# Patient Record
Sex: Male | Born: 1935 | Race: White | Hispanic: No | Smoking: Former smoker
Health system: Southern US, Community
[De-identification: ages and names within clinical notes are randomized; demographics above are authoritative.]

## PROBLEM LIST (undated history)

## (undated) DIAGNOSIS — N3941 Urge incontinence: Secondary | ICD-10-CM

## (undated) DIAGNOSIS — F1011 Alcohol abuse, in remission: Secondary | ICD-10-CM

## (undated) DIAGNOSIS — M109 Gout, unspecified: Secondary | ICD-10-CM

## (undated) DIAGNOSIS — N400 Enlarged prostate without lower urinary tract symptoms: Secondary | ICD-10-CM

## (undated) DIAGNOSIS — M5431 Sciatica, right side: Secondary | ICD-10-CM

## (undated) DIAGNOSIS — H9190 Unspecified hearing loss, unspecified ear: Secondary | ICD-10-CM

## (undated) DIAGNOSIS — K5730 Diverticulosis of large intestine without perforation or abscess without bleeding: Secondary | ICD-10-CM

## (undated) DIAGNOSIS — H3530 Unspecified macular degeneration: Secondary | ICD-10-CM

## (undated) DIAGNOSIS — I10 Essential (primary) hypertension: Secondary | ICD-10-CM

## (undated) DIAGNOSIS — Z9889 Other specified postprocedural states: Secondary | ICD-10-CM

## (undated) DIAGNOSIS — M1990 Unspecified osteoarthritis, unspecified site: Secondary | ICD-10-CM

## (undated) DIAGNOSIS — R195 Other fecal abnormalities: Secondary | ICD-10-CM

## (undated) DIAGNOSIS — Z972 Presence of dental prosthetic device (complete) (partial): Secondary | ICD-10-CM

## (undated) DIAGNOSIS — Z8739 Personal history of other diseases of the musculoskeletal system and connective tissue: Secondary | ICD-10-CM

## (undated) DIAGNOSIS — N289 Disorder of kidney and ureter, unspecified: Secondary | ICD-10-CM

## (undated) DIAGNOSIS — N503 Cyst of epididymis: Secondary | ICD-10-CM

## (undated) DIAGNOSIS — Z85048 Personal history of other malignant neoplasm of rectum, rectosigmoid junction, and anus: Secondary | ICD-10-CM

## (undated) DIAGNOSIS — E785 Hyperlipidemia, unspecified: Secondary | ICD-10-CM

## (undated) DIAGNOSIS — Z8719 Personal history of other diseases of the digestive system: Secondary | ICD-10-CM

## (undated) DIAGNOSIS — I482 Chronic atrial fibrillation, unspecified: Secondary | ICD-10-CM

## (undated) DIAGNOSIS — Z859 Personal history of malignant neoplasm, unspecified: Secondary | ICD-10-CM

## (undated) DIAGNOSIS — K08109 Complete loss of teeth, unspecified cause, unspecified class: Secondary | ICD-10-CM

## (undated) DIAGNOSIS — I4510 Unspecified right bundle-branch block: Secondary | ICD-10-CM

## (undated) DIAGNOSIS — Z85820 Personal history of malignant melanoma of skin: Secondary | ICD-10-CM

## (undated) DIAGNOSIS — L271 Localized skin eruption due to drugs and medicaments taken internally: Secondary | ICD-10-CM

## (undated) DIAGNOSIS — J449 Chronic obstructive pulmonary disease, unspecified: Secondary | ICD-10-CM

## (undated) DIAGNOSIS — R21 Rash and other nonspecific skin eruption: Secondary | ICD-10-CM

## (undated) HISTORY — PX: COLONOSCOPY: SHX174

## (undated) HISTORY — PX: CATARACT EXTRACTION W/ INTRAOCULAR LENS  IMPLANT, BILATERAL: SHX1307

## (undated) HISTORY — DX: Alcohol abuse, in remission: F10.11

## (undated) HISTORY — DX: Gout, unspecified: M10.9

## (undated) HISTORY — PX: OTHER SURGICAL HISTORY: SHX169

## (undated) HISTORY — PX: CARDIAC CATHETERIZATION: SHX172

## (undated) HISTORY — DX: Benign prostatic hyperplasia without lower urinary tract symptoms: N40.0

## (undated) HISTORY — DX: Personal history of other diseases of the digestive system: Z87.19

## (undated) HISTORY — PX: TONSILLECTOMY: SUR1361

## (undated) HISTORY — DX: Rash and other nonspecific skin eruption: R21

---

## 1988-12-19 HISTORY — PX: LAPAROSCOPIC CHOLECYSTECTOMY: SUR755

## 2002-02-06 ENCOUNTER — Encounter: Payer: Self-pay | Admitting: Internal Medicine

## 2004-12-09 ENCOUNTER — Ambulatory Visit: Payer: Self-pay | Admitting: Internal Medicine

## 2005-01-13 ENCOUNTER — Ambulatory Visit: Payer: Self-pay | Admitting: Internal Medicine

## 2005-01-20 ENCOUNTER — Encounter: Admission: RE | Admit: 2005-01-20 | Discharge: 2005-01-20 | Payer: Self-pay | Admitting: Internal Medicine

## 2005-02-26 ENCOUNTER — Ambulatory Visit: Payer: Self-pay | Admitting: Internal Medicine

## 2005-04-17 ENCOUNTER — Ambulatory Visit: Payer: Self-pay | Admitting: Family Medicine

## 2005-05-05 ENCOUNTER — Ambulatory Visit: Payer: Self-pay | Admitting: Internal Medicine

## 2006-01-21 ENCOUNTER — Ambulatory Visit: Payer: Self-pay | Admitting: Internal Medicine

## 2006-05-27 ENCOUNTER — Ambulatory Visit: Payer: Self-pay | Admitting: Internal Medicine

## 2006-11-09 ENCOUNTER — Telehealth: Payer: Self-pay | Admitting: *Deleted

## 2006-12-07 DIAGNOSIS — R32 Unspecified urinary incontinence: Secondary | ICD-10-CM | POA: Insufficient documentation

## 2006-12-07 DIAGNOSIS — Z8719 Personal history of other diseases of the digestive system: Secondary | ICD-10-CM | POA: Insufficient documentation

## 2006-12-07 DIAGNOSIS — I10 Essential (primary) hypertension: Secondary | ICD-10-CM

## 2006-12-07 DIAGNOSIS — N400 Enlarged prostate without lower urinary tract symptoms: Secondary | ICD-10-CM

## 2006-12-07 DIAGNOSIS — J449 Chronic obstructive pulmonary disease, unspecified: Secondary | ICD-10-CM

## 2006-12-07 DIAGNOSIS — K279 Peptic ulcer, site unspecified, unspecified as acute or chronic, without hemorrhage or perforation: Secondary | ICD-10-CM | POA: Insufficient documentation

## 2006-12-08 ENCOUNTER — Telehealth: Payer: Self-pay | Admitting: *Deleted

## 2007-02-09 ENCOUNTER — Ambulatory Visit: Payer: Self-pay | Admitting: Internal Medicine

## 2007-02-09 DIAGNOSIS — M1990 Unspecified osteoarthritis, unspecified site: Secondary | ICD-10-CM

## 2007-02-09 DIAGNOSIS — E785 Hyperlipidemia, unspecified: Secondary | ICD-10-CM | POA: Insufficient documentation

## 2007-06-23 ENCOUNTER — Ambulatory Visit: Payer: Self-pay | Admitting: Internal Medicine

## 2007-06-23 DIAGNOSIS — Z87891 Personal history of nicotine dependence: Secondary | ICD-10-CM | POA: Insufficient documentation

## 2007-09-13 ENCOUNTER — Emergency Department (HOSPITAL_COMMUNITY): Admission: EM | Admit: 2007-09-13 | Discharge: 2007-09-13 | Payer: Self-pay | Admitting: Emergency Medicine

## 2007-09-28 ENCOUNTER — Ambulatory Visit: Payer: Self-pay | Admitting: Internal Medicine

## 2007-09-28 LAB — CONVERTED CEMR LAB
Cholesterol: 195 mg/dL (ref 0–200)
Direct LDL: 147.3 mg/dL
HDL: 27.4 mg/dL — ABNORMAL LOW (ref >39.0)

## 2007-10-04 ENCOUNTER — Ambulatory Visit: Payer: Self-pay | Admitting: Internal Medicine

## 2007-11-16 ENCOUNTER — Ambulatory Visit: Payer: Self-pay | Admitting: Internal Medicine

## 2007-11-16 DIAGNOSIS — K921 Melena: Secondary | ICD-10-CM | POA: Insufficient documentation

## 2007-11-16 LAB — CONVERTED CEMR LAB
BUN: 13 mg/dL (ref 6–23)
Basophils Absolute: 0 10*3/uL (ref 0.0–0.1)
Basophils Relative: 0.1 % (ref 0.0–3.0)
CEA: 5 ng/mL (ref 0.0–5.0)
CO2: 33 meq/L — ABNORMAL HIGH (ref 19–32)
Calcium: 9.9 mg/dL (ref 8.4–10.5)
Chloride: 97 meq/L (ref 96–112)
Creatinine, Ser: 1.3 mg/dL (ref 0.4–1.5)
Eosinophils Absolute: 0.2 10*3/uL (ref 0.0–0.7)
Eosinophils Relative: 3.1 % (ref 0.0–5.0)
GFR calc Af Amer: 68 mL/min
GFR calc non Af Amer: 56 mL/min
Glucose, Bld: 149 mg/dL — ABNORMAL HIGH (ref 70–99)
HCT: 46.7 % (ref 39.0–52.0)
Hemoglobin: 16 g/dL (ref 13.0–17.0)
Lymphocytes Relative: 24.1 % (ref 12.0–46.0)
MCHC: 34.3 g/dL (ref 30.0–36.0)
MCV: 89 fL (ref 78.0–100.0)
Monocytes Absolute: 0.8 10*3/uL (ref 0.1–1.0)
Monocytes Relative: 13.2 % — ABNORMAL HIGH (ref 3.0–12.0)
Neutro Abs: 3.3 10*3/uL (ref 1.4–7.7)
Neutrophils Relative %: 59.5 % (ref 43.0–77.0)
Platelets: 160 10*3/uL (ref 150–400)
Potassium: 4.6 meq/L (ref 3.5–5.1)
RBC: 5.25 M/uL (ref 4.22–5.81)
RDW: 13.2 % (ref 11.5–14.6)
Sodium: 139 meq/L (ref 135–145)
WBC: 5.7 10*3/uL (ref 4.5–10.5)

## 2007-11-30 ENCOUNTER — Ambulatory Visit: Payer: Self-pay | Admitting: Internal Medicine

## 2007-11-30 LAB — CONVERTED CEMR LAB
Basophils Absolute: 0 10*3/uL (ref 0.0–0.1)
Basophils Relative: 0.2 % (ref 0.0–3.0)
Eosinophils Absolute: 0.2 10*3/uL (ref 0.0–0.7)
Eosinophils Relative: 1.8 % (ref 0.0–5.0)
HCT: 47.1 % (ref 39.0–52.0)
Hemoglobin: 16 g/dL (ref 13.0–17.0)
Lymphocytes Relative: 15.9 % (ref 12.0–46.0)
MCHC: 34.1 g/dL (ref 30.0–36.0)
MCV: 90.2 fL (ref 78.0–100.0)
Monocytes Absolute: 1.1 10*3/uL — ABNORMAL HIGH (ref 0.1–1.0)
Monocytes Relative: 11.9 % (ref 3.0–12.0)
Neutro Abs: 6.4 10*3/uL (ref 1.4–7.7)
Neutrophils Relative %: 70.2 % (ref 43.0–77.0)
Platelets: 158 10*3/uL (ref 150–400)
RBC: 5.22 M/uL (ref 4.22–5.81)
RDW: 13.2 % (ref 11.5–14.6)
WBC: 9.1 10*3/uL (ref 4.5–10.5)

## 2007-12-19 ENCOUNTER — Ambulatory Visit: Payer: Self-pay | Admitting: Gastroenterology

## 2007-12-19 DIAGNOSIS — R197 Diarrhea, unspecified: Secondary | ICD-10-CM | POA: Insufficient documentation

## 2007-12-19 DIAGNOSIS — K625 Hemorrhage of anus and rectum: Secondary | ICD-10-CM | POA: Insufficient documentation

## 2008-01-02 ENCOUNTER — Ambulatory Visit: Payer: Self-pay | Admitting: Gastroenterology

## 2008-01-02 ENCOUNTER — Encounter: Payer: Self-pay | Admitting: Gastroenterology

## 2008-01-03 ENCOUNTER — Ambulatory Visit: Payer: Self-pay | Admitting: Cardiology

## 2008-01-03 DIAGNOSIS — N289 Disorder of kidney and ureter, unspecified: Secondary | ICD-10-CM | POA: Insufficient documentation

## 2008-01-04 ENCOUNTER — Ambulatory Visit: Payer: Self-pay | Admitting: Oncology

## 2008-01-04 ENCOUNTER — Ambulatory Visit: Payer: Self-pay | Admitting: Internal Medicine

## 2008-01-04 ENCOUNTER — Encounter: Payer: Self-pay | Admitting: Gastroenterology

## 2008-01-04 DIAGNOSIS — C19 Malignant neoplasm of rectosigmoid junction: Secondary | ICD-10-CM

## 2008-01-05 ENCOUNTER — Encounter: Payer: Self-pay | Admitting: Gastroenterology

## 2008-01-05 ENCOUNTER — Encounter: Payer: Self-pay | Admitting: Internal Medicine

## 2008-01-06 ENCOUNTER — Encounter: Payer: Self-pay | Admitting: Internal Medicine

## 2008-01-06 ENCOUNTER — Ambulatory Visit: Payer: Self-pay | Admitting: Gastroenterology

## 2008-01-12 ENCOUNTER — Ambulatory Visit: Payer: Self-pay | Admitting: Gastroenterology

## 2008-01-12 ENCOUNTER — Ambulatory Visit (HOSPITAL_COMMUNITY): Admission: RE | Admit: 2008-01-12 | Discharge: 2008-01-12 | Payer: Self-pay | Admitting: Gastroenterology

## 2008-01-16 ENCOUNTER — Encounter: Payer: Self-pay | Admitting: Internal Medicine

## 2008-01-18 ENCOUNTER — Encounter: Payer: Self-pay | Admitting: Internal Medicine

## 2008-01-18 ENCOUNTER — Encounter: Payer: Self-pay | Admitting: Gastroenterology

## 2008-01-19 ENCOUNTER — Encounter: Payer: Self-pay | Admitting: Internal Medicine

## 2008-01-20 ENCOUNTER — Ambulatory Visit: Admission: RE | Admit: 2008-01-20 | Discharge: 2008-03-14 | Payer: Self-pay | Admitting: Radiation Oncology

## 2008-01-24 LAB — COMPREHENSIVE METABOLIC PANEL
ALT: 12 U/L (ref 0–53)
Albumin: 4.5 g/dL (ref 3.5–5.2)
Alkaline Phosphatase: 69 U/L (ref 39–117)
Glucose, Bld: 87 mg/dL (ref 70–99)
Potassium: 4.1 mEq/L (ref 3.5–5.3)
Sodium: 136 mEq/L (ref 135–145)
Total Protein: 7.1 g/dL (ref 6.0–8.3)

## 2008-01-24 LAB — CBC WITH DIFFERENTIAL/PLATELET
BASO%: 0.3 % (ref 0.0–2.0)
Eosinophils Absolute: 0.1 10*3/uL (ref 0.0–0.5)
MCHC: 34.3 g/dL (ref 32.0–35.9)
MCV: 88 fL (ref 81.6–98.0)
MONO#: 0.8 10*3/uL (ref 0.1–0.9)
MONO%: 11.2 % (ref 0.0–13.0)
NEUT#: 5 10*3/uL (ref 1.5–6.5)
RBC: 5.2 10*6/uL (ref 4.20–5.71)
RDW: 14.4 % (ref 11.2–14.6)
WBC: 7.1 10*3/uL (ref 4.0–10.0)

## 2008-01-31 LAB — CBC WITH DIFFERENTIAL/PLATELET
BASO%: 0.5 % (ref 0.0–2.0)
Basophils Absolute: 0 10e3/uL (ref 0.0–0.1)
EOS%: 1.4 % (ref 0.0–7.0)
Eosinophils Absolute: 0.1 10e3/uL (ref 0.0–0.5)
HCT: 43.7 % (ref 38.7–49.9)
HGB: 15 g/dL (ref 13.0–17.1)
LYMPH%: 14.9 % (ref 14.0–48.0)
MCH: 30.3 pg (ref 28.0–33.4)
MCHC: 34.3 g/dL (ref 32.0–35.9)
MCV: 88.5 fL (ref 81.6–98.0)
MONO#: 0.7 10e3/uL (ref 0.1–0.9)
MONO%: 11 % (ref 0.0–13.0)
NEUT#: 4.3 10e3/uL (ref 1.5–6.5)
NEUT%: 72.2 % (ref 40.0–75.0)
Platelets: 141 10e3/uL — ABNORMAL LOW (ref 145–400)
RBC: 4.93 10e6/uL (ref 4.20–5.71)
RDW: 14.5 % (ref 11.2–14.6)
WBC: 6 10e3/uL (ref 4.0–10.0)
lymph#: 0.9 10e3/uL (ref 0.9–3.3)

## 2008-02-07 LAB — CBC WITH DIFFERENTIAL/PLATELET
BASO%: 0.5 % (ref 0.0–2.0)
Eosinophils Absolute: 0.1 10*3/uL (ref 0.0–0.5)
LYMPH%: 13 % — ABNORMAL LOW (ref 14.0–48.0)
MCHC: 34.6 g/dL (ref 32.0–35.9)
MONO#: 0.5 10*3/uL (ref 0.1–0.9)
NEUT#: 3.9 10*3/uL (ref 1.5–6.5)
RBC: 4.91 10*6/uL (ref 4.20–5.71)
RDW: 14 % (ref 11.2–14.6)
WBC: 5.2 10*3/uL (ref 4.0–10.0)
lymph#: 0.7 10*3/uL — ABNORMAL LOW (ref 0.9–3.3)

## 2008-02-08 ENCOUNTER — Encounter: Payer: Self-pay | Admitting: Internal Medicine

## 2008-02-15 LAB — CBC WITH DIFFERENTIAL/PLATELET
BASO%: 0.2 % (ref 0.0–2.0)
Eosinophils Absolute: 0.2 10*3/uL (ref 0.0–0.5)
HCT: 42.1 % (ref 38.7–49.9)
LYMPH%: 8.4 % — ABNORMAL LOW (ref 14.0–48.0)
MCHC: 34.3 g/dL (ref 32.0–35.9)
MONO#: 0.6 10*3/uL (ref 0.1–0.9)
NEUT#: 4.2 10*3/uL (ref 1.5–6.5)
NEUT%: 75.9 % — ABNORMAL HIGH (ref 40.0–75.0)
Platelets: 118 10*3/uL — ABNORMAL LOW (ref 145–400)
WBC: 5.6 10*3/uL (ref 4.0–10.0)
lymph#: 0.5 10*3/uL — ABNORMAL LOW (ref 0.9–3.3)

## 2008-02-21 ENCOUNTER — Ambulatory Visit: Payer: Self-pay | Admitting: Oncology

## 2008-02-21 LAB — CBC WITH DIFFERENTIAL/PLATELET
Basophils Absolute: 0 10*3/uL (ref 0.0–0.1)
EOS%: 7.9 % — ABNORMAL HIGH (ref 0.0–7.0)
HCT: 41.2 % (ref 38.7–49.9)
HGB: 14.1 g/dL (ref 13.0–17.1)
MCH: 30.9 pg (ref 28.0–33.4)
NEUT%: 74 % (ref 40.0–75.0)
lymph#: 0.4 10*3/uL — ABNORMAL LOW (ref 0.9–3.3)

## 2008-02-23 ENCOUNTER — Encounter: Payer: Self-pay | Admitting: Internal Medicine

## 2008-02-28 LAB — CBC WITH DIFFERENTIAL/PLATELET
Basophils Absolute: 0 10*3/uL (ref 0.0–0.1)
EOS%: 5.4 % (ref 0.0–7.0)
HCT: 38.6 % — ABNORMAL LOW (ref 38.7–49.9)
HGB: 13.2 g/dL (ref 13.0–17.1)
LYMPH%: 5.2 % — ABNORMAL LOW (ref 14.0–48.0)
MCH: 31.5 pg (ref 28.0–33.4)
MCV: 92.2 fL (ref 81.6–98.0)
MONO%: 13.4 % — ABNORMAL HIGH (ref 0.0–13.0)
NEUT%: 76 % — ABNORMAL HIGH (ref 40.0–75.0)
Platelets: 141 10*3/uL — ABNORMAL LOW (ref 145–400)
RDW: 15.4 % — ABNORMAL HIGH (ref 11.2–14.6)

## 2008-03-01 ENCOUNTER — Encounter: Payer: Self-pay | Admitting: Internal Medicine

## 2008-03-08 ENCOUNTER — Encounter: Payer: Self-pay | Admitting: Internal Medicine

## 2008-03-08 LAB — CBC WITH DIFFERENTIAL/PLATELET
EOS%: 3.6 % (ref 0.0–7.0)
MCH: 31.1 pg (ref 28.0–33.4)
MCHC: 33.8 g/dL (ref 32.0–35.9)
MCV: 92 fL (ref 81.6–98.0)
MONO%: 12.6 % (ref 0.0–13.0)
RBC: 4.22 10*6/uL (ref 4.20–5.71)
RDW: 17.5 % — ABNORMAL HIGH (ref 11.2–14.6)

## 2008-04-18 ENCOUNTER — Inpatient Hospital Stay (HOSPITAL_COMMUNITY): Admission: RE | Admit: 2008-04-18 | Discharge: 2008-04-24 | Payer: Self-pay | Admitting: General Surgery

## 2008-04-18 ENCOUNTER — Encounter (INDEPENDENT_AMBULATORY_CARE_PROVIDER_SITE_OTHER): Payer: Self-pay | Admitting: General Surgery

## 2008-05-03 ENCOUNTER — Encounter: Payer: Self-pay | Admitting: Gastroenterology

## 2008-05-04 ENCOUNTER — Ambulatory Visit: Payer: Self-pay | Admitting: Oncology

## 2008-05-08 ENCOUNTER — Ambulatory Visit: Payer: Self-pay | Admitting: Internal Medicine

## 2008-05-11 ENCOUNTER — Encounter: Payer: Self-pay | Admitting: Internal Medicine

## 2008-05-11 ENCOUNTER — Ambulatory Visit: Payer: Self-pay

## 2008-05-11 HISTORY — PX: TRANSTHORACIC ECHOCARDIOGRAM: SHX275

## 2008-05-15 ENCOUNTER — Telehealth: Payer: Self-pay | Admitting: Internal Medicine

## 2008-06-04 ENCOUNTER — Encounter: Payer: Self-pay | Admitting: Internal Medicine

## 2008-06-04 LAB — CBC WITH DIFFERENTIAL/PLATELET
Basophils Absolute: 0 10*3/uL (ref 0.0–0.1)
EOS%: 1.8 % (ref 0.0–7.0)
HCT: 37.4 % — ABNORMAL LOW (ref 38.7–49.9)
HGB: 12.6 g/dL — ABNORMAL LOW (ref 13.0–17.1)
MCH: 30.1 pg (ref 28.0–33.4)
MCHC: 33.7 g/dL (ref 32.0–35.9)
MCV: 89.4 fL (ref 81.6–98.0)
MONO%: 12.1 % (ref 0.0–13.0)
NEUT%: 63.1 % (ref 40.0–75.0)
RDW: 15.4 % — ABNORMAL HIGH (ref 11.2–14.6)

## 2008-06-04 LAB — COMPREHENSIVE METABOLIC PANEL
AST: 12 U/L (ref 0–37)
Alkaline Phosphatase: 60 U/L (ref 39–117)
BUN: 16 mg/dL (ref 6–23)
Creatinine, Ser: 1.06 mg/dL (ref 0.40–1.50)

## 2008-06-07 ENCOUNTER — Encounter: Payer: Self-pay | Admitting: Internal Medicine

## 2008-07-04 ENCOUNTER — Ambulatory Visit: Payer: Self-pay | Admitting: Oncology

## 2008-07-06 ENCOUNTER — Encounter: Payer: Self-pay | Admitting: Internal Medicine

## 2008-07-06 LAB — CBC WITH DIFFERENTIAL/PLATELET
Basophils Absolute: 0 10*3/uL (ref 0.0–0.1)
EOS%: 2.6 % (ref 0.0–7.0)
HCT: 39.1 % (ref 38.4–49.9)
HGB: 13.3 g/dL (ref 13.0–17.1)
LYMPH%: 22.7 % (ref 14.0–49.0)
MCH: 30 pg (ref 27.2–33.4)
NEUT%: 64.6 % (ref 39.0–75.0)
Platelets: 135 10*3/uL — ABNORMAL LOW (ref 140–400)
lymph#: 1.1 10*3/uL (ref 0.9–3.3)

## 2008-07-19 ENCOUNTER — Encounter: Payer: Self-pay | Admitting: Internal Medicine

## 2008-07-19 LAB — CBC WITH DIFFERENTIAL/PLATELET
BASO%: 0.7 % (ref 0.0–2.0)
Eosinophils Absolute: 0.1 10*3/uL (ref 0.0–0.5)
MCHC: 34.2 g/dL (ref 32.0–36.0)
MONO#: 0.6 10*3/uL (ref 0.1–0.9)
NEUT#: 2.9 10*3/uL (ref 1.5–6.5)
Platelets: 132 10*3/uL — ABNORMAL LOW (ref 140–400)
RBC: 4.33 10*6/uL (ref 4.20–5.82)
WBC: 4.6 10*3/uL (ref 4.0–10.3)
lymph#: 1 10*3/uL (ref 0.9–3.3)

## 2008-08-09 LAB — CBC WITH DIFFERENTIAL/PLATELET
BASO%: 0 % (ref 0.0–2.0)
Basophils Absolute: 0 10*3/uL (ref 0.0–0.1)
Eosinophils Absolute: 0.1 10*3/uL (ref 0.0–0.5)
HCT: 38.7 % (ref 38.4–49.9)
HGB: 13.3 g/dL (ref 13.0–17.1)
LYMPH%: 19.2 % (ref 14.0–49.0)
MCHC: 34.4 g/dL (ref 32.0–36.0)
MONO#: 0.5 10*3/uL (ref 0.1–0.9)
NEUT#: 3.5 10*3/uL (ref 1.5–6.5)
NEUT%: 68 % (ref 39.0–75.0)
Platelets: 128 10*3/uL — ABNORMAL LOW (ref 140–400)
WBC: 5.2 10*3/uL (ref 4.0–10.3)
lymph#: 1 10*3/uL (ref 0.9–3.3)

## 2008-08-31 ENCOUNTER — Ambulatory Visit: Payer: Self-pay | Admitting: Oncology

## 2008-09-04 ENCOUNTER — Encounter: Payer: Self-pay | Admitting: Internal Medicine

## 2008-09-04 LAB — CBC WITH DIFFERENTIAL/PLATELET
Basophils Absolute: 0 10*3/uL (ref 0.0–0.1)
Eosinophils Absolute: 0.1 10*3/uL (ref 0.0–0.5)
HCT: 41.6 % (ref 38.4–49.9)
LYMPH%: 22 % (ref 14.0–49.0)
MONO#: 0.5 10*3/uL (ref 0.1–0.9)
NEUT#: 3.4 10*3/uL (ref 1.5–6.5)
NEUT%: 65.4 % (ref 39.0–75.0)
Platelets: 111 10*3/uL — ABNORMAL LOW (ref 140–400)
WBC: 5.1 10*3/uL (ref 4.0–10.3)
nRBC: 0 % (ref 0–0)

## 2008-11-27 ENCOUNTER — Encounter (INDEPENDENT_AMBULATORY_CARE_PROVIDER_SITE_OTHER): Payer: Self-pay | Admitting: *Deleted

## 2008-12-07 ENCOUNTER — Ambulatory Visit: Payer: Self-pay | Admitting: Internal Medicine

## 2008-12-07 DIAGNOSIS — L570 Actinic keratosis: Secondary | ICD-10-CM | POA: Insufficient documentation

## 2008-12-07 DIAGNOSIS — I4891 Unspecified atrial fibrillation: Secondary | ICD-10-CM | POA: Insufficient documentation

## 2008-12-07 LAB — CONVERTED CEMR LAB
BUN: 24 mg/dL — ABNORMAL HIGH (ref 6–23)
Basophils Absolute: 0 10*3/uL (ref 0.0–0.1)
Basophils Relative: 0 % (ref 0.0–3.0)
CO2: 33 meq/L — ABNORMAL HIGH (ref 19–32)
Calcium: 9.3 mg/dL (ref 8.4–10.5)
Chloride: 107 meq/L (ref 96–112)
Cholesterol, target level: 200 mg/dL
Cholesterol: 200 mg/dL (ref 0–200)
Creatinine, Ser: 1.3 mg/dL (ref 0.4–1.5)
Direct LDL: 138.8 mg/dL
Eosinophils Absolute: 0.1 10*3/uL (ref 0.0–0.7)
Eosinophils Relative: 2.5 % (ref 0.0–5.0)
GFR calc non Af Amer: 56.29 mL/min (ref >60)
Glucose, Bld: 106 mg/dL — ABNORMAL HIGH (ref 70–99)
HCT: 43.9 % (ref 39.0–52.0)
HDL goal, serum: 40 mg/dL
HDL: 32 mg/dL — ABNORMAL LOW (ref >39.00)
Hemoglobin: 14.9 g/dL (ref 13.0–17.0)
LDL Goal: 130 mg/dL
Lymphocytes Relative: 18.7 % (ref 12.0–46.0)
Lymphs Abs: 1 10*3/uL (ref 0.7–4.0)
MCHC: 33.9 g/dL (ref 30.0–36.0)
MCV: 92.1 fL (ref 78.0–100.0)
Monocytes Absolute: 0.6 10*3/uL (ref 0.1–1.0)
Monocytes Relative: 11.2 % (ref 3.0–12.0)
Neutro Abs: 3.7 10*3/uL (ref 1.4–7.7)
Neutrophils Relative %: 67.6 % (ref 43.0–77.0)
Platelets: 119 10*3/uL — ABNORMAL LOW (ref 150.0–400.0)
Potassium: 3.6 meq/L (ref 3.5–5.1)
RBC: 4.77 M/uL (ref 4.22–5.81)
RDW: 12.4 % (ref 11.5–14.6)
Sodium: 143 meq/L (ref 135–145)
WBC: 5.4 10*3/uL (ref 4.5–10.5)

## 2009-01-11 ENCOUNTER — Ambulatory Visit: Payer: Self-pay | Admitting: Gastroenterology

## 2009-01-11 DIAGNOSIS — Z85038 Personal history of other malignant neoplasm of large intestine: Secondary | ICD-10-CM | POA: Insufficient documentation

## 2009-01-23 ENCOUNTER — Encounter: Payer: Self-pay | Admitting: Gastroenterology

## 2009-01-23 ENCOUNTER — Ambulatory Visit: Payer: Self-pay | Admitting: Gastroenterology

## 2009-01-23 LAB — HM COLONOSCOPY

## 2009-01-25 ENCOUNTER — Encounter: Payer: Self-pay | Admitting: Gastroenterology

## 2009-02-15 ENCOUNTER — Ambulatory Visit: Payer: Self-pay | Admitting: Internal Medicine

## 2009-02-20 LAB — CONVERTED CEMR LAB
BUN: 20 mg/dL (ref 6–23)
Basophils Absolute: 0 10*3/uL (ref 0.0–0.1)
Basophils Relative: 0.3 % (ref 0.0–3.0)
CO2: 32 meq/L (ref 19–32)
Calcium: 9.3 mg/dL (ref 8.4–10.5)
Chloride: 100 meq/L (ref 96–112)
Creatinine, Ser: 1.2 mg/dL (ref 0.4–1.5)
Eosinophils Absolute: 0.2 10*3/uL (ref 0.0–0.7)
Eosinophils Relative: 2.6 % (ref 0.0–5.0)
GFR calc non Af Amer: 61.71 mL/min (ref >60)
Glucose, Bld: 95 mg/dL (ref 70–99)
HCT: 45.4 % (ref 39.0–52.0)
Hemoglobin: 15.1 g/dL (ref 13.0–17.0)
Lymphocytes Relative: 22.2 % (ref 12.0–46.0)
Lymphs Abs: 1.4 10*3/uL (ref 0.7–4.0)
MCHC: 33.1 g/dL (ref 30.0–36.0)
MCV: 92.3 fL (ref 78.0–100.0)
Monocytes Absolute: 0.9 10*3/uL (ref 0.1–1.0)
Monocytes Relative: 13.5 % — ABNORMAL HIGH (ref 3.0–12.0)
Neutro Abs: 3.8 10*3/uL (ref 1.4–7.7)
Neutrophils Relative %: 61.4 % (ref 43.0–77.0)
Platelets: 144 10*3/uL — ABNORMAL LOW (ref 150.0–400.0)
Potassium: 4.1 meq/L (ref 3.5–5.1)
RBC: 4.92 M/uL (ref 4.22–5.81)
RDW: 12.9 % (ref 11.5–14.6)
Sodium: 143 meq/L (ref 135–145)
Uric Acid, Serum: 10.1 mg/dL — ABNORMAL HIGH (ref 4.0–7.8)
Vit D, 25-Hydroxy: 13 ng/mL — ABNORMAL LOW (ref 30–89)
WBC: 6.3 10*3/uL (ref 4.5–10.5)

## 2009-03-01 ENCOUNTER — Ambulatory Visit: Payer: Self-pay | Admitting: Oncology

## 2009-03-05 ENCOUNTER — Encounter (INDEPENDENT_AMBULATORY_CARE_PROVIDER_SITE_OTHER): Payer: Self-pay | Admitting: *Deleted

## 2009-03-05 LAB — CBC WITH DIFFERENTIAL/PLATELET
Basophils Absolute: 0 10*3/uL (ref 0.0–0.1)
Eosinophils Absolute: 0.2 10*3/uL (ref 0.0–0.5)
HCT: 43.3 % (ref 38.4–49.9)
HGB: 14.6 g/dL (ref 13.0–17.1)
MCV: 90.7 fL (ref 79.3–98.0)
NEUT#: 3.9 10*3/uL (ref 1.5–6.5)
NEUT%: 62.2 % (ref 39.0–75.0)
RDW: 13.7 % (ref 11.0–14.6)
lymph#: 1.5 10*3/uL (ref 0.9–3.3)

## 2009-08-30 ENCOUNTER — Ambulatory Visit: Payer: Self-pay | Admitting: Oncology

## 2009-09-02 ENCOUNTER — Encounter: Payer: Self-pay | Admitting: Internal Medicine

## 2009-09-02 LAB — CEA: CEA: 1.3 ng/mL (ref 0.0–5.0)

## 2009-11-13 ENCOUNTER — Ambulatory Visit: Payer: Self-pay | Admitting: Family Medicine

## 2009-11-13 DIAGNOSIS — H60339 Swimmer's ear, unspecified ear: Secondary | ICD-10-CM | POA: Insufficient documentation

## 2009-11-13 DIAGNOSIS — H6120 Impacted cerumen, unspecified ear: Secondary | ICD-10-CM | POA: Insufficient documentation

## 2010-02-20 ENCOUNTER — Ambulatory Visit: Payer: Self-pay | Admitting: Oncology

## 2010-03-21 ENCOUNTER — Ambulatory Visit: Payer: Self-pay | Admitting: Internal Medicine

## 2010-03-21 DIAGNOSIS — K4090 Unilateral inguinal hernia, without obstruction or gangrene, not specified as recurrent: Secondary | ICD-10-CM

## 2010-03-21 DIAGNOSIS — C44711 Basal cell carcinoma of skin of unspecified lower limb, including hip: Secondary | ICD-10-CM

## 2010-03-28 ENCOUNTER — Ambulatory Visit: Payer: Self-pay | Admitting: Oncology

## 2010-04-01 ENCOUNTER — Encounter: Payer: Self-pay | Admitting: Gastroenterology

## 2010-04-02 LAB — CEA: CEA: 0.8 ng/mL (ref 0.0–5.0)

## 2010-04-07 ENCOUNTER — Encounter: Payer: Self-pay | Admitting: Internal Medicine

## 2010-04-11 ENCOUNTER — Encounter: Payer: Self-pay | Admitting: Internal Medicine

## 2010-04-22 ENCOUNTER — Encounter: Payer: Self-pay | Admitting: Internal Medicine

## 2010-05-18 LAB — CONVERTED CEMR LAB
BUN: 18 mg/dL (ref 6–23)
Basophils Absolute: 0 10*3/uL (ref 0.0–0.1)
Basophils Relative: 0.1 % (ref 0.0–1.0)
CO2: 31 meq/L (ref 19–32)
Calcium: 9.8 mg/dL (ref 8.4–10.5)
Chloride: 101 meq/L (ref 96–112)
Cholesterol: 212 mg/dL (ref 0–200)
Creatinine, Ser: 1.5 mg/dL (ref 0.4–1.5)
Direct LDL: 160.5 mg/dL
Eosinophils Absolute: 0.1 10*3/uL (ref 0.0–0.6)
Eosinophils Relative: 1.6 % (ref 0.0–5.0)
GFR calc Af Amer: 58 mL/min
GFR calc non Af Amer: 48 mL/min
Glucose, Bld: 112 mg/dL — ABNORMAL HIGH (ref 70–99)
HCT: 49 % (ref 39.0–52.0)
HDL: 26.8 mg/dL — ABNORMAL LOW (ref >39.0)
Hemoglobin: 16.7 g/dL (ref 13.0–17.0)
Lymphocytes Relative: 20.1 % (ref 12.0–46.0)
MCHC: 34.1 g/dL (ref 30.0–36.0)
MCV: 89.9 fL (ref 78.0–100.0)
Monocytes Absolute: 1 10*3/uL — ABNORMAL HIGH (ref 0.2–0.7)
Monocytes Relative: 12.8 % — ABNORMAL HIGH (ref 3.0–11.0)
Neutro Abs: 5 10*3/uL (ref 1.4–7.7)
Neutrophils Relative %: 65.4 % (ref 43.0–77.0)
PSA: 1.52 ng/mL (ref 0.10–4.00)
Platelets: 159 10*3/uL (ref 150–400)
Potassium: 3.8 meq/L (ref 3.5–5.1)
RBC: 5.45 M/uL (ref 4.22–5.81)
RDW: 13 % (ref 11.5–14.6)
Sodium: 140 meq/L (ref 135–145)
Total CHOL/HDL Ratio: 7.9
Triglycerides: 126 mg/dL (ref 0–149)
VLDL: 25 mg/dL (ref 0–40)
WBC: 7.6 10*3/uL (ref 4.5–10.5)

## 2010-05-20 NOTE — Letter (Signed)
[facility]   Imported By: Maryln Gottron 11/04/2009 11:20:55  _____________________________________________________________________  External Attachment:    Type:   Image     Comment:   External Document

## 2010-05-20 NOTE — Assessment & Plan Note (Signed)
Summary: ears clogged/njr   Vital Signs:  Patient profile:   77 year old male Height:      71 inches (180.34 cm) Weight:      167 pounds (75.91 kg) BMI:     23.38 O2 Sat:      96 % on Room air Temp:     97.9 degrees F (36.61 degrees C) oral Pulse rate:   70 / minute BP sitting:   154 / 90  (left arm)  Vitals Entered By: Josph Macho RMA (November 13, 2009 11:42 AM)  O2 Flow:  Room air  Serial Vital Signs/Assessments:  Time      Position  BP       Pulse  Resp  Temp     By                     124/84                         Danise Edge MD  CC: Right Ear clogged and a little sensitive/ CF Is Patient Diabetic? No   History of Present Illness: Patient in today for cerumen. He was in to see the audiologist yesterday and had hearing loss confirmed in both ears. He is awaiting his hearing aides. At that visit they noted some excessive cerumen and he was advised to go home and try and flush his ears with hydrogen peroxide. He flushed but nothing came out and now his ear feels completely clogged on the right and his hearing is even worse. He denies any pain/pruritus/HA/congestion/discharge from ear. No recent illness/fatigue/CP/palp/SOB/GI concerns.  Current Medications (verified): 1)  Metoprolol Tartrate 50 Mg Tabs (Metoprolol Tartrate) .... Once Daily 2)  Triamterene-Hctz 37.5-25 Mg Tabs (Triamterene-Hctz) .... Once Daily 3)  Eql Sleep Aid 25 Mg Tabs (Doxylamine Succinate (Sleep)) .[name].. 1 By Mouth At Bedtime For Sllep 4)  Colchicine 0.6 Mg Tabs (Colchicine) .... One By Mouth Two Times A Day Prn 5)  Allopurinol 100 Mg Tabs (Allopurinol) .[name].. 1 At Bedtime 6)  Vitamin D (Ergocalciferol) 50000 Unit Caps (Ergocalciferol) .[name].. 1 Every Week  Allergies (verified): No Known Drug Allergies  Past History:  Past medical history reviewed for relevance to current acute and chronic problems. Social history (including risk factors) reviewed for relevance to current acute and chronic  problems.  Past Medical History: Reviewed history from 12/19/2007 and no changes required. ETOH Abuse quit drinking in mid 1980s COPD Hypertension Benign prostatic hypertrophy Pancreatitis, hx of Peptic ulcer disease Cholecystitis Urinary incontinence Hyperlipidemia Osteoarthritis hemmorhiods  Social History: Reviewed history from 12/19/2007 and no changes required. Married Current Smoker Alcohol use-no Drug use-no Regular exercise-no has 6 children, 14 grandchildren  Review of Systems      See HPI  Physical Exam  General:  Well-developed,well-nourished,in no acute distress; alert,appropriate and cooperative throughout examination Head:  Normocephalic and atraumatic without obvious abnormalities. Ears:  L ear normal and R cerumen impaction.  wax was flushed out of right ear and skin in canal was noted to be mildly swollen and erythematous Mouth:  Oral mucosa and oropharynx without lesions or exudates. Neck:  No deformities, masses, or tenderness noted. Lungs:  Normal respiratory effort, chest expands symmetrically. Lungs are clear to auscultation, no crackles or wheezes. Heart:  Normal rate and regular rhythm. S1 and S2 normal without gallop, murmur, click, rub or other extra sounds. Abdomen:  Bowel sounds positive,abdomen soft and non-tender without masses, organomegaly or hernias noted. Psych:  Cognition and judgment appear intact. Alert and cooperative with normal attention span and concentration. No apparent delusions, illusions, hallucinations   Impression & Recommendations:  Problem # 1:  CERUMEN IMPACTION, RIGHT (ICD-380.4) R ear irrigated, patient tolerated procedure well. Will proceed with auliology eval and hearing aides. a large amount of wax was expelled.  Problem # 2:  HYPERTENSION (ICD-401.9)  His updated medication list for this problem includes:    Metoprolol Tartrate 50 Mg Tabs (Metoprolol tartrate) ..... Once daily    Triamterene-hctz 37.5-25 Mg  Tabs (Triamterene-hctz) ..... Once daily Repeat BP WNL, no change in meds today  Problem # 3:  OTITIS EXTERNA, ACUTE (ICD-380.12)  His updated medication list for this problem includes:    Cortisporin 3.5-10000-1 Soln (Neomycin-polymyxin-hc) .Marland KitchenMarland KitchenMarland KitchenMarland Kitchen 4 drops right ear three times a day x 7days May start using hearing aide tomorrow  Complete Medication List: 1)  Metoprolol Tartrate 50 Mg Tabs (Metoprolol tartrate) .... Once daily 2)  Triamterene-hctz 37.5-25 Mg Tabs (Triamterene-hctz) .... Once daily 3)  Eql Sleep Aid 25 Mg Tabs (Doxylamine succinate (sleep)) .[name].. 1 by mouth at bedtime for sllep 4)  Colchicine 0.6 Mg Tabs (Colchicine) .... One by mouth two times a day prn 5)  Allopurinol 100 Mg Tabs (Allopurinol) .[name].. 1 at bedtime 6)  Vitamin D (ergocalciferol) 50000 Unit Caps (Ergocalciferol) .[name].. 1 every week 7)  Cortisporin 3.5-10000-1 Soln (Neomycin-polymyxin-hc) .... 4 drops right ear three times a day x 7days  Patient Instructions: 1)  Please schedule a follow-up appointment as needed if symptoms worsen or any concerns. 2)  Apply ear drops three times a day as directed and can start using hearing aide tomorrow in right ear Prescriptions: CORTISPORIN 3.5-10000-1 SOLN (NEOMYCIN-POLYMYXIN-HC) 4 drops right ear three times a day x 7days  #1 bottle x 0   Entered and Authorized by:   Danise Edge MD   Signed by:   Danise Edge MD on 11/13/2009   Method used:   Electronically to        [facility]  [phone]* (retail)       [address]       [place], [place]  [address]       Ph: [phone] or [phone]       Fax: [phone]   RxID:   [id number]

## 2010-05-20 NOTE — Assessment & Plan Note (Signed)
Summary: roa   Vital Signs:  Patient profile:   77 year old male Height:      71 inches Weight:      166 pounds BMI:     23.24 Temp:     98.4 degrees F oral Pulse rate:   72 / minute Resp:     14 per minute BP sitting:   140 / 80  (left arm)  Vitals Entered By: Willy Eddy, LPN (March 21, 2010 11:46 AM) CC: c/o 1-cold 2-groin pain 3-abscess on rt lower leg Is Patient Diabetic? No   Primary Care Ameah Chanda:  [facility]  CC:  c/o 1-cold 2-groin pain 3-abscess on rt lower leg.  History of Present Illness: Has been noting a pain in the right groin that has worsened with a cough that has made his chest sore. Has scalp tenderness and hx of AK has a non healing ulcer on leg that is a skin cancer ( Bcca VS scca) Has a hx of AK. Worsening cough and conjeston form travel... 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) 9)  Diarrhea  (ICD-787.91) 10)  Rectal Bleeding  (ICD-569.3) 11)  Blood in Stool  (ICD-578.1) 12)  Tobacco Use  (ICD-305.1) 13)  Preventive Health Care  (ICD-V70.0) 14)  Osteoarthritis  (ICD-715.90) 15)  Hyperlipidemia  (ICD-272.4) 16)  Urinary Incontinence  (ICD-788.30) 17)  Peptic Ulcer Disease  (ICD-533.90) 18)  Pancreatitis, Hx of  (ICD-V12.70) 19)  Benign Prostatic Hypertrophy  (ICD-600.00) 20)  Hypertension  (ICD-401.9) 21)  COPD  (ICD-496)  Current Problems  (verified): 1)  Otitis Externa, Acute  (ICD-380.12) 2)  Cerumen Impaction, Right  (ICD-380.4) 3)  Adenocarcinoma, Colon, Hx of  (ICD-V10.05) 4)  Actinic Keratosis  (ICD-702.0) 5)  Hx of Atrial Fibrillation  (ICD-427.31) 6)  Unspecified Disorder of Kidney and Ureter  (ICD-593.9) 7)  Unspecified Disorder of Kidney and Ureter  (ICD-593.9) 8)  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 Once Daily 2)  Triamterene-Hctz 37.5-25 Mg Tabs (Triamterene-Hctz) .... Once Daily 3)  Eql Sleep Aid 25 Mg Tabs (Doxylamine Succinate (Sleep)) .[name].. 1 By Mouth At Bedtime For Sllep 4)  Colchicine 0.6 Mg Tabs (Colchicine) .... One By Mouth Two Times A Day Prn 5)  Allopurinol 100 Mg Tabs (Allopurinol) .[name].. 1 At Bedtime 6)  Vitamin D (Ergocalciferol) 50000 Unit Caps (Ergocalciferol) .Marland KitchenMarland KitchenMarland Kitchen  1 Every Week 7)  Cortisporin 3.5-10000-1 Soln (Neomycin-Polymyxin-Hc) .... 4 Drops Right Ear Three Times A Day X 7days  Current Medications (verified): 1)  Metoprolol Tartrate 50 Mg Tabs (Metoprolol Tartrate) .... Once Daily 2)  Triamterene-Hctz 37.5-25 Mg Tabs (Triamterene-Hctz) .... Once Daily 3)  Eql Sleep Aid 25 Mg Tabs (Doxylamine Succinate (Sleep)) .[name].. 1 By Mouth At Bedtime For Sllep 4)  Colchicine 0.6 Mg Tabs (Colchicine) .... One By Mouth Two Times A Day Prn 5)  Allopurinol 100 Mg Tabs (Allopurinol) .[name].. 1 At Bedtime 6)  Vitamin D (Ergocalciferol) 50000 Unit Caps  (Ergocalciferol) .[name].. 1 Every Week 7)  Cortisporin 3.5-10000-1 Soln (Neomycin-Polymyxin-Hc) .... 4 Drops Right Ear Three Times A Day X 7days 8)  Azithromycin 250 Mg Tabs (Azithromycin) .... Two By Mouth Now Then One By Mouth Daily For 4 Aditional Days 9)  Bromatan Plus 45-3.5-30 Mg/66ml Susp (Pse Tan-Dexchlor Tan-Dm Tan) .... Two Tsp By Mouth Two Times A Day For Cough  Allergies (verified): No Known Drug Allergies  Past History:  Family History: Last updated: 11/16/2007 Family History of Lymphoma Family History of Stroke M 1st degree relative <50 no family hx of colon cancer  Social History: Last updated: 12/19/2007 Married Current Smoker Alcohol use-no Drug use-no Regular exercise-no has 6 children, 14 grandchildren  Risk Factors: Exercise: no (12/07/2006)  Risk Factors: Smoking Status: quit (03/21/2010) Packs/Day: 1.5 (03/21/2010)  Past medical, surgical, family and social histories (including risk factors) reviewed, and no changes noted (except as noted below).  Past Medical History: Reviewed history from 12/19/2007 and no changes required. ETOH Abuse quit drinking in mid 1980s COPD Hypertension Benign prostatic hypertrophy Pancreatitis, hx of Peptic ulcer disease Cholecystitis Urinary incontinence Hyperlipidemia Osteoarthritis hemmorhiods  Past Surgical History: Reviewed history from 12/07/2006 and no changes required. Cholecystectomy Tonsillectomy  Family History: Reviewed history from 11/16/2007 and no changes required. Family History of Lymphoma Family History of Stroke M 1st degree relative <50 no family hx of colon cancer  Social History: Reviewed history from 12/19/2007 and no changes required. Married Current Smoker Alcohol use-no Drug use-no Regular exercise-no has 6 children, 14 grandchildren  Review of Systems  The patient denies anorexia, fever, weight loss, weight gain, vision loss, decreased hearing, hoarseness, chest pain,  syncope, dyspnea on exertion, peripheral edema, prolonged cough, headaches, hemoptysis, abdominal pain, melena, hematochezia, severe indigestion/heartburn, hematuria, incontinence, genital sores, muscle weakness, suspicious skin lesions, transient blindness, difficulty walking, depression, unusual weight change, abnormal bleeding, enlarged lymph nodes, angioedema, and breast masses.    Physical Exam  General:  Well-developed,well-nourished,in no acute distress; alert,appropriate and cooperative throughout examination Head:  Normocephalic and atraumatic without obvious abnormalities. Eyes:  pupils equal and pupils round.   Ears:  R ear normal and L ear normal.   Nose:  no external erythema and no nasal discharge.   Mouth:  Oral mucosa and oropharynx without lesions or exudates. Neck:  No deformities, masses, or tenderness noted. Lungs:  normal respiratory effort and R wheezes.   Heart:  normal rate and regular rhythm.   Abdomen:  soft and non-tender.   Genitalia:  left inguinal hernia Msk:  normal ROM and no joint tenderness.   Extremities:  trace left pedal edema and trace right pedal edema.   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 He will be referred to the skin cancer surgery  Orders: Surgical Referral (Surgery)  Problem # 2:  HYPERTENSION (ICD-401.9)  His updated medication list for this problem includes:    Metoprolol Tartrate 50 Mg Tabs (Metoprolol tartrate) ..... Once daily    Triamterene-hctz 37.5-25 Mg Tabs (Triamterene-hctz) ..... Once daily  BP today: 140/80 Prior BP: 154/90 (11/13/2009)  Prior 10 Yr Risk Heart Disease: Not enough information (06/23/2007)  Labs Reviewed: K+: 4.1 (02/15/2009) Creat: : 1.2 (02/15/2009)   Chol: 200 (12/07/2008)   HDL: 32.00  (12/07/2008)   LDL: DEL (02/09/2007)   TG: 126 (02/09/2007)  Problem # 3:  ACUTE BRONCHITIS (ICD-466.0)  post travel and exposure to family  Take antibiotics and other medications as directed. Encouraged to push clear liquids, get enough rest, and take acetaminophen as needed. To be seen in 5-7 days if no improvement, sooner if worse.  His updated medication list for this problem includes:    Azithromycin 250 Mg Tabs (Azithromycin) .[name]..[name] Two by mouth now then one by mouth daily for 4 aditional days    Bromatan Plus 45-3.5-30 Mg/55ml Susp (Pse tan-dexchlor tan-dm tan) .[name]..[name] Two tsp by mouth two times a day for cough  Problem # 4:  INGUINAL HERNIA, RIGHT, SMALL (ICD-550.90) small right indirect hernia that has been worsened bu the cough  controll cough observe  Complete Medication List: 1)  Metoprolol Tartrate 50 Mg Tabs (Metoprolol tartrate) .... Once daily 2)  Triamterene-hctz 37.5-25 Mg Tabs (Triamterene-hctz) .... Once daily 3)  Eql Sleep Aid 25 Mg Tabs (Doxylamine succinate (sleep)) .[name].. 1 by mouth at bedtime for sllep 4)  Colchicine 0.6 Mg Tabs (Colchicine) .... One by mouth two times a day prn 5)  Allopurinol 100 Mg Tabs (Allopurinol) .[name].. 1 at bedtime 6)  Vitamin D (ergocalciferol) 50000 Unit Caps (Ergocalciferol) .[name].. 1 every week 7)  Cortisporin 3.5-10000-1 Soln (Neomycin-polymyxin-hc) .... 4 drops right ear three times a day x 7days 8)  Azithromycin 250 Mg Tabs (Azithromycin) .... Two by mouth now then one by mouth daily for 4 aditional days 9)  Bromatan Plus 45-3.5-30 Mg/28ml Susp (Pse tan-dexchlor tan-dm tan) .... Two tsp by mouth two times a day for cough  Patient Instructions: 1)  Take your antibiotic as prescribed until ALL of it is gone, but stop if you develop a rash or swelling and contact our office as soon as possible. Prescriptions: BROMATAN PLUS 45-3.5-30 MG/5ML SUSP (PSE TAN-DEXCHLOR TAN-DM TAN) two tsp by mouth two times a day for cough  #6 oz x 0   Entered and  Authorized by:   Stacie Glaze MD   Signed by:   Stacie Glaze MD on 03/21/2010   Method used:   Electronically to        [facility]  [phone]* (retail)       [address]       [place], [place]  [address]       Ph: [phone] or [phone]       Fax: [phone]   RxID:   [id number] AZITHROMYCIN 250 MG TABS (AZITHROMYCIN) two by mouth now then one by mouth daily for 4 aditional days  #6 x 0   Entered and Authorized by:   Stacie Glaze MD   Signed by:   Stacie Glaze MD on 03/21/2010   Method used:   Electronically to        [facility]  #  [address]* (retail)       [address]       [place], [place]  [address]       Ph: [phone] or [phone]       Fax: [phone]   RxID:   [id number]    Orders Added: 1)  Est. Patient Level IV [27[address]] 2)  Surgical Referral [Surgery]

## 2010-05-22 NOTE — Letter (Signed)
Summary: Request for Surgical Clearance/[facility]  Request for Surgical Clearance/[facility]   Imported By: Maryln Gottron 04/25/2010 14:31:40  _____________________________________________________________________  External Attachment:    Type:   Image     Comment:   External Document

## 2010-05-22 NOTE — Letter (Signed)
Summary: [facility]  [facility]   Imported By: Lester Arbovale 04/09/2010 12:19:01  _____________________________________________________________________  External Attachment:    Type:   Image     Comment:   External Document

## 2010-05-22 NOTE — Letter (Signed)
Summary: [facility]  [facility]   Imported By: Maryln Gottron 05/09/2010 10:10:43  _____________________________________________________________________  External Attachment:    Type:   Image     Comment:   External Document

## 2010-05-22 NOTE — Letter (Signed)
Summary: [facility]  [facility]   Imported By: Maryln Gottron 05/09/2010 10:14:56  _____________________________________________________________________  External Attachment:    Type:   Image     Comment:   External Document

## 2010-06-02 ENCOUNTER — Other Ambulatory Visit: Payer: Self-pay | Admitting: Internal Medicine

## 2010-06-11 ENCOUNTER — Encounter: Payer: Self-pay | Admitting: Internal Medicine

## 2010-06-12 ENCOUNTER — Encounter: Payer: Self-pay | Admitting: Internal Medicine

## 2010-07-07 ENCOUNTER — Telehealth: Payer: Self-pay | Admitting: Internal Medicine

## 2010-07-07 MED ORDER — TRIAMTERENE-HCTZ 37.5-25 MG PO TABS: 1.0000 | ORAL_TABLET | Freq: Every day | ORAL | Status: DC

## 2010-07-07 NOTE — Telephone Encounter (Signed)
DONE

## 2010-07-07 NOTE — Telephone Encounter (Signed)
Refill Triamterine hcl to [facility].

## 2010-07-14 ENCOUNTER — Telehealth: Payer: Self-pay | Admitting: Internal Medicine

## 2010-07-14 ENCOUNTER — Encounter: Payer: Self-pay | Admitting: Internal Medicine

## 2010-07-14 ENCOUNTER — Ambulatory Visit (INDEPENDENT_AMBULATORY_CARE_PROVIDER_SITE_OTHER): Payer: 59 | Admitting: Internal Medicine

## 2010-07-14 VITALS — BP 134/90 | HR 58 | Temp 98.0°F | Wt 163.0 lb

## 2010-07-14 DIAGNOSIS — R21 Rash and other nonspecific skin eruption: Secondary | ICD-10-CM

## 2010-07-14 MED ORDER — METHYLPREDNISOLONE (PAK) 4 MG PO TABS: 4.0000 mg | ORAL_TABLET | Freq: Every day | ORAL | Status: AC

## 2010-07-14 MED ORDER — VALACYCLOVIR HCL 1 G PO TABS: 1000.0000 mg | ORAL_TABLET | Freq: Three times a day (TID) | ORAL | Status: AC

## 2010-07-14 NOTE — Telephone Encounter (Signed)
done

## 2010-07-14 NOTE — Telephone Encounter (Signed)
Pt called and said that [facility] on [address] did not rcv the script for valACYclovir (VALTREX) 1000 MG tablet. Pls call in asap today.

## 2010-07-21 ENCOUNTER — Other Ambulatory Visit: Payer: Self-pay | Admitting: Internal Medicine

## 2010-07-27 DIAGNOSIS — R21 Rash and other nonspecific skin eruption: Secondary | ICD-10-CM

## 2010-07-27 DIAGNOSIS — Z8619 Personal history of other infectious and parasitic diseases: Secondary | ICD-10-CM | POA: Insufficient documentation

## 2010-07-27 HISTORY — DX: Rash and other nonspecific skin eruption: R21

## 2010-07-27 NOTE — Assessment & Plan Note (Signed)
Appears c/w shingles. Begin medrol dosepak and valtrex. F/u if no improvement or worsening.

## 2010-07-27 NOTE — Progress Notes (Signed)
  Subjective:    Patient ID: William Mahoney, male    DOB: Jul 26, 1925, 77 y.o.   MRN: [id number]  HPI Pt presents to clinic for evaluation of rash. Notes 4d h/o left arm rash. Appears in dermatomal distribution.  Denies associated pain. No exacerbating or alleviating factors. Taking no medication for this problem. Rash is not spreading.  Reviewed pmh, medication and allergies.    Review of Systems  Musculoskeletal: Negative for back pain and arthralgias.  Skin: Positive for color change and rash.  Neurological: Negative for tremors and numbness.       Objective:   Physical Exam  [nursing notereviewed. Constitutional: He appears well-developed and well-nourished. No distress.  HENT:  Head: Normocephalic and atraumatic.  Right Ear: External ear normal.  Left Ear: External ear normal.  Eyes: Conjunctivae are normal.  Neurological: He is alert.  Skin: Skin is warm and dry. He is not diaphoretic.       Erythematous vesicular rash left arm to back. Does not cross midline. Appears in dermatomal distribution.          Assessment & Plan:

## 2010-08-04 LAB — CBC
HCT: 36.3 % — ABNORMAL LOW (ref 39.0–52.0)
Hemoglobin: 12.2 g/dL — ABNORMAL LOW (ref 13.0–17.0)
MCHC: 33.6 g/dL (ref 30.0–36.0)
MCV: 93.7 fL (ref 78.0–100.0)
Platelets: 120 10*3/uL — ABNORMAL LOW (ref 150–400)
RBC: 3.87 MIL/uL — ABNORMAL LOW (ref 4.22–5.81)
RDW: 14.5 % (ref 11.5–15.5)
WBC: 5 10*3/uL (ref 4.0–10.5)

## 2010-08-04 LAB — EYE CULTURE
Culture: NO GROWTH
Culture: NO GROWTH

## 2010-08-04 LAB — BASIC METABOLIC PANEL
BUN: 5 mg/dL — ABNORMAL LOW (ref 6–23)
CO2: 25 mEq/L (ref 19–32)
Calcium: 8 mg/dL — ABNORMAL LOW (ref 8.4–10.5)
Chloride: 103 mEq/L (ref 96–112)
Creatinine, Ser: 0.89 mg/dL (ref 0.4–1.5)
GFR calc Af Amer: >60 mL/min (ref >60)
GFR calc non Af Amer: >60 mL/min (ref >60)
Glucose, Bld: 148 mg/dL — ABNORMAL HIGH (ref 70–99)
Potassium: 3.9 mEq/L (ref 3.5–5.1)
Sodium: 133 mEq/L — ABNORMAL LOW (ref 135–145)

## 2010-08-27 ENCOUNTER — Other Ambulatory Visit: Payer: Self-pay | Admitting: *Deleted

## 2010-08-27 MED ORDER — ALLOPURINOL 100 MG PO TABS: 100.0000 mg | ORAL_TABLET | Freq: Every day | ORAL | Status: DC

## 2010-09-02 NOTE — Discharge Summary (Signed)
NAME:  William Mahoney, William Mahoney              ACCOUNT NO.:  [id number]   MEDICAL RECORD NO.:  [id number]          PATIENT TYPE:  INP   LOCATION:  1526                         FACILITY:  [facility]   PHYSICIAN:  Angelia Mould. Derrell Lolling, M.D.DATE OF BIRTH:  July 15, 1925   DATE OF ADMISSION:  04/18/2008  DATE OF DISCHARGE:  04/24/2008                               DISCHARGE SUMMARY   FINAL DIAGNOSES:  1. 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  2. Tobacco abuse.  3. Questionable history of liver disease.  4. History of peptic ulcer disease.  5. Hypertension.  6. Hyperlipidemia.  7. History of laparoscopic cholecystectomy.  8. History of pancreatitis.  9. Complex lesion left kidney, thought to be chronic and stable      according to urologic evaluation.   OPERATIONS PERFORMED:  1. Exploratory laparotomy.  2. Low anterior resection.  3. Proctoscopy date April 18, 2008.   HISTORY:  This is an 77 year old white man who noticed painless  hematochezia and lost about 15 pounds.  Colonoscopy showed a malignant  mass measuring about 45 cm in diameter, noncircumferential with its  distal edge 12 cm from the anal verge.  The biopsy showed  adenocarcinoma.  I saw the patient in the office and performed a rigid  proctoscopy and found the tumor on the left lateral rectal wall  occupying about 25% of the circumference of the colon with its distal  edge at about 12 to 13 cm.  The patient saw Dr. Larey Dresser because  of the CT scan finding of complex left kidney mass, but Dr. Vonita Moss  found old films and an ultrasound which showed this to be stable and he  felt that this was a low probability for cancer and could be observed.  The patient saw Dr. Lucile Shutters and Dr. Margaretmary Dys and  they elected to proceed with neoadjuvant chemotherapy and neoadjuvant  radiation therapy.  In December 2009 I performed proctoscopy and found  that the tumor had responded nicely  and was 1 cm or less in size noted  in the exact same location.  The patient was counseled and advised to  have surgical intervention which he desired.  The patient underwent a 2-  day bowel prep at home prior to surgery.   HOSPITAL COURSE:  On the day of admission, the patient was taken to the  operating room and underwent abdominal exploration which revealed no  evidence of metastatic disease.  He underwent a low anterior resection  with an EEA stapled anastomosis.  The final pathology report showed  invasive adenocarcinoma being 0.7 cm in maximal dimension.  Pathologic  stage T3 N0.  Margins were negative with the distal margin being 2.5 cm.   Postoperatively the patient did well.  We were concerned about his  tobacco abuse but he never had any pulmonary problems.  He is maintained  on prophylactic beta blockers for cardiac prophylaxis.  His Foley  catheter was removed on January 2.  He voided fairly well.  Bladder scan  showed no retention.  We slowly  advanced his diet and activities and he  was ready for discharge on April 24, 2008.  He had Jackson-Pratt drain  which was removed on January 5.  He was tolerating diet, had several  loose stools.  His abdomen was soft and flat with benign-looking  incision.  I  discussed his pathology report with him.  He was asked to return to see  me in 6-8 days for staple removal.  He is also told to follow up with  Dr. Truett Perna to decide regarding the adjuvant chemotherapy.  Diet and  activities were discussed.  He was given a prescription for analgesics.      Angelia Mould. Derrell Lolling, M.D.  Electronically Signed     HMI/MEDQ  D:  05/18/2008  T:  05/18/2008  Job:  [id number]   cc:   Rachael Fee, MD  [address]  [place], [place] [address]   Leighton Roach Truett Perna, M.D.  Fax: [phone]   Stacie Glaze, MD  [address] [place]  [place] [address]   Maretta Bees. Vonita Moss, M.D.  Fax: [phone]

## 2010-09-02 NOTE — Op Note (Signed)
NAME:  William Mahoney, William Mahoney              ACCOUNT NO.:  [id number]   MEDICAL RECORD NO.:  [id number]          PATIENT TYPE:  INP   LOCATION:  1228                         FACILITY:  [facility]   PHYSICIAN:  Angelia Mould. Derrell Lolling, M.D.DATE OF BIRTH:  1925/05/26   DATE OF PROCEDURE:  04/18/2008  DATE OF DISCHARGE:                               OPERATIVE REPORT   PREOPERATIVE DIAGNOSIS:  Adenocarcinoma of the rectum.   POSTOPERATIVE DIAGNOSIS:  Adenocarcinoma of the rectum and chronic  diverticulitis of the sigmoid colon.   PROCEDURE PERFORMED:  1. Rigid proctoscopy with [place] ink tattooing of rectal cancer.  2. Exploratory laparotomy and low anterior resection with 25 mm EEA      staple anastomosis.  3. Sigmoid colectomy.   SURGEON:  Angelia Mould. Derrell Lolling, M.D.   FIRST ASSISTANT:  Leonie Man, M.D.   OPERATIVE INDICATIONS:  This is an 77 year old white man who has tobacco  abuse, undocumented history of liver disease, history of peptic ulcer  disease.  He presented back in September with a 2 month history of low  volume painless hematochezia and a 15 pound weight loss which he says  was dental problems in which he cannot eat.  He had no pain.  He has had  colonoscopy, CT scan and indirect ultrasound.  He was found to have a 4-  5 cm noncircumferential fungating mass in the mid rectum with the distal  edge 12 cm from the anal verge.  Biopsy showed adenocarcinoma.  Endoscopic ultrasound revealed that the cancer was 7 mm thick and  extended into the muscularis propria layer but no adenopathy was seen.  His ultrasonographic stage was T3 N0.  There was no sign of metastatic  disease on CT scan.  He saw Dr. Truett Perna who felt that he would benefit  from neoadjuvant Xeloda and neoadjuvant radiation therapy.  He has  received all of that and on followup proctoscopy the lesion is again at  12 cm but much smaller and mostly ulcer about 1.5 cm in diameter.  He  has undergone a bowel prep at home.  He is  brought to the operating room  electively.   OPERATIVE FINDINGS:  The patient had a palpable mass about 1.5 cm in  diameter right at or just below the peritoneal reflection.  On  proctoscopy I found the ulcerated mass at 12 cm and injected [place] ink  there.  His sigmoid colon was markedly thickened, presumably from a  chronic diverticulitis.  His liver felt slightly bumpy but was not  grossly abnormal.  I did not see any ascites.  I did not palpate any  adenopathy.  The rest of the colon and small bowel felt normal.  After  we removed the rectal specimen Dr. Jimmy Picket in the lab examined it  and said that he saw the tumor ulcer and that we had a 2.5 cm distal  margin.   OPERATIVE TECHNIQUE:  Following the induction of general endotracheal  anesthesia the patient was placed in a modified dorsal lithotomy  position.  A Foley catheter was inserted.  The patient was identified as  the  correct patient and correct procedure.  Intravenous antibiotics were  given.   Rigid proctoscopy was carried out.  At about 12 cm at most I found the  distal edge of a small ulcer on the left lateral wall of the rectum and  injected [place] ink around this area.  There was no bleeding.   We then prepped and draped the abdomen and perineum in the usual sterile  fashion.  Another time-out was held identifying the correct patient,  correct procedure and surgical checklist.  A lower midline laparotomy  incision was made.  The abdomen was entered through the midline fascia  and explored with findings as described above.  Self-retaining  retractors were placed.  We mobilized the distal descending colon, the  sigmoid colon and the proximal rectum by dividing its lateral peritoneal  attachments.  I noticed that the sigmoid colon was chronically thickened  and had numerous diverticula present.  We divided the peritoneum both on  the right and on the left of the proximal rectum going down across the  sacral  promontory.  We mobilized the colon away from the  retroperitoneum.  We identified both the left ureter and the right  ureter and they were easy to preserve.  We continued the peritoneal  incision all the way down and anterior and around the rectum at the  peritoneal reflection.  We transected the proximal rectum with a GIA  stapling device.  We did a total mesorectal excision taking the  mesorectum all the way down to the presacral fascia and mobilizing it  well down below the peritoneal reflection.  We used blunt dissection  using a sponge stick to mobilize all the mesorectum from the right and  the left pelvic sidewalls.  Some larger vessels were divided with the  LigaSure.  The superior hemorrhoidal vessels were clamped, divided and  doubly ligated with 2-0 silk ties.  As we continued the dissection down  into the pelvis and below the peritoneal reflection we got below the  [place] ink and we could then easily palpate the tumor.  We cleaned the  rectal wall circumferentially of the mesorectum about 3 cm below the  tumor deep in the pelvis.  We placed a Contour stapler about 3 cm distal  to the palpable tumor closed it and fired it and removed it.  We marked  the distal margin with a silk suture.  We sent it to the lab.  Dr. Jimmy Picket opened the specimen and examined it and found the tumor ulcer  and said that we had a 2.5 cm distal margin and several inches of  proximal margin.  We packed the pelvis.  We gave intravenous indigo  carmine which showed up in the urine but there was no leak from either  ureter.   Dr. Lurene Shadow and I examined the sigmoid colon and felt that it was  chronically inflamed and found that we had enough length to resect the  inflamed area and so we divided the sigmoid colon mesentery and resected  back to the proximal sigmoid dividing the proximal sigmoid between [facility]  clamps.  The specimen was sent for routine histology.   We found that we could easily take  the proximal colon down into the  pelvis without any tension whatsoever.  It was pink and healthy and bled  easily.  We brought the sizers to the field and found that the largest  sizer that we could place in the proximal segment was a  25 mm EEA and so  we could not go any larger.  We placed a pursestring suture of 2-0  Prolene in the proximal colon segment.  We inserted the anvil of a 25 mm  EEA stapler and tied the pursestring suture down.  Dr. Lurene Shadow went down  and dilated the rectum and then inserted the 25 mm stapler.  Under  direct vision we were able to position the stapler so that it came  directly through the center of the rectal stump.  Once the spike was  completely deployed I inserted the anvil onto the spike and being  careful not to twist the colon we closed the stapler and held it in  place for about 30 seconds, then fired it, opened it and removed it.  Dr. Lurene Shadow examined and we had two complete doughnut rings.  Dr. Lurene Shadow  then performed a rigid proctoscopy.  He found that the anastomosis was  at 8 cm.  There was no bleeding.  He inflated the colon under tension  and there were no air bubbles.  We felt that this was satisfactory.   We then changed our instruments and gloves and suction devices.  We  irrigated out the lower abdomen and pelvis.  We placed a 17 [place]  drain in the pelvis and brought it out through a left lower quadrant  stab wound and sutured it to the skin with a nylon suture and connected  it to suction bulb.   We discussed whether or not to do a temporary diverting loop ileostomy.  We felt that since the tissues were healthy and that the anastomosis was  above 8 cm and that we had tested the anastomosis and it was airtight  that this was not mandatory so we elected not to do the diverting  ileostomy.   We returned the small bowel and omentum to their anatomic positions.  Hemostasis appeared good.  The midline fascia was closed with running   suture of #1 double-stranded PDS.  The skin was closed with skin  staples.  Clean bandages were placed and the patient taken to the  recovery room in stable condition.  Estimated blood loss was about 300  mL.  Complications none.  Sponge, needle and instrument counts were  correct.      Angelia Mould. Derrell Lolling, M.D.  Electronically Signed     HMI/MEDQ  D:  04/18/2008  T:  04/18/2008  Job:  [id number]   cc:   Leighton Roach. Truett Perna, M.D.  Fax: [phone]   Stacie Glaze, MD  [address] [place]  [place] [address]   Rachael Fee, MD  [address]  [place], [place] [address]   Maretta Bees. Vonita Moss, M.D.  Fax: [phone]

## 2011-01-01 ENCOUNTER — Encounter (HOSPITAL_BASED_OUTPATIENT_CLINIC_OR_DEPARTMENT_OTHER): Payer: 59 | Admitting: Oncology

## 2011-01-01 ENCOUNTER — Other Ambulatory Visit: Payer: Self-pay | Admitting: Oncology

## 2011-01-01 DIAGNOSIS — C20 Malignant neoplasm of rectum: Secondary | ICD-10-CM

## 2011-01-01 DIAGNOSIS — C44721 Squamous cell carcinoma of skin of unspecified lower limb, including hip: Secondary | ICD-10-CM

## 2011-01-01 LAB — CEA: CEA: 0.6 ng/mL (ref 0.0–5.0)

## 2011-01-02 ENCOUNTER — Encounter: Payer: 59 | Admitting: Internal Medicine

## 2011-01-05 ENCOUNTER — Other Ambulatory Visit: Payer: Self-pay | Admitting: Internal Medicine

## 2011-01-06 ENCOUNTER — Ambulatory Visit (INDEPENDENT_AMBULATORY_CARE_PROVIDER_SITE_OTHER): Payer: 59 | Admitting: Internal Medicine

## 2011-01-06 VITALS — BP 136/80 | HR 76 | Temp 98.2°F | Resp 16 | Ht 70.0 in | Wt 163.0 lb

## 2011-01-06 DIAGNOSIS — N411 Chronic prostatitis: Secondary | ICD-10-CM

## 2011-01-06 DIAGNOSIS — I10 Essential (primary) hypertension: Secondary | ICD-10-CM

## 2011-01-06 DIAGNOSIS — M109 Gout, unspecified: Secondary | ICD-10-CM

## 2011-01-06 DIAGNOSIS — Z23 Encounter for immunization: Secondary | ICD-10-CM

## 2011-01-06 DIAGNOSIS — E785 Hyperlipidemia, unspecified: Secondary | ICD-10-CM

## 2011-01-06 DIAGNOSIS — Z0000 Encounter for general adult medical examination without abnormal findings: Secondary | ICD-10-CM

## 2011-01-06 DIAGNOSIS — T887XXA Unspecified adverse effect of drug or medicament, initial encounter: Secondary | ICD-10-CM

## 2011-01-06 LAB — CBC WITH DIFFERENTIAL/PLATELET
Basophils Absolute: 0 10*3/uL (ref 0.0–0.1)
Eosinophils Relative: 3.9 % (ref 0.0–5.0)
Lymphocytes Relative: 18.8 % (ref 12.0–46.0)
Monocytes Relative: 13 % — ABNORMAL HIGH (ref 3.0–12.0)
Neutrophils Relative %: 63.8 % (ref 43.0–77.0)
Platelets: 148 10*3/uL — ABNORMAL LOW (ref 150.0–400.0)
RDW: 15 % — ABNORMAL HIGH (ref 11.5–14.6)
WBC: 5.9 10*3/uL (ref 4.5–10.5)

## 2011-01-06 LAB — TSH: TSH: 3.01 u[IU]/mL (ref 0.35–5.50)

## 2011-01-06 LAB — LIPID PANEL
Cholesterol: 207 mg/dL — ABNORMAL HIGH (ref 0–200)
Total CHOL/HDL Ratio: 6
Triglycerides: 110 mg/dL (ref 0.0–149.0)
VLDL: 22 mg/dL (ref 0.0–40.0)

## 2011-01-06 LAB — BASIC METABOLIC PANEL
CO2: 32 mEq/L (ref 19–32)
Chloride: 102 mEq/L (ref 96–112)
Potassium: 3.8 mEq/L (ref 3.5–5.1)

## 2011-01-06 LAB — PSA: PSA: 1.04 ng/mL (ref 0.10–4.00)

## 2011-01-06 LAB — LDL CHOLESTEROL, DIRECT: Direct LDL: 149.3 mg/dL

## 2011-01-07 ENCOUNTER — Ambulatory Visit (INDEPENDENT_AMBULATORY_CARE_PROVIDER_SITE_OTHER)
Admission: RE | Admit: 2011-01-07 | Discharge: 2011-01-07 | Disposition: A | Discharge status: Home / Self Care | Payer: 59 | Source: Ambulatory Visit | Attending: Internal Medicine | Admitting: Internal Medicine

## 2011-01-07 ENCOUNTER — Telehealth: Payer: Self-pay | Admitting: *Deleted

## 2011-01-07 DIAGNOSIS — M542 Cervicalgia: Secondary | ICD-10-CM

## 2011-01-07 NOTE — Telephone Encounter (Signed)
Notified pt's wife and he will go for xrays.

## 2011-01-07 NOTE — Telephone Encounter (Signed)
Wife is calling stating that pt is having pain at the base of his skull and into his neck when he turns his head.  No known injury, no fever, or any other symptoms.  Asking for advice from Dr. Lovell Sheehan.

## 2011-01-07 NOTE — Telephone Encounter (Signed)
Send him for c spine today Use moist heat to site

## 2011-01-08 ENCOUNTER — Telehealth: Payer: Self-pay | Admitting: *Deleted

## 2011-01-08 DIAGNOSIS — IMO0002 Reserved for concepts with insufficient information to code with codable children: Secondary | ICD-10-CM

## 2011-01-08 MED ORDER — CYCLOBENZAPRINE HCL 10 MG PO TABS: 10.0000 mg | ORAL_TABLET | Freq: Three times a day (TID) | ORAL | Status: DC | PRN

## 2011-01-08 NOTE — Telephone Encounter (Signed)
Pt would like results of C-Spine, and to let Dr. Lovell Sheehan know the neck pain is worse and still cannot turn head.  Asking for advice.

## 2011-01-08 NOTE — Telephone Encounter (Signed)
Per dr Lovell Sheehan- needs mri of c spine and call in flexeril 10 tid prn-pt informed

## 2011-01-10 ENCOUNTER — Emergency Department (HOSPITAL_COMMUNITY): Payer: 59

## 2011-01-10 ENCOUNTER — Inpatient Hospital Stay (HOSPITAL_COMMUNITY)
Admission: EM | Admit: 2011-01-10 | Discharge: 2011-01-12 | DRG: 194 | Disposition: A | Discharge status: Home / Self Care | Payer: 59 | Attending: Internal Medicine | Admitting: Internal Medicine

## 2011-01-10 DIAGNOSIS — E876 Hypokalemia: Secondary | ICD-10-CM | POA: Diagnosis not present

## 2011-01-10 DIAGNOSIS — I10 Essential (primary) hypertension: Secondary | ICD-10-CM | POA: Diagnosis present

## 2011-01-10 DIAGNOSIS — Z8711 Personal history of peptic ulcer disease: Secondary | ICD-10-CM

## 2011-01-10 DIAGNOSIS — E871 Hypo-osmolality and hyponatremia: Secondary | ICD-10-CM | POA: Diagnosis not present

## 2011-01-10 DIAGNOSIS — Z85528 Personal history of other malignant neoplasm of kidney: Secondary | ICD-10-CM

## 2011-01-10 DIAGNOSIS — E785 Hyperlipidemia, unspecified: Secondary | ICD-10-CM | POA: Diagnosis present

## 2011-01-10 DIAGNOSIS — R4182 Altered mental status, unspecified: Secondary | ICD-10-CM | POA: Diagnosis present

## 2011-01-10 DIAGNOSIS — J189 Pneumonia, unspecified organism: Principal | ICD-10-CM | POA: Diagnosis present

## 2011-01-10 DIAGNOSIS — M542 Cervicalgia: Secondary | ICD-10-CM | POA: Diagnosis present

## 2011-01-10 DIAGNOSIS — R509 Fever, unspecified: Secondary | ICD-10-CM | POA: Diagnosis present

## 2011-01-10 LAB — DIFFERENTIAL
Basophils Absolute: 0 10*3/uL (ref 0.0–0.1)
Basophils Relative: 0 % (ref 0–1)
Eosinophils Absolute: 0.1 10*3/uL (ref 0.0–0.7)
Eosinophils Relative: 1 % (ref 0–5)
Lymphocytes Relative: 13 % (ref 12–46)
Monocytes Absolute: 1.4 10*3/uL — ABNORMAL HIGH (ref 0.1–1.0)

## 2011-01-10 LAB — CBC
HCT: 41.5 % (ref 39.0–52.0)
MCHC: 34.9 g/dL (ref 30.0–36.0)
Platelets: 166 10*3/uL (ref 150–400)
RDW: 14.2 % (ref 11.5–15.5)

## 2011-01-10 LAB — POCT I-STAT TROPONIN I: Troponin i, poc: 0 ng/mL (ref 0.00–0.08)

## 2011-01-11 ENCOUNTER — Emergency Department (HOSPITAL_COMMUNITY): Payer: 59

## 2011-01-11 LAB — CSF CELL COUNT WITH DIFFERENTIAL
RBC Count, CSF: 0 /mm3
WBC, CSF: 1 /mm3 (ref 0–5)

## 2011-01-11 LAB — TSH: TSH: 2.779 u[IU]/mL (ref 0.350–4.500)

## 2011-01-11 LAB — COMPREHENSIVE METABOLIC PANEL
Albumin: 3.4 g/dL — ABNORMAL LOW (ref 3.5–5.2)
Alkaline Phosphatase: 68 U/L (ref 39–117)
BUN: 16 mg/dL (ref 6–23)
Potassium: 3.1 mEq/L — ABNORMAL LOW (ref 3.5–5.1)
Total Protein: 7.4 g/dL (ref 6.0–8.3)

## 2011-01-11 LAB — URINALYSIS, ROUTINE W REFLEX MICROSCOPIC
Leukocytes, UA: NEGATIVE
Nitrite: NEGATIVE
Specific Gravity, Urine: 1.017 (ref 1.005–1.030)
pH: 7 (ref 5.0–8.0)

## 2011-01-11 LAB — GLUCOSE, CAPILLARY

## 2011-01-11 LAB — AMMONIA: Ammonia: 12 umol/L (ref 11–60)

## 2011-01-11 LAB — PROTEIN AND GLUCOSE, CSF: Glucose, CSF: 70 mg/dL (ref 43–76)

## 2011-01-11 LAB — GRAM STAIN

## 2011-01-12 ENCOUNTER — Other Ambulatory Visit: Payer: Self-pay | Admitting: *Deleted

## 2011-01-12 ENCOUNTER — Telehealth: Payer: Self-pay | Admitting: *Deleted

## 2011-01-12 LAB — BASIC METABOLIC PANEL
BUN: 14 mg/dL (ref 6–23)
CO2: 31 mEq/L (ref 19–32)
Calcium: 9 mg/dL (ref 8.4–10.5)
Chloride: 101 mEq/L (ref 96–112)
Creatinine, Ser: 0.98 mg/dL (ref 0.50–1.35)
GFR calc Af Amer: >60 mL/min (ref >60)
GFR calc non Af Amer: >60 mL/min (ref >60)
Glucose, Bld: 105 mg/dL — ABNORMAL HIGH (ref 70–99)
Potassium: 4.1 mEq/L (ref 3.5–5.1)
Sodium: 139 mEq/L (ref 135–145)

## 2011-01-12 LAB — URINE CULTURE
Colony Count: 15000
Culture  Setup Time: 201209231126

## 2011-01-12 MED ORDER — HYDROCODONE-ACETAMINOPHEN 5-500 MG PO TABS: 1.0000 | ORAL_TABLET | ORAL | Status: AC | PRN

## 2011-01-12 NOTE — Telephone Encounter (Signed)
Per dr Lovell Sheehan- have  The family call the administrator on call and discuss

## 2011-01-12 NOTE — Telephone Encounter (Addendum)
Asking to speak to [facility].  Pt was admitted with an adverse reaction to Flexeril this weekend, and the nurses tell her they do not know who her MD is?????  Needs to discuss this.  Daughter Koren Shiver?  Per Dr. Lovell Sheehan, have family call the Administrator on call for the day at the hospital.  Rushie Goltz talked to daughter.

## 2011-01-14 ENCOUNTER — Encounter: Payer: Self-pay | Admitting: Internal Medicine

## 2011-01-14 ENCOUNTER — Ambulatory Visit (INDEPENDENT_AMBULATORY_CARE_PROVIDER_SITE_OTHER): Payer: 59 | Admitting: Internal Medicine

## 2011-01-14 DIAGNOSIS — Z23 Encounter for immunization: Secondary | ICD-10-CM

## 2011-01-14 DIAGNOSIS — I10 Essential (primary) hypertension: Secondary | ICD-10-CM

## 2011-01-14 DIAGNOSIS — E785 Hyperlipidemia, unspecified: Secondary | ICD-10-CM

## 2011-01-14 DIAGNOSIS — J449 Chronic obstructive pulmonary disease, unspecified: Secondary | ICD-10-CM

## 2011-01-14 DIAGNOSIS — M542 Cervicalgia: Secondary | ICD-10-CM

## 2011-01-14 LAB — CSF CULTURE W GRAM STAIN: Culture: NO GROWTH

## 2011-01-14 NOTE — Patient Instructions (Signed)
Use ice for 15 minutes to the neck followed by heat at least twice daily

## 2011-01-14 NOTE — Progress Notes (Signed)
  Subjective:    Patient ID: William Mahoney, male    DOB: 1925-11-21, 77 y.o.   MRN: [id number]  HPI The patient was admitted to the hospital after severe neck pain resulted in an admission for evaluation of spinal meningitis.  Subsequent CT scan showed C4-5 anterolisthesis and arthritis. Spinal tap was negative. Chest x-ray showed some scarring at the right base it could be early pneumonia and due to the slightly elevated white count treatment with pneumonia was pursued.  Neck pain has improved quite as stiff as it was prior to his admission but he does have persistent neck discomfort    Review of Systems  Constitutional: Negative for fever and fatigue.  HENT: Negative for hearing loss, congestion, neck pain and postnasal drip.   Eyes: Negative for discharge, redness and visual disturbance.  Respiratory: Negative for cough, shortness of breath and wheezing.   Cardiovascular: Negative for leg swelling.  Gastrointestinal: Negative for abdominal pain, constipation and abdominal distention.  Genitourinary: Negative for urgency and frequency.  Musculoskeletal: Negative for joint swelling and arthralgias.  Skin: Negative for color change and rash.  Neurological: Negative for weakness and light-headedness.  Hematological: Negative for adenopathy.  Psychiatric/Behavioral: Negative for behavioral problems.   Past Medical History  Diagnosis Date  . History of alcohol abuse     quit drinking in the mid 80's  . BPH (benign prostatic hypertrophy)   . History of pancreatitis   . Peptic ulcer disease   . Cholecystitis   . Urine incontinence   . Hyperlipemia   . Osteoarthritis (arthritis due to wear and tear of joints)   . History of hemorrhoids    Past Surgical History  Procedure Date  . Cholecystectomy   . Tonsillectomy     reports that he quit smoking about 2 years ago. He does not have any smokeless tobacco history on file. He reports that he does not drink alcohol or use illicit  drugs. family history includes Lymphoma in an unspecified family member and Stroke in an unspecified family member. No Known Allergies     Objective:   Physical Exam  Nursing note and vitals reviewed. Constitutional: He appears well-developed and well-nourished.  HENT:  Head: Normocephalic and atraumatic.  Eyes: Conjunctivae are normal. Pupils are equal, round, and reactive to light.  Neck: Normal range of motion. Neck supple.  Cardiovascular: Normal rate and regular rhythm.   Pulmonary/Chest: Effort normal and breath sounds normal.  Abdominal: Soft. Bowel sounds are normal.          Assessment & Plan:   cervical strain is the primary etiology is no evidence for a lytic lesion or metastatic disease to the spine I do believe that the work on the floor putting in flooring probably precipitated a pinched nerve in the neck which created much of this pain   I think would be appropriate for safety sake to continue the Avelox to completion for the possible pneumonia since his white count was elevated. he did have a low potassium which we need to follow up with blood work to day

## 2011-01-17 ENCOUNTER — Other Ambulatory Visit: Payer: 59

## 2011-01-17 LAB — CULTURE, BLOOD (ROUTINE X 2)
Culture  Setup Time: 201209231127
Culture: NO GROWTH

## 2011-01-17 NOTE — H&P (Signed)
NAME:  William, Mahoney NO.:  [id number]  MEDICAL RECORD NO.:  [id number]  LOCATION:  WLED                         FACILITY:  [facility]  PHYSICIAN:  Celso Amy, MD   DATE OF BIRTH:  1925-11-30  DATE OF ADMISSION:  01/10/2011 DATE OF DISCHARGE:                             HISTORY & PHYSICAL   PRIMARY CARE PHYSICIAN:  Stacie Glaze, MD  CHIEF COMPLAINT:  Neck pain and shoulder pain.  HISTORY OF PRESENT ILLNESS:  The patient is an 77 year old white male with a past medical history of rectal carcinoma, who presented to ER with a chief complaint of neck and shoulder pain.  History of present illness dates back to 5 days ago when the patient started having neck and shoulder pain.  The patient states that the pain starts from the base of the head and radiates to the shoulder.  The patient went to the PCP and was started on Flexeril.  The patient's family later noticed that the patient was behaving differently, he was incoherent and was having gait abnormalities and not responding appropriately.  So, the patient was brought to the ER.  In the ER, the patient was noted to have a fever of 100.9.  Lumbar puncture was done.  At the time of H and P, the patient's mental status is better than before.  The patient himself does not complain of any fever or chills at home.  No complaint of cough.  No complaint of leg weakness, speech difficulty or vision changes.  No complaint of incontinence of bowel or bladder.  No complaint of recent travel.  No complains of recent head trauma.  No complaint of recent surgery.  REVIEW OF SYSTEMS:  Negative besides the HPI.  ALLERGIES:  The patient has no known drug allergies.  SOCIAL HISTORY:  The patient lives with wife, nonsmoker, nondrinker.  FAMILY HISTORY:  Father died from lymphoma at the age of 60.  Mother died at the age of 63 from stroke.  PAST MEDICAL HISTORY: 1. Positive for history of invasive renal carcinoma for  which the     patient is status post low anterior resection. 2. Status post exploratory laparotomy. 3. History of peptic ulcer disease. 4. Hypertension. 5. Hyperlipidemia. 6. History of pancreatitis. 7. The patient is status post chemotherapy and radiotherapy upon his     cancer.  MEDICATION:  At this time, the patient is on: 1. Allopurinol 100 mg p.o. daily. 2. Triamterene/hydrochlorothiazide 37.5/25 mg p.o. daily. 3. Flexeril 10 mg p.o. q.8 hours. 4. Metoprolol 50 mg p.o. daily.  PHYSICAL EXAMINATION:  VITAL SIGNS:  Blood pressure 129/69, pulse 72, respiratory rate 18, temperature 100.9. GENERAL:  The patient is awake, alert, oriented to time, place and person.  Well-built, well-nourished, resting comfortably on the bed. HEENT:  Pupils equally reactive to light and accommodation.  Extraocular movements are intact.  Head is atraumatic. NECK:  Tender on palpation.  The patient has difficulty moving his neck. RESPIRATORY:  No acute respiratory distress. CHEST:  Clear to auscultation bilaterally. CARDIOVASCULAR:  S1, S2, regular rate and rhythm. GI:  Bowel sounds present.  Abdomen soft, nontender, nondistended. EXTREMITIES:  No lower extremity edema was seen.  No  cyanosis was seen. CNS:  Cranial nerves II through XII are grossly intact.  The patient is moving all 4 extremities.  Strength is normal and 5/5 both upper and lower extremities. PSYCH:  The patient has normal mood and affect.  LABORATORY DATA:  Sodium 130, potassium 3.1, serum chloride 90, bicarb 29, BUN 16, serum creatinine 1, glucose 112.  The patient's WBC 9.3, hemoglobin 14.5, platelets 166.  AST 21, ALT 15, albumin 3.4, calcium 9.5.  Ammonia is 12.  UA was negative.  CSF showed protein of 48, glucose of 7, wbc 1, no rbc's.  Gram stain has only showed rare wbc's. CT of head was done which showed no acute intracranial pathology.  It showed multiple chronic lacunar infarcts in basal ganglia bilaterally. Chest x-ray  was done which showed mild left basilar opacity which reflects atelectasis or scarring.  CT spine was done which showed no evidence of acute fracture or subluxation, only showed mild degenerative changes.  IMPRESSION: 1. Central nervous system:  The patient is being admitted with altered     mental status, fever and neck pain.  This raises the possibility of     meningitis which could be bacterial or viral.  CSF did not show     typical features of bacterial meningitis.  The patient's protein     was minimally high.  This could also be drug induced delirium as     the patient was taking Flexeril 10 mg which is higher than what he     should be per his age. The patient's clinical symptoms have changed.  The patient's mental     status is better than before, so this also could be drug-induced     delirium. 2. Fluid, electrolyte, and nutrition.  The patient was hyponatremic,     this is most likely because of hydrochlorothiazide.  The patient is     also hypokalemic which can also be from hydrochlorothiazide. 3. Hypertension.  The patient's blood pressure is at goal. 4. Deep venous thrombosis.  The patient will need deep venous     thrombosis prophylaxis.  PLAN: 1. We will admit the patient to regular floor. 2. We will continue the patient on IV antibiotics with immunocompromised     status because of  his history, his age of more than 40 and history     of having received  chemotherapy and radiotherapy in the past. 4. We will observe closely. 5. We will ask for Neurology consult in the a.m. if the patient's     condition warrants. 6. We will keep the patient on DVT prophylaxis. 7. We will replete the patient's potassium. 8. We will give the patient IV fluids and follow BMET for sodium     trend. 9. The patient's further recommendation depends on how he does with     this plan.     Celso Amy, MD     MB/MEDQ  D:  01/11/2011  T:  01/11/2011  Job:   [id number]  Electronically Signed by Celso Amy M.D. on 01/17/2011 09:02:43 PM

## 2011-01-21 NOTE — Discharge Summary (Signed)
NAMEMarland Kitchen  William Mahoney, William Mahoney NO.:  [id number]  MEDICAL RECORD NO.:  [id number]  LOCATION:  1513                         FACILITY:  [facility]  PHYSICIAN:  William Mody, MD       DATE OF BIRTH:  [birth year]  DATE OF ADMISSION:  01/10/2011 DATE OF DISCHARGE:  01/12/2011                              DISCHARGE SUMMARY   PRIMARY CARE PHYSICIAN:  William Glaze, MD  DISCHARGE DIAGNOSES: 1. Early pneumonia. 2. Neck pain. 3. History of invasive renal cell carcinoma, status post low anterior     resection, and status post laparotomy. 4. History of peptic ulcer disease. 5. Hypertension. 6. Hyperlipidemia.  DISCHARGE MEDICATIONS: 1. Allopurinol 100 mg p.o. daily. 2. Triamterene/hydrochlorothiazide 37.5/25 mg p.o. daily. 3. Metoprolol 50 mg daily. 4. Avelox 400 mg 1 tablet daily for 5 more days to complete the course     of antibiotic. 5. Tylenol 650 mg p.o. q.4 h p.r.n. 6. Fish oil 1200 mg 1 capsule daily. 7. Vitamin B 1 tablet daily. 8. Multivitamin 1 tablet daily.  PERTINENT LABS:  CBC within normal limits.  Comprehensive metabolic panel when he came in:  Sodium of 130, potassium of 3.1, glucose of 112. Ammonia was 12.  Urinalysis negative for nitrites and leukocytes.  Urine cultures negative.  The LP was done.  LP protein shows a value of 48, glucose within normal limits.  CSF:  WBC count of 1, segmented neutrophils too few to count, RBCs 0.  TSH within normal limits.  Gram- stain:  No organisms seen.  CSF culture:  Negative so far.  Repeat basic metabolic panel on the day of discharge showed a normal sodium, normal potassium, glucose of 105.  Blood culture has been negative so far.  RADIOLOGY: 1. Two-view chest x-ray on January 10, 2011, which showed a mild     left basilar opacity, likely reflects atelectasis versus scarring. 2. CT head without contrast shows no acute intracranial pathology,     multiple chronic lacunar infarcts in the basal ganglia  bilaterally. 3. CT spine without contrast shows (1) no evidence of acute fracture     or subluxation along the cervical spine; (2) mild degenerative     change at the lower cervical spine with minimal grade 1     anterolisthesis of C4 and C5 reflecting facet disease.  BRIEF HOSPITAL COURSE:  77 year old gentleman admitted for altered mental status who has been having neck pain for over 4 weeks came into the PCP's office,  got Flexeril and a flu shot, later was found to be in altered mental status.  He was brought to the ER, found to have a fever of 100.6, underwent an LP to rule out meningitis.  The CSF workup has been negative so far with all the cultures being negative but the chest x-ray showed mild left basilar opacity, hence he is being discharged on p.o. Avelox for 5 more days to complete a course of 7-day antibiotics for possible pneumonia.  Hypertension was controlled well during hospitalization and was continued on his home medications.  Hyponatremia, resolved.  Hypokalemia, repleted.  On the day of discharge, the patient's vital signs showed a temperature of 97.3, pulse of  58, blood pressure 123/57, saturating 98% on room air. General:  He is alert, afebrile, oriented x3, comfortable in no acute distress.  Cardiovascular:  S1, S2 heard.  Respiratory: Chest clear to auscultation bilaterally.  Abdomen:  Soft, nontender, nondistended. Bowel sounds are heard.  Extremities:  No pedal edema.  Neurologic:  No focal deficits.  Infectious Disease doctor, Dr. Lucianne Mahoney, was consulted over the phone regarding stopping the antibiotics which were started for possible meningitis.  Dr. Lucianne Mahoney did agree that his CSF picture and his radiology do not show any sign of meningitis, and the patient also clinically does not appear to have meningitis.  Dr. Darryll Mahoney, the patient's PCP, has been called and updated about the patient's condition and plan and he is in agreement.  The patient at this  time is hemodynamically stable for discharge.  He will be discharged on p.o. Avelox for 5 more days to complete the course of antibiotics.  FOLLOWUP:  He is recommended to follow up with his PCP in about 1 to 2 weeks as needed.          ______________________________ William Mody, MD     VA/MEDQ  D:  01/12/2011  T:  01/12/2011  Job:  [id number]  Electronically Signed by William Mody MD on 01/21/2011 10:27:57 PM

## 2011-01-23 LAB — PROTIME-INR: Prothrombin Time: 14.6 seconds (ref 11.6–15.2)

## 2011-01-23 LAB — COMPREHENSIVE METABOLIC PANEL
AST: 21 U/L (ref 0–37)
Albumin: 3.5 g/dL (ref 3.5–5.2)
BUN: 16 mg/dL (ref 6–23)
Calcium: 9.4 mg/dL (ref 8.4–10.5)
Chloride: 101 mEq/L (ref 96–112)
Creatinine, Ser: 1.18 mg/dL (ref 0.4–1.5)
GFR calc Af Amer: >60 mL/min (ref >60)
Total Bilirubin: 0.9 mg/dL (ref 0.3–1.2)

## 2011-01-23 LAB — CBC
MCHC: 32.9 g/dL (ref 30.0–36.0)
MCHC: 33.8 g/dL (ref 30.0–36.0)
MCV: 93.5 fL (ref 78.0–100.0)
MCV: 94 fL (ref 78.0–100.0)
Platelets: 103 10*3/uL — ABNORMAL LOW (ref 150–400)
Platelets: 151 10*3/uL (ref 150–400)
RDW: 16.2 % — ABNORMAL HIGH (ref 11.5–15.5)

## 2011-01-23 LAB — TYPE AND SCREEN: ABO/RH(D): O POS

## 2011-01-23 LAB — DIFFERENTIAL
Basophils Absolute: 0 10*3/uL (ref 0.0–0.1)
Eosinophils Relative: 1 % (ref 0–5)
Lymphocytes Relative: 14 % (ref 12–46)
Lymphs Abs: 0.9 10*3/uL (ref 0.7–4.0)
Monocytes Absolute: 0.7 10*3/uL (ref 0.1–1.0)
Neutro Abs: 4.3 10*3/uL (ref 1.7–7.7)

## 2011-01-23 LAB — CEA: CEA: 1.9 ng/mL (ref 0.0–5.0)

## 2011-01-23 LAB — BASIC METABOLIC PANEL
BUN: 7 mg/dL (ref 6–23)
CO2: 27 mEq/L (ref 19–32)
Calcium: 7.9 mg/dL — ABNORMAL LOW (ref 8.4–10.5)
Chloride: 103 mEq/L (ref 96–112)
Creatinine, Ser: 0.97 mg/dL (ref 0.4–1.5)
GFR calc Af Amer: >60 mL/min (ref >60)

## 2011-01-23 LAB — APTT: aPTT: 30 seconds (ref 24–37)

## 2011-01-23 LAB — ABO/RH: ABO/RH(D): O POS

## 2011-02-20 ENCOUNTER — Ambulatory Visit: Payer: 59 | Admitting: Internal Medicine

## 2011-02-21 NOTE — Progress Notes (Signed)
System Downtime Recovery The EMR experienced a system downtime.  This downtime occurred on 01-06-2011. During this downtime paper charting was completed by the provider.  The visit was documented on paper during the downtime and will be scanned into [facility], billing was completed by the [facility] Billing Department .  The visit is being closed on behalf of the provider. 

## 2011-04-07 ENCOUNTER — Ambulatory Visit (INDEPENDENT_AMBULATORY_CARE_PROVIDER_SITE_OTHER): Payer: 59 | Admitting: Family Medicine

## 2011-04-07 ENCOUNTER — Encounter: Payer: Self-pay | Admitting: Family Medicine

## 2011-04-07 VITALS — BP 132/88 | Temp 98.4°F | Wt 163.0 lb

## 2011-04-07 DIAGNOSIS — B349 Viral infection, unspecified: Secondary | ICD-10-CM

## 2011-04-07 DIAGNOSIS — B9789 Other viral agents as the cause of diseases classified elsewhere: Secondary | ICD-10-CM

## 2011-04-07 MED ORDER — HYDROCODONE-HOMATROPINE 5-1.5 MG/5ML PO SYRP: 5.0000 mL | ORAL_SOLUTION | Freq: Four times a day (QID) | ORAL | Status: AC | PRN

## 2011-04-07 NOTE — Patient Instructions (Signed)
Follow up promptly for any fever or worsening symptoms 

## 2011-04-07 NOTE — Progress Notes (Signed)
  Subjective:    Patient ID: William Mahoney, male    DOB: Mar 18, 1926, 77 y.o.   MRN: [id number]  HPI  Acute visit. Patient presents with 2 day history of sore throat, watery eyes, nasal congestion, fatigue and dry cough. No body aches. No fever or chills. Patient denies any nausea or vomiting. Mild intermittent headache. No sick exposures. Had flu vaccine earlier this year. Ex-smoker. No dyspnea  Review of Systems As per history of present illness    Objective:   Physical Exam  Constitutional: He appears well-developed and well-nourished.  HENT:  Right Ear: External ear normal.  Left Ear: External ear normal.  Mouth/Throat: Oropharynx is clear and moist.  Neck: Neck supple.  Cardiovascular: Normal rate and regular rhythm.   Pulmonary/Chest: Effort normal and breath sounds normal. No respiratory distress. He has no wheezes. He has no rales.  Lymphadenopathy:    He has no cervical adenopathy.          Assessment & Plan:  Viral syndrome. Hycodan cough syrup 1 teaspoon every 6 hours when necessary for cough. Tylenol for symptom relief. Plenty of fluids. Followup as needed especially if he develops any fever or worsening respiratory symptoms

## 2011-05-08 ENCOUNTER — Ambulatory Visit: Payer: 59 | Admitting: Internal Medicine

## 2011-05-23 ENCOUNTER — Telehealth: Payer: Self-pay | Admitting: Oncology

## 2011-05-23 NOTE — Telephone Encounter (Signed)
lmonvm adviisng the pt of his June 2013 appts °

## 2011-07-10 ENCOUNTER — Ambulatory Visit: Payer: 59 | Admitting: Internal Medicine

## 2011-07-13 ENCOUNTER — Other Ambulatory Visit: Payer: Self-pay | Admitting: Internal Medicine

## 2011-07-24 ENCOUNTER — Ambulatory Visit (INDEPENDENT_AMBULATORY_CARE_PROVIDER_SITE_OTHER): Payer: 59 | Admitting: Family

## 2011-07-24 ENCOUNTER — Encounter: Payer: Self-pay | Admitting: Family

## 2011-07-24 DIAGNOSIS — M79609 Pain in unspecified limb: Secondary | ICD-10-CM

## 2011-07-24 DIAGNOSIS — M79672 Pain in left foot: Secondary | ICD-10-CM

## 2011-07-24 DIAGNOSIS — M109 Gout, unspecified: Secondary | ICD-10-CM

## 2011-07-24 MED ORDER — COLCHICINE 0.6 MG PO TABS: 0.6000 mg | ORAL_TABLET | Freq: Two times a day (BID) | ORAL | Status: DC | PRN

## 2011-07-24 MED ORDER — ALLOPURINOL 100 MG PO TABS: 100.0000 mg | ORAL_TABLET | Freq: Every day | ORAL | Status: DC

## 2011-07-24 MED ORDER — METHYLPREDNISOLONE ACETATE 40 MG/ML INJ SUSP (RADIOLOG
120.0000 mg | Freq: Once | INTRAMUSCULAR | Status: AC
  Administered 2011-07-24: 120 mg via INTRAMUSCULAR

## 2011-07-24 MED ORDER — PREDNISONE 20 MG PO TABS: 40.0000 mg | ORAL_TABLET | Freq: Every day | ORAL | Status: AC

## 2011-07-24 NOTE — Patient Instructions (Signed)
Gout Gout is an inflammatory condition (arthritis) caused by a buildup of uric acid crystals in the joints. Uric acid is a chemical that is normally present in the blood. Under some circumstances, uric acid can form into crystals in your joints. This causes joint redness, soreness, and swelling (inflammation). Repeat attacks are common. Over time, uric acid crystals can form into masses (tophi) near a joint, causing disfigurement. Gout is treatable and often preventable. CAUSES  The disease begins with elevated levels of uric acid in the blood. Uric acid is produced by your body when it breaks down a naturally found substance called purines. This also happens when you eat certain foods such as meats and fish. Causes of an elevated uric acid level include:  Being passed down from parent to child (heredity).   Diseases that cause increased uric acid production (obesity, psoriasis, some cancers).   Excessive alcohol use.   Diet, especially diets rich in meat and seafood.   Medicines, including certain cancer-fighting drugs (chemotherapy), diuretics, and aspirin.   Chronic kidney disease. The kidneys are no longer able to remove uric acid well.   Problems with metabolism.  Conditions strongly associated with gout include:  Obesity.   High blood pressure.   High cholesterol.   Diabetes.  Not everyone with elevated uric acid levels gets gout. It is not understood why some people get gout and others do not. Surgery, joint injury, and eating too much of certain foods are some of the factors that can lead to gout. SYMPTOMS   An attack of gout comes on quickly. It causes intense pain with redness, swelling, and warmth in a joint.   Fever can occur.   Often, only one joint is involved. Certain joints are more commonly involved:   Base of the big toe.   Knee.   Ankle.   Wrist.   Finger.  Without treatment, an attack usually goes away in a few days to weeks. Between attacks, you  usually will not have symptoms, which is different from many other forms of arthritis. DIAGNOSIS  Your caregiver will suspect gout based on your symptoms and exam. Removal of fluid from the joint (arthrocentesis) is done to check for uric acid crystals. Your caregiver will give you a medicine that numbs the area (local anesthetic) and use a needle to remove joint fluid for exam. Gout is confirmed when uric acid crystals are seen in joint fluid, using a special microscope. Sometimes, blood, urine, and X-ray tests are also used. TREATMENT  There are 2 phases to gout treatment: treating the sudden onset (acute) attack and preventing attacks (prophylaxis). Treatment of an Acute Attack  Medicines are used. These include anti-inflammatory medicines or steroid medicines.   An injection of steroid medicine into the affected joint is sometimes necessary.   The painful joint is rested. Movement can worsen the arthritis.   You may use warm or cold treatments on painful joints, depending which works best for you.   Discuss the use of coffee, vitamin C, or cherries with your caregiver. These may be helpful treatment options.  Treatment to Prevent Attacks After the acute attack subsides, your caregiver may advise prophylactic medicine. These medicines either help your kidneys eliminate uric acid from your body or decrease your uric acid production. You may need to stay on these medicines for a very long time. The early phase of treatment with prophylactic medicine can be associated with an increase in acute gout attacks. For this reason, during the first few months   of treatment, your caregiver may also advise you to take medicines usually used for acute gout treatment. Be sure you understand your caregiver's directions. You should also discuss dietary treatment with your caregiver. Certain foods such as meats and fish can increase uric acid levels. Other foods such as dairy can decrease levels. Your caregiver  can give you a list of foods to avoid. HOME CARE INSTRUCTIONS   Do not take aspirin to relieve pain. This raises uric acid levels.   Only take over-the-counter or prescription medicines for pain, discomfort, or fever as directed by your caregiver.   Rest the joint as much as possible. When in bed, keep sheets and blankets off painful areas.   Keep the affected joint raised (elevated).   Use crutches if the painful joint is in your leg.   Drink enough water and fluids to keep your urine clear or pale yellow. This helps your body get rid of uric acid. Do not drink alcoholic beverages. They slow the passage of uric acid.   Follow your caregiver's dietary instructions. Pay careful attention to the amount of protein you eat. Your daily diet should emphasize fruits, vegetables, whole grains, and fat-free or low-fat milk products.   Maintain a healthy body weight.  SEEK MEDICAL CARE IF:   You have an oral temperature above 102 F (38.9 C).   You develop diarrhea, vomiting, or any side effects from medicines.   You do not feel better in 24 hours, or you are getting worse.  SEEK IMMEDIATE MEDICAL CARE IF:   Your joint becomes suddenly more tender and you have:   Chills.   An oral temperature above 102 F (38.9 C), not controlled by medicine.  MAKE SURE YOU:   Understand these instructions.   Will watch your condition.   Will get help right away if you are not doing well or get worse.  Document Released: 04/03/2000 Document Revised: 03/26/2011 Document Reviewed: 07/15/2009 [employer] Patient Information 2012 [employer]. 

## 2011-07-24 NOTE — Progress Notes (Signed)
Subjective:    Patient ID: William Mahoney, male    DOB: 05/13/1925, 78 y.o.   MRN: [id number]  HPI Comments: C/o lt foot gout flare up x three days. Pain described as "rock pressing against lt foot throbbing." Pain gets worse with ambulation 10/10 and is currently 7/10 unrelieved with OTC tylenol. Takes allopurinol and has not taken colchicine in over one year.      Review of Systems  Respiratory: Negative.   Cardiovascular: Negative.   Musculoskeletal: Positive for arthralgias and gait problem. Negative for myalgias and joint swelling.       Increased pain with ambulation   Past Medical History  Diagnosis Date  . History of alcohol abuse     quit drinking in the mid 80's  . BPH (benign prostatic hypertrophy)   . History of pancreatitis   . Peptic ulcer disease   . Cholecystitis   . Urine incontinence   . Hyperlipemia   . Osteoarthritis (arthritis due to wear and tear of joints)   . History of hemorrhoids     History   Social History  . Marital Status: Married    Spouse Name: N/A    Number of Children: N/A  . Years of Education: N/A   Occupational History  . Not on file.   Social History Main Topics  . Smoking status: Former Smoker    Quit date: 03/20/2008  . Smokeless tobacco: Not on file  . Alcohol Use: No  . Drug Use: No  . Sexually Active: Yes   Other Topics Concern  . Not on file   Social History Narrative  . No narrative on file    Past Surgical History  Procedure Date  . Cholecystectomy   . Tonsillectomy     Family History  Problem Relation Age of Onset  . Lymphoma    . Stroke      1st degree relative    Allergies  Allergen Reactions  . Flexeril (Cyclobenzaprine Hcl)     The 10 mg dose   caused altered mental status    Current Outpatient Prescriptions on File Prior to Visit  Medication Sig Dispense Refill  . Doxylamine Succinate, Sleep, (SLEEP AID) 25 MG tablet Take 25 mg by mouth at bedtime as needed.        [name]  HYDROcodone-acetaminophen (VICODIN) 5-500 MG per tablet Take 1 tablet by mouth every 4 (four) hours as needed for pain.  20 tablet  0  . metoprolol (LOPRESSOR) 50 MG tablet TAKE ONE TABLET BY MOUTH EVERY DAY  30 tablet  11  . triamterene-hydrochlorothiazide (MAXZIDE-25) 37.5-25 MG per tablet TAKE ONE TABLET BY MOUTH EVERY DAY  30 tablet  0  . Vitamin D, Ergocalciferol, (DRISDOL) 50000 UNITS CAPS TAKE ONE CAPSULE BY MOUTH ONCE A WEEK  30 capsule  3  . DISCONTD: allopurinol (ZYLOPRIM) 100 MG tablet Take 1 tablet (100 mg total) by mouth daily.  30 tablet  6  . DISCONTD: colchicine 0.6 MG tablet Take 0.6 mg by mouth 2 (two) times daily as needed.          BP 126/80  Temp(Src) 98.6 F (37 C) (Oral)  Wt 166 lb (75.297 kg)chart     Objective:   Physical Exam  Constitutional: He is oriented to person, place, and time. He appears well-developed and well-nourished. No distress.  Cardiovascular: Normal rate, regular rhythm, normal heart sounds and intact distal pulses.  Exam reveals no gallop and no friction rub.   No murmur heard. Pulmonary/Chest: Effort  normal and breath sounds normal. No respiratory distress. He has no wheezes. He has no rales. He exhibits no tenderness.  Neurological: He is alert and oriented to person, place, and time.  Skin: Skin is warm and dry. No rash noted. He is not diaphoretic. There is erythema.             Assessment & Plan:  Assessment: Lt foot gout, pain Plan: Depo-medrol, teaching handouts provided on diagnosis and treatments. Encouraged to RTC if s/s get worse

## 2011-08-15 ENCOUNTER — Other Ambulatory Visit: Payer: Self-pay | Admitting: Internal Medicine

## 2011-08-27 ENCOUNTER — Ambulatory Visit: Payer: 59 | Admitting: Internal Medicine

## 2011-08-28 ENCOUNTER — Encounter: Payer: Self-pay | Admitting: Internal Medicine

## 2011-08-28 ENCOUNTER — Ambulatory Visit (INDEPENDENT_AMBULATORY_CARE_PROVIDER_SITE_OTHER): Payer: 59 | Admitting: Internal Medicine

## 2011-08-28 VITALS — BP 110/76 | HR 72 | Temp 98.3°F | Resp 16 | Ht 71.0 in | Wt 164.0 lb

## 2011-08-28 DIAGNOSIS — M1A00X0 Idiopathic chronic gout, unspecified site, without tophus (tophi): Secondary | ICD-10-CM

## 2011-08-28 DIAGNOSIS — I10 Essential (primary) hypertension: Secondary | ICD-10-CM

## 2011-08-28 LAB — URIC ACID: Uric Acid, Serum: 7.7 mg/dL (ref 4.0–7.8)

## 2011-08-28 MED ORDER — METHYLPREDNISOLONE (PAK) 4 MG PO TABS: ORAL_TABLET | ORAL | Status: AC

## 2011-08-28 MED ORDER — TRIAMTERENE-HCTZ 37.5-25 MG PO TABS: 0.5000 | ORAL_TABLET | Freq: Every day | ORAL | Status: DC

## 2011-08-28 NOTE — Patient Instructions (Signed)
Cut the Maxzide in half

## 2011-08-28 NOTE — Progress Notes (Signed)
  Subjective:    Patient ID: William Mahoney, male    DOB: June 07, 1925, 78 y.o.   MRN: [id number]  HPI  Flair of gout Primarily in his right foot. He has used colchicine in the past for treatment of his gouty attacks he is not on nonsteroidals due to history of GI bleed.  He is on allopurenoll low dose 100 mg by mouth daily  He has history of hypertension  Review of Systems  Constitutional: Negative for fever and fatigue.  HENT: Negative for hearing loss, congestion, neck pain and postnasal drip.   Eyes: Negative for discharge, redness and visual disturbance.  Respiratory: Negative for cough, shortness of breath and wheezing.   Cardiovascular: Negative for leg swelling.  Gastrointestinal: Negative for abdominal pain, constipation and abdominal distention.  Genitourinary: Negative for urgency and frequency.  Musculoskeletal: Negative for joint swelling and arthralgias.  Skin: Negative for color change and rash.  Neurological: Negative for weakness and light-headedness.  Hematological: Negative for adenopathy.  Psychiatric/Behavioral: Negative for behavioral problems.       Objective:   Physical Exam  Constitutional: He appears well-developed and well-nourished.  HENT:  Head: Normocephalic and atraumatic.  Eyes: Conjunctivae are normal. Pupils are equal, round, and reactive to light.  Neck: Normal range of motion. Neck supple.  Cardiovascular: Normal rate and regular rhythm.   Pulmonary/Chest: Effort normal and breath sounds normal.  Abdominal: Soft. Bowel sounds are normal.          Assessment & Plan:  Acute gouty attack recommend colchicine 1 by mouth twice a day for at least 2 weeks then daily.  We will consider increasing his allopurinol  From 100-300 mg by mouth each bedtime

## 2011-09-11 ENCOUNTER — Telehealth: Payer: Self-pay | Admitting: Oncology

## 2011-09-11 NOTE — Telephone Encounter (Signed)
called pt and r/s appt on 06/14 to 06/17

## 2011-10-02 ENCOUNTER — Ambulatory Visit: Payer: 59 | Admitting: Oncology

## 2011-10-02 ENCOUNTER — Other Ambulatory Visit: Payer: 59 | Admitting: Lab

## 2011-10-05 ENCOUNTER — Telehealth: Payer: Self-pay | Admitting: Oncology

## 2011-10-05 ENCOUNTER — Ambulatory Visit (HOSPITAL_BASED_OUTPATIENT_CLINIC_OR_DEPARTMENT_OTHER): Payer: 59 | Admitting: Oncology

## 2011-10-05 ENCOUNTER — Other Ambulatory Visit (HOSPITAL_BASED_OUTPATIENT_CLINIC_OR_DEPARTMENT_OTHER): Payer: 59 | Admitting: Lab

## 2011-10-05 VITALS — BP 115/74 | HR 63 | Temp 97.3°F | Ht 71.0 in | Wt 165.1 lb

## 2011-10-05 DIAGNOSIS — C44721 Squamous cell carcinoma of skin of unspecified lower limb, including hip: Secondary | ICD-10-CM

## 2011-10-05 DIAGNOSIS — C19 Malignant neoplasm of rectosigmoid junction: Secondary | ICD-10-CM

## 2011-10-05 DIAGNOSIS — C20 Malignant neoplasm of rectum: Secondary | ICD-10-CM

## 2011-10-05 NOTE — Progress Notes (Signed)
   [facility]    OFFICE PROGRESS NOTE   INTERVAL HISTORY:   He returns as scheduled. He feels well. He reports a recent flare of gout in the feet.  Sandler has a urinary urgency. No other complaint.  Objective:  Vital signs in last 24 hours:  Blood pressure 115/74, pulse 63, temperature 97.3 F (36.3 C), temperature source Oral, height 5\' 11"  (1.803 m), weight 165 lb 1.6 oz (74.889 kg).    HEENT: Neck without mass Lymphatics: No cervical, supraclavicular, axillary, or inguinal nodes Resp: Lungs clear bilaterally Cardio: Bradycardia, regular rate and rhythm with an occasional pulse GI: No hepatosplenomegaly, no mass Vascular: No leg edema   Lab Results: CEA pending   Medications: I have reviewed the patient's current medications.  Assessment/Plan: 1. Clinical stage II (T3 N0) rectal cancer diagnosed in September 2009, status post neoadjuvant radiation and Xeloda chemotherapy.  Xeloda was discontinued 1 week prematurely due to cutaneous toxicity. 2. Status post low anterior resection April 18, 2008 confirming a pathologic T3 N0 tumor with negative surgical margins.  He completed 5 of 6 planned cycles of "adjuvant" Xeloda chemotherapy and then declined further chemotherapy. 3. History of hand-foot syndrome secondary to Xeloda. 4. History of a skin rash secondary to Xeloda. 5. History of increased tearing and rhinorrhea secondary to Xeloda. 6. History of thrombocytopenia secondary to chemotherapy and radiation. 7. Indeterminate left renal mass, status post urology evaluation by Dr. Vonita Moss. 8. Chronic obstructive pulmonary disease. 9. Alcoholic cirrhosis.  10. History of hypertension 11. History of a cholecystectomy. 12. History of osteoarthritis. 13. Benign prostatic hypertrophy.  14. History of atrial fibrillation. 15. 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 16. Status post colonoscopy 01/23/2009.  Rectal anastomosis at 10 cm from  the anus, edematous mucosa without any suggestion of recurrent tumor; moderate diverticulosis, predominantly in the left colon; diminutive polyp in the proximal transverse colon which was removed; otherwise normal examination.  Repeat colonoscopy recommended in 3 years.    Disposition:  He remains in clinical remission from rectal cancer. He is due for a colonoscopy in October of 2013. Mr. Haisley will return for an office visit and CEA in 9 months. We will followup on the CEA from today.   Thornton Papas, MD  10/05/2011  11:44 AM

## 2011-10-05 NOTE — Telephone Encounter (Signed)
appts made and printed for pt aom °

## 2011-10-09 ENCOUNTER — Telehealth: Payer: Self-pay

## 2011-10-09 ENCOUNTER — Telehealth: Payer: Self-pay | Admitting: *Deleted

## 2011-10-09 NOTE — Telephone Encounter (Signed)
This was the message given to pt. by Terri RN 

## 2011-10-09 NOTE — Telephone Encounter (Signed)
Message copied by Sabino Snipes on Fri Oct 09, 2011  4:45 PM ------      Message from: Thornton Papas B      Created: Wed Oct 07, 2011  9:06 PM       Please call patient, cea is normal f/u as scheduled

## 2011-10-09 NOTE — Telephone Encounter (Signed)
Spoke with patient - gave results and instructions to follow up as scheduled.

## 2011-11-30 ENCOUNTER — Encounter: Payer: Self-pay | Admitting: Internal Medicine

## 2011-11-30 ENCOUNTER — Ambulatory Visit (INDEPENDENT_AMBULATORY_CARE_PROVIDER_SITE_OTHER): Payer: 59 | Admitting: Internal Medicine

## 2011-11-30 VITALS — BP 140/74 | HR 72 | Temp 98.3°F | Resp 16 | Ht 70.0 in | Wt 164.0 lb

## 2011-11-30 DIAGNOSIS — I10 Essential (primary) hypertension: Secondary | ICD-10-CM

## 2011-11-30 DIAGNOSIS — I4891 Unspecified atrial fibrillation: Secondary | ICD-10-CM

## 2011-11-30 DIAGNOSIS — D649 Anemia, unspecified: Secondary | ICD-10-CM

## 2011-11-30 DIAGNOSIS — N400 Enlarged prostate without lower urinary tract symptoms: Secondary | ICD-10-CM

## 2011-11-30 DIAGNOSIS — C189 Malignant neoplasm of colon, unspecified: Secondary | ICD-10-CM

## 2011-11-30 DIAGNOSIS — C44711 Basal cell carcinoma of skin of unspecified lower limb, including hip: Secondary | ICD-10-CM

## 2011-11-30 DIAGNOSIS — M109 Gout, unspecified: Secondary | ICD-10-CM

## 2011-11-30 LAB — BASIC METABOLIC PANEL
Calcium: 9.5 mg/dL (ref 8.4–10.5)
Chloride: 101 mEq/L (ref 96–112)
Creatinine, Ser: 1.3 mg/dL (ref 0.4–1.5)
Sodium: 138 mEq/L (ref 135–145)

## 2011-11-30 LAB — CBC WITH DIFFERENTIAL/PLATELET
Basophils Relative: 0.5 % (ref 0.0–3.0)
Eosinophils Relative: 2.2 % (ref 0.0–5.0)
Lymphocytes Relative: 15.3 % (ref 12.0–46.0)
Monocytes Relative: 11.9 % (ref 3.0–12.0)
Neutrophils Relative %: 70.1 % (ref 43.0–77.0)
RBC: 4.69 Mil/uL (ref 4.22–5.81)
WBC: 5.2 10*3/uL (ref 4.5–10.5)

## 2011-11-30 LAB — URIC ACID: Uric Acid, Serum: 7.6 mg/dL (ref 4.0–7.8)

## 2011-11-30 NOTE — Progress Notes (Signed)
Subjective:    Patient ID: William Mahoney, male    DOB: [birth year], 78 y.o.   MRN: [id number]  HPI Blood pressure stable  Colon stable Hernia repair pain Working on flooring Heart rate... Stable mild chest "ache" but no pressure and not related to exertion.    Review of Systems  Constitutional: Negative for fever and fatigue.  HENT: Negative for hearing loss, congestion, neck pain and postnasal drip.   Eyes: Negative for discharge, redness and visual disturbance.  Respiratory: Negative for cough, shortness of breath and wheezing.   Cardiovascular: Negative for leg swelling.  Gastrointestinal: Negative for abdominal pain, constipation and abdominal distention.  Genitourinary: Negative for urgency and frequency.  Musculoskeletal: Negative for joint swelling and arthralgias.  Skin: Negative for color change and rash.  Neurological: Negative for weakness and light-headedness.  Hematological: Negative for adenopathy.  Psychiatric/Behavioral: Negative for behavioral problems.   Past Medical History  Diagnosis Date  . History of alcohol abuse     quit drinking in the mid 80's  . BPH (benign prostatic hypertrophy)   . History of pancreatitis   . Peptic ulcer disease   . Cholecystitis   . Urine incontinence   . Hyperlipemia   . Osteoarthritis (arthritis due to wear and tear of joints)   . History of hemorrhoids     History   Social History  . Marital Status: Married    Spouse Name: N/A    Number of Children: N/A  . Years of Education: N/A   Occupational History  . Not on file.   Social History Main Topics  . Smoking status: Former Smoker    Quit date: 03/20/2008  . Smokeless tobacco: Not on file  . Alcohol Use: No  . Drug Use: No  . Sexually Active: Yes   Other Topics Concern  . Not on file   Social History Narrative  . No narrative on file    Past Surgical History  Procedure Date  . Cholecystectomy   . Tonsillectomy     Family History  Problem  Relation Age of Onset  . Lymphoma    . Stroke      1st degree relative    Allergies  Allergen Reactions  . Flexeril (Cyclobenzaprine Hcl)     The 10 mg dose   caused altered mental status    Current Outpatient Prescriptions on File Prior to Visit  Medication Sig Dispense Refill  . allopurinol (ZYLOPRIM) 100 MG tablet Take 1 tablet (100 mg total) by mouth daily.  30 tablet  6  . colchicine 0.6 MG tablet Take 1 tablet (0.6 mg total) by mouth 2 (two) times daily as needed.  60 tablet  1  . fish oil-omega-3 fatty acids 1000 MG capsule Take 1 g by mouth daily.      [name] HYDROcodone-acetaminophen (VICODIN) 5-500 MG per tablet Take 1 tablet by mouth every 4 (four) hours as needed for pain.  20 tablet  0  . metoprolol (LOPRESSOR) 50 MG tablet TAKE ONE TABLET BY MOUTH EVERY DAY  30 tablet  11  . Multiple Vitamin (MULTIVITAMIN) tablet Take 1 tablet by mouth daily.      [name] triamterene-hydrochlorothiazide (MAXZIDE-25) 37.5-25 MG per tablet Take 0.5 each (0.5 tablets total) by mouth daily.  30 tablet  11  . Vitamin D, Ergocalciferol, (DRISDOL) 50000 UNITS CAPS TAKE ONE CAPSULE BY MOUTH ONCE A WEEK  30 capsule  3    BP 140/74  Pulse 72  Temp 98.3 F (36.8 C)  Resp  16  Ht 5\' 10"  (1.778 m)  Wt 164 lb (74.39 kg)  BMI 23.53 kg/m2        Objective:   Physical Exam  Nursing note and vitals reviewed. Constitutional: He appears well-developed and well-nourished.  HENT:  Head: Normocephalic and atraumatic.  Eyes: Conjunctivae are normal. Pupils are equal, round, and reactive to light.  Neck: Normal range of motion. Neck supple.  Cardiovascular: Normal rate and regular rhythm.   Pulmonary/Chest: Effort normal and breath sounds normal.  Abdominal: Soft. Bowel sounds are normal.          Assessment & Plan:  Has a history of atrial fibrillation and some intermittent chest discomfort it is not exertional in etiology and today's in sinus rhythm.  A history of COPD but lung fields are clear we  will measure a CBC with differential today as well as a basic metabolic to check his potassium on antihypertensive medications.  He has nocturia one episode at night with a history of BPH has a history of adenocarcinoma of the colon therefore we will monitor a CEA

## 2011-11-30 NOTE — Patient Instructions (Signed)
The patient is instructed to continue all medications as prescribed. Schedule followup with check out clerk upon leaving the clinic  

## 2011-12-01 LAB — CEA: CEA: 1.2 ng/mL (ref 0.0–5.0)

## 2012-01-06 ENCOUNTER — Encounter: Payer: Self-pay | Admitting: Gastroenterology

## 2012-01-27 ENCOUNTER — Other Ambulatory Visit: Payer: Self-pay | Admitting: Internal Medicine

## 2012-02-04 ENCOUNTER — Other Ambulatory Visit: Payer: Self-pay | Admitting: Internal Medicine

## 2012-05-16 ENCOUNTER — Encounter: Payer: Self-pay | Admitting: Gastroenterology

## 2012-05-22 ENCOUNTER — Other Ambulatory Visit: Payer: Self-pay

## 2012-05-22 ENCOUNTER — Encounter (HOSPITAL_COMMUNITY): Payer: Self-pay

## 2012-05-22 ENCOUNTER — Emergency Department (HOSPITAL_COMMUNITY)
Admission: EM | Admit: 2012-05-22 | Discharge: 2012-05-22 | Disposition: A | Discharge status: Home / Self Care | Payer: Medicare Other | Attending: Emergency Medicine | Admitting: Emergency Medicine

## 2012-05-22 DIAGNOSIS — Z8679 Personal history of other diseases of the circulatory system: Secondary | ICD-10-CM | POA: Insufficient documentation

## 2012-05-22 DIAGNOSIS — J3489 Other specified disorders of nose and nasal sinuses: Secondary | ICD-10-CM | POA: Insufficient documentation

## 2012-05-22 DIAGNOSIS — R5381 Other malaise: Secondary | ICD-10-CM | POA: Insufficient documentation

## 2012-05-22 DIAGNOSIS — R5383 Other fatigue: Secondary | ICD-10-CM

## 2012-05-22 DIAGNOSIS — R059 Cough, unspecified: Secondary | ICD-10-CM | POA: Insufficient documentation

## 2012-05-22 DIAGNOSIS — Z87448 Personal history of other diseases of urinary system: Secondary | ICD-10-CM | POA: Insufficient documentation

## 2012-05-22 DIAGNOSIS — H9319 Tinnitus, unspecified ear: Secondary | ICD-10-CM | POA: Insufficient documentation

## 2012-05-22 DIAGNOSIS — H9209 Otalgia, unspecified ear: Secondary | ICD-10-CM

## 2012-05-22 DIAGNOSIS — Z79899 Other long term (current) drug therapy: Secondary | ICD-10-CM | POA: Insufficient documentation

## 2012-05-22 DIAGNOSIS — Z8711 Personal history of peptic ulcer disease: Secondary | ICD-10-CM | POA: Insufficient documentation

## 2012-05-22 DIAGNOSIS — H9190 Unspecified hearing loss, unspecified ear: Secondary | ICD-10-CM | POA: Insufficient documentation

## 2012-05-22 DIAGNOSIS — Z87891 Personal history of nicotine dependence: Secondary | ICD-10-CM | POA: Insufficient documentation

## 2012-05-22 DIAGNOSIS — Z8719 Personal history of other diseases of the digestive system: Secondary | ICD-10-CM | POA: Insufficient documentation

## 2012-05-22 DIAGNOSIS — R05 Cough: Secondary | ICD-10-CM | POA: Insufficient documentation

## 2012-05-22 DIAGNOSIS — E785 Hyperlipidemia, unspecified: Secondary | ICD-10-CM | POA: Insufficient documentation

## 2012-05-22 DIAGNOSIS — Z8739 Personal history of other diseases of the musculoskeletal system and connective tissue: Secondary | ICD-10-CM | POA: Insufficient documentation

## 2012-05-22 LAB — POCT I-STAT, CHEM 8
Calcium, Ion: 1.18 mmol/L (ref 1.13–1.30)
Chloride: 103 mEq/L (ref 96–112)
Glucose, Bld: 105 mg/dL — ABNORMAL HIGH (ref 70–99)
HCT: 43 % (ref 39.0–52.0)
Hemoglobin: 14.6 g/dL (ref 13.0–17.0)
TCO2: 31 mmol/L (ref 0–100)

## 2012-05-22 NOTE — ED Notes (Signed)
Per ems- pt c/o dizziness x3-5 min. Pt was sitting at time, c/o ringing in ears, dizziness, and "not feeling right." Pt family said pt was less responsive and pale during episode. Pt remembers entire episode. NAD noted. Afib on monitor, hx of same. HR-86 BP-126/80 R-20 O2-99% 18g IV lac

## 2012-05-22 NOTE — ED Notes (Signed)
Pt states he has had cold lately, states tonight he felt strange for 5 min or so, ringing in ears. Denies dizziness. Pt wife states pt was pale. NAD noted. Neuro intact. Pt states he feels fine presently. Denies any cp, sob, dizziness.

## 2012-05-22 NOTE — ED Provider Notes (Signed)
History     CSN: [id number]  Arrival date & time 05/22/12  1908   First MD Initiated Contact with Patient 05/22/12 1912      Chief Complaint  Patient presents with  . Near Syncope    (Consider location/radiation/quality/duration/timing/severity/associated sxs/prior treatment) HPI Comments: 79 y/o male p/w decreased hearing and mild tinnitus for 2-3 minutes. Patient has not experienced this previously. Was sitting down when symptoms came on while watching television. No headache, change in vision, SOB, fevers, vertigo, dizziness, light headed, or sensation of about to pass out (no near syncope). Congestion and rhinorrhea for past few months. Thought likely due to allergies. Minimal dry cough x2 days.  Patient is a 79 y.o. male presenting with general illness. The history is provided by the patient.  Illness  The current episode started today. The onset was sudden. Episode frequency: once. The problem has been resolved. The problem is moderate. Nothing relieves the symptoms. Nothing aggravates the symptoms. Associated symptoms include congestion, hearing loss, rhinorrhea and cough. Pertinent negatives include no orthopnea, no fever, no decreased vision, no double vision, no photophobia, no abdominal pain, no diarrhea, no nausea, no vomiting, no headaches, no sore throat, no stridor, no neck pain, no rash and no eye pain.    Past Medical History  Diagnosis Date  . History of alcohol abuse     quit drinking in the mid 80's  . BPH (benign prostatic hypertrophy)   . History of pancreatitis   . Peptic ulcer disease   . Cholecystitis   . Urine incontinence   . Hyperlipemia   . Osteoarthritis (arthritis due to wear and tear of joints)   . History of hemorrhoids     Past Surgical History  Procedure Date  . Cholecystectomy   . Tonsillectomy     Family History  Problem Relation Age of Onset  . Lymphoma    . Stroke      1st degree relative    History  Substance Use Topics  .  Smoking status: Former Smoker    Quit date: 03/20/2008  . Smokeless tobacco: Not on file  . Alcohol Use: No      Review of Systems  Constitutional: Negative for fever and chills.  HENT: Positive for hearing loss, congestion and rhinorrhea. Negative for sore throat and neck pain.   Eyes: Negative for double vision, photophobia, pain and visual disturbance.  Respiratory: Positive for cough. Negative for shortness of breath and stridor.   Cardiovascular: Negative for chest pain, orthopnea and leg swelling.  Gastrointestinal: Negative for nausea, vomiting, abdominal pain and diarrhea.  Genitourinary: Negative for flank pain and difficulty urinating.  Musculoskeletal: Negative for back pain.  Skin: Negative for color change and rash.  Neurological: Negative for dizziness, speech difficulty, weakness, light-headedness, numbness and headaches.  All other systems reviewed and are negative.    Allergies  Flexeril  Home Medications   Current Outpatient Rx  Name  Route  Sig  Dispense  Refill  . METOPROLOL TARTRATE 50 MG PO TABS   Oral   Take 50 mg by mouth daily.         [name] VITAMIN D (ERGOCALCIFEROL) 50000 UNITS PO CAPS   Oral   Take 50,000 Units by mouth every 7 (seven) days.         . ALLOPURINOL 100 MG PO TABS   Oral   Take 1 tablet (100 mg total) by mouth daily.   30 tablet   6   . COLCHICINE 0.6 MG PO  TABS   Oral   Take 1 tablet (0.6 mg total) by mouth 2 (two) times daily as needed.   60 tablet   1   . OMEGA-3 FATTY ACIDS 1000 MG PO CAPS   Oral   Take 1 g by mouth daily.         [name] ONE-DAILY MULTI VITAMINS PO TABS   Oral   Take 1 tablet by mouth daily.         . TRIAMTERENE-HCTZ 37.5-25 MG PO TABS   Oral   Take 0.5 each (0.5 tablets total) by mouth daily.   30 tablet   11     BP 145/91  Pulse 60  Resp 19  SpO2 98%  Physical Exam  Nursing note and vitals reviewed. Constitutional: He is oriented to person, place, and time. He appears  well-developed and well-nourished. No distress.  HENT:  Head: Normocephalic and atraumatic.  Right Ear: Tympanic membrane and external ear normal.  Left Ear: Tympanic membrane and external ear normal.  Mouth/Throat: Oropharynx is clear and moist.  Eyes: Conjunctivae normal are normal. Right eye exhibits no discharge. Left eye exhibits no discharge.  Neck: No tracheal deviation present.  Cardiovascular: Normal heart sounds and intact distal pulses.   Pulmonary/Chest: Effort normal and breath sounds normal. No stridor. No respiratory distress. He has no wheezes. He has no rales.  Abdominal: Soft. He exhibits no distension. There is no tenderness. There is no guarding.  Musculoskeletal: He exhibits no edema and no tenderness.  Neurological: He is alert and oriented to person, place, and time. He has normal strength. No cranial nerve deficit or sensory deficit. He displays a negative Romberg sign. Coordination and gait normal. GCS eye subscore is 4. GCS verbal subscore is 5. GCS motor subscore is 6.  Skin: Skin is warm and dry.  Psychiatric: He has a normal mood and affect. His behavior is normal.    ED Course  Procedures (including critical care time)  Labs Reviewed  POCT I-STAT, CHEM 8 - Abnormal; Notable for the following:    Creatinine, Ser 1.50 (*)     Glucose, Bld 105 (*)     All other components within normal limits   No results found.   1. Ear discomfort   2. Fatigue      Date: 05/22/2012  Rate: 77  Rhythm: atrial fibrillation  QRS Axis: indeterminate  Intervals: normal  ST/T Wave abnormalities: nonspecific ST/T changes  Conduction Disutrbances:right bundle branch block  Narrative Interpretation:   Old EKG Reviewed: patient with atrial fibrillation new from prior EKG.      MDM   79 y/o male p/w transient visual changes/loss with tinnitus. Resolved <3 minutes. Otherwise feeling well except for congestion/rhinorrhea past few months. Mild dry cough x2 days. Denies  any dizziness or "not feeling right" sensation. No mental status changes, no aphasia.  EKG with atrial fibrillation. Patient reports this as known to him. Not new. Not on coumadin. To f/u pcp.  Clinically orthostatic negative. Ambulatory without assistance.  F/u pcp for recheck BMP w/w elevated Cr. Discussed with patient.  Patient without s/s concerning for stroke.  Labs as above. TM wnl b/l Patient discharged home. Return precautions given. To follow up with pcp. patient in agreement with plan.  Labs and imaging reviewed by myself and considered in medical decision making if ordered. Imaging interpreted by radiology.   Discussed case with Dr. Fonnie Jarvis who is in agreement with assessment and plan.  Stevie Kern, MD 05/23/12 [phone]

## 2012-05-22 NOTE — ED Provider Notes (Signed)
I saw and evaluated the patient, reviewed the resident's note including ECG interpretation and I agree with the findings and plan.  Pt and family deny presyncope, headache, or focal neuro Sxs, Pt has total recall and family verifies the same.  Hurman Horn, MD 05/25/12 [phone]

## 2012-05-24 ENCOUNTER — Ambulatory Visit (INDEPENDENT_AMBULATORY_CARE_PROVIDER_SITE_OTHER): Payer: 59 | Admitting: Family

## 2012-05-24 ENCOUNTER — Encounter: Payer: Self-pay | Admitting: Family

## 2012-05-24 VITALS — BP 142/68 | HR 77 | Temp 97.4°F | Ht 70.0 in | Wt 160.0 lb

## 2012-05-24 DIAGNOSIS — Z85038 Personal history of other malignant neoplasm of large intestine: Secondary | ICD-10-CM

## 2012-05-24 DIAGNOSIS — I10 Essential (primary) hypertension: Secondary | ICD-10-CM

## 2012-05-24 DIAGNOSIS — I4891 Unspecified atrial fibrillation: Secondary | ICD-10-CM

## 2012-05-24 NOTE — Patient Instructions (Signed)
Atrial Fibrillation Your caregiver has diagnosed you with atrial fibrillation (AFib). The heart normally beats very regularly; AFib is a type of irregular heartbeat. The heart rate may be faster or slower than normal. This can prevent your heart from pumping as well as it should. AFib can be constant (chronic) or intermittent (paroxysmal). CAUSES  Atrial fibrillation may be caused by:  Heart disease, including heart attack, coronary artery disease, heart failure, diseases of the heart valves, and others.  Blood clot in the lungs (pulmonary embolism).  Pneumonia or other infections.  Chronic lung disease.  Thyroid disease.  Toxins. These include alcohol, some medications (such as decongestant medications or diet pills), and caffeine. In some people, no cause for AFib can be found. This is referred to as Lone Atrial Fibrillation. SYMPTOMS   Palpitations or a fluttering in your chest.  A vague sense of chest discomfort.  Shortness of breath.  Sudden onset of lightheadedness or weakness. Sometimes, the first sign of AFib can be a complication of the condition. This could be a stroke or heart failure. DIAGNOSIS  Your description of your condition may make your caregiver suspicious of atrial fibrillation. Your caregiver will examine your pulse to determine if fibrillation is present. An EKG (electrocardiogram) will confirm the diagnosis. Further testing may help determine what caused you to have atrial fibrillation. This may include chest x-ray, echocardiogram, blood tests, or CT scans. PREVENTION  If you have previously had atrial fibrillation, your caregiver may advise you to avoid substances known to cause the condition (such as stimulant medications, and possibly caffeine or alcohol). You may be advised to use medications to prevent recurrence. Proper treatment of any underlying condition is important to help prevent recurrence. PROGNOSIS  Atrial fibrillation does tend to become a  chronic condition over time. It can cause significant complications (see below). Atrial fibrillation is not usually immediately life-threatening, but it can shorten your life expectancy. This seems to be worse in women. If you have lone atrial fibrillation and are under 60 years old, the risk of complications is very low, and life expectancy is not shortened. RISKS AND COMPLICATIONS  Complications of atrial fibrillation can include stroke, chest pain, and heart failure. Your caregiver will recommend treatments for the atrial fibrillation, as well as for any underlying conditions, to help minimize risk of complications. TREATMENT  Treatment for AFib is divided into several categories:  Treatment of any underlying condition.  Converting you out of AFib into a regular (sinus) rhythm.  Controlling rapid heart rate.  Prevention of blood clots and stroke. Medications and procedures are available to convert your atrial fibrillation to sinus rhythm. However, recent studies have shown that this may not offer you any advantage, and cardiac experts are continuing research and debate on this topic. More important is controlling your rapid heartbeat. The rapid heartbeat causes more symptoms, and places strain on your heart. Your caregiver will advise you on the use of medications that can control your heart rate. Atrial fibrillation is a strong stroke risk. You can lessen this risk by taking blood thinning medications such as Coumadin (warfarin), or sometimes aspirin. These medications need close monitoring by your caregiver. Over-medication can cause bleeding. Too little medication may not protect against stroke. HOME CARE INSTRUCTIONS   If your caregiver prescribed medicine to make your heartbeat more normally, take as directed.  If blood thinners were prescribed by your caregiver, take EXACTLY as directed.  Perform blood tests EXACTLY as directed.  Quit smoking. Smoking increases your cardiac and   lung  (pulmonary) risks.  DO NOT drink alcohol.  DO NOT drink caffeinated drinks (e.g. coffee, soda, chocolate, and leaf teas). You may drink decaffeinated coffee, soda or tea.  If you are overweight, you should choose a reduced calorie diet to lose weight. Please see a registered dietitian if you need more information about healthy weight loss. DO NOT USE DIET PILLS as they may aggravate heart problems.  If you have other heart problems that are causing AFib, you may need to eat a low salt, fat, and cholesterol diet. Your caregiver will tell you if this is necessary.  Exercise every day to improve your physical fitness. Stay active unless advised otherwise.  If your caregiver has given you a follow-up appointment, it is very important to keep that appointment. Not keeping the appointment could result in heart failure or stroke. If there is any problem keeping the appointment, you must call back to this facility for assistance. SEEK MEDICAL CARE IF:  You notice a change in the rate, rhythm or strength of your heartbeat.  You develop an infection or any other change in your overall health status. SEEK IMMEDIATE MEDICAL CARE IF:   You develop chest pain, abdominal pain, sweating, weakness or feel sick to your stomach (nausea).  You develop shortness of breath.  You develop swollen feet and ankles.  You develop dizziness, numbness, or weakness of your face or limbs, or any change in vision or speech. MAKE SURE YOU:   Understand these instructions.  Will watch your condition.  Will get help right away if you are not doing well or get worse. Document Released: 04/06/2005 Document Revised: 06/29/2011 Document Reviewed: 11/09/2007 [facility] Patient Information 2013 [place], [place].   Warfarin Coagulopathy Warfarin (Coumadin) coagulopathy refers to bleeding that may occur as a complication of the medicine warfarin. Warfarin is an oral blood thinner (anticoagulant). Warfarin is used for  medical conditions where thinning of the blood is needed to prevent blood clots.  CAUSES Bleeding is the most common and most serious complication of warfarin. The amount of bleeding is related to the warfarin dose and length of treatment. In addition, bleeding complications can also occur due to:  Intentional or accidental warfarin overdose.  Underlying medical conditions.  Dietary changes.  Medicine, herbal, supplement, or alcohol interactions. SYMPTOMS Severe bleeding while on warfarin may occur from any tissue or organ. Symptoms of the blood being too thin may include:  Bleeding from the nose or gums.  Blood in bowel movements which may appear as bright red, dark, or black tarry stools.  Blood in the urine which may appear as pink, red, or brown urine.  Unusual bruising or bruising easily.  A cut that does not stop bleeding within 10 minutes.  Vomiting blood or continuous nausea for more than 1 day.  Coughing up blood.  Broken blood vessels in your eye (subconjunctival hemorrhage).  Abdominal or back pain with or without flank bruising.  Sudden, severe headache.  Sudden weakness or numbness of the face, arm, or leg, especially on one side of the body.  Sudden confusion.  Trouble speaking (aphasia) or understanding.  Sudden trouble seeing in one or both eyes.  Sudden trouble walking.  Dizziness.  Loss of balance or coordination.  Vaginal bleeding.  Swelling or pain at an injection site.  Superficial fat tissue death (necrosis) which may cause skin scarring. This is more common in women and may first present as pain in the waist, thighs, and buttocks.  Fever. HOME CARE INSTRUCTIONS  Always contact your caregiver of any concerns or signs of possible warfarin coagulopathy as soon as possible.  Take warfarin exactly as directed by your caregiver. It is recommended that you take your warfarin dose at the same time of the day. It is preferred that you take  warfarin in the late afternoon. If you have been told to stop taking warfarin, do not resume taking warfarin until directed to do so by your caregiver. Follow your caregiver's instructions if you accidentally take an extra dose or miss a dose of warfarin. It is very important to take warfarin as directed since bleeding or blood clots could result in chronic or permanent injury, pain, or disability.  Keep all follow-up appointments with your caregiver as directed. It is very important to keep your appointments. Not keeping appointments could result in a chronic or permanent injury, pain, or disability because warfarin is a medicine that requires close monitoring.  While taking warfarin, you will need to have regular blood tests to measure your blood clotting time. These blood tests usually include both the prothrombin time (PT) and International Normalized Ratio (INR) tests. The PT and INR results allow your caregiver to adjust your dose of warfarin. The dose can change for many reasons. It is critically important that you have your PT and INR levels drawn exactly as directed. PT and INR lab draws are usually done in the morning. Your warfarin dose may stay the same or change depending on what the PT and INR results are. Be sure to follow up with your caregiver regarding your PT and INR test results and what your warfarin dosage should be.  Many medicines can interfere with warfarin and affect the PT and INR results. You must tell your caregiver about any and all medicines you take, this includes all vitamins and supplements. Ask your caregiver before taking these. Prescription and over-the-counter medicine consistency is critical to warfarin management. It is important that potential interactions are checked before you start a new medicine. Be especially cautious with aspirin and anti-inflammatory medicines. Ask your caregiver before taking these. Medicines such as antibiotics and acid-reducing medicine can  interact with warfarin and can cause an increased warfarin effect. Warfarin can also interfere with the effectiveness of medicines you are taking. Do not take or discontinue any prescribed or over-the-counter medicine except on the advice of your caregiver or pharmacist.  Some vitamins, supplements, and herbal products interfere with the effectiveness of warfarin. Vitamin E may increase the anticoagulant effects of warfarin. Vitamin K may can cause warfarin to be less effective. Do not take or discontinue any vitamin, supplement, or herbal product except on the advice of your caregiver or pharmacist.  Some foods, especially foods high in vitamin K can interfere with the effectiveness of warfarin and affect the PT and INR results. A diet too high in vitamin K can cause warfarin to be less effective. A diet too low in foods containing vitamin K may lead to an excessive warfarin effect. Foods high in vitamin K include spinach, kale, broccoli, cabbage, collard and turnip greens, brussels sprouts, peas, cauliflower, seaweed, and parsley as well as beef and pork liver, green tea, and soybean oil. Eat what you normally eat and keep the vitamin K content of your diet consistent. Avoid major changes in your diet, or notify your caregiver before changing your diet. Arrange a visit with a dietitian to answer your questions.  If you have a loss of appetite or get the stomach flu (viral gastroenteritis), talk to  your caregiver as soon as possible. A decrease in your normal vitamin K intake can make you more sensitive to your usual dose of warfarin.  Some medical conditions may increase your risk for bleeding while you are taking warfarin. A fever, diarrhea lasting more than a day, worsening heart failure, or worsening liver function are some medical conditions that could affect warfarin. Contact your caregiver if you have any of these medical conditions.  Be careful not to cut yourself when using sharp objects or while  shaving.  Alcohol can change the body's ability to handle warfarin. It is best to avoid alcoholic drinks or consume only very small amounts while taking warfarin. Notify your caregiver if you change your alcohol intake. A sudden increase in alcohol use can increase your risk of bleeding. Chronic alcohol use can cause warfarin to be less effective.  Limit physical activities or sports that could result in a fall or cause injury.  Do not use warfarin if you are pregnant.  Inform all your caregivers and your dentist that you take warfarin.  Inform all caregivers if you are taking warfarin and aspirin or platelet inhibitor medicines such as clopidogrel, ticagrelor, or prasugrel. Use of these medicines in conjunction with warfarin can increase your risk of bleeding or death. Taking these medicines together should only be done under the direct care of your caregiver. SEEK IMMEDIATE MEDICAL CARE IF:  You cough up blood.  You have dark or black stools or there is bright red blood coming from your rectum.  You vomit blood or have nausea for more than 1 day.  You have blood in the urine or pink colored urine.  You have unusual bruising or have increased bruising.  You have bleeding from the nose or gums that does not stop quickly.  You have a cut that does not stop bleeding within a 2 3 minutes.  You have sudden weakness or numbness of the face, arm, or leg, especially on one side of the body.  You have sudden confusion.  You have trouble speaking (aphasia) or understanding.  You have sudden trouble seeing in one or both eyes.  You have sudden trouble walking.  You have dizziness.  You have a loss of balance or coordination.  You have a sudden, severe headache.  You have a serious fall or head injury, even if you are not bleeding.  You have swelling or pain at an injection site.  You have unexplained tenderness or pain in the abdomen, back, waist, thighs or buttocks.  You have  a fever. Any of these symptoms may represent a serious problem that is an emergency. Do not wait to see if the symptoms will go away. Get medical help right away. Call your local emergency services (911 in [place] Do not drive yourself to the hospital. Document Released: 03/15/2006 Document Revised: 10/06/2011 Document Reviewed: 09/15/2011 [facility] Patient Information 2013 [place], [place].

## 2012-05-24 NOTE — Progress Notes (Signed)
Subjective:    Patient ID: William Mahoney, male    DOB: 1925-09-14, 79 y.o.   MRN: [id number]  HPI 79 year old white male, nonsmoker, patient of Dr. Lovell Sheehan is in today as an emergency department followup. He was seen in the emergency department 2 days ago after a sudden feeling of his ears being clogged lasting 2-3 minutes until it went away. The ear congestion was worse when he was up walking around. Denies any upper respiratory symptoms like sneezing, coughing, nasal congestion. He had a full workup at the emergency department that included a relation of atrial fibrillation and now appears to be more chronic than intermittent. Is not currently on any therapy for management of atrial fibrillation. Therefore, the emergency department advised him to follow up with his PCP. No chest pain or palpitations.   As of note, patient has a history of colon cancer.   Review of Systems  Constitutional: Negative.   HENT: Negative.   Respiratory: Negative.   Cardiovascular: Negative.   Gastrointestinal: Negative.   Musculoskeletal: Negative.   Skin: Negative.   Neurological: Negative.   Hematological: Negative.   Psychiatric/Behavioral: Negative.    Past Medical History  Diagnosis Date  . History of alcohol abuse     quit drinking in the mid 80's  . BPH (benign prostatic hypertrophy)   . History of pancreatitis   . Peptic ulcer disease   . Cholecystitis   . Urine incontinence   . Hyperlipemia   . Osteoarthritis (arthritis due to wear and tear of joints)   . History of hemorrhoids     History   Social History  . Marital Status: Married    Spouse Name: N/A    Number of Children: N/A  . Years of Education: N/A   Occupational History  . Not on file.   Social History Main Topics  . Smoking status: Former Smoker    Quit date: 03/20/2008  . Smokeless tobacco: Not on file  . Alcohol Use: No  . Drug Use: No  . Sexually Active: Yes   Other Topics Concern  . Not on file   Social  History Narrative  . No narrative on file    Past Surgical History  Procedure Date  . Cholecystectomy   . Tonsillectomy     Family History  Problem Relation Age of Onset  . Lymphoma    . Stroke      1st degree relative    Allergies  Allergen Reactions  . Flexeril (Cyclobenzaprine Hcl)     The 10 mg dose   caused altered mental status    Current Outpatient Prescriptions on File Prior to Visit  Medication Sig Dispense Refill  . allopurinol (ZYLOPRIM) 100 MG tablet Take 1 tablet (100 mg total) by mouth daily.  30 tablet  6  . fish oil-omega-3 fatty acids 1000 MG capsule Take 1 g by mouth daily.      . metoprolol (LOPRESSOR) 50 MG tablet Take 50 mg by mouth daily.      . Multiple Vitamin (MULTIVITAMIN) tablet Take 1 tablet by mouth daily.      [name] triamterene-hydrochlorothiazide (MAXZIDE-25) 37.5-25 MG per tablet Take 0.5 each (0.5 tablets total) by mouth daily.  30 tablet  11  . Vitamin D, Ergocalciferol, (DRISDOL) 50000 UNITS CAPS Take 50,000 Units by mouth every 7 (seven) days.      . colchicine 0.6 MG tablet Take 1 tablet (0.6 mg total) by mouth 2 (two) times daily as needed.  60 tablet  1  BP 142/68  Pulse 77  Temp 97.4 F (36.3 C) (Oral)  Ht 5\' 10"  (1.778 m)  Wt 160 lb (72.576 kg)  BMI 22.96 kg/m2  SpO2 98%chart    Objective:   Physical Exam  Constitutional: He is oriented to person, place, and time. He appears well-developed and well-nourished.  HENT:  Right Ear: External ear normal.  Left Ear: External ear normal.  Nose: Nose normal.  Mouth/Throat: Oropharynx is clear and moist.  Neck: Normal range of motion. Neck supple.  Cardiovascular: Normal rate, regular rhythm and normal heart sounds.   Pulmonary/Chest: Effort normal and breath sounds normal.  Abdominal: Soft. Bowel sounds are normal.  Musculoskeletal: Normal range of motion.  Neurological: He is alert and oriented to person, place, and time.  Skin: Skin is warm and dry.  Psychiatric: He has a  normal mood and affect.          Assessment & Plan:  Assessment:  1. Atrial fibrillation-chronic 2. Hypertension 3. History of colon cancer  Plan: I spoke with patient at length about benefits versus risks of anticoagulation therapy with regards to atrial fibrillation. It also discussed the pathophysiology of atrial fibrillation the patient seems to understand. I have discussed Coumadin therapy versus newer agents (Xarelto or Eliquis). Patient has an appointment with Dr. Lovell Sheehan next week and would like to speak with him regarding a final decision. I have given him information on Coumadin Therapy and Xarelto. Since patient does have a history of colon cancer in the past, will have been effective than into the decision-making whether or not anticoagulation therapy is suitable. However, patient appears to be open to decreasing his risk of strokes, heart attacks, and DVTs.

## 2012-05-30 ENCOUNTER — Telehealth: Payer: Self-pay | Admitting: *Deleted

## 2012-05-30 NOTE — Telephone Encounter (Signed)
Patient called to ask if he should cancel his morning appointment with Dr. Truett Perna since he does not see Dr. Christella Hartigan till 3 pm that day? Made patient aware that his appointment with Lisa/Dr. Truett Perna is for 3/14 and not 2/14, so this will work out fine.

## 2012-06-02 ENCOUNTER — Encounter: Payer: Self-pay | Admitting: Gastroenterology

## 2012-06-03 ENCOUNTER — Ambulatory Visit: Payer: 59 | Admitting: Gastroenterology

## 2012-06-04 ENCOUNTER — Other Ambulatory Visit: Payer: Self-pay

## 2012-06-21 ENCOUNTER — Ambulatory Visit (INDEPENDENT_AMBULATORY_CARE_PROVIDER_SITE_OTHER): Payer: Medicare Other | Admitting: Gastroenterology

## 2012-06-21 ENCOUNTER — Encounter: Payer: Self-pay | Admitting: Gastroenterology

## 2012-06-21 VITALS — BP 124/70 | HR 80 | Ht 70.0 in | Wt 162.2 lb

## 2012-06-21 DIAGNOSIS — Z85038 Personal history of other malignant neoplasm of large intestine: Secondary | ICD-10-CM

## 2012-06-21 MED ORDER — PEG-KCL-NACL-NASULF-NA ASC-C 100 G PO SOLR: 1.0000 | Freq: Once | ORAL | Status: DC

## 2012-06-21 NOTE — Patient Instructions (Addendum)
You will be set up for a colonoscopy (LEC, moderate sedation) for colon cancer screening, surveillance.                                               We are excited to introduce MyChart, a new best-in-class service that provides you online access to important information in your electronic medical record. We want to make it easier for you to view your health information - all in one secure location - when and where you need it. We expect MyChart will enhance the quality of care and service we provide.  When you register for MyChart, you can:    View your test results.    Request appointments and receive appointment reminders via email.    Request medication renewals.    View your medical history, allergies, medications and immunizations.    Communicate with your physician's office through a password-protected site.    Conveniently print information such as your medication lists.  To find out if MyChart is right for you, please talk to a member of our clinical staff today. We will gladly answer your questions about this free health and wellness tool.  If you are age 77 or older and want a member of your family to have access to your record, you must provide written consent by completing a proxy form available at our office. Please speak to our clinical staff about guidelines regarding accounts for patients younger than age 64.  As you activate your MyChart account and need any technical assistance, please call the MyChart technical support line at [phone]) or email your question to [email or web address]. If you email your question(s), please include your name, a return phone number and the best time to reach you.  If you have non-urgent health-related questions, you can send a message to our office through MyChart at [email or web address]. If you have a medical emergency, call 911.  Thank you for using MyChart as your new health and wellness  resource!   MyChart licensed from [employer],  [id number]. Patents Pending.

## 2012-06-21 NOTE — Progress Notes (Signed)
Review of pertinent gastrointestinal problems: 1. Clinical stage II (T3 N0) rectal cancer diagnosed in September 2009 (jacobs colonoscopy), status post neoadjuvant radiation and Xeloda chemotherapy. Xeloda was discontinued 1 week prematurely due to cutaneous toxicity. Status post low anterior resection April 18, 2008 confirming a pathologic T3 N0 tumor with negative surgical margins. He completed 5 of 6 planned cycles of "adjuvant" Xeloda chemotherapy and then declined further chemotherapy.  Repeat colonoscopy 2010 with removal of single diminutive adenoma (jacobs)   HPI: This is a  very pleasant 79 year old man   I last saw him over 3 years ago at the time of a repeat colonoscopy in 2010.    Follows with Dr. Mancel Bale.   Has had some constipation but nothing worrisome to him. He sees no blood in his stool.  No serious heart or lung health concerns.   Review of systems: Pertinent positive and negative review of systems were noted in the above HPI section. Complete review of systems was performed and was otherwise normal.    Past Medical History  Diagnosis Date  . History of alcohol abuse     quit drinking in the mid 80's  . BPH (benign prostatic hypertrophy)   . History of pancreatitis   . Peptic ulcer disease   . Cholecystitis   . Urine incontinence   . Hyperlipemia   . Osteoarthritis (arthritis due to wear and tear of joints)   . History of hemorrhoids     Past Surgical History  Procedure Laterality Date  . Cholecystectomy    . Tonsillectomy      Current Outpatient Prescriptions  Medication Sig Dispense Refill  . allopurinol (ZYLOPRIM) 100 MG tablet Take 1 tablet (100 mg total) by mouth daily.  30 tablet  6  . colchicine 0.6 MG tablet Take 1 tablet (0.6 mg total) by mouth 2 (two) times daily as needed.  60 tablet  1  . fish oil-omega-3 fatty acids 1000 MG capsule Take 1 g by mouth daily.      . metoprolol (LOPRESSOR) 50 MG tablet Take 50 mg by mouth daily.       . Multiple Vitamin (MULTIVITAMIN) tablet Take 1 tablet by mouth daily.      [name] triamterene-hydrochlorothiazide (MAXZIDE-25) 37.5-25 MG per tablet Take 0.5 each (0.5 tablets total) by mouth daily.  30 tablet  11  . Vitamin D, Ergocalciferol, (DRISDOL) 50000 UNITS CAPS Take 50,000 Units by mouth every 7 (seven) days.       No current facility-administered medications for this visit.    Allergies as of 06/21/2012 - Review Complete 06/21/2012  Allergen Reaction Noted  . Flexeril (cyclobenzaprine hcl)  01/14/2011    Family History  Problem Relation Age of Onset  . Lymphoma    . Stroke      1st degree relative    History   Social History  . Marital Status: Married    Spouse Name: N/A    Number of Children: 6  . Years of Education: N/A   Occupational History  . Retired    Social History Main Topics  . Smoking status: Former Smoker    Quit date: 03/20/2008  . Smokeless tobacco: Never Used  . Alcohol Use: No  . Drug Use: No  . Sexually Active: Yes   Other Topics Concern  . Not on file   Social History Narrative  . No narrative on file       Physical Exam: BP 124/70  Pulse 80  Ht 5\' 10"  (1.778 m)  Wt 162 lb 3.2 oz (73.573 kg)  BMI 23.27 kg/m2 Constitutional: generally well-appearing Psychiatric: alert and oriented x3 Eyes: extraocular movements intact Mouth: oral pharynx moist, no lesions Neck: supple no lymphadenopathy Cardiovascular: heart regular rate and rhythm Lungs: clear to auscultation bilaterally Abdomen: soft, nontender, nondistended, no obvious ascites, no peritoneal signs, normal bowel sounds Extremities: no lower extremity edema bilaterally Skin: no lesions on visible extremities    Assessment and plan: 79 y.o. male with  personal history of colon cancer  He is due for colonoscopy around now, Mrs. 3 years since his last one which is routine followup recommendations following colon cancer diagnosis, treatment. He is 79 years old but is in very  good health. He could certainly tolerate colonoscopy examination. He will be following up with his oncologist the middle of this month and we will therefore get his repeat colonoscopy scheduled to be done before then.

## 2012-06-27 ENCOUNTER — Ambulatory Visit (AMBULATORY_SURGERY_CENTER): Payer: Medicare Other | Admitting: Gastroenterology

## 2012-06-27 ENCOUNTER — Encounter: Payer: Self-pay | Admitting: Gastroenterology

## 2012-06-27 VITALS — BP 103/67 | HR 56 | Temp 96.7°F | Resp 16 | Ht 70.0 in | Wt 162.0 lb

## 2012-06-27 DIAGNOSIS — K5730 Diverticulosis of large intestine without perforation or abscess without bleeding: Secondary | ICD-10-CM

## 2012-06-27 DIAGNOSIS — Z85038 Personal history of other malignant neoplasm of large intestine: Secondary | ICD-10-CM

## 2012-06-27 MED ORDER — SODIUM CHLORIDE 0.9 % IV SOLN: 500.0000 mL | INTRAVENOUS | Status: DC

## 2012-06-27 NOTE — Patient Instructions (Addendum)
Patient did not have preoperative order for IV antibiotic SSI prophylaxis. (G8918)YOU HAD AN ENDOSCOPIC PROCEDURE TODAY AT THE [facility]: Refer to the procedure report that was given to you for any specific questions about what was found during the examination.  If the procedure report does not answer your questions, please call your gastroenterologist to clarify.  If you requested that your care partner not be given the details of your procedure findings, then the procedure report has been included in a sealed envelope for you to review at your convenience later.  YOU SHOULD EXPECT: Some feelings of bloating in the abdomen. Passage of more gas than usual.  Walking can help get rid of the air that was put into your GI tract during the procedure and reduce the bloating. If you had a lower endoscopy (such as a colonoscopy or flexible sigmoidoscopy) you may notice spotting of blood in your stool or on the toilet paper. If you underwent a bowel prep for your procedure, then you may not have a normal bowel movement for a few days.  DIET: Your first meal following the procedure should be a light meal and then it is ok to progress to your normal diet.  A half-sandwich or bowl of soup is an example of a good first meal.  Heavy or fried foods are harder to digest and may make you feel nauseous or bloated.  Likewise meals heavy in dairy and vegetables can cause extra gas to form and this can also increase the bloating.  Drink plenty of fluids but you should avoid alcoholic beverages for 24 hours.  ACTIVITY: Your care partner should take you home directly after the procedure.  You should plan to take it easy, moving slowly for the rest of the day.  You can resume normal activity the day after the procedure however you should NOT DRIVE or use heavy machinery for 24 hours (because of the sedation medicines used during the test).    SYMPTOMS TO REPORT IMMEDIATELY: A gastroenterologist can be reached at any  hour.  During normal business hours, 8:30 AM to 5:00 PM Monday through Friday, call [phone].  After hours and on weekends, please call the GI answering service at [phone] who will take a message and have the physician on call contact you.   Following lower endoscopy (colonoscopy or flexible sigmoidoscopy):  Excessive amounts of blood in the stool  Significant tenderness or worsening of abdominal pains  Swelling of the abdomen that is new, acute  Fever of 100F or higher FOLLOW UP: If any biopsies were taken you will be contacted by phone or by letter within the next 1-3 weeks.  Call your gastroenterologist if you have not heard about the biopsies in 3 weeks.  Our staff will call the home number listed on your records the next business day following your procedure to check on you and address any questions or concerns that you may have at that time regarding the information given to you following your procedure. This is a courtesy call and so if there is no answer at the home number and we have not heard from you through the emergency physician on call, we will assume that you have returned to your regular daily activities without incident.  SIGNATURES/CONFIDENTIALITY: You and/or your care partner have signed paperwork which will be entered into your electronic medical record.  These signatures attest to the fact that that the information above on your After Visit Summary has been reviewed and  is understood.  Full responsibility of the confidentiality of this discharge information lies with you and/or your care-partner.

## 2012-06-27 NOTE — Op Note (Signed)
[facility] [address] [place], [address]   COLONOSCOPY PROCEDURE REPORT  PATIENT: William Mahoney, William Mahoney  MR#: [id number] BIRTHDATE: Jan 28, 1926 , 86  yrs. old GENDER: Male ENDOSCOPIST: Rachael Fee, MD PROCEDURE DATE:  06/27/2012 PROCEDURE:   Colonoscopy, surveillance ASA CLASS:   Class III INDICATIONS:Clinical stage II (T3 N0) rectal cancer diagnosed in September 2009 (jacobs colonoscopy), status post neoadjuvant radiation and Xeloda chemotherapy.  Xeloda was discontinued 1 week prematurely due to cutaneous toxicity.  Status post low anterior resection April 18, 2008 confirming a pathologic T3 N0 tumor with negative surgical margins.  He completed 5 of 6 planned cycles of "adjuvant" Xeloda chemotherapy and then declined further chemotherapy.  Repeat colonoscopy 2010 with removal of single diminutive adenoma (jacobs). MEDICATIONS: Fentanyl 25 mcg IV, Versed 4 mg IV, and These medications were titrated to patient response per physician's verbal order  DESCRIPTION OF PROCEDURE:   After the risks benefits and alternatives of the procedure were thoroughly explained, informed consent was obtained.  A digital rectal exam revealed no abnormalities of the rectum.   The LB [id number]  endoscope was introduced through the anus and advanced to the cecum, which was identified by both the appendix and ileocecal valve. No adverse events experienced.   The quality of the prep was good, using MoviPrep  The instrument was then slowly withdrawn as the colon was fully examined.  COLON FINDINGS: Anastomosis (rectosigmoid) was normal appearing.  No recurrent neoplastic lesions.  There were multiple diverticulum in left colon.  The examination was otherwise normal.  Retroflexed views revealed no abnormalities. The time to cecum=1 minutes 44 seconds.  Withdrawal time=6 minutes 39 seconds.  The scope was withdrawn and the procedure completed. COMPLICATIONS: There were no  complications.  ENDOSCOPIC IMPRESSION: Anastomosis (rectosigmoid) was normal appearing. No recurrent neoplastic lesions. There were multiple diverticulum in left colon. The examination was otherwise normal.  RECOMMENDATIONS: Given your age, you will not need another colonoscopy for colon cancer screening or polyp surveillance.  eSigned:  Rachael Fee, MD 06/27/2012 2:01 PM   cc: Darryll Capers, MD

## 2012-06-27 NOTE — Progress Notes (Addendum)
Patient did not have preoperative order for IV antibiotic SSI prophylaxis. (G8918)  Patient did not experience any of the following events: a burn prior to discharge; a fall within the facility; wrong site/side/patient/procedure/implant event; or a hospital transfer or hospital admission upon discharge from the facility. (G8907)  

## 2012-06-28 ENCOUNTER — Telehealth: Payer: Self-pay

## 2012-06-28 NOTE — Telephone Encounter (Signed)
  Follow up Call-  Call back number 06/27/2012  Post procedure Call Back phone  # [phone]  Permission to leave phone message Yes     Patient questions:  Do you have a fever, pain , or abdominal swelling? no Pain Score  0 *  Have you tolerated food without any problems? yes  Have you been able to return to your normal activities? yes  Do you have any questions about your discharge instructions: Diet   no Medications  no Follow up visit  no  Do you have questions or concerns about your Care? no  Actions: * If pain score is 4 or above: No action needed, pain <4.

## 2012-07-01 ENCOUNTER — Other Ambulatory Visit: Payer: Medicare Other | Admitting: Lab

## 2012-07-01 ENCOUNTER — Ambulatory Visit (HOSPITAL_BASED_OUTPATIENT_CLINIC_OR_DEPARTMENT_OTHER): Payer: Medicare Other | Admitting: Nurse Practitioner

## 2012-07-01 VITALS — BP 142/87 | HR 63 | Temp 96.9°F | Resp 20 | Ht 70.0 in | Wt 160.8 lb

## 2012-07-01 DIAGNOSIS — C19 Malignant neoplasm of rectosigmoid junction: Secondary | ICD-10-CM

## 2012-07-01 DIAGNOSIS — C20 Malignant neoplasm of rectum: Secondary | ICD-10-CM

## 2012-07-01 LAB — CEA: CEA: 1.1 ng/mL (ref 0.0–5.0)

## 2012-07-01 NOTE — Progress Notes (Signed)
OFFICE PROGRESS NOTE  Interval history:  William Mahoney returns as scheduled. He had a colonoscopy on 06/27/2012. The anastomosis was normal appearing. No recurrent neoplastic lesions. There were multiple diverticula in the left colon. The examination was otherwise normal. Given his age he will not need another colonoscopy for colon cancer screening or polyp surveillance.  He overall feels well. No change in bowel habits. No bloody or black stools. No nausea or vomiting. He denies abdominal pain. He has a good appetite. Weight is stable.   Objective: Blood pressure 142/87, pulse 63, temperature 96.9 F (36.1 C), temperature source Oral, resp. rate 20, height 5\' 10"  (1.778 m), weight 160 lb 12.8 oz (72.938 kg).  Oropharynx is without thrush or ulceration. No palpable cervical, supraclavicular, axillary or inguinal lymph nodes. Lungs are clear. Regular cardiac rhythm. Abdomen soft and nontender. No organomegaly. Extremities are without edema.  Lab Results: Lab Results  Component Value Date   WBC 5.2 11/30/2011   HGB 14.6 05/22/2012   HCT 43.0 05/22/2012   MCV 92.1 11/30/2011   PLT 134.0* 11/30/2011    Chemistry:    Chemistry      Component Value Date/Time   NA 141 05/22/2012 2049   K 4.2 05/22/2012 2049   CL 103 05/22/2012 2049   CO2 28 11/30/2011 0929   BUN 22 05/22/2012 2049   CREATININE 1.50* 05/22/2012 2049      Component Value Date/Time   CALCIUM 9.5 11/30/2011 0929   ALKPHOS 68 01/10/2011 2244   AST 21 01/10/2011 2244   ALT 15 01/10/2011 2244   BILITOT 1.1 01/10/2011 2244     CEA pending.  Studies/Results: No results found.  Medications: I have reviewed the patient's current medications.  Assessment/Plan:  1. Clinical stage II (T3 N0) rectal cancer diagnosed in September 2009, status post neoadjuvant radiation and Xeloda chemotherapy. Xeloda was discontinued 1 week prematurely due to cutaneous toxicity. 2. Status post low anterior resection April 18, 2008 confirming a pathologic T3  N0 tumor with negative surgical margins. He completed 5 of 6 planned cycles of "adjuvant" Xeloda chemotherapy and then declined further chemotherapy. 3. History of hand-foot syndrome secondary to Xeloda. 4. History of a skin rash secondary to Xeloda. 5. History of increased tearing and rhinorrhea secondary to Xeloda. 6. History of thrombocytopenia secondary to chemotherapy and radiation. 7. Indeterminate left renal mass, status post urology evaluation by Dr. Vonita Moss. 8. Chronic obstructive pulmonary disease. 9. Alcoholic cirrhosis.  10. History of hypertension 11. History of a cholecystectomy. 12. History of osteoarthritis. 13. Benign prostatic hypertrophy.  14. History of atrial fibrillation. 15. 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 16. Status post colonoscopy 01/23/2009. Rectal anastomosis at 10 cm from the anus, edematous mucosa without any suggestion of recurrent tumor; moderate diverticulosis, predominantly in the left colon; diminutive polyp in the proximal transverse colon which was removed; otherwise normal examination. Repeat colonoscopy recommended in 3 years.  17. Status post colonoscopy 06/27/2012. Rectosigmoid anastomosis was normal appearing. No recurrent neoplastic lesions. Multiple diverticula in the left colon. Examination otherwise normal. Given his age he was instructed that he will not need another colonoscopy for colon cancer screening or polyp surveillance.  Disposition-William Mahoney appears stable. He remains in clinical remission from rectal cancer. We will followup on the CEA from today. He will return for a followup visit and CEA in one year.  Plan reviewed with Dr. Truett Perna.  Lonna Cobb ANP/GNP-BC

## 2012-07-04 ENCOUNTER — Encounter: Payer: Self-pay | Admitting: Internal Medicine

## 2012-07-04 ENCOUNTER — Ambulatory Visit (INDEPENDENT_AMBULATORY_CARE_PROVIDER_SITE_OTHER): Payer: Medicare Other | Admitting: Internal Medicine

## 2012-07-04 ENCOUNTER — Telehealth: Payer: Self-pay | Admitting: *Deleted

## 2012-07-04 VITALS — BP 110/68 | HR 64 | Temp 97.5°F | Wt 162.0 lb

## 2012-07-04 DIAGNOSIS — G459 Transient cerebral ischemic attack, unspecified: Secondary | ICD-10-CM

## 2012-07-04 DIAGNOSIS — I4891 Unspecified atrial fibrillation: Secondary | ICD-10-CM

## 2012-07-04 LAB — CBC WITH DIFFERENTIAL/PLATELET
Basophils Absolute: 0 10*3/uL (ref 0.0–0.1)
Eosinophils Absolute: 0.1 10*3/uL (ref 0.0–0.7)
Hemoglobin: 14.6 g/dL (ref 13.0–17.0)
Lymphocytes Relative: 13.1 % (ref 12.0–46.0)
MCHC: 33.4 g/dL (ref 30.0–36.0)
Monocytes Relative: 10.7 % (ref 3.0–12.0)
Neutro Abs: 3.6 10*3/uL (ref 1.4–7.7)
Platelets: 130 10*3/uL — ABNORMAL LOW (ref 150.0–400.0)
RDW: 13.6 % (ref 11.5–14.6)

## 2012-07-04 LAB — PROTIME-INR: Prothrombin Time: 12.2 s (ref 10.2–12.4)

## 2012-07-04 LAB — APTT: aPTT: 29 s — ABNORMAL HIGH (ref 21.7–28.8)

## 2012-07-04 NOTE — Addendum Note (Signed)
Addended by: Bonnye Fava on: 07/04/2012 10:51 AM   Modules accepted: Orders

## 2012-07-04 NOTE — Telephone Encounter (Signed)
Called pt with CEA results. Normal, per Dr. Sherrill. He voiced understanding. 

## 2012-07-04 NOTE — Patient Instructions (Signed)
Read about the blood thinner

## 2012-07-04 NOTE — Progress Notes (Signed)
  Subjective:    Patient ID: William Mahoney, male    DOB: 1925-08-30, 79 y.o.   MRN: [id number]  HPI  A. Emergency room evaluation for possible TIA.  He has a history of atrial fibrillation and is not on anticoagulation because of a history of rectal carcinoma.  He had a colonoscopy which he was cleared in early March. He'll followup with oncology and oncology has released him for yearly followup at this point.  He is not on anticoagulation and has a controlled rate AF He is hesitant to try anticoagulation due to already noted "easy briusing"  BPH with leaking   Review of Systems  Constitutional: Negative for fever and fatigue.  HENT: Negative for hearing loss, congestion, neck pain and postnasal drip.   Eyes: Negative for discharge, redness and visual disturbance.  Respiratory: Negative for cough, shortness of breath and wheezing.   Cardiovascular: Negative for leg swelling.  Gastrointestinal: Negative for abdominal pain, constipation and abdominal distention.  Genitourinary: Negative for urgency and frequency.  Musculoskeletal: Negative for joint swelling and arthralgias.  Skin: Negative for color change and rash.  Neurological: Negative for weakness and light-headedness.  Hematological: Negative for adenopathy.  Psychiatric/Behavioral: Negative for behavioral problems.       Objective:   Physical Exam  Constitutional: He appears well-developed and well-nourished.  HENT:  Head: Normocephalic and atraumatic.  Eyes: Conjunctivae are normal. Pupils are equal, round, and reactive to light.  Neck: Normal range of motion. Neck supple.  Cardiovascular: Normal rate and regular rhythm.   Pulmonary/Chest: Effort normal and breath sounds normal.  Abdominal: Soft. Bowel sounds are normal.          Assessment & Plan:  CbC and PT/PTT to set base line Information about xaralto for stroke prevention  symptomatic enuresis/ leakage   Trial of  mybrig for overavtive bladders  with leakage

## 2012-07-04 NOTE — Telephone Encounter (Signed)
Message copied by Caleb Popp on Mon Jul 04, 2012  4:40 PM ------      Message from: Ladene Artist      Created: Sat Jul 02, 2012  1:59 PM       Please call patient, cea is normal ------

## 2012-07-05 ENCOUNTER — Telehealth: Payer: Self-pay | Admitting: Oncology

## 2012-08-03 ENCOUNTER — Other Ambulatory Visit: Payer: Self-pay | Admitting: Family

## 2012-09-05 ENCOUNTER — Other Ambulatory Visit: Payer: Self-pay | Admitting: Internal Medicine

## 2012-10-07 ENCOUNTER — Other Ambulatory Visit: Payer: Self-pay | Admitting: *Deleted

## 2012-10-07 MED ORDER — ALLOPURINOL 100 MG PO TABS: ORAL_TABLET | ORAL | Status: DC

## 2012-10-18 ENCOUNTER — Other Ambulatory Visit: Payer: Self-pay | Admitting: Internal Medicine

## 2012-11-09 ENCOUNTER — Ambulatory Visit: Payer: Medicare Other | Admitting: Internal Medicine

## 2012-11-10 ENCOUNTER — Encounter: Payer: Self-pay | Admitting: Internal Medicine

## 2012-11-10 ENCOUNTER — Ambulatory Visit (INDEPENDENT_AMBULATORY_CARE_PROVIDER_SITE_OTHER): Payer: Medicare Other | Admitting: Internal Medicine

## 2012-11-10 VITALS — BP 140/84 | HR 72 | Temp 98.2°F | Resp 16 | Ht 70.0 in | Wt 162.0 lb

## 2012-11-10 DIAGNOSIS — Z23 Encounter for immunization: Secondary | ICD-10-CM

## 2012-11-10 DIAGNOSIS — M1A00X1 Idiopathic chronic gout, unspecified site, with tophus (tophi): Secondary | ICD-10-CM

## 2012-11-10 DIAGNOSIS — J449 Chronic obstructive pulmonary disease, unspecified: Secondary | ICD-10-CM

## 2012-11-10 DIAGNOSIS — I10 Essential (primary) hypertension: Secondary | ICD-10-CM

## 2012-11-10 DIAGNOSIS — E785 Hyperlipidemia, unspecified: Secondary | ICD-10-CM

## 2012-11-10 DIAGNOSIS — L719 Rosacea, unspecified: Secondary | ICD-10-CM

## 2012-11-10 DIAGNOSIS — J4489 Other specified chronic obstructive pulmonary disease: Secondary | ICD-10-CM

## 2012-11-10 MED ORDER — DOXYCYCLINE HYCLATE 100 MG PO TABS: 100.0000 mg | ORAL_TABLET | Freq: Two times a day (BID) | ORAL | Status: DC

## 2012-11-10 MED ORDER — ALLOPURINOL 300 MG PO TABS: ORAL_TABLET | ORAL | Status: DC

## 2012-11-10 NOTE — Addendum Note (Signed)
Addended by: Alfred Levins D on: 11/10/2012 10:20 AM   Modules accepted: Orders

## 2012-11-10 NOTE — Patient Instructions (Signed)
Needs TD today

## 2012-11-10 NOTE — Progress Notes (Signed)
Subjective:    Patient ID: William Mahoney, male    DOB: 11/13/1925, 79 y.o.   MRN: [id number]  HPI COPD mild SOB No swelling in feet Has peripheral neuropathy that has improved with the B-12     Review of Systems  Constitutional: Negative for fever and fatigue.  HENT: Negative for hearing loss, congestion, neck pain and postnasal drip.   Eyes: Negative for discharge, redness and visual disturbance.  Respiratory: Negative for cough, shortness of breath and wheezing.   Cardiovascular: Negative for leg swelling.  Gastrointestinal: Negative for abdominal pain, constipation and abdominal distention.  Genitourinary: Negative for urgency and frequency.  Musculoskeletal: Negative for joint swelling and arthralgias.  Skin: Negative for color change and rash.  Neurological: Positive for numbness. Negative for weakness and light-headedness.       Numbness and neuropathy  Hematological: Negative for adenopathy.  Psychiatric/Behavioral: Negative for behavioral problems.   Past Medical History  Diagnosis Date  . History of alcohol abuse     quit drinking in the mid 80's  . BPH (benign prostatic hypertrophy)   . History of pancreatitis   . Peptic ulcer disease   . Cholecystitis   . Urine incontinence   . Hyperlipemia   . Osteoarthritis (arthritis due to wear and tear of joints)   . History of hemorrhoids     History   Social History  . Marital Status: Married    Spouse Name: N/A    Number of Children: 6  . Years of Education: N/A   Occupational History  . Retired    Social History Main Topics  . Smoking status: Former Smoker    Quit date: 03/20/2008  . Smokeless tobacco: Never Used  . Alcohol Use: No  . Drug Use: No  . Sexually Active: Yes   Other Topics Concern  . Not on file   Social History Narrative  . No narrative on file    Past Surgical History  Procedure Laterality Date  . Cholecystectomy    . Tonsillectomy      Family History  Problem Relation Age  of Onset  . Lymphoma    . Stroke      1st degree relative  . Colon cancer Neg Hx   . Esophageal cancer Neg Hx   . Rectal cancer Neg Hx   . Stomach cancer Neg Hx     Allergies  Allergen Reactions  . Flexeril (Cyclobenzaprine Hcl)     The 10 mg dose   caused altered mental status    Current Outpatient Prescriptions on File Prior to Visit  Medication Sig Dispense Refill  . allopurinol (ZYLOPRIM) 100 MG tablet TAKE ONE TABLET BY MOUTH ONCE DAILY  30 tablet  3  . COLCRYS 0.6 MG tablet TAKE ONE TABLET BY MOUTH TWICE DAILY AS NEEDED  60 tablet  0  . fish oil-omega-3 fatty acids 1000 MG capsule Take 1 g by mouth daily.      . metoprolol (LOPRESSOR) 50 MG tablet Take 50 mg by mouth daily.      . Multiple Vitamin (MULTIVITAMIN) tablet Take 1 tablet by mouth daily.      [name] triamterene-hydrochlorothiazide (MAXZIDE-25) 37.5-25 MG per tablet TAKE ONE-HALF TABLET BY MOUTH DAILY.  30 tablet  0  . Vitamin D, Ergocalciferol, (DRISDOL) 50000 UNITS CAPS Take 50,000 Units by mouth every 7 (seven) days.       No current facility-administered medications on file prior to visit.    BP 140/84  Pulse 72  Temp(Src) 98.2  F (36.8 C)  Resp 16  Ht 5\' 10"  (1.778 m)  Wt 162 lb (73.483 kg)  BMI 23.24 kg/m2       Objective:   Physical Exam  Nursing note and vitals reviewed. Constitutional: He appears well-developed and well-nourished.  HENT:  Head: Normocephalic and atraumatic.  Eyes: Conjunctivae are normal. Pupils are equal, round, and reactive to light.  Pulmonary/Chest: Effort normal and breath sounds normal.  Musculoskeletal: He exhibits edema and tenderness.  Neurological:  numbness  Skin: Rash noted. There is erythema.  rosasea          Assessment & Plan:  Significant rosacea. It is not respond to topicals Add doxycycline 100 daily Pressure stable Frequent exacerbations of gouty inflammation in the great toe bilaterally Increase the allopurinol

## 2012-11-25 ENCOUNTER — Encounter: Payer: Self-pay | Admitting: Internal Medicine

## 2012-12-08 ENCOUNTER — Other Ambulatory Visit: Payer: Self-pay | Admitting: *Deleted

## 2012-12-08 MED ORDER — TRIAMTERENE-HCTZ 37.5-25 MG PO TABS: ORAL_TABLET | ORAL | Status: DC

## 2013-01-05 ENCOUNTER — Other Ambulatory Visit: Payer: Self-pay | Admitting: Internal Medicine

## 2013-01-16 ENCOUNTER — Ambulatory Visit: Payer: Medicare Other | Admitting: Internal Medicine

## 2013-01-20 ENCOUNTER — Ambulatory Visit (INDEPENDENT_AMBULATORY_CARE_PROVIDER_SITE_OTHER): Payer: Medicare Other | Admitting: Internal Medicine

## 2013-01-20 ENCOUNTER — Encounter: Payer: Self-pay | Admitting: Internal Medicine

## 2013-01-20 VITALS — BP 110/70 | HR 72 | Temp 98.2°F | Resp 16 | Ht 70.0 in | Wt 162.0 lb

## 2013-01-20 DIAGNOSIS — M47817 Spondylosis without myelopathy or radiculopathy, lumbosacral region: Secondary | ICD-10-CM

## 2013-01-20 DIAGNOSIS — M47816 Spondylosis without myelopathy or radiculopathy, lumbar region: Secondary | ICD-10-CM | POA: Insufficient documentation

## 2013-01-20 DIAGNOSIS — I10 Essential (primary) hypertension: Secondary | ICD-10-CM

## 2013-01-20 DIAGNOSIS — Z23 Encounter for immunization: Secondary | ICD-10-CM

## 2013-01-20 MED ORDER — MELOXICAM 15 MG PO TABS: 15.0000 mg | ORAL_TABLET | Freq: Every day | ORAL | Status: DC

## 2013-01-20 NOTE — Progress Notes (Signed)
Subjective:    Patient ID: William Mahoney, male    DOB: 23-Jan-1926, 79 y.o.   MRN: [id number]  HPI The patient is followed for hypertension atrial fibrillation who presents today with a chief complaint of low back and neck pain sometimes it lateralizes and goes down one side.  He has a history of a sebaceous cyst on his chest and drained appears to have reoccured  His blood pressure stable his current medication He is on omega-3 for hyperlipidemia he takes colchicine for gout      Review of Systems  Constitutional: Negative for fever and fatigue.  HENT: Positive for neck stiffness. Negative for hearing loss, congestion, neck pain and postnasal drip.   Eyes: Negative for discharge, redness and visual disturbance.  Respiratory: Negative for cough, shortness of breath and wheezing.   Cardiovascular: Negative for leg swelling.  Gastrointestinal: Negative for abdominal pain, constipation and abdominal distention.  Genitourinary: Positive for frequency and enuresis. Negative for urgency.       Nocturia  Musculoskeletal: Positive for back pain, arthralgias and gait problem. Negative for joint swelling.  Skin: Negative for color change and rash.  Neurological: Negative for weakness and light-headedness.  Hematological: Negative for adenopathy.  Psychiatric/Behavioral: Negative for behavioral problems.   Past Medical History  Diagnosis Date  . History of alcohol abuse     quit drinking in the mid 80's  . BPH (benign prostatic hypertrophy)   . History of pancreatitis   . Peptic ulcer disease   . Cholecystitis   . Urine incontinence   . Hyperlipemia   . Osteoarthritis (arthritis due to wear and tear of joints)   . History of hemorrhoids     History   Social History  . Marital Status: Married    Spouse Name: N/A    Number of Children: 6  . Years of Education: N/A   Occupational History  . Retired    Social History Main Topics  . Smoking status: Former Smoker    Quit  date: 03/20/2008  . Smokeless tobacco: Never Used  . Alcohol Use: No  . Drug Use: No  . Sexual Activity: Yes   Other Topics Concern  . Not on file   Social History Narrative  . No narrative on file    Past Surgical History  Procedure Laterality Date  . Cholecystectomy    . Tonsillectomy      Family History  Problem Relation Age of Onset  . Lymphoma    . Stroke      1st degree relative  . Colon cancer Neg Hx   . Esophageal cancer Neg Hx   . Rectal cancer Neg Hx   . Stomach cancer Neg Hx     Allergies  Allergen Reactions  . Flexeril [Cyclobenzaprine Hcl]     The 10 mg dose   caused altered mental status    Current Outpatient Prescriptions on File Prior to Visit  Medication Sig Dispense Refill  . allopurinol (ZYLOPRIM) 300 MG tablet TAKE ONE TABLET BY MOUTH ONCE DAILY  30 tablet  11  . COLCRYS 0.6 MG tablet TAKE ONE TABLET BY MOUTH TWICE DAILY AS NEEDED  60 tablet  0  . fish oil-omega-3 fatty acids 1000 MG capsule Take 1 g by mouth daily.      . metoprolol (LOPRESSOR) 50 MG tablet Take 50 mg by mouth daily.      . Multiple Vitamin (MULTIVITAMIN) tablet Take 1 tablet by mouth daily.      [name] triamterene-hydrochlorothiazide (MAXZIDE-25)  37.5-25 MG per tablet TAKE ONE-HALF TABLET BY MOUTH DAILY.  30 tablet  3  . Vitamin D, Ergocalciferol, (DRISDOL) 50000 UNITS CAPS Take 50,000 Units by mouth every 7 (seven) days.       No current facility-administered medications on file prior to visit.    BP 110/70  Pulse 72  Temp(Src) 98.2 F (36.8 C)  Resp 16  Ht 5\' 10"  (1.778 m)  Wt 162 lb (73.483 kg)  BMI 23.24 kg/m2        Objective:   Physical Exam  Constitutional: He appears well-developed and well-nourished.  HENT:  Head: Normocephalic and atraumatic.  Eyes: Conjunctivae are normal. Pupils are equal, round, and reactive to light.  Neck: Normal range of motion. Neck supple.  Cardiovascular: Normal rate and regular rhythm.   Pulmonary/Chest: Effort normal and  breath sounds normal.  Abdominal: Soft. Bowel sounds are normal.          Assessment & Plan:  Low back pain will give him a low-dose nonsteroidal prostatitis of the back I would recommend Mobic 15 mg 1 by mouth daily  Drain by local incision cyst on chest

## 2013-01-20 NOTE — Patient Instructions (Signed)
The patient is instructed to continue all medications as prescribed. Schedule followup with check out clerk upon leaving the clinic  

## 2013-02-14 ENCOUNTER — Other Ambulatory Visit: Payer: Self-pay | Admitting: Internal Medicine

## 2013-06-19 ENCOUNTER — Telehealth: Payer: Self-pay | Admitting: Oncology

## 2013-06-19 NOTE — Telephone Encounter (Signed)
Talked to pt gave him a new appt for April , r/s from March due to MD's PAL

## 2013-06-30 ENCOUNTER — Ambulatory Visit: Payer: Medicare Other | Admitting: Oncology

## 2013-06-30 ENCOUNTER — Other Ambulatory Visit: Payer: Medicare Other

## 2013-08-09 ENCOUNTER — Other Ambulatory Visit: Payer: Self-pay | Admitting: *Deleted

## 2013-08-09 DIAGNOSIS — C19 Malignant neoplasm of rectosigmoid junction: Secondary | ICD-10-CM

## 2013-08-09 DIAGNOSIS — C44711 Basal cell carcinoma of skin of unspecified lower limb, including hip: Secondary | ICD-10-CM

## 2013-08-10 ENCOUNTER — Other Ambulatory Visit (HOSPITAL_BASED_OUTPATIENT_CLINIC_OR_DEPARTMENT_OTHER): Payer: Medicare Other

## 2013-08-10 ENCOUNTER — Ambulatory Visit (HOSPITAL_BASED_OUTPATIENT_CLINIC_OR_DEPARTMENT_OTHER): Payer: Medicare Other | Admitting: Oncology

## 2013-08-10 ENCOUNTER — Telehealth: Payer: Self-pay | Admitting: Oncology

## 2013-08-10 ENCOUNTER — Ambulatory Visit (HOSPITAL_BASED_OUTPATIENT_CLINIC_OR_DEPARTMENT_OTHER): Payer: Medicare Other

## 2013-08-10 VITALS — BP 155/97 | HR 53 | Temp 97.3°F | Resp 18 | Ht 70.0 in | Wt 159.9 lb

## 2013-08-10 DIAGNOSIS — C19 Malignant neoplasm of rectosigmoid junction: Secondary | ICD-10-CM

## 2013-08-10 DIAGNOSIS — Z85048 Personal history of other malignant neoplasm of rectum, rectosigmoid junction, and anus: Secondary | ICD-10-CM

## 2013-08-10 DIAGNOSIS — C44711 Basal cell carcinoma of skin of unspecified lower limb, including hip: Secondary | ICD-10-CM

## 2013-08-10 LAB — COMPREHENSIVE METABOLIC PANEL (CC13)
ALT: 17 U/L (ref 0–55)
AST: 32 U/L (ref 5–34)
Albumin: 4 g/dL (ref 3.5–5.0)
Alkaline Phosphatase: 73 U/L (ref 40–150)
Anion Gap: 10 mEq/L (ref 3–11)
BILIRUBIN TOTAL: 0.71 mg/dL (ref 0.20–1.20)
BUN: 16.6 mg/dL (ref 7.0–26.0)
CO2: 30 mEq/L — ABNORMAL HIGH (ref 22–29)
Calcium: 10.1 mg/dL (ref 8.4–10.4)
Chloride: 104 mEq/L (ref 98–109)
Creatinine: 1.2 mg/dL (ref 0.7–1.3)
Glucose: 82 mg/dl (ref 70–140)
Potassium: 4.1 mEq/L (ref 3.5–5.1)
Sodium: 143 mEq/L (ref 136–145)
Total Protein: 7.2 g/dL (ref 6.4–8.3)

## 2013-08-10 LAB — CBC WITH DIFFERENTIAL/PLATELET
BASO%: 1 % (ref 0.0–2.0)
Basophils Absolute: 0.1 10*3/uL (ref 0.0–0.1)
EOS%: 2.8 % (ref 0.0–7.0)
Eosinophils Absolute: 0.2 10*3/uL (ref 0.0–0.5)
HEMATOCRIT: 45.6 % (ref 38.4–49.9)
HGB: 14.9 g/dL (ref 13.0–17.1)
LYMPH%: 20.6 % (ref 14.0–49.0)
MCH: 30.3 pg (ref 27.2–33.4)
MCHC: 32.7 g/dL (ref 32.0–36.0)
MCV: 92.9 fL (ref 79.3–98.0)
MONO#: 0.8 10*3/uL (ref 0.1–0.9)
MONO%: 15.2 % — AB (ref 0.0–14.0)
NEUT#: 3.4 10*3/uL (ref 1.5–6.5)
NEUT%: 60.4 % (ref 39.0–75.0)
PLATELETS: 133 10*3/uL — AB (ref 140–400)
RBC: 4.9 10*6/uL (ref 4.20–5.82)
RDW: 14.6 % (ref 11.0–14.6)
WBC: 5.6 10*3/uL (ref 4.0–10.3)
lymph#: 1.1 10*3/uL (ref 0.9–3.3)

## 2013-08-10 NOTE — Telephone Encounter (Signed)
sent pt to labs today

## 2013-08-10 NOTE — Progress Notes (Signed)
  William Mahoney OFFICE PROGRESS NOTE   Diagnosis: Rectal cancer  INTERVAL HISTORY:   William Mahoney returns as scheduled. He feels well. Good appetite. He has intermittent urinary incontinence.  Objective:  Vital signs in last 24 hours:  Blood pressure 155/97, pulse 53, temperature 97.3 F (36.3 C), temperature source Oral, resp. rate 18, height 5\' 10"  (1.778 m), weight 159 lb 14.4 oz (72.53 kg), SpO2 90.00%.    HEENT: Neck without mass Lymphatics: No cervical, supraclavicular, axillary, or inguinal nodes Resp: Lungs clear bilaterally Cardio: Regular rate and rhythm GI: No hepatosplenomegaly Vascular: No leg edema   Lab Results:  Lab Results  Component Value Date   WBC 5.6 08/10/2013   HGB 14.9 08/10/2013   HCT 45.6 08/10/2013   MCV 92.9 08/10/2013   PLT 133* 08/10/2013   NEUTROABS 3.4 08/10/2013    Lab Results  Component Value Date   CEA 1.1 07/01/2012    Imaging:  No results found.  Medications: I have reviewed the patient's current medications.  Assessment/Plan: 1. Clinical stage II (T3 N0) rectal cancer diagnosed in September 2009, status post neoadjuvant radiation and Xeloda chemotherapy. Xeloda was discontinued 1 week prematurely due to cutaneous toxicity. 2. Status post low anterior resection April 18, 2008 confirming a pathologic T3 N0 tumor with negative surgical margins. He completed 5 of 6 planned cycles of "adjuvant" Xeloda chemotherapy and then declined further chemotherapy. 3. History of hand-foot syndrome secondary to Xeloda. 4. History of a skin rash secondary to Xeloda. 5. History of increased tearing and rhinorrhea secondary to Xeloda. 6. History of thrombocytopenia secondary to chemotherapy and radiation, mild persistent thrombocytopenia 7. Indeterminate left renal mass, status post urology evaluation by Dr. Terance Hart. 8. Chronic obstructive pulmonary disease. 9. Alcoholic cirrhosis.  10. History of hypertension 11. History of a  cholecystectomy. 12. History of osteoarthritis. 13. Benign prostatic hypertrophy.  14. History of atrial fibrillation. 15. 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 16. Status post colonoscopy 01/23/2009. Rectal anastomosis at 10 cm from the anus, edematous mucosa without any suggestion of recurrent tumor; moderate diverticulosis, predominantly in the left colon; diminutive polyp in the proximal transverse colon which was removed; otherwise normal examination. Repeat colonoscopy recommended in 3 years.  17. Status post colonoscopy 06/27/2012. Rectosigmoid anastomosis was normal appearing. No recurrent neoplastic lesions. Multiple diverticula in the left colon. Examination otherwise normal. Given his age he was instructed that he will not need another colonoscopy for colon cancer screening or polyp surveillance.  Disposition:  William Mahoney remains in clinical remission from rectal cancer. We will followup on the CEA from today. He was discharged from the oncology clinic today. We will see him in the future as needed.  Ladell Pier, MD  08/10/2013  3:10 PM

## 2013-08-11 ENCOUNTER — Telehealth: Payer: Self-pay | Admitting: *Deleted

## 2013-08-11 LAB — CEA: CEA: 1.5 ng/mL (ref 0.0–5.0)

## 2013-08-11 NOTE — Telephone Encounter (Signed)
Message copied by Brien Few on Fri Aug 11, 2013 11:08 AM ------      Message from: William Mahoney      Created: Fri Aug 11, 2013  7:19 AM       Please call patient, cea is normal ------

## 2013-08-11 NOTE — Telephone Encounter (Signed)
Called pt with lab results. CEA normal, per Dr. Benay Spice. Pt voiced understanding.

## 2013-08-15 ENCOUNTER — Telehealth: Payer: Self-pay | Admitting: Internal Medicine

## 2013-08-15 ENCOUNTER — Encounter: Payer: Self-pay | Admitting: Internal Medicine

## 2013-08-15 ENCOUNTER — Ambulatory Visit (INDEPENDENT_AMBULATORY_CARE_PROVIDER_SITE_OTHER): Payer: Medicare Other | Admitting: Internal Medicine

## 2013-08-15 VITALS — BP 170/100 | HR 61 | Temp 97.6°F | Wt 159.0 lb

## 2013-08-15 DIAGNOSIS — R6889 Other general symptoms and signs: Secondary | ICD-10-CM | POA: Insufficient documentation

## 2013-08-15 DIAGNOSIS — I10 Essential (primary) hypertension: Secondary | ICD-10-CM

## 2013-08-15 DIAGNOSIS — J309 Allergic rhinitis, unspecified: Secondary | ICD-10-CM | POA: Insufficient documentation

## 2013-08-15 DIAGNOSIS — R69 Illness, unspecified: Secondary | ICD-10-CM

## 2013-08-15 MED ORDER — TRIAMTERENE-HCTZ 37.5-25 MG PO TABS: ORAL_TABLET | ORAL | Status: DC

## 2013-08-15 NOTE — Progress Notes (Signed)
Pre visit review using our clinic review tool, if applicable. No additional management support is needed unless otherwise documented below in the visit note.   Chief Complaint  Patient presents with  . Hypertension    Started at noon today.  . Dizziness    HPI: Patient comes in today for SDA for  new problem evaluation. Here with wife and daughter  pcp na ;  About 11 am to go out shopping and didn't feel that well and sat and took it easy and then took BP readings and was noted to be high 170/110 for him . Uncomfortable feeling and bp was still up and not coming down . Is on dyazide and lopressor.  Once a day some times ireg at times.  Took at 10 am today may have missed a few doses last week Allergy today   eyes watery rhinitis  Allegra  180 For a week or so  b6  Tylenol. Pm but no new meds  recently saw dr Benay Spice fu colon cancer  Fu is on a prn basis  ROS: See pertinent positives and negatives per HPI.no cp sob syncope numbness weakness vision change  Appetite change rash falling ha  No lightheadedness or vertigo   Past Medical History  Diagnosis Date  . History of alcohol abuse     quit drinking in the mid 80's  . BPH (benign prostatic hypertrophy)   . History of pancreatitis   . Peptic ulcer disease   . Cholecystitis   . Urine incontinence   . Hyperlipemia   . Osteoarthritis (arthritis due to wear and tear of joints)   . History of hemorrhoids     Family History  Problem Relation Age of Onset  . Lymphoma    . Stroke      1st degree relative  . Colon cancer Neg Hx   . Esophageal cancer Neg Hx   . Rectal cancer Neg Hx   . Stomach cancer Neg Hx     History   Social History  . Marital Status: Married    Spouse Name: N/A    Number of Children: 6  . Years of Education: N/A   Occupational History  . Retired    Social History Main Topics  . Smoking status: Former Smoker    Quit date: 03/20/2008  . Smokeless tobacco: Never Used  . Alcohol Use: No  . Drug  Use: No  . Sexual Activity: Yes   Other Topics Concern  . Not on file   Social History Narrative  . No narrative on file    Outpatient Encounter Prescriptions as of 08/15/2013  Medication Sig  . allopurinol (ZYLOPRIM) 300 MG tablet Take 150 mg by mouth daily.  . fish oil-omega-3 fatty acids 1000 MG capsule Take 1 g by mouth daily.  . metoprolol (LOPRESSOR) 50 MG tablet Take 0.5 tablets (25 mg total) by mouth 2 (two) times daily.  . Multiple Vitamin (MULTIVITAMIN) tablet Take 1 tablet by mouth daily.  [name] triamterene-hydrochlorothiazide (MAXZIDE-25) 37.5-25 MG per tablet TAKE ONE-HALF TABLET BY MOUTH DAILY.can increase to 1 per day  . [DISCONTINUED] metoprolol (LOPRESSOR) 50 MG tablet Take 50 mg by mouth daily.  . [DISCONTINUED] triamterene-hydrochlorothiazide (MAXZIDE-25) 37.5-25 MG per tablet TAKE ONE-HALF TABLET BY MOUTH DAILY.  [name] COLCRYS 0.6 MG tablet TAKE ONE TABLET BY MOUTH TWICE DAILY AS NEEDED  . [DISCONTINUED] meloxicam (MOBIC) 15 MG tablet Take 1 tablet (15 mg total) by mouth daily.    EXAM:  BP 170/100  Pulse  61  Temp(Src) 97.6 F (36.4 C) (Oral)  Wt 159 lb (72.122 kg)  SpO2 97% BP Readings from Last 3 Encounters:  08/15/13 170/100  08/10/13 155/97  01/20/13 110/70    Body mass index is 22.81 kg/(m^2). 170/84  right  162/82  Pulse  64 reg apical  Some pulsus paraadozus no jvd or rep distress  GENERAL: vitals reviewed and listed above, alert, oriented, appears well hydrated and in no acute distress HEENT: atraumatic, conjunctiva  clear, no obvious abnormalities on inspection of external nose and ears  Some waxx in eac tm grey OP : no lesion edema or exudate tongue midline  NECK: no obvious masses on inspection palpation  LUNGS: clear to auscultation bilaterally, no wheezes, rales or rhonchi,  Abdomen:  Sof,t normal bowel sounds without hepatosplenomegaly, no guarding rebound or masses  CV: HRRR, no clubbing cyanosis or  peripheral edema nl cap refill  MS: moves all  extremities without noticeable focal  abnormality PSYCH: pleasant and cooperative,  Neuro grossly non focal  Nl attention cognition and speech. Gait balance nl.  Labs done last week stable cmpand cbcdiff  ASSESSMENT AND PLAN:  Discussed the following assessment and plan:  Unspecified essential hypertension - up today ? cause ? missed dose yesterday ? other  non focal exam close observation split lopr to bid vs ER offered if not controlled inc diur to [facility]  Allergic rhinitis - taking allegra   Feeling poorly - vague sx  non focal exam wife thinks stress he says not that . no focal exam   disc dosing of metoprolol adherence then  Inc diuretic to one a day consider adding med or inc dose of metoprolol if pulse allows vs adding new medicatoin -Patient advised to return or notify health care team  if symptoms worsen ,persist or new concerns arise.  Patient Instructions  Take lopressor 25 mg twice a day .  And if not coming down then increase the diuretic to whole pill once a day . Monitor bp  Readings    return office visit in 3 weeks   Contact us if rapid heart rate and  Or low  Rate 40 or lower .   Standley Brooking. Panosh M.D.   Patient comes in today for SDA for  new problem evaluation. Total visit 58mins > 50% spent counseling and coordinating care

## 2013-08-15 NOTE — Patient Instructions (Signed)
Take lopressor 25 mg twice a day .  And if not coming down then increase the diuretic to whole pill once a day . Monitor bp  Readings    return office visit in 3 weeks   Contact us if rapid heart rate and  Or low  Rate 40 or lower .

## 2013-08-15 NOTE — Telephone Encounter (Signed)
Patient Information:  Caller Name: Joycelyn Schmid  Phone: [phone]  Patient: William Mahoney, William Mahoney  Gender: Male  DOB: 08/15/1925  Age: 80 Years  PCP: Benay Pillow (Adults only)  Office Follow Up:  Does the office need to follow up with this patient?: No  Instructions For The Office: N/A  RN Note:  Wife calling regarding spouse/Bakari c/o weakness, dizziness and elevated in blood pressure.  Admits to decrease fluid intake.  Adult daughter is with parents now and will drive to office.  Symptoms  Reason For Call & Symptoms: c/o elvated b/p 171/105, dizziness  Reviewed Health History In EMR: Yes  Reviewed Medications In EMR: Yes  Reviewed Allergies In EMR: Yes  Reviewed Surgeries / Procedures: Yes  Date of Onset of Symptoms: 09/10/2013  Guideline(s) Used:  Dizziness  Disposition Per Guideline:   See Today in Office  Reason For Disposition Reached:   Patient wants to be seen  Advice Given:  N/A  Patient Will Follow Care Advice:  YES  Appointment Scheduled:  08/15/2013 15:45:33 Appointment Scheduled Provider:  Shanon Ace (Family Practice)

## 2013-08-15 NOTE — Telephone Encounter (Signed)
Noted  

## 2013-08-15 NOTE — Telephone Encounter (Signed)
Pt needs a ROV in 2-3 weeks with PCP.  PT would like for Dr. Arnoldo Morale to recommend a new PCP for him.  Please advise.

## 2013-08-16 NOTE — Telephone Encounter (Signed)
Set him up with mat for 2-3 weeks follow up and then we can set him up with Dr hunter

## 2013-08-17 NOTE — Telephone Encounter (Signed)
LMOM for pt regarding the below reply from Dr. Arnoldo Morale.

## 2013-09-04 ENCOUNTER — Other Ambulatory Visit: Payer: Self-pay | Admitting: Internal Medicine

## 2013-10-30 ENCOUNTER — Other Ambulatory Visit: Payer: Self-pay | Admitting: Internal Medicine

## 2013-11-13 ENCOUNTER — Other Ambulatory Visit: Payer: Self-pay | Admitting: Internal Medicine

## 2013-11-28 ENCOUNTER — Encounter: Payer: Self-pay | Admitting: Gastroenterology

## 2014-01-03 ENCOUNTER — Telehealth: Payer: Self-pay | Admitting: Internal Medicine

## 2014-01-03 MED ORDER — TRIAMTERENE-HCTZ 37.5-25 MG PO TABS: ORAL_TABLET | ORAL | Status: DC

## 2014-01-03 NOTE — Telephone Encounter (Signed)
30 day supply sent in to [facility], but pt will need to establish with a new physician

## 2014-01-03 NOTE — Telephone Encounter (Signed)
[facility] [place], [place] - [address] Is requesting re-fill on triamterene-hydrochlorothiazide (MAXZIDE-25) 37.5-25 MG per tablet

## 2014-01-03 NOTE — Telephone Encounter (Signed)
Pt is scheduled with Dr. Yong Channel

## 2014-01-09 ENCOUNTER — Ambulatory Visit (INDEPENDENT_AMBULATORY_CARE_PROVIDER_SITE_OTHER): Payer: Medicare Other | Admitting: Family Medicine

## 2014-01-09 ENCOUNTER — Encounter: Payer: Self-pay | Admitting: Family Medicine

## 2014-01-09 VITALS — BP 140/78 | HR 60 | Temp 97.3°F | Wt 160.0 lb

## 2014-01-09 DIAGNOSIS — I10 Essential (primary) hypertension: Secondary | ICD-10-CM

## 2014-01-09 DIAGNOSIS — L570 Actinic keratosis: Secondary | ICD-10-CM

## 2014-01-09 DIAGNOSIS — Z23 Encounter for immunization: Secondary | ICD-10-CM

## 2014-01-09 DIAGNOSIS — M109 Gout, unspecified: Secondary | ICD-10-CM

## 2014-01-09 DIAGNOSIS — I482 Chronic atrial fibrillation, unspecified: Secondary | ICD-10-CM

## 2014-01-09 DIAGNOSIS — I4891 Unspecified atrial fibrillation: Secondary | ICD-10-CM

## 2014-01-09 MED ORDER — TRIAMTERENE-HCTZ 37.5-25 MG PO TABS: ORAL_TABLET | ORAL | Status: DC

## 2014-01-09 MED ORDER — METOPROLOL TARTRATE 50 MG PO TABS: 25.0000 mg | ORAL_TABLET | Freq: Two times a day (BID) | ORAL | Status: DC

## 2014-01-09 NOTE — Assessment & Plan Note (Signed)
Goal SBP <150/90. Continue triamterine-hctz and metoprolol.

## 2014-01-09 NOTE — Assessment & Plan Note (Signed)
In a fib today. Chads2 score of 2-start ASA to reduce stroke risk. Discussed coumadin/xarelto but patient opts not for these agents. If he were to have an event incl. TIA or CVA. he agrees to start one of these.

## 2014-01-09 NOTE — Patient Instructions (Addendum)
Start Aspirin 81mg  for stroke prevention give your atrial fibrillation. We discussed using warfarin but decided that benefits/risks you would prefer just aspirin for now.   Refilled blood pressure medicine. Blood pressure looked fine today.   We froze off an actinic keratosis on your right cheek. Areas may blister up, ok to use vaseline and a bandaid or antibiotic cream.

## 2014-01-09 NOTE — Progress Notes (Signed)
William Reddish, MD Phone: [phone]  Subjective:  Patient presents today to establish care with me as their new primary care provider. Patient was formerly a patient of Dr. Arnoldo Morale. Chief complaint-noted.   Atrial Fibrillation-stable Rate controlled on metoprolol. No anticoagulation. Possibly because history thrombocytopenia at baseline. ROS- no palpitations, chest pain, or shortness of breath.   Hypertension-stable  BP Readings from Last 3 Encounters:  01/09/14 140/78  08/15/13 170/100  08/10/13 155/97  Home BP monitoring-no Compliant with medications-yes without side effects ROS-Denies any CP, HA, SOB, blurry vision, LE edema  Gout-stable Doing well on allopurinol. Sparing use of colcrys ROS-no hot swollen joints   The following were reviewed and entered/updated in epic: Past Medical History  Diagnosis Date  . History of alcohol abuse     quit drinking in the mid 80's  . BPH (benign prostatic hypertrophy)   . History of pancreatitis   . Peptic ulcer disease   . Cholecystitis   . Urine incontinence   . Hyperlipemia   . Osteoarthritis (arthritis due to wear and tear of joints)   . History of hemorrhoids   . PANCREATITIS, HX OF 12/07/2006    Qualifier: Diagnosis of  By: Jimmye Norman, LPN, [place]   . History of shingles 07/27/2010   Patient Active Problem List   Diagnosis Date Noted  . Atrial fibrillation 12/07/2008    Priority: High  . Gout 01/09/2014    Priority: Medium  . 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 Former smoker 06/23/2007    Priority: Medium  . HYPERLIPIDEMIA 02/09/2007    Priority: Medium  . HYPERTENSION 12/07/2006    Priority: Medium  . COPD 12/07/2006    Priority: Medium  . BENIGN PROSTATIC HYPERTROPHY 12/07/2006    Priority: Medium  . Allergic rhinitis 08/15/2013    Priority: Low  . Feeling poorly 08/15/2013    Priority: Low  . 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 INGUINAL  HERNIA, RIGHT, SMALL 03/21/2010    Priority: Low  . BASAL CELL CARCINOMA SKIN LOWER LIMB INCL HIP 03/21/2010    Priority: Low  . Actinic keratosis 12/07/2008    Priority: Low  . OSTEOARTHRITIS 02/09/2007    Priority: Low  . PEPTIC ULCER DISEASE 12/07/2006    Priority: Low  . URINARY INCONTINENCE 12/07/2006    Priority: Low  . PANCREATITIS, HX OF 12/07/2006    Priority: Low   Past Surgical History  Procedure Laterality Date  . Cholecystectomy    . Tonsillectomy      Family History  Problem Relation Age of Onset  . Lymphoma    . Stroke      1st degree relative  . Colon cancer Neg Hx   . Esophageal cancer Neg Hx   . Rectal cancer Neg Hx   . Stomach cancer Neg Hx     Medications- reviewed and updated Current Outpatient Prescriptions  Medication Sig Dispense Refill  . allopurinol (ZYLOPRIM) 300 MG tablet TAKE ONE TABLET BY MOUTH ONCE DAILY  30 tablet  5  . COLCRYS 0.6 MG tablet TAKE ONE TABLET BY MOUTH TWICE DAILY AS NEEDED  60 tablet  0  . fish oil-omega-3 fatty acids 1000 MG capsule Take 1 g by mouth daily.      . metoprolol (LOPRESSOR) 50 MG tablet Take 0.5 tablets (25 mg total) by mouth 2 (two) times daily.  30 tablet  11  . Multiple Vitamin (MULTIVITAMIN) tablet Take 1 tablet by  mouth daily.      [name] triamterene-hydrochlorothiazide (MAXZIDE-25) 37.5-25 MG per tablet TAKE ONE-HALF TABLET BY MOUTH ONCE DAILY  30 tablet  5   No current facility-administered medications for this visit.    Allergies-reviewed and updated Allergies  Allergen Reactions  . Flexeril [Cyclobenzaprine Hcl]     The 10 mg dose   caused altered mental status    History   Social History  . Marital Status: Married    Spouse Name: N/A    Number of Children: 6  . Years of Education: N/A   Occupational History  . Retired    Social History Main Topics  . Smoking status: Former Smoker    Quit date: 03/20/2008  . Smokeless tobacco: Never Used  . Alcohol Use: No  . Drug Use: No  . Sexual  Activity: Yes   Other Topics Concern  . Not on file   Social History Narrative   Married. 6 kids. Wife Joycelyn Schmid also a patient of Dr. Ronney Lion.       Retired.     ROS--See HPI   Objective: BP 140/78  Pulse 60  Temp(Src) 97.3 F (36.3 C)  Wt 160 lb (72.576 kg) Gen: NAD, resting comfortably HEENT: Mucous membranes are moist. Oropharynx normal Neck: no thyromegaly CV: RRR no murmurs rubs or gallops Lungs: CTAB no crackles, wheeze, rhonchi Abdomen: soft/nontender/nondistended/normal bowel sounds. No rebound or guarding.  Ext: no edema Skin: warm, dry Neuro: grossly normal, moves all extremities, PERRLA  Assessment/Plan:  Atrial fibrillation In a fib today. Chads2 score of 2-start ASA to reduce stroke risk. Discussed coumadin/xarelto but patient opts not for these agents. If he were to have an event incl. TIA or CVA. he agrees to start one of these.   HYPERTENSION Goal SBP <150/90. Continue triamterine-hctz and metoprolol.   Gout Controlled. Continue allopurinol. Uric acid in 6 months with next set of labs.   Actinic keratosis Noted on right cheek. Cryotherapy applied today. Aftercare instructions discussed.     No orders of the defined types were placed in this encounter.    Meds ordered this encounter  Medications  . metoprolol (LOPRESSOR) 50 MG tablet    Sig: Take 0.5 tablets (25 mg total) by mouth 2 (two) times daily.    Dispense:  30 tablet    Refill:  11  . triamterene-hydrochlorothiazide (MAXZIDE-25) 37.5-25 MG per tablet    Sig: TAKE ONE-HALF TABLET BY MOUTH ONCE DAILY    Dispense:  30 tablet    Refill:  5

## 2014-01-09 NOTE — Assessment & Plan Note (Signed)
Controlled. Continue allopurinol. Uric acid in 6 months with next set of labs.

## 2014-01-09 NOTE — Assessment & Plan Note (Signed)
Noted on right cheek. Cryotherapy applied today. Aftercare instructions discussed.

## 2014-02-14 ENCOUNTER — Encounter: Payer: Self-pay | Admitting: Family Medicine

## 2014-02-14 ENCOUNTER — Ambulatory Visit (INDEPENDENT_AMBULATORY_CARE_PROVIDER_SITE_OTHER): Payer: Medicare Other | Admitting: Family Medicine

## 2014-02-14 VITALS — BP 126/86 | Temp 97.5°F | Wt 158.0 lb

## 2014-02-14 DIAGNOSIS — K4091 Unilateral inguinal hernia, without obstruction or gangrene, recurrent: Secondary | ICD-10-CM

## 2014-02-14 NOTE — Patient Instructions (Signed)
Refer to surgery for second opinion on if you should have this repaired. I would lean toward not having it repaired given other medical problems and size (with hope that the size actually makes strangulation less likely).   If we opt for surgery, we will need to send you to cardiology for clearance

## 2014-02-14 NOTE — Progress Notes (Signed)
William Reddish, MD Phone: [phone]  Subjective:   William Mahoney is a 80 y.o. year old very pleasant male patient who presents with the following:  Right groin Pain with history of inguinal hernia -more active over last few months as moving homes (downsizing) so doing a lot of heavy lifting. Knows he has a hernia since 2011 at least. Has been having more discomfort as being more active and lifting. Feels a bulge in the right side and pops it back in as needed. Pain 5/10 or less when it pops out. Pain resolves when able to get it back in. Pain is slightly improved as he has been less active in recent days but worsens anytime with increased activity.  Saw Dr. Fanny Skates for colorectal cancer and prefers to see him again for potential repair.   ROS-normal bowel movements. No nausea or vomiting  Past Medical History- Patient Active Problem List   Diagnosis Date Noted  . Atrial fibrillation 12/07/2008    Priority: High  . Gout 01/09/2014    Priority: Medium  . 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 Former smoker 06/23/2007    Priority: Medium  . HYPERLIPIDEMIA 02/09/2007    Priority: Medium  . HYPERTENSION 12/07/2006    Priority: Medium  . COPD 12/07/2006    Priority: Medium  . BENIGN PROSTATIC HYPERTROPHY 12/07/2006    Priority: Medium  . Allergic rhinitis 08/15/2013    Priority: Low  . Feeling poorly 08/15/2013    Priority: Low  . 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 Inguinal hernia 03/21/2010    Priority: Low  . BASAL CELL CARCINOMA SKIN LOWER LIMB INCL HIP 03/21/2010    Priority: Low  . Actinic keratosis 12/07/2008    Priority: Low  . OSTEOARTHRITIS 02/09/2007    Priority: Low  . PEPTIC ULCER DISEASE 12/07/2006    Priority: Low  . URINARY INCONTINENCE 12/07/2006    Priority: Low  . PANCREATITIS, HX OF 12/07/2006    Priority: Low   Medications- reviewed and updated Current Outpatient  Prescriptions  Medication Sig Dispense Refill  . allopurinol (ZYLOPRIM) 300 MG tablet TAKE ONE TABLET BY MOUTH ONCE DAILY  30 tablet  5  . COLCRYS 0.6 MG tablet TAKE ONE TABLET BY MOUTH TWICE DAILY AS NEEDED  60 tablet  0  . fish oil-omega-3 fatty acids 1000 MG capsule Take 1 g by mouth daily.      . metoprolol (LOPRESSOR) 50 MG tablet Take 0.5 tablets (25 mg total) by mouth 2 (two) times daily.  30 tablet  11  . Multiple Vitamin (MULTIVITAMIN) tablet Take 1 tablet by mouth daily.      [name] triamterene-hydrochlorothiazide (MAXZIDE-25) 37.5-25 MG per tablet TAKE ONE-HALF TABLET BY MOUTH ONCE DAILY  30 tablet  5   No current facility-administered medications for this visit.    Objective: BP 126/86  Temp(Src) 97.5 F (36.4 C)  Wt 158 lb (71.668 kg) Gen: NAD, resting comfortably CV: irregularly irregular with normal rate, no murmurs, rubs or gallops Lungs: CTAB no crackles, wheeze, rhonchi Abdomen: soft/nontender/nondistended/normal bowel sounds.  GU: 9x6 cm inguinal hernia with visible bulge.  Ext: no edema Skin: warm, dry Neuro: grossly normal, moves all extremities   Assessment/Plan:  Inguinal hernia We discussed that hernias have a tendency to get larger with time. At current size hopeful incarceration would not be an issue now or in the future (discussed case with Dr. Shawna Orleans who suggested in elderly patients  with rather large hernias his experience has been lower reats of incarceration). The hernia is certainly causing patient discomfort though and reasonable to consider surgery. Patient has multiple medical problems including atrial fibrillation only on aspirin as resistant to Coumadin or NOAC. I discussed with him the surgery would certainly carry risk and I would advise cardiology referral for preoperative clearance. Patient would at least like to get the opinion of Dr. Dalbert Batman who saw him for his colorectal cancer. I have placed a referral at this time and await Dr. Darrel Hoover  thoughts   Return precautions advised.   Orders Placed This Encounter  Procedures  . Ambulatory referral to General Surgery    Referral Priority:  Routine    Referral Type:  Surgical    Referral Reason:  Specialty Services Required    Referred to Provider:  Fanny Skates, MD    Requested Specialty:  General Surgery    Number of Visits Requested:  1

## 2014-02-14 NOTE — Assessment & Plan Note (Signed)
We discussed that hernias have a tendency to get larger with time. At current size hopeful incarceration would not be an issue now or in the future (discussed case with Dr. Shawna Orleans who suggested in elderly patients with rather large hernias his experience has been lower reats of incarceration). The hernia is certainly causing patient discomfort though and reasonable to consider surgery. Patient has multiple medical problems including atrial fibrillation only on aspirin as resistant to Coumadin or NOAC. I discussed with him the surgery would certainly carry risk and I would advise cardiology referral for preoperative clearance. Patient would at least like to get the opinion of Dr. Dalbert Batman who saw him for his colorectal cancer. I have placed a referral at this time and await Dr. Darrel Hoover thoughts

## 2014-02-28 ENCOUNTER — Other Ambulatory Visit (INDEPENDENT_AMBULATORY_CARE_PROVIDER_SITE_OTHER): Payer: Self-pay | Admitting: General Surgery

## 2014-03-16 ENCOUNTER — Encounter (HOSPITAL_COMMUNITY): Payer: Self-pay

## 2014-03-16 ENCOUNTER — Encounter (HOSPITAL_COMMUNITY)
Admission: RE | Admit: 2014-03-16 | Discharge: 2014-03-16 | Disposition: A | Discharge status: Home / Self Care | Payer: Medicare Other | Source: Ambulatory Visit | Attending: General Surgery | Admitting: General Surgery

## 2014-03-16 DIAGNOSIS — I10 Essential (primary) hypertension: Secondary | ICD-10-CM | POA: Insufficient documentation

## 2014-03-16 DIAGNOSIS — I4891 Unspecified atrial fibrillation: Secondary | ICD-10-CM | POA: Insufficient documentation

## 2014-03-16 DIAGNOSIS — I739 Peripheral vascular disease, unspecified: Secondary | ICD-10-CM | POA: Insufficient documentation

## 2014-03-16 DIAGNOSIS — I4519 Other right bundle-branch block: Secondary | ICD-10-CM | POA: Insufficient documentation

## 2014-03-16 DIAGNOSIS — K859 Acute pancreatitis, unspecified: Secondary | ICD-10-CM | POA: Insufficient documentation

## 2014-03-16 DIAGNOSIS — K7030 Alcoholic cirrhosis of liver without ascites: Secondary | ICD-10-CM | POA: Insufficient documentation

## 2014-03-16 DIAGNOSIS — F1021 Alcohol dependence, in remission: Secondary | ICD-10-CM | POA: Insufficient documentation

## 2014-03-16 DIAGNOSIS — Z01818 Encounter for other preprocedural examination: Secondary | ICD-10-CM | POA: Diagnosis present

## 2014-03-16 DIAGNOSIS — E785 Hyperlipidemia, unspecified: Secondary | ICD-10-CM | POA: Insufficient documentation

## 2014-03-16 DIAGNOSIS — I340 Nonrheumatic mitral (valve) insufficiency: Secondary | ICD-10-CM | POA: Insufficient documentation

## 2014-03-16 DIAGNOSIS — C189 Malignant neoplasm of colon, unspecified: Secondary | ICD-10-CM | POA: Insufficient documentation

## 2014-03-16 HISTORY — DX: Unspecified macular degeneration: H35.30

## 2014-03-16 HISTORY — DX: Essential (primary) hypertension: I10

## 2014-03-16 LAB — CBC WITH DIFFERENTIAL/PLATELET
Basophils Absolute: 0 10*3/uL (ref 0.0–0.1)
Basophils Relative: 1 % (ref 0–1)
Eosinophils Absolute: 0.1 10*3/uL (ref 0.0–0.7)
Eosinophils Relative: 2 % (ref 0–5)
HEMATOCRIT: 46.7 % (ref 39.0–52.0)
Hemoglobin: 15.4 g/dL (ref 13.0–17.0)
LYMPHS PCT: 26 % (ref 12–46)
Lymphs Abs: 1.4 10*3/uL (ref 0.7–4.0)
MCH: 30.4 pg (ref 26.0–34.0)
MCHC: 33 g/dL (ref 30.0–36.0)
MCV: 92.1 fL (ref 78.0–100.0)
Monocytes Absolute: 0.6 10*3/uL (ref 0.1–1.0)
Monocytes Relative: 10 % (ref 3–12)
NEUTROS ABS: 3.4 10*3/uL (ref 1.7–7.7)
Neutrophils Relative %: 61 % (ref 43–77)
PLATELETS: 123 10*3/uL — AB (ref 150–400)
RBC: 5.07 MIL/uL (ref 4.22–5.81)
RDW: 14.8 % (ref 11.5–15.5)
WBC: 5.5 10*3/uL (ref 4.0–10.5)

## 2014-03-16 LAB — COMPREHENSIVE METABOLIC PANEL
ALT: 19 U/L (ref 0–53)
AST: 33 U/L (ref 0–37)
Albumin: 3.9 g/dL (ref 3.5–5.2)
Alkaline Phosphatase: 80 U/L (ref 39–117)
Anion gap: 15 (ref 5–15)
BILIRUBIN TOTAL: 0.4 mg/dL (ref 0.3–1.2)
BUN: 20 mg/dL (ref 6–23)
CHLORIDE: 101 meq/L (ref 96–112)
CO2: 28 mEq/L (ref 19–32)
Calcium: 9.5 mg/dL (ref 8.4–10.5)
Creatinine, Ser: 1.34 mg/dL (ref 0.50–1.35)
GFR calc Af Amer: 54 mL/min — ABNORMAL LOW (ref >90)
GFR, EST NON AFRICAN AMERICAN: 46 mL/min — AB (ref >90)
Glucose, Bld: 93 mg/dL (ref 70–99)
POTASSIUM: 3.9 meq/L (ref 3.7–5.3)
SODIUM: 144 meq/L (ref 137–147)
Total Protein: 7.1 g/dL (ref 6.0–8.3)

## 2014-03-16 NOTE — Progress Notes (Signed)
Pt's PCP is Dr. [name]. Last office visit note by Dr. Yong Channel he noted that he thought pt should have a cardiology clearance prior to surgery. Pt wanted to talk with Dr. Dalbert Batman first. I asked pt today if he had talked with Dr. Dalbert Batman and he said yes and that Dr. Dalbert Batman thought he was healthy enough to have the surgery.   When asked if ever has chest pain or sob, he stated that sometimes when he does "too much" he gets a "chest ache". Will go away with rest. I asked him when the last time he had that feeling he said a few months ago. Had a heart cath 15 years ago in [place] and states that was "good", no stents or PCI.

## 2014-03-16 NOTE — Pre-Procedure Instructions (Signed)
William Mahoney  03/16/2014   Your procedure is scheduled on:  Friday, March 23, 2014 at 9:00 AM.   Report to [facility] Entrance "A" Admitting Office at 7:00 AM.   Call this number if you have problems the morning of surgery: [phone]                Any questions prior to day of surgery, please call [phone].   Remember:   Do not eat food or drink liquids after midnight Thursday, 03/22/14.   Take these medicines the morning of surgery with A SIP OF WATER: Metoprolol (Lopressor)  Stop Fish Oil and Vitamins as of today.   Do not wear jewelry.  Do not wear lotions, powders, or cologne. You may wear deodorant.  Men may shave face and neck.  Do not bring valuables to the hospital.  [facility] is not responsible                  for any belongings or valuables.               Contacts, dentures or bridgework may not be worn into surgery.  Leave suitcase in the car. After surgery it may be brought to your room.  For patients admitted to the hospital, discharge time is determined by your                treatment team.               Patients discharged the day of surgery will not be allowed to drive home.    Special Instructions: [place] - Preparing for Surgery  Before surgery, you can play an important role.  Because skin is not sterile, your skin needs to be as free of germs as possible.  You can reduce the number of germs on you skin by washing with CHG (chlorahexidine gluconate) soap before surgery.  CHG is an antiseptic cleaner which kills germs and bonds with the skin to continue killing germs even after washing.  Please DO NOT use if you have an allergy to CHG or antibacterial soaps.  If your skin becomes reddened/irritated stop using the CHG and inform your nurse when you arrive at Short Stay.  Do not shave (including legs and underarms) for at least 48 hours prior to the first CHG shower.  You may shave your face.  Please follow these instructions  carefully:   1.  Shower with CHG Soap the night before surgery and the                                morning of Surgery.  2.  If you choose to wash your hair, wash your hair first as usual with your       normal shampoo.  3.  After you shampoo, rinse your hair and body thoroughly to remove the                      Shampoo.  4.  Use CHG as you would any other liquid soap.  You can apply chg directly       to the skin and wash gently with scrungie or a clean washcloth.  5.  Apply the CHG Soap to your body ONLY FROM THE NECK DOWN.        Do not use on open wounds or open sores.  Avoid contact with your eyes, ears,  mouth and genitals (private parts).  Wash genitals (private parts) with your normal soap.  6.  Wash thoroughly, paying special attention to the area where your surgery        will be performed.  7.  Thoroughly rinse your body with warm water from the neck down.  8.  DO NOT shower/wash with your normal soap after using and rinsing off       the CHG Soap.  9.  Pat yourself dry with a clean towel.            10.  Wear clean pajamas.            11.  Place clean sheets on your bed the night of your first shower and do not        sleep with pets.  Day of Surgery  Do not apply any lotions the morning of surgery.  Please wear clean clothes to the hospital.    Please read over the following fact sheets that you were given: Pain Booklet, Coughing and Deep Breathing and Surgical Site Infection Prevention

## 2014-03-16 NOTE — Pre-Procedure Instructions (Signed)
William Mahoney  03/16/2014   Your procedure is scheduled on:  Friday, March 23, 2014 at 9:00 AM.   Report to [facility] Entrance "A" Admitting Office at 7:00 AM.   Call this number if you have problems the morning of surgery: [phone]   Remember:   Do not eat food or drink liquids after midnight Thursday, 03/22/14.   Take these medicines the morning of surgery with A SIP OF WATER: Metoprolol (Lopressor)  Stop Fish Oil and Vitamins as of today.   Do not wear jewelry.  Do not wear lotions, powders, or cologne. You may wear deodorant.  Men may shave face and neck.  Do not bring valuables to the hospital.  [facility] is not responsible                  for any belongings or valuables.               Contacts, dentures or bridgework may not be worn into surgery.  Leave suitcase in the car. After surgery it may be brought to your room.  For patients admitted to the hospital, discharge time is determined by your                treatment team.               Patients discharged the day of surgery will not be allowed to drive home.    Special Instructions: [place] - Preparing for Surgery  Before surgery, you can play an important role.  Because skin is not sterile, your skin needs to be as free of germs as possible.  You can reduce the number of germs on you skin by washing with CHG (chlorahexidine gluconate) soap before surgery.  CHG is an antiseptic cleaner which kills germs and bonds with the skin to continue killing germs even after washing.  Please DO NOT use if you have an allergy to CHG or antibacterial soaps.  If your skin becomes reddened/irritated stop using the CHG and inform your nurse when you arrive at Short Stay.  Do not shave (including legs and underarms) for at least 48 hours prior to the first CHG shower.  You may shave your face.  Please follow these instructions carefully:   1.  Shower with CHG Soap the night before surgery and the                                 morning of Surgery.  2.  If you choose to wash your hair, wash your hair first as usual with your       normal shampoo.  3.  After you shampoo, rinse your hair and body thoroughly to remove the                      Shampoo.  4.  Use CHG as you would any other liquid soap.  You can apply chg directly       to the skin and wash gently with scrungie or a clean washcloth.  5.  Apply the CHG Soap to your body ONLY FROM THE NECK DOWN.        Do not use on open wounds or open sores.  Avoid contact with your eyes, ears, mouth and genitals (private parts).  Wash genitals (private parts) with your normal soap.  6.  Wash thoroughly, paying special attention to the area  where your surgery        will be performed.  7.  Thoroughly rinse your body with warm water from the neck down.  8.  DO NOT shower/wash with your normal soap after using and rinsing off       the CHG Soap.  9.  Pat yourself dry with a clean towel.            10.  Wear clean pajamas.            11.  Place clean sheets on your bed the night of your first shower and do not        sleep with pets.  Day of Surgery  Do not apply any lotions the morning of surgery.  Please wear clean clothes to the hospital.    Please read over the following fact sheets that you were given: Pain Booklet, Coughing and Deep Breathing and Surgical Site Infection Prevention

## 2014-03-19 ENCOUNTER — Other Ambulatory Visit (INDEPENDENT_AMBULATORY_CARE_PROVIDER_SITE_OTHER): Payer: Self-pay

## 2014-03-19 DIAGNOSIS — I4891 Unspecified atrial fibrillation: Secondary | ICD-10-CM

## 2014-03-19 NOTE — Progress Notes (Addendum)
Anesthesia Chart Review:  Pt is 80 year old male scheduled for open R inguinal hernia repair on 03/23/2014 with Dr. Dalbert Batman.   PMH: atrial fibrillation, HTN, PVD, hyperlipidemia, hx alcohol abuse, alcoholic cirrhosis, pancreatitis, colon cancer.   Medications include: metoprolol, maxzide, colchicine, allopurinol.   Preoperative labs reviewed.  Platelets 123.   EKG: Atrial fibrillation with slow ventricular response. Incomplete RBBB. Low voltage QRS. Nonspecific ST-T changes.   2D echo 05/11/2008: - The left ventricle was small. Overall left ventricular systolic function was normal. Left ventricular ejection fraction was estimated, range being 60 % to 65 %. There was no diagnostic evidence of left ventricular regional wall motion abnormalities. Features were consistent with a pseudonormal left ventricular filling pattern, with concomitant abnormal relaxation and increased filling pressure. - The aortic root was at the upper limits of normal in size. - There was mild to moderate mitral annular calcification. There was mild to moderate mitral valvular regurgitation. The effective orifice of mitral regurgitation by proximal isovelocity surface area was 0.09 cm^2. The volume of mitral regurgitation by proximal isovelocity surface area was 15 cc. - The left atrium was mildly dilated. - The estimated peak pulmonary artery systolic pressure was mildly increased. - The right atrium was mildly dilated.  Pt reported in PAT to RN he sometimes gets a "chest ache" when he does "too much". Rest resolves sx. Last episode a few months ago. Pt also reported a normal heart cath 15 years ago in [place].   Last office visit note from PCP Dr. Yong Channel in [place] 02/14/2014 indicates Dr. Yong Channel recommended cardiac clearance prior to surgery. This was not obtained.   Discussed with Dr. Linna Caprice. Pt will need cardiac clearance prior to surgery. Notified triage RN in Dr. Darrel Hoover office.   Willeen Cass,  FNP-BC [facility] Short Stay Surgical Center/Anesthesiology Phone: [phone] 03/19/2014 3:36 PM   Addendum: Pt saw Dr. Gwenlyn Found today. Has cardiac clearance for surgery.   Willeen Cass, FNP-BC [facility] Short Stay Surgical Center/Anesthesiology Phone: [phone] 03/22/2014 5:09 PM

## 2014-03-21 NOTE — H&P (Signed)
William Mahoney  Location: [facility] Patient #: [id number] DOB: [birth year] Married / Language: English / Race: White Male       History of Present Illness  Patient words: eval for possible right inguinal hernia.  The patient is a 80 year old male who presents with an inguinal hernia. This 80 year old gentleman is referred by Dr. [name] for evaluation of a right inguinal hernia. The patient has not had any hernia surgery in the past. He has noted that he had a bulge in the right groin for 3 or 4 years. It has been bothering him a little bit more lately but is not severe. He has to push it back and sometimes at it's a little uncomfortable. Never incarcerated. Past history is significant for laparoscopic assisted low anterior resection by me in 2009. He had T3 N0 cancer. He was followed by Dr. Benay Spice for 5 or 6 years but has now been discharged from his care. last colonoscopy by Dr. Oretha Caprice in 2014, reportedly clear. No evidence of recurrence. Paroxysmal atrial fibrillation on aspirin. Hypertension. Gout. COPD and former smoker. Status post cholecystectomy in [place]. Generally health has been stable and he is active. He is here today with his wife.   Other Problems Alcohol Abuse Arthritis Atrial Fibrillation Bladder Problems Cholelithiasis Cirrhosis Of Liver Colon Cancer Diverticulosis Enlarged Prostate Hemorrhoids High blood pressure Inguinal Hernia Melanoma Rectal Cancer  Past Surgical History  Cataract Surgery Bilateral. Colon Polyp Removal - Colonoscopy Colon Removal - Partial Gallbladder Surgery - Laparoscopic Tonsillectomy  Diagnostic Studies History Colonoscopy 1-5 years ago  Allergies  Flexeril *MUSCULOSKELETAL THERAPY AGENTS*  Medication History  Allopurinol (300MG  Tablet, Oral qd) Active. Metoprolol Tartrate (50MG  Tablet, Oral qd) Active. Triamterene-HCTZ (37.5-25MG  Tablet, Oral qd)  Active. Colcrys (0.6MG  Tablet, Oral prn) Active. Fish Oil Burp-Less (500MG  Capsule, Oral qd) Active. Multivitamins (Oral qd) Active.  Social History  Alcohol use Remotely quit alcohol use. Caffeine use Coffee. No drug use Tobacco use Former smoker.  Family History  Cancer Sister. Melanoma Daughter.  Review of Systems General Not Present- Appetite Loss, Chills, Fatigue, Fever, Night Sweats, Weight Gain and Weight Loss. Skin Present- Dryness. Not Present- Change in Wart/Mole, Hives, Jaundice, New Lesions, Non-Healing Wounds, Rash and Ulcer. HEENT Present- Hearing Loss, Seasonal Allergies and Wears glasses/contact lenses. Not Present- Earache, Hoarseness, Nose Bleed, Oral Ulcers, Ringing in the Ears, Sinus Pain, Sore Throat, Visual Disturbances and Yellow Eyes. Respiratory Not Present- Bloody sputum, Chronic Cough, Difficulty Breathing, Snoring and Wheezing. Breast Not Present- Breast Mass, Breast Pain, Nipple Discharge and Skin Changes. Cardiovascular Present- Leg Cramps. Not Present- Chest Pain, Difficulty Breathing Lying Down, Palpitations, Rapid Heart Rate, Shortness of Breath and Swelling of Extremities. Gastrointestinal Present- Abdominal Pain and Excessive gas. Not Present- Bloating, Bloody Stool, Change in Bowel Habits, Chronic diarrhea, Constipation, Difficulty Swallowing, Gets full quickly at meals, Hemorrhoids, Indigestion, Nausea, Rectal Pain and Vomiting. Male Genitourinary Present- Urine Leakage. Not Present- Blood in Urine, Change in Urinary Stream, Frequency, Impotence, Nocturia, Painful Urination and Urgency. Musculoskeletal Present- Back Pain and Joint Pain. Not Present- Joint Stiffness, Muscle Pain, Muscle Weakness and Swelling of Extremities. Neurological Not Present- Decreased Memory, Fainting, Headaches, Numbness, Seizures, Tingling, Tremor, Trouble walking and Weakness. Psychiatric Not Present- Anxiety, Bipolar, Change in Sleep Pattern, Depression, Fearful and  Frequent crying. Endocrine Present- Cold Intolerance. Not Present- Excessive Hunger, Hair Changes, Heat Intolerance, Hot flashes and New Diabetes. Hematology Present- Easy Bruising. Not Present- Excessive bleeding, Gland problems, HIV and Persistent Infections.   Vitals 02/27/2014  9:58 AM Weight: 158 lb Height: 70in Body Surface Area: 1.88 m Body Mass Index: 22.67 kg/m Temp.: 97.57F(Oral)  Pulse: 56 (Regular)  Resp.: 14 (Unlabored)  BP: 142/90 (Sitting, Left Arm, Standard)    Physical Exam General Mental Status-Alert. General Appearance-Consistent with stated age. Hydration-Well hydrated. Voice-Normal.  Head and Neck Head-normocephalic, atraumatic with no lesions or palpable masses. Trachea-midline. Thyroid Gland Characteristics - normal size and consistency.  Eye Eyeball - Bilateral-Extraocular movements intact. Sclera/Conjunctiva - Bilateral-No scleral icterus.  Chest and Lung Exam Chest and lung exam reveals -quiet, even and easy respiratory effort with no use of accessory muscles and on auscultation, normal breath sounds, no adventitious sounds and normal vocal resonance. Inspection Chest Wall - Normal. Back - normal.  Breast Breast - Left-Symmetric, Non Tender, No Biopsy scars, no Dimpling, No Inflammation, No Lumpectomy scars, No Mastectomy scars, No Peau d' Orange. Breast - Right-Symmetric, Non Tender, No Biopsy scars, no Dimpling, No Inflammation, No Lumpectomy scars, No Mastectomy scars, No Peau d' Orange. Breast Lump-No Palpable Breast Mass.  Cardiovascular Cardiovascular examination reveals -normal heart sounds, regular rate and rhythm with no murmurs and normal pedal pulses bilaterally.  Abdomen Inspection Inspection of the abdomen reveals - No Hernias. Palpation/Percussion Palpation and Percussion of the abdomen reveal - Soft, Non Tender, No Rebound tenderness, No Rigidity (guarding) and No  hepatosplenomegaly. Auscultation Auscultation of the abdomen reveals - Bowel sounds normal. Note: Abdomen soft. Scaphoid. Nontender. Liver and spleen not enlarged. Incision is well-healed without hernia.   Male Genitourinary Note: He has stly reducibleium size right inguinal hernia. Mostly reducible and no scrotal or testicular mass.rnia on the left. No scrotal or testicular mass.   Neurologic Neurologic evaluation reveals -alert and oriented x 3 with no impairment of recent or remote memory. Mental Status-Normal.  Musculoskeletal Normal Exam - Left-Upper Extremity Strength Normal and Lower Extremity Strength Normal. Normal Exam - Right-Upper Extremity Strength Normal and Lower Extremity Strength Normal.  Lymphatic Head & Neck  General Head & Neck Lymphatics: Bilateral - Description - Normal. Axillary  General Axillary Region: Bilateral - Description - Normal. Tenderness - Non Tender. Femoral & Inguinal  Generalized Femoral & Inguinal Lymphatics: Bilateral - Description - Normal. Tenderness - Non Tender.    Assessment & Plan  RIGHT INGUINAL HERNIA (550.90  K40.90) Current Plans  Follow up as needed You have a right inguinal hernia that is reducible. This is beginning to become symptomatic but there is no immediate urgency for operation. You may choose to have the surgery at any time, or you may wait and see depending on symptoms. We have discussed operative technique and risks. please read the patient information booklet. Please call Dr. Dalbert Batman when you have decided that you want to have this surgery.  HISTORY OF COLON CANCER, STAGE II (V10.05  Z85.038) Impression: no evidence of recurrence, 6 years following low anterior resection  PAROXYSMAL ATRIAL FIBRILLATION (427.31  I48.0) Impression: on ASA only  HYPERTENSION, BENIGN (401.1  I10)  COPD, MILD (496  J44.9)  FORMER SMOKER (C16.60  Y30.160)    Edsel Petrin. Dalbert Batman, M.D., [facility] General and Minimally invasive Surgery Breast and Colorectal Surgery Office:   [phone] Pager:   [phone]

## 2014-03-22 ENCOUNTER — Encounter: Payer: Self-pay | Admitting: Cardiovascular Disease

## 2014-03-22 ENCOUNTER — Ambulatory Visit (INDEPENDENT_AMBULATORY_CARE_PROVIDER_SITE_OTHER): Payer: Medicare Other | Admitting: Cardiovascular Disease

## 2014-03-22 ENCOUNTER — Telehealth: Payer: Self-pay | Admitting: *Deleted

## 2014-03-22 VITALS — BP 151/92 | HR 58 | Ht 71.0 in | Wt 161.0 lb

## 2014-03-22 DIAGNOSIS — I10 Essential (primary) hypertension: Secondary | ICD-10-CM

## 2014-03-22 DIAGNOSIS — I482 Chronic atrial fibrillation, unspecified: Secondary | ICD-10-CM

## 2014-03-22 MED ORDER — CEFAZOLIN SODIUM-DEXTROSE 2-3 GM-% IV SOLR
2.0000 g | INTRAVENOUS | Status: AC
  Administered 2014-03-23: 2 g via INTRAVENOUS
  Filled 2014-03-22: qty 50

## 2014-03-22 NOTE — Telephone Encounter (Signed)
Tonya from Dr Darrel Hoover office called to see if William Mahoney has cardiac clearance for his hernia surgery tomorrow.  Dr Gwenlyn Found saw the patient and cleared patient for surgery tomorrow. Tonya aware.

## 2014-03-22 NOTE — Patient Instructions (Signed)
Your physician wants you to follow-up in: 1 month with Erasmo Downer to discuss anticoagulation.    Your physician wants you to follow-up in: 1 year with Dr Gwenlyn Found. You will receive a reminder letter in the mail two months in advance. If you don't receive a letter, please call our office to schedule the follow-up appointment.

## 2014-03-22 NOTE — Progress Notes (Signed)
03/22/2014 [name]   [birth year]  [id number]  Primary Physician [name], MD Primary Cardiologist: Lorretta Harp MD Renae Gloss   HPI:  Mr. Macauley is a very pleasant 80 year old thin-appearing married Caucasian male father of 96 children, grandfather of 50 grandchildren referred for preoperative clearance before a elective right inguinal hernia repair scheduled for tomorrow by Dr. Dalbert Batman. His primary care physician is Dr. Yong Channel. His cardiovascular risk factor profile is notable for 90 pack years of tobacco abuse having quit 6 years ago. History of hypertension. He has never had a heart attack or stroke. He has had colon cancer which was surgically excised profile/31/09 after she stops smoking. He does have chronic atrial fibrillation only on aspirin though his CHA2DSVASC2 score is 2. He had a stress test 15 years ago which led to cardiac catheterization which was apparently normal. He denies chest pain or shortness of breath.   Current Outpatient Prescriptions  Medication Sig Dispense Refill  . allopurinol (ZYLOPRIM) 300 MG tablet Take 150 mg by mouth daily.    . colchicine 0.6 MG tablet Take 0.6 mg by mouth 2 (two) times daily as needed (gout).    . fish oil-omega-3 fatty acids 1000 MG capsule Take 1 g by mouth daily.    . metoprolol (LOPRESSOR) 50 MG tablet Take 0.5 tablets (25 mg total) by mouth 2 (two) times daily. 30 tablet 11  . Multiple Vitamin (MULTIVITAMIN) tablet Take 1 tablet by mouth daily.    [name] triamterene-hydrochlorothiazide (MAXZIDE-25) 37.5-25 MG per tablet TAKE ONE-HALF TABLET BY MOUTH ONCE DAILY (Patient taking differently: Take 0.5 tablets by mouth daily. ) 30 tablet 5   No current facility-administered medications for this visit.   Facility-Administered Medications Ordered in Other Visits  Medication Dose Route Frequency Provider Last Rate Last Dose  . [START ON 03/23/2014] ceFAZolin (ANCEF) IVPB 2 g/50 mL premix  2 g Intravenous On Call to  Dudley, MD        Allergies  Allergen Reactions  . Flexeril [Cyclobenzaprine Hcl]     The 10 mg dose   caused altered mental status    History   Social History  . Marital Status: Married    Spouse Name: N/A    Number of Children: 6  . Years of Education: N/A   Occupational History  . Retired    Social History Main Topics  . Smoking status: Former Smoker    Quit date: 03/20/2008  . Smokeless tobacco: Never Used  . Alcohol Use: No     Comment: quit in 1980's (heavy drinker then)  . Drug Use: No  . Sexual Activity: Yes   Other Topics Concern  . Not on file   Social History Narrative   Married. 6 kids. Wife Joycelyn Schmid also a patient of Dr. Ronney Lion.       Retired.      Review of Systems: General: negative for chills, fever, night sweats or weight changes.  Cardiovascular: negative for chest pain, dyspnea on exertion, edema, orthopnea, palpitations, paroxysmal nocturnal dyspnea or shortness of breath Dermatological: negative for rash Respiratory: negative for cough or wheezing Urologic: negative for hematuria Abdominal: negative for nausea, vomiting, diarrhea, bright red blood per rectum, melena, or hematemesis Neurologic: negative for visual changes, syncope, or dizziness All other systems reviewed and are otherwise negative except as noted above.    Blood pressure 151/92, pulse 58, height 5\' 11"  (1.803 m), weight 161 lb (73.029 kg).  General appearance: alert and no distress Neck:  no adenopathy, no carotid bruit, no JVD, supple, symmetrical, trachea midline and thyroid not enlarged, symmetric, no tenderness/mass/nodules Lungs: clear to auscultation bilaterally Heart: irregularly irregular rhythm Extremities: extremities normal, atraumatic, no cyanosis or edema  EKG atrial fibrillation with a ventricular response of 51 and right bundle branch block with low limb voltage. I personally reviewed this EKG  ASSESSMENT AND PLAN:   Essential  hypertension History of hypertension with blood pressure measured today at 151/92. He is on Maxzide and Lopressor. Continue current medicines at current dosing  Atrial fibrillation History of chronic atrial fibrillation, rate controlled on metoprolol. He is only on aspirin. His CHA2DSVASC2 score is 2 suggesting that he would benefit from oral anticoagulation. His wife is on Xarelto . I'm going to have him follow-up with Cyril Mourning to decide which NOAC  would be most suitable for him.      Lorretta Harp MD FACP,FACC,FAHA, [facility] 03/22/2014 4:45 PM

## 2014-03-22 NOTE — Assessment & Plan Note (Signed)
History of chronic atrial fibrillation, rate controlled on metoprolol. He is only on aspirin. His CHA2DSVASC2 score is 2 suggesting that he would benefit from oral anticoagulation. His wife is on Xarelto . I'm going to have him follow-up with Cyril Mourning to decide which NOAC  would be most suitable for him.

## 2014-03-22 NOTE — Assessment & Plan Note (Signed)
History of hypertension with blood pressure measured today at 151/92. He is on Maxzide and Lopressor. Continue current medicines at current dosing

## 2014-03-23 ENCOUNTER — Ambulatory Visit (HOSPITAL_COMMUNITY): Payer: Medicare Other | Admitting: Emergency Medicine

## 2014-03-23 ENCOUNTER — Encounter (HOSPITAL_COMMUNITY): Payer: Self-pay | Admitting: Anesthesiology

## 2014-03-23 ENCOUNTER — Encounter (HOSPITAL_COMMUNITY)
Admission: RE | Disposition: A | Discharge status: Home / Self Care | Payer: Self-pay | Source: Ambulatory Visit | Attending: General Surgery

## 2014-03-23 ENCOUNTER — Ambulatory Visit (HOSPITAL_COMMUNITY)
Admission: RE | Admit: 2014-03-23 | Discharge: 2014-03-23 | Disposition: A | Discharge status: Home / Self Care | Payer: Medicare Other | Source: Ambulatory Visit | Attending: General Surgery | Admitting: General Surgery

## 2014-03-23 ENCOUNTER — Ambulatory Visit (HOSPITAL_COMMUNITY): Payer: Medicare Other | Admitting: Anesthesiology

## 2014-03-23 DIAGNOSIS — Z86010 Personal history of colonic polyps: Secondary | ICD-10-CM | POA: Diagnosis not present

## 2014-03-23 DIAGNOSIS — N289 Disorder of kidney and ureter, unspecified: Secondary | ICD-10-CM | POA: Diagnosis not present

## 2014-03-23 DIAGNOSIS — N400 Enlarged prostate without lower urinary tract symptoms: Secondary | ICD-10-CM | POA: Diagnosis not present

## 2014-03-23 DIAGNOSIS — K7460 Unspecified cirrhosis of liver: Secondary | ICD-10-CM | POA: Diagnosis not present

## 2014-03-23 DIAGNOSIS — K649 Unspecified hemorrhoids: Secondary | ICD-10-CM | POA: Insufficient documentation

## 2014-03-23 DIAGNOSIS — F1010 Alcohol abuse, uncomplicated: Secondary | ICD-10-CM | POA: Diagnosis not present

## 2014-03-23 DIAGNOSIS — Z808 Family history of malignant neoplasm of other organs or systems: Secondary | ICD-10-CM | POA: Insufficient documentation

## 2014-03-23 DIAGNOSIS — Z809 Family history of malignant neoplasm, unspecified: Secondary | ICD-10-CM | POA: Diagnosis not present

## 2014-03-23 DIAGNOSIS — Z87891 Personal history of nicotine dependence: Secondary | ICD-10-CM | POA: Insufficient documentation

## 2014-03-23 DIAGNOSIS — K8020 Calculus of gallbladder without cholecystitis without obstruction: Secondary | ICD-10-CM | POA: Insufficient documentation

## 2014-03-23 DIAGNOSIS — Z888 Allergy status to other drugs, medicaments and biological substances status: Secondary | ICD-10-CM | POA: Diagnosis not present

## 2014-03-23 DIAGNOSIS — M109 Gout, unspecified: Secondary | ICD-10-CM | POA: Insufficient documentation

## 2014-03-23 DIAGNOSIS — K4090 Unilateral inguinal hernia, without obstruction or gangrene, not specified as recurrent: Secondary | ICD-10-CM | POA: Insufficient documentation

## 2014-03-23 DIAGNOSIS — M1990 Unspecified osteoarthritis, unspecified site: Secondary | ICD-10-CM | POA: Insufficient documentation

## 2014-03-23 DIAGNOSIS — C20 Malignant neoplasm of rectum: Secondary | ICD-10-CM | POA: Insufficient documentation

## 2014-03-23 DIAGNOSIS — I480 Paroxysmal atrial fibrillation: Secondary | ICD-10-CM | POA: Insufficient documentation

## 2014-03-23 DIAGNOSIS — Z9049 Acquired absence of other specified parts of digestive tract: Secondary | ICD-10-CM | POA: Diagnosis not present

## 2014-03-23 DIAGNOSIS — C439 Malignant melanoma of skin, unspecified: Secondary | ICD-10-CM | POA: Diagnosis not present

## 2014-03-23 DIAGNOSIS — K5790 Diverticulosis of intestine, part unspecified, without perforation or abscess without bleeding: Secondary | ICD-10-CM | POA: Insufficient documentation

## 2014-03-23 DIAGNOSIS — I10 Essential (primary) hypertension: Secondary | ICD-10-CM | POA: Insufficient documentation

## 2014-03-23 HISTORY — PX: INSERTION OF MESH: SHX5868

## 2014-03-23 HISTORY — PX: INGUINAL HERNIA REPAIR: SHX194

## 2014-03-23 SURGERY — REPAIR, HERNIA, INGUINAL, ADULT
Anesthesia: General | Site: Groin | Laterality: Right

## 2014-03-23 MED ORDER — CHLORHEXIDINE GLUCONATE 4 % EX LIQD
1.0000 "application " | Freq: Once | CUTANEOUS | Status: DC
  Filled 2014-03-23: qty 15

## 2014-03-23 MED ORDER — BUPIVACAINE-EPINEPHRINE 0.5% -1:200000 IJ SOLN
INTRAMUSCULAR | Status: DC | PRN
  Administered 2014-03-23: 30 mL

## 2014-03-23 MED ORDER — ONDANSETRON HCL 4 MG/2ML IJ SOLN
INTRAMUSCULAR | Status: DC | PRN
  Administered 2014-03-23: 4 mg via INTRAVENOUS

## 2014-03-23 MED ORDER — OXYCODONE HCL 5 MG PO TABS: 5.0000 mg | ORAL_TABLET | ORAL | Status: DC | PRN

## 2014-03-23 MED ORDER — SODIUM CHLORIDE 0.9 % IV SOLN: 250.0000 mL | INTRAVENOUS | Status: DC | PRN

## 2014-03-23 MED ORDER — EPHEDRINE SULFATE 50 MG/ML IJ SOLN
INTRAMUSCULAR | Status: DC | PRN
  Administered 2014-03-23: 5 mg via INTRAVENOUS
  Administered 2014-03-23: 10 mg via INTRAVENOUS

## 2014-03-23 MED ORDER — ROCURONIUM BROMIDE 50 MG/5ML IV SOLN
INTRAVENOUS | Status: AC
  Filled 2014-03-23: qty 1

## 2014-03-23 MED ORDER — 0.9 % SODIUM CHLORIDE (POUR BTL) OPTIME
TOPICAL | Status: DC | PRN
  Administered 2014-03-23: 1000 mL

## 2014-03-23 MED ORDER — FENTANYL CITRATE 0.05 MG/ML IJ SOLN: 25.0000 ug | INTRAMUSCULAR | Status: DC | PRN

## 2014-03-23 MED ORDER — MIDAZOLAM HCL 2 MG/2ML IJ SOLN
INTRAMUSCULAR | Status: AC
  Filled 2014-03-23: qty 2

## 2014-03-23 MED ORDER — DEXAMETHASONE SODIUM PHOSPHATE 10 MG/ML IJ SOLN
INTRAMUSCULAR | Status: AC
  Filled 2014-03-23: qty 1

## 2014-03-23 MED ORDER — SODIUM CHLORIDE 0.9 % IV SOLN: INTRAVENOUS | Status: DC

## 2014-03-23 MED ORDER — PROPOFOL 10 MG/ML IV BOLUS
INTRAVENOUS | Status: DC | PRN
  Administered 2014-03-23: 100 mg via INTRAVENOUS

## 2014-03-23 MED ORDER — FENTANYL CITRATE 0.05 MG/ML IJ SOLN
INTRAMUSCULAR | Status: AC
  Filled 2014-03-23: qty 5

## 2014-03-23 MED ORDER — MIDAZOLAM HCL 5 MG/5ML IJ SOLN
INTRAMUSCULAR | Status: DC | PRN
  Administered 2014-03-23: 1 mg via INTRAVENOUS

## 2014-03-23 MED ORDER — ATROPINE SULFATE 0.1 MG/ML IJ SOLN
INTRAMUSCULAR | Status: AC
  Filled 2014-03-23: qty 10

## 2014-03-23 MED ORDER — GLYCOPYRROLATE 0.2 MG/ML IJ SOLN
INTRAMUSCULAR | Status: AC
  Filled 2014-03-23: qty 4

## 2014-03-23 MED ORDER — NEOSTIGMINE METHYLSULFATE 10 MG/10ML IV SOLN
INTRAVENOUS | Status: DC | PRN
  Administered 2014-03-23: 2 mg via INTRAVENOUS

## 2014-03-23 MED ORDER — LIDOCAINE HCL (CARDIAC) 20 MG/ML IV SOLN
INTRAVENOUS | Status: AC
  Filled 2014-03-23: qty 5

## 2014-03-23 MED ORDER — NEOSTIGMINE METHYLSULFATE 10 MG/10ML IV SOLN
INTRAVENOUS | Status: AC
  Filled 2014-03-23: qty 1

## 2014-03-23 MED ORDER — LACTATED RINGERS IV SOLN
INTRAVENOUS | Status: DC
  Administered 2014-03-23 (×2): via INTRAVENOUS

## 2014-03-23 MED ORDER — ACETAMINOPHEN 650 MG RE SUPP: 650.0000 mg | RECTAL | Status: DC | PRN

## 2014-03-23 MED ORDER — DEXAMETHASONE SODIUM PHOSPHATE 10 MG/ML IJ SOLN
INTRAMUSCULAR | Status: DC | PRN
  Administered 2014-03-23: 4 mg via INTRAVENOUS

## 2014-03-23 MED ORDER — HYDROCODONE-ACETAMINOPHEN 5-325 MG PO TABS: 1.0000 | ORAL_TABLET | Freq: Four times a day (QID) | ORAL | Status: DC | PRN

## 2014-03-23 MED ORDER — PROPOFOL 10 MG/ML IV BOLUS
INTRAVENOUS | Status: AC
  Filled 2014-03-23: qty 20

## 2014-03-23 MED ORDER — ONDANSETRON HCL 4 MG/2ML IJ SOLN
INTRAMUSCULAR | Status: AC
  Filled 2014-03-23: qty 2

## 2014-03-23 MED ORDER — ATROPINE SULFATE 0.4 MG/ML IJ SOLN
INTRAMUSCULAR | Status: DC | PRN
  Administered 2014-03-23: .3 mg via INTRAVENOUS
  Administered 2014-03-23: .1 mg via INTRAVENOUS

## 2014-03-23 MED ORDER — LIDOCAINE HCL (CARDIAC) 20 MG/ML IV SOLN
INTRAVENOUS | Status: DC | PRN
  Administered 2014-03-23: 60 mg via INTRAVENOUS

## 2014-03-23 MED ORDER — FENTANYL CITRATE 0.05 MG/ML IJ SOLN
INTRAMUSCULAR | Status: DC | PRN
  Administered 2014-03-23: 50 ug via INTRAVENOUS
  Administered 2014-03-23: 100 ug via INTRAVENOUS

## 2014-03-23 MED ORDER — PHENYLEPHRINE HCL 10 MG/ML IJ SOLN
INTRAMUSCULAR | Status: DC | PRN
  Administered 2014-03-23: 80 ug via INTRAVENOUS

## 2014-03-23 MED ORDER — ACETAMINOPHEN 325 MG PO TABS: 650.0000 mg | ORAL_TABLET | ORAL | Status: DC | PRN

## 2014-03-23 MED ORDER — GLYCOPYRROLATE 0.2 MG/ML IJ SOLN
INTRAMUSCULAR | Status: DC | PRN
  Administered 2014-03-23: 0.4 mg via INTRAVENOUS
  Administered 2014-03-23: 0.2 mg via INTRAVENOUS

## 2014-03-23 MED ORDER — ROCURONIUM BROMIDE 100 MG/10ML IV SOLN
INTRAVENOUS | Status: DC | PRN
  Administered 2014-03-23: 30 mg via INTRAVENOUS
  Administered 2014-03-23: 10 mg via INTRAVENOUS

## 2014-03-23 MED ORDER — SODIUM CHLORIDE 0.9 % IJ SOLN: 3.0000 mL | INTRAMUSCULAR | Status: DC | PRN

## 2014-03-23 MED ORDER — EPHEDRINE SULFATE 50 MG/ML IJ SOLN
INTRAMUSCULAR | Status: AC
  Filled 2014-03-23: qty 1

## 2014-03-23 MED ORDER — SODIUM CHLORIDE 0.9 % IJ SOLN: 3.0000 mL | Freq: Two times a day (BID) | INTRAMUSCULAR | Status: DC

## 2014-03-23 SURGICAL SUPPLY — 50 items
BLADE SURG 10 STRL SS (BLADE) ×4 IMPLANT
BLADE SURG 15 STRL LF DISP TIS (BLADE) ×2 IMPLANT
BLADE SURG 15 STRL SS (BLADE) ×2
BLADE SURG ROTATE 9660 (MISCELLANEOUS) IMPLANT
CHLORAPREP W/TINT 26ML (MISCELLANEOUS) ×4 IMPLANT
COVER SURGICAL LIGHT HANDLE (MISCELLANEOUS) ×4 IMPLANT
DECANTER SPIKE VIAL GLASS SM (MISCELLANEOUS) ×4 IMPLANT
DERMABOND ADVANCED (GAUZE/BANDAGES/DRESSINGS) ×2
DERMABOND ADVANCED .7 DNX12 (GAUZE/BANDAGES/DRESSINGS) ×2 IMPLANT
DRAIN PENROSE 1/2X12 LTX STRL (WOUND CARE) ×4 IMPLANT
DRAPE LAPAROTOMY TRNSV 102X78 (DRAPE) ×4 IMPLANT
ELECT CAUTERY BLADE 6.4 (BLADE) ×4 IMPLANT
ELECT REM PT RETURN 9FT ADLT (ELECTROSURGICAL) ×4
ELECTRODE REM PT RTRN 9FT ADLT (ELECTROSURGICAL) ×2 IMPLANT
GLOVE BIO SURGEON STRL SZ7 (GLOVE) ×8 IMPLANT
GLOVE BIOGEL PI IND STRL 7.0 (GLOVE) ×4 IMPLANT
GLOVE BIOGEL PI INDICATOR 7.0 (GLOVE) ×4
GLOVE EUDERMIC 7 POWDERFREE (GLOVE) ×4 IMPLANT
GLOVE SURG SS PI 7.0 STRL IVOR (GLOVE) ×4 IMPLANT
GOWN STRL REUS W/ TWL LRG LVL3 (GOWN DISPOSABLE) ×4 IMPLANT
GOWN STRL REUS W/ TWL XL LVL3 (GOWN DISPOSABLE) ×2 IMPLANT
GOWN STRL REUS W/TWL LRG LVL3 (GOWN DISPOSABLE) ×4
GOWN STRL REUS W/TWL XL LVL3 (GOWN DISPOSABLE) ×2
KIT BASIN OR (CUSTOM PROCEDURE TRAY) ×4 IMPLANT
KIT ROOM TURNOVER OR (KITS) ×4 IMPLANT
MESH ULTRAPRO 3X6 7.6X15CM (Mesh General) ×4 IMPLANT
NEEDLE HYPO 25GX1X1/2 BEV (NEEDLE) ×4 IMPLANT
NS IRRIG 1000ML POUR BTL (IV SOLUTION) ×4 IMPLANT
PACK SURGICAL SETUP 50X90 (CUSTOM PROCEDURE TRAY) ×4 IMPLANT
PAD ARMBOARD 7.5X6 YLW CONV (MISCELLANEOUS) ×4 IMPLANT
PENCIL BUTTON HOLSTER BLD 10FT (ELECTRODE) ×4 IMPLANT
SPONGE LAP 18X18 X RAY DECT (DISPOSABLE) ×4 IMPLANT
SUT MNCRL AB 4-0 PS2 18 (SUTURE) ×4 IMPLANT
SUT PROLENE 2 0 CT2 30 (SUTURE) ×12 IMPLANT
SUT SILK 2 0 (SUTURE) ×2
SUT SILK 2 0 SH (SUTURE) IMPLANT
SUT SILK 2-0 18XBRD TIE 12 (SUTURE) ×2 IMPLANT
SUT VIC AB 2-0 CT1 27 (SUTURE) ×6
SUT VIC AB 2-0 CT1 TAPERPNT 27 (SUTURE) ×6 IMPLANT
SUT VIC AB 3-0 54X BRD REEL (SUTURE) ×2 IMPLANT
SUT VIC AB 3-0 BRD 54 (SUTURE) ×2
SUT VIC AB 3-0 SH 27 (SUTURE) ×2
SUT VIC AB 3-0 SH 27XBRD (SUTURE) ×2 IMPLANT
SYR BULB 3OZ (MISCELLANEOUS) ×4 IMPLANT
SYR CONTROL 10ML LL (SYRINGE) ×4 IMPLANT
TOWEL OR 17X24 6PK STRL BLUE (TOWEL DISPOSABLE) ×4 IMPLANT
TOWEL OR 17X26 10 PK STRL BLUE (TOWEL DISPOSABLE) ×4 IMPLANT
TUBE CONNECTING 12'X1/4 (SUCTIONS) ×1
TUBE CONNECTING 12X1/4 (SUCTIONS) ×3 IMPLANT
YANKAUER SUCT BULB TIP NO VENT (SUCTIONS) ×4 IMPLANT

## 2014-03-23 NOTE — Transfer of Care (Signed)
Immediate Anesthesia Transfer of Care Note  Patient: William Mahoney  Procedure(s) Performed: Procedure(s): HERNIA REPAIR INGUINAL ADULT OPEN REPAIR RIGHT INGUINAL HERNIA REPAIR (Right) INSERTION OF MESH (Right)  Patient Location: PACU  Anesthesia Type:General  Level of Consciousness: awake  Airway & Oxygen Therapy: Patient Spontanous Breathing and Patient connected to nasal cannula oxygen  Post-op Assessment: Report given to PACU RN and Post -op Vital signs reviewed and stable  Post vital signs: stable  Complications: No apparent anesthesia complications

## 2014-03-23 NOTE — Interval H&P Note (Signed)
History and Physical Interval Note:  03/23/2014 8:15 AM  William Mahoney  has presented today for surgery, with the diagnosis of right ingunial hernia  The various methods of treatment have been discussed with the patient and family. After consideration of risks, benefits and other options for treatment, the patient has consented to  Procedure(s): HERNIA REPAIR INGUINAL ADULT OPEN REPAIR RIGHT INGUINAL HERNIA REPAIR (Right) INSERTION OF MESH (Right) as a surgical intervention .  The patient's history has been reviewed, patient examined today, no change in status, stable for surgery.  I have reviewed the patient's chart and labs.  Questions were answered to the patient's satisfaction.     Adin Hector

## 2014-03-23 NOTE — Anesthesia Preprocedure Evaluation (Addendum)
Anesthesia Evaluation  Patient identified by MRN, date of birth, ID band Patient awake    Reviewed: Allergy & Precautions, H&P , NPO status , Patient's Chart, lab work & pertinent test results  Airway Mallampati: II  TM Distance: >3 FB Neck ROM: Full    Dental  (+) Upper Dentures, Lower Dentures   Pulmonary COPDformer smoker,          Cardiovascular hypertension, Pt. on medications and Pt. on home beta blockers + dysrhythmias Atrial Fibrillation     Neuro/Psych negative neurological ROS  negative psych ROS   GI/Hepatic Neg liver ROS, GERD-  ,  Endo/Other  negative endocrine ROS  Renal/GU Renal disease     Musculoskeletal  (+) Arthritis -,   Abdominal   Peds  Hematology negative hematology ROS (+)   Anesthesia Other Findings   Reproductive/Obstetrics                            Anesthesia Physical Anesthesia Plan  ASA: III  Anesthesia Plan: General   Post-op Pain Management:    Induction: Intravenous  Airway Management Planned: Oral ETT and LMA  Additional Equipment:   Intra-op Plan:   Post-operative Plan: Extubation in OR  Informed Consent: I have reviewed the patients History and Physical, chart, labs and discussed the procedure including the risks, benefits and alternatives for the proposed anesthesia with the patient or authorized representative who has indicated his/her understanding and acceptance.   Dental advisory given  Plan Discussed with: CRNA and Surgeon  Anesthesia Plan Comments:        Anesthesia Quick Evaluation

## 2014-03-23 NOTE — Progress Notes (Signed)
Left hand peripheral IV removed,  catheter intact, site WNL

## 2014-03-23 NOTE — Op Note (Signed)
Patient Name:           William Mahoney   Date of Surgery:        03/23/2014  Pre op Diagnosis:      Right inguinal hernia  Post op Diagnosis:    Sliding, indirect right inguinal hernia  Procedure:                 Open repair right inguinal hernia with mesh   William Mahoney repair)  Surgeon:                     William Mahoney, M.D., FACS  Assistant:                      OR staff  Operative Indications:   The patient is a 80 year old male who presents with an inguinal hernia. This 80 year old gentleman is referred by Dr. Garret Mahoney for evaluation of a right inguinal hernia. The patient has not had any hernia surgery in the past. He has noted that he had a bulge in the right groin for 3 or 4 years. It has been bothering him a little bit more lately but is not severe. He has to push it back and sometimes at it's a little uncomfortable. Never incarcerated. On examination he has a rather large but reducible right inguinal hernia. Past history is significant for laparoscopic assisted low anterior resection by me in 2009. He had T3 N0 cancer.  last colonoscopy by Dr. Oretha Mahoney in 2014, reportedly clear. No evidence of recurrence. Paroxysmal atrial fibrillation on aspirin. Hypertension. Gout. COPD and former smoker. Status post cholecystectomy in [place]. Generally health has been stable and he is active.   Operative Findings:       He had an indirect right inguinal hernia. The cecum made-up part of the wall of this. After completely dissecting the hernia sac away from the cord structures we carefully closed the sac and reduced it and oversewed the muscle layers on top of this.  Procedure in Detail:          Following the induction of general anesthesia the patient's lower abdomen and genitalia were prepped and draped in a sterile fashion. Surgical timeout was performed. Intravenous antibiotics were given. 5% Marcaine with epinephrine was used as a local infiltration anesthetic.     A  transverse incision was made in the right groin, overlying the inguinal canal. Dissection was carried down to the aponeurosis of the external oblique. The external oblique was incised in the direction of fibers, opening of the external ring. The external oblique was dissected away from the underlying tissues and self-retaining retractors were placed. The cord structures were mobilized and encircled with a Penrose drain. Cremasteric muscle fibers were skeletonized. The sliding indirect sac was dissected away from the cord structures back to the level of the internal ring. The indirect sac was inspected. I passed my finger through it into the peritoneal cavity and felt no abnormalities. Sac was then carefully closed with a pursestring suture of 2-0 Vicryl. The sac was easily reduced. The internal ring was tightened laterally with 2 figure-of-eight sutures of 2-0 Vicryl.    The floor of the inguinal canal was repaired and reinforced with an onlay graft of UltraPro mesh.   A 3" x 6" piece of ultra Pro mesh was brought to the operative field and trimmed at the corners to accommodate  the anatomy of the wound. The mesh was sutured in place  with running sutures of 2-0 Prolene and interrupted mattress sutures of 2-0 Prolene. The mesh was sutured so as to generously overlap the fascia at the pubic tubercle, then along the inguinal ligament inferiorly.      Medially, superiorly, and superior laterally several interrupted mattress sutures of 2-0 Prolene were placed. The mesh was incised laterally so as to wraparound the cord structures at the internal ring. The tails of the mesh were overlapped laterally and further sutures were placed laterally. This provided very secure repair and coverage both medial and lateral to the internal ring but allowed adequate fingertip opening for the cord structures. The wound was irrigated with saline. Hemostasis was excellent. The external oblique was closed with a running suture of 2-0  Vicryl. Scarpa's fascia was closed with 3-0 Vicryl sutures and the skin closed with a running subcuticular suture of 4-0 Monocryl and Dermabond. The patient tolerated the procedure well and was taken to PACU in stable condition. EBL 10 mL. Counts correct. Complications none.     William Mahoney, M.D., FACS General and Minimally Invasive Surgery Breast and Colorectal Surgery  03/23/2014 10:01 AM

## 2014-03-23 NOTE — Anesthesia Postprocedure Evaluation (Signed)
  Anesthesia Post-op Note  Patient: William Mahoney  Procedure(s) Performed: Procedure(s): HERNIA REPAIR INGUINAL ADULT OPEN REPAIR RIGHT INGUINAL HERNIA REPAIR (Right) INSERTION OF MESH (Right)  Patient Location: PACU  Anesthesia Type:General  Level of Consciousness: awake, alert  and oriented  Airway and Oxygen Therapy: Patient Spontanous Breathing  Post-op Pain: none  Post-op Assessment: Post-op Vital signs reviewed  Post-op Vital Signs: Reviewed  Last Vitals: There were no vitals filed for this visit.  Complications: No apparent anesthesia complications

## 2014-03-23 NOTE — Discharge Instructions (Signed)
[facility] _______[facility] ° °UMBILICAL OR INGUINAL HERNIA REPAIR: POST OP INSTRUCTIONS ° °Always review your discharge instruction sheet given to you by the facility where your surgery was performed. °IF YOU HAVE DISABILITY OR FAMILY LEAVE FORMS, YOU MUST BRING THEM TO THE OFFICE FOR PROCESSING.   °DO NOT GIVE THEM TO YOUR DOCTOR. ° °1. A  prescription for pain medication may be given to you upon discharge.  Take your pain medication as prescribed, if needed.  If narcotic pain medicine is not needed, then you may take acetaminophen (Tylenol) or ibuprofen (Advil) as needed. °2. Take your usually prescribed medications unless otherwise directed. °3. If you need a refill on your pain medication, please contact your pharmacy.  They will contact our office to request authorization. Prescriptions will not be filled after 5 pm or on week-ends. °4. You should follow a light diet the first 24 hours after arrival home, such as soup and crackers, etc.  Be sure to include lots of fluids daily.  Resume your normal diet the day after surgery. °5. Most patients will experience some swelling and bruising around the umbilicus or in the groin and scrotum.  Ice packs and reclining will help.  Swelling and bruising can take several days to resolve.  °6. It is common to experience some constipation if taking pain medication after surgery.  Increasing fluid intake and taking a stool softener (such as Colace) will usually help or prevent this problem from occurring.  A mild laxative (Milk of Magnesia or Miralax) should be taken according to package directions if there are no bowel movements after 48 hours. °7. Unless discharge instructions indicate otherwise, you may remove your bandages 24-48 hours after surgery, and you may shower at that time.  You may have steri-strips (small skin tapes) in place directly over the incision.  These strips should be left on the skin for 7-10 days.  If your surgeon used skin glue on the  incision, you may shower in 24 hours.  The glue will flake off over the next 2-3 weeks.  Any sutures or staples will be removed at the office during your follow-up visit. °8. ACTIVITIES:  You may resume regular (light) daily activities beginning the next day--such as daily self-care, walking, climbing stairs--gradually increasing activities as tolerated.  You may have sexual intercourse when it is comfortable.  Refrain from any heavy lifting or straining until approved by your doctor. °a. You may drive when you are no longer taking prescription pain medication, you can comfortably wear a seatbelt, and you can safely maneuver your car and apply brakes. °b. RETURN TO WORK:  __________________________________________________________ °9. You should see your doctor in the office for a follow-up appointment approximately 2-3 weeks after your surgery.  Make sure that you call for this appointment within a day or two after you arrive home to insure a convenient appointment time. °10. OTHER INSTRUCTIONS:  __________________________________________________________________________________________________________________________________________________________________________________________  °WHEN TO CALL YOUR DOCTOR: °1. Fever over 101.0 °2. Inability to urinate °3. Nausea and/or vomiting °4. Extreme swelling or bruising °5. Continued bleeding from incision. °6. Increased pain, redness, or drainage from the incision ° °The clinic staff is available to answer your questions during regular business hours.  Please don’t hesitate to call and ask to speak to one of the nurses for clinical concerns.  If you have a medical emergency, go to the nearest emergency room or call 911.  A surgeon from [facility] is always on call at the hospital ° ° °  [address], [address], [place], [place]  [address] ? ° [address] [address], [place], [place]   [address] °[phone] ? [phone] ? FAX [phone] °Web site:  [email or web address] ° °What to eat: ° °For your first meals, you should eat lightly; only small meals initially.  If you do not have nausea, you may eat larger meals.  Avoid spicy, greasy and heavy food.   ° °General Anesthesia, Adult, Care After  °Refer to this sheet in the next few weeks. These instructions provide you with information on caring for yourself after your procedure. Your health care provider may also give you more specific instructions. Your treatment has been planned according to current medical practices, but problems sometimes occur. Call your health care provider if you have any problems or questions after your procedure.  °WHAT TO EXPECT AFTER THE PROCEDURE  °After the procedure, it is typical to experience:  °Sleepiness.  °Nausea and vomiting. °HOME CARE INSTRUCTIONS  °For the first 24 hours after general anesthesia:  °Have a responsible person with you.  °Do not drive a car. If you are alone, do not take public transportation.  °Do not drink alcohol.  °Do not take medicine that has not been prescribed by your health care provider.  °Do not sign important papers or make important decisions.  °You may resume a normal diet and activities as directed by your health care provider.  °Change bandages (dressings) as directed.  °If you have questions or problems that seem related to general anesthesia, call the hospital and ask for the anesthetist or anesthesiologist on call. °SEEK MEDICAL CARE IF:  °You have nausea and vomiting that continue the day after anesthesia.  °You develop a rash. °SEEK IMMEDIATE MEDICAL CARE IF:  °You have difficulty breathing.  °You have chest pain.  °You have any allergic problems. °Document Released: 07/13/2000 Document Revised: 12/07/2012 Document Reviewed: 10/20/2012  °ExitCare® Patient Information ©2014 [employer].  ° ° °

## 2014-03-27 ENCOUNTER — Encounter (HOSPITAL_COMMUNITY): Payer: Self-pay | Admitting: General Surgery

## 2014-04-23 ENCOUNTER — Ambulatory Visit: Payer: Medicare Other | Admitting: Pharmacist Clinician (PhC)/ Clinical Pharmacy Specialist

## 2014-04-30 ENCOUNTER — Ambulatory Visit (INDEPENDENT_AMBULATORY_CARE_PROVIDER_SITE_OTHER): Payer: 59 | Admitting: Pharmacist Clinician (PhC)/ Clinical Pharmacy Specialist

## 2014-04-30 DIAGNOSIS — I482 Chronic atrial fibrillation, unspecified: Secondary | ICD-10-CM

## 2014-04-30 DIAGNOSIS — Z7901 Long term (current) use of anticoagulants: Secondary | ICD-10-CM | POA: Insufficient documentation

## 2014-04-30 MED ORDER — WARFARIN SODIUM 2.5 MG PO TABS: 2.5000 mg | ORAL_TABLET | Freq: Every day | ORAL | Status: DC

## 2014-04-30 NOTE — Progress Notes (Signed)
Spoke with patient about options for anticoagulation.  Pt wife of Xarelto, but cost is $150/month, he has same insurance.  Reviewed +/- of NOACs vs warfarin.  Pt chooses to start warfarin, understanding the need for regular INR checks.  Rx sent to [facility]. appt set for 1 week

## 2014-05-07 ENCOUNTER — Ambulatory Visit (INDEPENDENT_AMBULATORY_CARE_PROVIDER_SITE_OTHER): Payer: 59 | Admitting: Pharmacist Clinician (PhC)/ Clinical Pharmacy Specialist

## 2014-05-07 DIAGNOSIS — I482 Chronic atrial fibrillation, unspecified: Secondary | ICD-10-CM

## 2014-05-07 DIAGNOSIS — Z7901 Long term (current) use of anticoagulants: Secondary | ICD-10-CM

## 2014-05-07 LAB — POCT INR: INR: 1.6

## 2014-05-14 ENCOUNTER — Ambulatory Visit: Payer: 59 | Admitting: Pharmacist Clinician (PhC)/ Clinical Pharmacy Specialist

## 2014-05-15 ENCOUNTER — Ambulatory Visit (INDEPENDENT_AMBULATORY_CARE_PROVIDER_SITE_OTHER): Payer: 59 | Admitting: Pharmacist Clinician (PhC)/ Clinical Pharmacy Specialist

## 2014-05-15 DIAGNOSIS — I482 Chronic atrial fibrillation, unspecified: Secondary | ICD-10-CM

## 2014-05-15 DIAGNOSIS — Z7901 Long term (current) use of anticoagulants: Secondary | ICD-10-CM

## 2014-05-15 LAB — POCT INR: INR: 2.5

## 2014-05-21 ENCOUNTER — Ambulatory Visit (INDEPENDENT_AMBULATORY_CARE_PROVIDER_SITE_OTHER): Payer: 59 | Admitting: Pharmacist Clinician (PhC)/ Clinical Pharmacy Specialist

## 2014-05-21 DIAGNOSIS — I482 Chronic atrial fibrillation, unspecified: Secondary | ICD-10-CM

## 2014-05-21 DIAGNOSIS — Z7901 Long term (current) use of anticoagulants: Secondary | ICD-10-CM

## 2014-05-21 LAB — POCT INR: INR: 2.9

## 2014-06-04 ENCOUNTER — Ambulatory Visit: Payer: 59 | Admitting: Pharmacist Clinician (PhC)/ Clinical Pharmacy Specialist

## 2014-06-06 ENCOUNTER — Ambulatory Visit (INDEPENDENT_AMBULATORY_CARE_PROVIDER_SITE_OTHER): Payer: Medicare Other | Admitting: Pharmacist Clinician (PhC)/ Clinical Pharmacy Specialist

## 2014-06-06 DIAGNOSIS — I482 Chronic atrial fibrillation, unspecified: Secondary | ICD-10-CM

## 2014-06-06 DIAGNOSIS — Z7901 Long term (current) use of anticoagulants: Secondary | ICD-10-CM

## 2014-06-06 LAB — POCT INR: INR: 2.1

## 2014-06-08 ENCOUNTER — Ambulatory Visit (INDEPENDENT_AMBULATORY_CARE_PROVIDER_SITE_OTHER): Payer: Medicare Other | Admitting: Family Medicine

## 2014-06-08 ENCOUNTER — Encounter: Payer: Self-pay | Admitting: Family Medicine

## 2014-06-08 VITALS — BP 120/70 | HR 60 | Temp 97.8°F | Wt 160.0 lb

## 2014-06-08 DIAGNOSIS — M545 Low back pain, unspecified: Secondary | ICD-10-CM

## 2014-06-08 MED ORDER — HYDROCODONE-ACETAMINOPHEN 5-325 MG PO TABS: 1.0000 | ORAL_TABLET | Freq: Four times a day (QID) | ORAL | Status: DC | PRN

## 2014-06-08 NOTE — Progress Notes (Signed)
Pre visit review using our clinic review tool, if applicable. No additional management support is needed unless otherwise documented below in the visit note. 

## 2014-06-08 NOTE — Patient Instructions (Signed)

## 2014-06-08 NOTE — Progress Notes (Signed)
Subjective:    Patient ID: William Mahoney, male    DOB: Dec 17, 1925, 81 y.o.   MRN: [id number]  HPI Patient is seen as acute work in with initial complaint of right hip pain. On further questioning is actually having more right lower lumbar pain. Onset a few days ago. No injury. He describes a sharp pain which is worse with movement as well as with walking. Minimal radiation toward but not below this region. He's not had any urine or stool incontinence. He has some chronic right foot numbness which is not new. His pain is 8 out of 10 at its worst. No alleviating factors. Question of some mild weakness but is not sure about this.  No history of any appetite or weight changes. No fevers or chills. No dysuria. He had recent angle hernia surgery back in December but is not having any groin pain. He is on Coumadin with history of chronic atrial fibrillation.  Past Medical History  Diagnosis Date  . History of alcohol abuse     quit drinking in the mid 80's  . BPH (benign prostatic hypertrophy)   . History of pancreatitis   . Peptic ulcer disease   . Cholecystitis   . Urine incontinence   . Hyperlipemia   . Osteoarthritis (arthritis due to wear and tear of joints)   . History of hemorrhoids   . PANCREATITIS, HX OF 12/07/2006    Qualifier: Diagnosis of  By: Jimmye Norman, LPN, [facility]   . History of shingles 07/27/2010  . Hypertension   . Dysrhythmia     A-fib  . Actinic keratosis   . Peripheral vascular disease     hands cold, goes from red to blue  . Pneumonia     as a child  . GERD (gastroesophageal reflux disease)     very rare  . Cancer 2009/2010    colon/rectal cancer  - surgery, chemo and radiation  . Alcoholic cirrhosis     40 years ago  . Macular degeneration     not sure which eye   Past Surgical History  Procedure Laterality Date  . Cholecystectomy    . Tonsillectomy    . Eye surgery Bilateral     cataracts removed  . Colon surgery      colectomy for colon CA  . Cardiac  catheterization      15 years ago in [place]  . Inguinal hernia repair Right 03/23/2014    Procedure: HERNIA REPAIR INGUINAL ADULT OPEN REPAIR RIGHT INGUINAL HERNIA REPAIR;  Surgeon: Fanny Skates, MD;  Location: [facility];  Service: General;  Laterality: Right;  . Insertion of mesh Right 03/23/2014    Procedure: INSERTION OF MESH;  Surgeon: Fanny Skates, MD;  Location: [facility];  Service: General;  Laterality: Right;    reports that he quit smoking about 6 years ago. He has never used smokeless tobacco. He reports that he does not drink alcohol or use illicit drugs. family history includes Hypertension in his mother; Lymphoma in his father and another family member; Stroke in an other family member. There is no history of Colon cancer, Esophageal cancer, Rectal cancer, or Stomach cancer. Allergies  Allergen Reactions  . Flexeril [Cyclobenzaprine Hcl]     The 10 mg dose   caused altered mental status      Review of Systems  Constitutional: Negative for fever, activity change and appetite change.  Respiratory: Negative for cough and shortness of breath.   Cardiovascular: Negative for chest pain and leg  swelling.  Gastrointestinal: Negative for vomiting and abdominal pain.  Endocrine: Negative for polydipsia and polyuria.  Genitourinary: Negative for dysuria, hematuria and flank pain.  Musculoskeletal: Positive for back pain. Negative for joint swelling.  Neurological: Negative for numbness.       Objective:   Physical Exam  Constitutional: He appears well-developed and well-nourished.  Cardiovascular: Normal rate and regular rhythm.   Musculoskeletal: He exhibits no edema.  No spinal tenderness. Straight leg raise are negative. His feet are slightly cold touch but fairly good capillary refill. Pulses are somewhat difficult to palpate He has somewhat restricted range of motion right hip but this does not reproduce his back pain  Neurological:  Trace ankle and 1+ knee reflexes  bilaterally. He has no weakness with strength testing whatsoever in his lower extremities.          Assessment & Plan:  Right lumbar back pain. Suspect musculoskeletal. He may have some nerve impingement. Nonfocal neurologic exam. Suspect this is coming from his back and not his hip. Cautious short-term use of hydrocodone for severe pain only. He cannot take nonsteroidals because of Coumadin and will also try to avoid prednisone with his chronic Coumadin therapy. Watch for any neurologic signs such as weakness. Touch base 2 weeks if not resolving. He will try heat or ice for symptom relief

## 2014-06-27 ENCOUNTER — Ambulatory Visit (INDEPENDENT_AMBULATORY_CARE_PROVIDER_SITE_OTHER): Payer: Medicare Other | Admitting: Pharmacist Clinician (PhC)/ Clinical Pharmacy Specialist

## 2014-06-27 DIAGNOSIS — I482 Chronic atrial fibrillation, unspecified: Secondary | ICD-10-CM

## 2014-06-27 DIAGNOSIS — Z7901 Long term (current) use of anticoagulants: Secondary | ICD-10-CM

## 2014-06-27 LAB — POCT INR: INR: 2.3

## 2014-07-10 ENCOUNTER — Ambulatory Visit (INDEPENDENT_AMBULATORY_CARE_PROVIDER_SITE_OTHER): Payer: Medicare Other | Admitting: Family Medicine

## 2014-07-10 VITALS — BP 122/80 | Temp 97.5°F | Wt 164.0 lb

## 2014-07-10 DIAGNOSIS — I480 Paroxysmal atrial fibrillation: Secondary | ICD-10-CM | POA: Diagnosis not present

## 2014-07-10 DIAGNOSIS — J3089 Other allergic rhinitis: Secondary | ICD-10-CM | POA: Diagnosis not present

## 2014-07-10 DIAGNOSIS — Z23 Encounter for immunization: Secondary | ICD-10-CM

## 2014-07-10 DIAGNOSIS — I10 Essential (primary) hypertension: Secondary | ICD-10-CM

## 2014-07-10 NOTE — Assessment & Plan Note (Signed)
Controlled on triamterine-hctz 37.5-25mg  and metoprolol 50mg 

## 2014-07-10 NOTE — Assessment & Plan Note (Signed)
Rate controlled on metoprolol but sounds to be in NSR today. Anticoagulated with coumadin-asked him to schedule visit with coumadin clinic in 2 weeks as he would like to switch his checks to this location. CBC next visit with known mild thrombocytopenia.

## 2014-07-10 NOTE — Assessment & Plan Note (Signed)
Trial flonase or rhinocort, if no improvement add neti pot, finally can add antihistamine such as claritin as next step (avoid if possible due to anticholinergics but also can use if needed)

## 2014-07-10 NOTE — Progress Notes (Signed)
[name], MD Phone: [phone]  Subjective:   William Mahoney is a 81 y.o. year old very pleasant male patient who presents with the following:  Atrial Fibrillation- anticoagulated, rate controlled -chadsvasc score of 2. Had recommended anticoagulatoin but patient delined. He later went to cardiology a few months later and agreed to coumadin. He has been getting INR there. He requests changing anticoagulation visits to our office.  ROS- no palpitations, chest pain, shortness of breath, edema  Hypertension-controlled  BP Readings from Last 3 Encounters:  07/10/14 122/80  06/08/14 120/70  03/23/14 135/89   Home BP monitoring-no Compliant with medications-yes without side effects ROS-Denies any CP, HA, SOB, blurry vision, LE edema.   Allergic rhinitis-poor control Has tried allegra in past, off now. Has tried nasal sprays but not clear how long. Complaining of watery itchy eyes and clear runny nose since season change ROS- denies sore throat, fevers  Past Medical History- Patient Active Problem List   Diagnosis Date Noted  . Atrial fibrillation 12/07/2008    Priority: High  . Gout 01/09/2014    Priority: Medium  . 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 Former smoker 06/23/2007    Priority: Medium  . HYPERLIPIDEMIA 02/09/2007    Priority: Medium  . Essential hypertension 12/07/2006    Priority: Medium  . COPD 12/07/2006    Priority: Medium  . BENIGN PROSTATIC HYPERTROPHY 12/07/2006    Priority: Medium  . Allergic rhinitis 08/15/2013    Priority: Low  . Feeling poorly 08/15/2013    Priority: Low  . 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 Inguinal hernia 03/21/2010    Priority: Low  . BASAL CELL CARCINOMA SKIN LOWER LIMB INCL HIP 03/21/2010    Priority: Low  . Actinic keratosis 12/07/2008    Priority: Low  . OSTEOARTHRITIS 02/09/2007    Priority: Low  . PEPTIC ULCER DISEASE 12/07/2006    Priority: Low   . URINARY INCONTINENCE 12/07/2006    Priority: Low  . PANCREATITIS, HX OF 12/07/2006    Priority: Low  . Long-term (current) use of anticoagulants 04/30/2014   Medications- reviewed and updated Current Outpatient Prescriptions  Medication Sig Dispense Refill  . allopurinol (ZYLOPRIM) 300 MG tablet Take 150 mg by mouth daily.    . colchicine 0.6 MG tablet Take 0.6 mg by mouth 2 (two) times daily as needed (gout).    . fish oil-omega-3 fatty acids 1000 MG capsule Take 1 g by mouth daily.    . metoprolol (LOPRESSOR) 50 MG tablet Take 0.5 tablets (25 mg total) by mouth 2 (two) times daily. 30 tablet 11  . Multiple Vitamin (MULTIVITAMIN) tablet Take 1 tablet by mouth daily.    [name] triamterene-hydrochlorothiazide (MAXZIDE-25) 37.5-25 MG per tablet TAKE ONE-HALF TABLET BY MOUTH ONCE DAILY (Patient taking differently: Take 0.5 tablets by mouth daily. ) 30 tablet 5  . warfarin (COUMADIN) 2.5 MG tablet Take 1 tablet (2.5 mg total) by mouth daily. 30 tablet 3   No current facility-administered medications for this visit.    Objective: BP 122/80 mmHg  Temp(Src) 97.5 F (36.4 C)  Wt 164 lb (74.39 kg) Gen: NAD, resting comfortably, appears younger than stated age CV: RRR no murmurs rubs or gallops (not in a fib today) Lungs: CTAB no crackles, wheeze, rhonchi Abdomen: soft/nontender/nondistended/normal bowel sounds. No rebound or guarding.  Ext: no edema Skin: warm, dry, some scaling along eye browns and in scalp   Assessment/Plan:  Atrial fibrillation  Rate controlled on metoprolol but sounds to be in NSR today. Anticoagulated with coumadin-asked him to schedule visit with coumadin clinic in 2 weeks as he would like to switch his checks to this location. CBC next visit with known mild thrombocytopenia.    Essential hypertension Controlled on triamterine-hctz 37.5-25mg  and metoprolol 50mg    Allergic rhinitis Trial flonase or rhinocort, if no improvement add neti pot, finally can add  antihistamine such as claritin as next step (avoid if possible due to anticholinergics but also can use if needed)   6 months f/u. Also had some seborrheic dermatitis noted- will trial selsun blue  Orders Placed This Encounter  Procedures  . Pneumococcal conjugate vaccine 13-valent

## 2014-07-10 NOTE — Patient Instructions (Addendum)
Received final Pneumonia shot today ([id number])  Schedule coumadin clinic at our front desk in about 2 weeks. I am ok with [place] taking over your coumadin checks. Your last level looked great.   BP looks great  You were not in atrial fibrillation today. Seems that you are doing well overall.   For allergies (watery eyes, runny nose), use flonase or rhinocort for 1 month at least. If this does not work, then you can add neti pot use once a day. If that does not work after 2-3 weeks, can try claritin (safe with coumadin).

## 2014-07-25 ENCOUNTER — Ambulatory Visit: Payer: Medicare Other | Admitting: Pharmacist Clinician (PhC)/ Clinical Pharmacy Specialist

## 2014-07-30 ENCOUNTER — Ambulatory Visit (INDEPENDENT_AMBULATORY_CARE_PROVIDER_SITE_OTHER): Payer: Medicare Other | Admitting: General Practice

## 2014-07-30 DIAGNOSIS — Z7901 Long term (current) use of anticoagulants: Secondary | ICD-10-CM

## 2014-07-30 DIAGNOSIS — I482 Chronic atrial fibrillation, unspecified: Secondary | ICD-10-CM

## 2014-07-30 LAB — POCT INR: INR: 2

## 2014-07-30 NOTE — Progress Notes (Signed)
Pre visit review using our clinic review tool, if applicable. No additional management support is needed unless otherwise documented below in the visit note. 

## 2014-08-24 ENCOUNTER — Other Ambulatory Visit: Payer: Self-pay | Admitting: Cardiovascular Disease

## 2014-08-27 ENCOUNTER — Ambulatory Visit (INDEPENDENT_AMBULATORY_CARE_PROVIDER_SITE_OTHER): Payer: Medicare Other | Admitting: General Practice

## 2014-08-27 DIAGNOSIS — I4891 Unspecified atrial fibrillation: Secondary | ICD-10-CM | POA: Diagnosis not present

## 2014-08-27 DIAGNOSIS — Z5181 Encounter for therapeutic drug level monitoring: Secondary | ICD-10-CM

## 2014-08-27 DIAGNOSIS — Z7901 Long term (current) use of anticoagulants: Secondary | ICD-10-CM

## 2014-08-27 LAB — POCT INR: INR: 2.1

## 2014-08-27 NOTE — Progress Notes (Signed)
Pre visit review using our clinic review tool, if applicable. No additional management support is needed unless otherwise documented below in the visit note. 

## 2014-09-27 ENCOUNTER — Other Ambulatory Visit: Payer: Self-pay | Admitting: General Practice

## 2014-09-27 ENCOUNTER — Ambulatory Visit (INDEPENDENT_AMBULATORY_CARE_PROVIDER_SITE_OTHER): Payer: Medicare Other | Admitting: General Practice

## 2014-09-27 DIAGNOSIS — I4891 Unspecified atrial fibrillation: Secondary | ICD-10-CM

## 2014-09-27 DIAGNOSIS — Z5181 Encounter for therapeutic drug level monitoring: Secondary | ICD-10-CM

## 2014-09-27 LAB — POCT INR: INR: 2.5

## 2014-09-27 MED ORDER — WARFARIN SODIUM 2.5 MG PO TABS: ORAL_TABLET | ORAL | Status: DC

## 2014-09-27 NOTE — Progress Notes (Signed)
Pre visit review using our clinic review tool, if applicable. No additional management support is needed unless otherwise documented below in the visit note. 

## 2014-10-02 ENCOUNTER — Encounter: Payer: Self-pay | Admitting: Family Medicine

## 2014-10-02 ENCOUNTER — Ambulatory Visit (INDEPENDENT_AMBULATORY_CARE_PROVIDER_SITE_OTHER): Payer: Medicare Other | Admitting: Family Medicine

## 2014-10-02 VITALS — BP 122/80 | HR 60 | Temp 98.1°F | Wt 164.0 lb

## 2014-10-02 DIAGNOSIS — D696 Thrombocytopenia, unspecified: Secondary | ICD-10-CM

## 2014-10-02 DIAGNOSIS — R238 Other skin changes: Secondary | ICD-10-CM | POA: Diagnosis not present

## 2014-10-02 DIAGNOSIS — R233 Spontaneous ecchymoses: Secondary | ICD-10-CM

## 2014-10-02 DIAGNOSIS — Z5181 Encounter for therapeutic drug level monitoring: Secondary | ICD-10-CM

## 2014-10-02 DIAGNOSIS — J3089 Other allergic rhinitis: Secondary | ICD-10-CM | POA: Diagnosis not present

## 2014-10-02 LAB — PROTIME-INR
INR: 3.1 ratio — ABNORMAL HIGH (ref 0.8–1.0)
PROTHROMBIN TIME: 32.9 s — AB (ref 9.6–13.1)

## 2014-10-02 LAB — CBC
HEMATOCRIT: 46.3 % (ref 39.0–52.0)
Hemoglobin: 15.3 g/dL (ref 13.0–17.0)
MCHC: 33 g/dL (ref 30.0–36.0)
MCV: 92.7 fl (ref 78.0–100.0)
Platelets: 150 10*3/uL (ref 150.0–400.0)
RBC: 5 Mil/uL (ref 4.22–5.81)
RDW: 15.1 % (ref 11.5–15.5)
WBC: 6.5 10*3/uL (ref 4.0–10.5)

## 2014-10-02 NOTE — Progress Notes (Signed)
[name], MD  Subjective:  William Mahoney is a 81 y.o. year old very pleasant male patient who presents with:  Easy Bruising on coumadin. Recent extensive bruising with some bleeding, right elbow Thrombocytopenia known mild in low 100s- stable last check - Patient brushed his arm up against something on Sunday and shortly therafter had a very large bruise on right elbow. Bled some but brusing most impressive. Per wife, seemed to fill up with blood. INR 09/27/14 at 2.5 on coumadin. Not on aspirin. History mild thrombocytopenia as well.  ROS-  No hemoptysis, hematemesis, hematuria, hematochezia.   Allergic Rhinitis A lot of runny nose, watery itchy eyes especially when doesn't rain for a while. Did flonase 1-2 weeks R0s- no sinus pressure, fever, chills  Past Medical History- atrial fibrillation as reason for coumadin, HLD, gout, HTN, BPH, COPD  Medications- reviewed and updated Current Outpatient Prescriptions  Medication Sig Dispense Refill  . allopurinol (ZYLOPRIM) 300 MG tablet Take 150 mg by mouth daily.    . fish oil-omega-3 fatty acids 1000 MG capsule Take 1 g by mouth daily.    . metoprolol (LOPRESSOR) 50 MG tablet Take 0.5 tablets (25 mg total) by mouth 2 (two) times daily. 30 tablet 11  . Multiple Vitamin (MULTIVITAMIN) tablet Take 1 tablet by mouth daily.    [name] triamterene-hydrochlorothiazide (MAXZIDE-25) 37.5-25 MG per tablet TAKE ONE-HALF TABLET BY MOUTH ONCE DAILY (Patient taking differently: Take 0.5 tablets by mouth daily. ) 30 tablet 5  . warfarin (COUMADIN) 2.5 MG tablet Take as directed by coumadin clinic 35 tablet 3  . colchicine 0.6 MG tablet Take 0.6 mg by mouth 2 (two) times daily as needed (gout).     No current facility-administered medications for this visit.    Objective: BP 122/80 mmHg  Pulse 60  Temp(Src) 98.1 F (36.7 C)  Wt 164 lb (74.39 kg) Gen: NAD, resting comfortably Clear rhinorrhea noted and rubs nose constantly, mild watering of eyes CV:  RRR no murmurs rubs or gallops Lungs: CTAB no crackles, wheeze, rhonchi Ext: no edema, extensive ecchymosis over right elbow at least 15 x 10 cm. No active bleeding Skin: warm, dry, no rash except for ecchymosis noted above, some other areas of much smaller bruising Neuro: grossly normal, moves all extremities   Assessment/Plan:  Easy Bruising on coumadin. Recent extensive bruising with some bleeding, right elbow Thrombocytopenia known mild in low 100s- stable last check INR at 3.1. Hold 1 dose of coumadin then resume normal dosing and INR clinic follow up. Thrombocytopenia resolved on labs today. He did have an extensive bruise likely due to coumadin. Advised protecting area. High risk for CVA and do not think we should stop coumadin with just this 1 event.   Allergic rhinitis Patient did flonase for just a week or two. Advised month trial and can add allegra or claritin if needed. Discussed preferred to avoid antihistamines at his age if possible though.   prn follow up  Results for orders placed or performed in visit on 10/02/14 (from the past 24 hour(s))  CBC     Status: None   Collection Time: 10/02/14 12:20 PM  Result Value Ref Range   WBC 6.5 4.0 - 10.5 K/uL   RBC 5.00 4.22 - 5.81 Mil/uL   Platelets 150.0 150.0 - 400.0 K/uL   Hemoglobin 15.3 13.0 - 17.0 g/dL   HCT 46.3 39.0 - 52.0 %   MCV 92.7 78.0 - 100.0 fl   MCHC 33.0 30.0 - 36.0 g/dL  RDW 15.1 11.5 - 15.5 %  Protime-INR     Status: Abnormal   Collection Time: 10/02/14 12:20 PM  Result Value Ref Range   INR 3.1 (H) 0.8 - 1.0 ratio   Prothrombin Time 32.9 (H) 9.6 - 13.1 sec

## 2014-10-02 NOTE — Assessment & Plan Note (Signed)
Patient did flonase for just a week or two. Advised month trial and can add allegra or claritin if needed. Discussed preferred to avoid antihistamines at his age if possible though.

## 2014-10-02 NOTE — Patient Instructions (Addendum)
Check some basic bloodwork. Continue coumadin unless we call you and tell you otherwise.   Use flonase consistently for at least a month to get full benefit. If not improved by week 3, can add OTC claritin or allegra.

## 2014-10-19 ENCOUNTER — Encounter: Payer: Self-pay | Admitting: Family Medicine

## 2014-10-19 ENCOUNTER — Ambulatory Visit (INDEPENDENT_AMBULATORY_CARE_PROVIDER_SITE_OTHER): Payer: Medicare Other | Admitting: Family Medicine

## 2014-10-19 ENCOUNTER — Ambulatory Visit (HOSPITAL_COMMUNITY)
Admission: RE | Admit: 2014-10-19 | Discharge: 2014-10-19 | Disposition: A | Discharge status: Home / Self Care | Payer: Medicare Other | Source: Ambulatory Visit | Attending: Family Medicine | Admitting: Family Medicine

## 2014-10-19 VITALS — BP 120/90 | HR 86 | Temp 98.8°F | Wt 157.0 lb

## 2014-10-19 DIAGNOSIS — R1030 Lower abdominal pain, unspecified: Secondary | ICD-10-CM | POA: Diagnosis not present

## 2014-10-19 DIAGNOSIS — N508 Other specified disorders of male genital organs: Secondary | ICD-10-CM | POA: Diagnosis not present

## 2014-10-19 DIAGNOSIS — N50812 Left testicular pain: Secondary | ICD-10-CM

## 2014-10-19 DIAGNOSIS — G4700 Insomnia, unspecified: Secondary | ICD-10-CM | POA: Diagnosis not present

## 2014-10-19 LAB — CBC WITH DIFFERENTIAL/PLATELET
BASOS ABS: 0 10*3/uL (ref 0.0–0.1)
Basophils Relative: 0.1 % (ref 0.0–3.0)
Eosinophils Absolute: 0 10*3/uL (ref 0.0–0.7)
Eosinophils Relative: 0 % (ref 0.0–5.0)
HEMATOCRIT: 47.5 % (ref 39.0–52.0)
Hemoglobin: 15.8 g/dL (ref 13.0–17.0)
Lymphocytes Relative: 2.8 % — ABNORMAL LOW (ref 12.0–46.0)
Lymphs Abs: 0.6 10*3/uL — ABNORMAL LOW (ref 0.7–4.0)
MCHC: 33.3 g/dL (ref 30.0–36.0)
MCV: 92.5 fl (ref 78.0–100.0)
Monocytes Absolute: 1.5 10*3/uL — ABNORMAL HIGH (ref 0.1–1.0)
Monocytes Relative: 7 % (ref 3.0–12.0)
NEUTROS ABS: 18.9 10*3/uL — AB (ref 1.4–7.7)
NEUTROS PCT: 90.1 % — AB (ref 43.0–77.0)
PLATELETS: 193 10*3/uL (ref 150.0–400.0)
RBC: 5.13 Mil/uL (ref 4.22–5.81)
RDW: 14.4 % (ref 11.5–15.5)

## 2014-10-19 LAB — COMPREHENSIVE METABOLIC PANEL
ALK PHOS: 80 U/L (ref 39–117)
ALT: 22 U/L (ref 0–53)
AST: 31 U/L (ref 0–37)
Albumin: 3.8 g/dL (ref 3.5–5.2)
BUN: 23 mg/dL (ref 6–23)
CHLORIDE: 95 meq/L — AB (ref 96–112)
CO2: 29 meq/L (ref 19–32)
Calcium: 9.4 mg/dL (ref 8.4–10.5)
Creatinine, Ser: 1.39 mg/dL (ref 0.40–1.50)
GFR: 51.37 mL/min — ABNORMAL LOW (ref >60.00)
Glucose, Bld: 148 mg/dL — ABNORMAL HIGH (ref 70–99)
Potassium: 3.6 mEq/L (ref 3.5–5.1)
Sodium: 133 mEq/L — ABNORMAL LOW (ref 135–145)
TOTAL PROTEIN: 7.4 g/dL (ref 6.0–8.3)
Total Bilirubin: 1.6 mg/dL — ABNORMAL HIGH (ref 0.2–1.2)

## 2014-10-19 LAB — POCT URINALYSIS DIPSTICK
Glucose, UA: NEGATIVE
NITRITE UA: NEGATIVE
SPEC GRAV UA: 1.02
Urobilinogen, UA: 4
pH, UA: 5.5

## 2014-10-19 MED ORDER — TRAZODONE HCL 50 MG PO TABS: 25.0000 mg | ORAL_TABLET | Freq: Every evening | ORAL | Status: DC | PRN

## 2014-10-19 MED ORDER — LEVOFLOXACIN 500 MG PO TABS: 500.0000 mg | ORAL_TABLET | Freq: Every day | ORAL | Status: DC

## 2014-10-19 NOTE — Patient Instructions (Signed)
Labs before you go including urine. I am checking a CEA just in case given lower abdominal pain.   Get ultrasound today  Likely to treat with antibiotics-call after ultrasound  If fevers, worsening pain, ED care can be saught

## 2014-10-19 NOTE — Progress Notes (Addendum)
William Reddish, MD  Subjective:  William Mahoney is a 81 y.o. year old very pleasant male patient who presents with:  Lower abdominal pain moving down into testicles, particularly on the left -Started on Monday. Aching 7/10 pain in lower abdomen. Has had trouble walking due to the pain. Subjective fevers and definitely with significant chills. Pain slowly worsening. Has taken some aspirin PM with minor relief. Pain doesn't wake from sleep. Worse with walking or bending over, better with rest.   No dysuria. Does endorse polyuria and some worsened incontinence from baseline. Testicles became tender yesterday up to 5/10 aching pain. Worse with touching area up to 7/10.   ROS- no penile discharge, not sexually active otuside of marriage, no rectal pain, see HPI  Past Medical History- atrial fibrillation on metoprolol and coumadin, BPH, COPD, HTN, gout  Medications- reviewed and updated Current Outpatient Prescriptions  Medication Sig Dispense Refill  . allopurinol (ZYLOPRIM) 300 MG tablet Take 150 mg by mouth daily.    . fish oil-omega-3 fatty acids 1000 MG capsule Take 1 g by mouth daily.    . metoprolol (LOPRESSOR) 50 MG tablet Take 0.5 tablets (25 mg total) by mouth 2 (two) times daily. 30 tablet 11  . Multiple Vitamin (MULTIVITAMIN) tablet Take 1 tablet by mouth daily.    [name] triamterene-hydrochlorothiazide (MAXZIDE-25) 37.5-25 MG per tablet TAKE ONE-HALF TABLET BY MOUTH ONCE DAILY (Patient taking differently: Take 0.5 tablets by mouth daily. ) 30 tablet 5  . warfarin (COUMADIN) 2.5 MG tablet Take as directed by coumadin clinic 35 tablet 3  . colchicine 0.6 MG tablet Take 0.6 mg by mouth 2 (two) times daily as needed (gout).     Objective: BP 120/90 mmHg  Pulse 86  Temp(Src) 98.8 F (37.1 C)  Wt 157 lb (71.215 kg) Gen: NAD, resting comfortably [id number] irregular no murmurs rubs or gallops Lungs: CTAB no crackles, wheeze, rhonchi Abdomen: soft/mild Suprapubic  tenderness/nondistended/normal bowel sounds. No rebound or guarding.  Slight separation in abdominal muscles in midline below umbilicus but no pain and no content protrudes, diastasis rectus noted GU exam: normal right testicle, left testicle nearly double in size and diffusely tender Rectal: normal tone, diffusely enlarged prostate without tenderness, no masses or tenderness Ext: no edema Skin: warm, dry, no rash, left scrotal skin is slightly erythematous but itself is not tender to touch or warm   Assessment/Plan:  Lower abdominal pain moving down into testicles, particularly on the left Abdominal exam largely normal. Does have enlarged Right testicle and diffusely tender. Send for ultrasound of scrotum today. Highest on my differential is epididymitis. Doubt prostatitis given nontender exam. Urine with blood, and leukocytes-send stat gram stain and also for culture. Likely to treat with levaquin 500mg  equivalent for 10 days.  My other main concern is kidney stones given blood in urine and groin/testicle pain. May end up needing CT- with testicular exam being most impressive finding- focused on further eval that component  Get labs as well including CEA with history colorectal cancer  Insomnia Trial trazodone 25-50mg    Return precautions advised.   Orders Placed This Encounter  Procedures  . Gram stain  . Urine culture    [facility]  . US Scrotum    Standing Status: Future     Number of Occurrences:      Standing Expiration Date: 12/20/2015    Order Specific Question:  Reason for Exam (SYMPTOM  OR DIAGNOSIS REQUIRED)    Answer:  Left testicular pain, suspect epididymitis but entire testicle sensitive  to touch. double size right testicle    Order Specific Question:  Preferred imaging location?    Answer:  [place]  . CBC with Differential  . Comprehensive metabolic panel    [place]  . CEA  . POCT urinalysis dipstick    Meds ordered this encounter  Medications  .  traZODone (DESYREL) 50 MG tablet    Sig: Take 0.5-1 tablets (25-50 mg total) by mouth at bedtime as needed for sleep.    Dispense:  30 tablet    Refill:  3    Addendum Results for orders placed or performed in visit on 10/19/14 (from the past 24 hour(s))  CBC with Differential     Status: Abnormal   Collection Time: 10/19/14 11:40 AM  Result Value Ref Range   WBC 20.9 Repeated and verified X2. (HH) 4.0 - 10.5 K/uL   RBC 5.13 4.22 - 5.81 Mil/uL   Hemoglobin 15.8 13.0 - 17.0 g/dL   HCT 47.5 39.0 - 52.0 %   MCV 92.5 78.0 - 100.0 fl   MCHC 33.3 30.0 - 36.0 g/dL   RDW 14.4 11.5 - 15.5 %   Platelets 193.0 150.0 - 400.0 K/uL   Neutrophils Relative % 90.1 (H) 43.0 - 77.0 %   Lymphocytes Relative 2.8 (L) 12.0 - 46.0 %   Monocytes Relative 7.0 3.0 - 12.0 %   Eosinophils Relative 0.0 0.0 - 5.0 %   Basophils Relative 0.1 0.0 - 3.0 %   Neutro Abs 18.9 (H) 1.4 - 7.7 K/uL   Lymphs Abs 0.6 (L) 0.7 - 4.0 K/uL   Monocytes Absolute 1.5 (H) 0.1 - 1.0 K/uL   Eosinophils Absolute 0.0 0.0 - 0.7 K/uL   Basophils Absolute 0.0 0.0 - 0.1 K/uL   Narrative   Critical result called to nana on 10/19/2014 1:20 PM by Delorise Jackson. Results were read back to caller. Results faxed to site/floor on 10/19/2014 1:21 PM by Delorise Jackson.  Comprehensive metabolic panel     Status: Abnormal   Collection Time: 10/19/14 11:40 AM  Result Value Ref Range   Sodium 133 (L) 135 - 145 mEq/L   Potassium 3.6 3.5 - 5.1 mEq/L   Chloride 95 (L) 96 - 112 mEq/L   CO2 29 19 - 32 mEq/L   Glucose, Bld 148 (H) 70 - 99 mg/dL   BUN 23 6 - 23 mg/dL   Creatinine, Ser 1.39 0.40 - 1.50 mg/dL   Total Bilirubin 1.6 (H) 0.2 - 1.2 mg/dL   Alkaline Phosphatase 80 39 - 117 U/L   AST 31 0 - 37 U/L   ALT 22 0 - 53 U/L   Total Protein 7.4 6.0 - 8.3 g/dL   Albumin 3.8 3.5 - 5.2 g/dL   Calcium 9.4 8.4 - 10.5 mg/dL   GFR 51.37 (L) >60.00 mL/min  POCT urinalysis dipstick     Status: Abnormal   Collection Time: 10/19/14 12:04 PM  Result Value Ref  Range   Color, UA brown    Clarity, UA clear    Glucose, UA n    Bilirubin, UA 2+    Ketones, UA trace    Spec Grav, UA 1.020    Blood, UA 2+    pH, UA 5.5    Protein, UA 2+    Urobilinogen, UA 4.0    Nitrite, UA n    Leukocytes, UA Trace (A) Negative   US Scrotum  10/19/2014   CLINICAL DATA:  Left scrotal region pain for 2 days.  EXAM:  ULTRASOUND OF SCROTUM  TECHNIQUE: Complete ultrasound examination of the testicles, epididymis, and other scrotal structures was performed.  COMPARISON:  None.  FINDINGS: Right testicle  Measurements: 3.9 cm x 1.8 cm x 3.1 cm. No testicular mass. Several small testicular calcifications.  Left testicle  Measurements: 4.1 cm x 2.8 cm x 3.6 cm. No testicular mass. Several small testicular calcifications. Mildly heterogeneous echogenicity. Mild increased blood flow when compared to the right testicle.  Right epididymis:  Normal in size and appearance.  Left epididymis: Septated cystic area enlarges the epididymal head. This measures 18 mm x 10 mm x 21 mm.  Hydrocele:  Moderate left and minimal right hydroceles.  Varicocele:  None visualized.  IMPRESSION: 1. No testicular mass. Mild testicular microlithiasis. Current literature suggests that testicular microlithiasis is not a significant independent risk factor for development of testicular carcinoma, and that follow up imaging is not warranted in the absence of other risk factors. Monthly testicular self-examination and annual physical exams are considered appropriate surveillance. If patient has other risk factors for testicular carcinoma, then referral to Urology should be considered. (Reference: DeCastro, et al.: A 5-Year Follow up Study of Asymptomatic Men with Testicular Microlithiasis. J Urol 2008; 876:8115-7262.) 2. Mildly heterogeneous echogenicity of the left testicle. Mild increased left testicular blood flow. Complex septated cystic area enlarges the left epididymal head. Findings support epididymitis orchitis on  the left. 3. Moderate left and minimal right hydroceles. 4. No other abnormalities.   Electronically Signed   By: Lajean Manes M.D.   On: 10/19/2014 16:38    Urine concerning for infection with blood, leukocytes. WBC 20k. U/s reveals epididymitis as likely cause as suspected. Will give levaquin 500mg  x 10 days. Tried to reach patient as would also like to give ceftriaxone in office but did not pick up in 3 attempts so left voicemail.   Forward to [place] as starting levaquin and may need INR check next week

## 2014-10-19 NOTE — Addendum Note (Signed)
Addended by: Marin Olp on: 10/19/2014 04:55 PM   Modules accepted: Orders

## 2014-10-19 NOTE — Assessment & Plan Note (Signed)
Trial trazodone 25-50mg 

## 2014-10-20 ENCOUNTER — Encounter: Payer: Self-pay | Admitting: Family Medicine

## 2014-10-20 ENCOUNTER — Ambulatory Visit (INDEPENDENT_AMBULATORY_CARE_PROVIDER_SITE_OTHER): Payer: Medicare Other | Admitting: Family Medicine

## 2014-10-20 VITALS — BP 124/80 | HR 104 | Temp 97.0°F | Ht 71.0 in | Wt 155.5 lb

## 2014-10-20 DIAGNOSIS — N451 Epididymitis: Secondary | ICD-10-CM

## 2014-10-20 LAB — GRAM STAIN
GRAM STAIN: NONE SEEN
Gram Stain: NONE SEEN

## 2014-10-20 LAB — CEA: CEA: 2.6 ng/mL (ref 0.0–5.0)

## 2014-10-20 MED ORDER — CEFTRIAXONE SODIUM 500 MG IJ SOLR
250.0000 mg | Freq: Once | INTRAMUSCULAR | Status: AC
  Administered 2014-10-20: 250 mg via INTRAMUSCULAR

## 2014-10-20 MED ORDER — HYDROCODONE-ACETAMINOPHEN 5-325 MG PO TABS: 0.5000 | ORAL_TABLET | Freq: Four times a day (QID) | ORAL | Status: DC | PRN

## 2014-10-20 MED ORDER — TRAMADOL HCL 50 MG PO TABS: 25.0000 mg | ORAL_TABLET | Freq: Three times a day (TID) | ORAL | Status: DC | PRN

## 2014-10-20 NOTE — Progress Notes (Signed)
PCP: [name], MD  Subjective:  William Mahoney is a 81 y.o. year old very pleasant male patient who presents for follow up epididymitis Seen yesterday. Started on levaquin 500mg - took first dose last night. Testicular ultrasound confirmed diagnosis. Lower abdominal pain better. Testicular pain very slightly better but chills have improved. L testicle has been very sore and kept him up last night. Did not sleep at all. WBC came back yesterday 20k. Has both GNR and GPC in clusters   ROS-no fever, chills, nausea, vomiting since last visit.   Pertinent Past Medical History- atrial fibrillation, BPH, COPD, HTN, GOut, HLD, history of rectal cancer- CEA came back not elevated.   Medications- reviewed  Current Outpatient Prescriptions  Medication Sig Dispense Refill  . allopurinol (ZYLOPRIM) 300 MG tablet Take 150 mg by mouth daily.    . colchicine 0.6 MG tablet Take 0.6 mg by mouth 2 (two) times daily as needed (gout).    . fish oil-omega-3 fatty acids 1000 MG capsule Take 1 g by mouth daily.    [name] levofloxacin (LEVAQUIN) 500 MG tablet Take 1 tablet (500 mg total) by mouth daily. 10 tablet 0  . metoprolol (LOPRESSOR) 50 MG tablet Take 0.5 tablets (25 mg total) by mouth 2 (two) times daily. 30 tablet 11  . Multiple Vitamin (MULTIVITAMIN) tablet Take 1 tablet by mouth daily.    . traZODone (DESYREL) 50 MG tablet Take 0.5-1 tablets (25-50 mg total) by mouth at bedtime as needed for sleep. 30 tablet 3  . triamterene-hydrochlorothiazide (MAXZIDE-25) 37.5-25 MG per tablet TAKE ONE-HALF TABLET BY MOUTH ONCE DAILY (Patient taking differently: Take 0.5 tablets by mouth daily. ) 30 tablet 5  . warfarin (COUMADIN) 2.5 MG tablet Take as directed by coumadin clinic 35 tablet 3   Objective: BP 124/80 mmHg  Pulse 104  Temp(Src) 97 F (36.1 C) (Oral)  Ht 5\' 11"  (1.803 m)  Wt 155 lb 8 oz (70.534 kg)  BMI 21.70 kg/m2  SpO2 94% Gen: NAD, resting comfortably [id number] irregular (no longer tachy on  exam) no murmurs rubs or gallops Lungs: CTAB no crackles, wheeze, rhonchi Abdomen: soft/no Suprapubic tenderness anymore/nondistended/normal bowel sounds. No rebound or guarding.  Slight separation in abdominal muscles in midline below umbilicus but no pain and no content protrudes, diastasis rectus noted GU exam: normal right testicle, left testicle slightly larger than yesterday, tenderness now focused in epidiymis only  Ext: no edema Skin: warm, dry, no rash, left scrotal skin is warm and erythematous    Assessment/Plan:  Epididymitis Severe with WBC 20k. Tenderness is similar but epididymis has increased in size. Gave dose of ceftriaxone 250mg  IM today. Continue levaquin. Close follow up on Tuesday scheduled. Believe we will know at that point whether will need urology consultation or potential IV antibiotics. Strict/emergent return precautions advised.   Hopeful if GPC found are enterococcus-susceptible to levaquin. Suspect GNR E. Coli and covered by both treatments. Doubt STD as not active at age 43. Start with tramadol for pain control but provided norco as needed as well.   Meds ordered this encounter  Medications  . traMADol (ULTRAM) 50 MG tablet    Sig: Take 0.5-1 tablets (25-50 mg total) by mouth every 8 (eight) hours as needed.    Dispense:  30 tablet    Refill:  0  . HYDROcodone-acetaminophen (NORCO/VICODIN) 5-325 MG per tablet    Sig: Take 0.5-1 tablets by mouth every 6 (six) hours as needed for moderate pain.    Dispense:  15 tablet  Refill:  0  . cefTRIAXone (ROCEPHIN) injection 250 mg    Sig:

## 2014-10-20 NOTE — Progress Notes (Signed)
Pre visit review using our clinic review tool, if applicable. No additional management support is needed unless otherwise documented below in the visit note. 

## 2014-10-20 NOTE — Patient Instructions (Signed)
Take levaquin daily for full 10 days Boost you with injection of antibiotics  If you have fever, chills, worsening symptoms, seek care in the ER  Let's follow up Tuesday to make sure improving. May have to see urology if not.

## 2014-10-21 ENCOUNTER — Other Ambulatory Visit: Payer: Self-pay | Admitting: Family Medicine

## 2014-10-21 MED ORDER — AMOXICILLIN-POT CLAVULANATE 875-125 MG PO TABS: 1.0000 | ORAL_TABLET | Freq: Two times a day (BID) | ORAL | Status: DC

## 2014-10-22 LAB — URINE CULTURE: Colony Count: >=100000

## 2014-10-23 ENCOUNTER — Encounter: Payer: Self-pay | Admitting: Family Medicine

## 2014-10-23 ENCOUNTER — Ambulatory Visit (INDEPENDENT_AMBULATORY_CARE_PROVIDER_SITE_OTHER): Payer: Medicare Other | Admitting: Family Medicine

## 2014-10-23 VITALS — BP 120/88 | HR 89 | Temp 98.2°F | Wt 155.0 lb

## 2014-10-23 DIAGNOSIS — Z7901 Long term (current) use of anticoagulants: Secondary | ICD-10-CM | POA: Diagnosis not present

## 2014-10-23 DIAGNOSIS — N451 Epididymitis: Secondary | ICD-10-CM

## 2014-10-23 LAB — POCT INR: INR: 3

## 2014-10-23 MED ORDER — LEVOFLOXACIN 500 MG PO TABS: 500.0000 mg | ORAL_TABLET | Freq: Every day | ORAL | Status: DC

## 2014-10-23 MED ORDER — HYDROCODONE-ACETAMINOPHEN 5-325 MG PO TABS: 0.5000 | ORAL_TABLET | Freq: Four times a day (QID) | ORAL | Status: DC | PRN

## 2014-10-23 MED ORDER — AMOXICILLIN-POT CLAVULANATE 875-125 MG PO TABS: 1.0000 | ORAL_TABLET | Freq: Two times a day (BID) | ORAL | Status: DC

## 2014-10-23 NOTE — Progress Notes (Signed)
PCP: [name], MD  Subjective:  William Mahoney is a 81 y.o. year old very pleasant male patient who presents for follow up epididymitis  Seen last Friday and Saturday. Testicular ultrasound confirmed diagnosis. Started on levaquin 500mg  7/1.  Given CTX 250mg  IM on 7/2. Lower abdominal pain better. No longer feeling chilled and Testicular pain mildly improved. Has been far more confortable with vicodin. On gram stain- Has both GNR and GPC in clusters. Culture showing enterococcus. Overall he feels much better but still with discomfort up to 6/10 in testicle if pressed on.   ROS-no fever, chills, nausea, vomiting  Pertinent Past Medical History- atrial fibrillation, BPH, COPD, HTN, GOut, HLD, history of rectal cancer- CEA came back not elevated.   Medications- reviewed  Current Outpatient Prescriptions  Medication Sig Dispense Refill  . allopurinol (ZYLOPRIM) 300 MG tablet Take 150 mg by mouth daily.    [name] amoxicillin-clavulanate (AUGMENTIN) 875-125 MG per tablet Take 1 tablet by mouth 2 (two) times daily. 22 tablet 0  . fish oil-omega-3 fatty acids 1000 MG capsule Take 1 g by mouth daily.    [name] levofloxacin (LEVAQUIN) 500 MG tablet Take 1 tablet (500 mg total) by mouth daily. 11 tablet 0  . metoprolol (LOPRESSOR) 50 MG tablet Take 0.5 tablets (25 mg total) by mouth 2 (two) times daily. 30 tablet 11  . Multiple Vitamin (MULTIVITAMIN) tablet Take 1 tablet by mouth daily.    [name] triamterene-hydrochlorothiazide (MAXZIDE-25) 37.5-25 MG per tablet TAKE ONE-HALF TABLET BY MOUTH ONCE DAILY (Patient taking differently: Take 0.5 tablets by mouth daily. ) 30 tablet 5  . warfarin (COUMADIN) 2.5 MG tablet Take as directed by coumadin clinic 35 tablet 3  . colchicine 0.6 MG tablet Take 0.6 mg by mouth 2 (two) times daily as needed (gout).    [name] HYDROcodone-acetaminophen (NORCO/VICODIN) 5-325 MG per tablet Take 0.5-1 tablets by mouth every 6 (six) hours as needed for moderate pain. 25 tablet 0  . traMADol  (ULTRAM) 50 MG tablet Take 0.5-1 tablets (25-50 mg total) by mouth every 8 (eight) hours as needed. (Patient not taking: Reported on 10/23/2014) 30 tablet 0  . traZODone (DESYREL) 50 MG tablet Take 0.5-1 tablets (25-50 mg total) by mouth at bedtime as needed for sleep. (Patient not taking: Reported on 10/23/2014) 30 tablet 3   Objective: BP 120/88 mmHg  Pulse 89  Temp(Src) 98.2 F (36.8 C)  Wt 155 lb (70.308 kg) Gen: NAD, resting comfortably IF:[id number] irregular  no murmurs rubs or gallops Lungs: CTAB no crackles, wheeze, rhonchi Abdomen: soft/no tenderness/nondistended/normal bowel sounds. No rebound or guarding.  Slight separation in abdominal muscles in midline below umbilicus but no pain and no content protrudes, diastasis rectus noted GU exam: normal right testicle, left testicle 2.5-3x as large as right, swollen diffusely. Tenderness diminished compared to Saturday exam.   Ext: no edema Skin: warm, dry, no rash, left scrotal skin is mildly erythematous and slightly warm    Assessment/Plan:  Epididymitis On levaquin day 4, augmentin day 2. Had 1 CTX dose with enlarged testicle but improved pain and improved as far as no feverish or chilled feelings anymore. Discussed case with Dr. Louis Meckel of [facility] as I was concerned about need for hospitalization for IV steroids or at least outpatient urology visit. He advised me that increased swelling is common  In first few days of antibiotics before improvement and that it is reassuring that systemically patient feeling better. He advised elevation, icing, 1 week follow up as long as no  systemic symptoms. Repeat ultrasound if not improving and may need drain into epididymis. Consider antibiotics prolonged such as 2-4 weeks and I opted for 3 weeks. Continue to await cultures.   Follow up next Monday, schedule ultrasound for after visit in case we have to get this ( I will be out of office after Monday so ordered today).   Dr. Louis Meckel also  mentioned could refer to urology or get patient on BPH medicationas as that was likely origin- retained urine leading to epididymitis  Meds ordered this encounter  Medications  . amoxicillin-clavulanate (AUGMENTIN) 875-125 MG per tablet    Sig: Take 1 tablet by mouth 2 (two) times daily.    Dispense:  22 tablet    Refill:  0  . levofloxacin (LEVAQUIN) 500 MG tablet    Sig: Take 1 tablet (500 mg total) by mouth daily.    Dispense:  11 tablet    Refill:  0  . HYDROcodone-acetaminophen (NORCO/VICODIN) 5-325 MG per tablet    Sig: Take 0.5-1 tablets by mouth every 6 (six) hours as needed for moderate pain.    Dispense:  25 tablet    Refill:  0   >50% of 25 minute office visit was spent on counseling (reasons for return, expectations about illness) and coordination of care (discussing case with Dr. Louis Meckel)

## 2014-10-23 NOTE — Patient Instructions (Addendum)
Continue levaquin and augmentin unless we let you know otherwise. We will take them for at least 3 weeks.   Elevate scrotum with towel as much as possible while resting. Ice area for 5-10 minutes or as tolerated several times a day  Seek care immediately if having fever, chills, worsening symptoms  Get INR checked today  See me next Monday at 11:30 AM- front desk use same day  Ordred ultrasound repeat just in case we have to do it

## 2014-10-25 ENCOUNTER — Telehealth: Payer: Self-pay | Admitting: Family Medicine

## 2014-10-25 NOTE — Telephone Encounter (Signed)
We will call pt once Dr. Yong Channel has reviewed the results. It will more than likely me tomorrow.

## 2014-10-25 NOTE — Telephone Encounter (Signed)
Daughter would like to know if urine culture is back and if not, when do you expect?

## 2014-10-26 ENCOUNTER — Ambulatory Visit (INDEPENDENT_AMBULATORY_CARE_PROVIDER_SITE_OTHER): Payer: Medicare Other | Admitting: Family Medicine

## 2014-10-26 ENCOUNTER — Telehealth: Payer: Self-pay | Admitting: Family Medicine

## 2014-10-26 ENCOUNTER — Encounter: Payer: Self-pay | Admitting: Family Medicine

## 2014-10-26 VITALS — BP 102/70 | HR 218 | Temp 98.4°F | Wt 155.0 lb

## 2014-10-26 DIAGNOSIS — G4700 Insomnia, unspecified: Secondary | ICD-10-CM | POA: Diagnosis not present

## 2014-10-26 DIAGNOSIS — N451 Epididymitis: Secondary | ICD-10-CM

## 2014-10-26 MED ORDER — ZOLPIDEM TARTRATE 5 MG PO TABS: 2.5000 mg | ORAL_TABLET | Freq: Every evening | ORAL | Status: DC | PRN

## 2014-10-26 NOTE — Telephone Encounter (Signed)
Patient Name: MAVRICK MCQUIGG DOB: [birth year] Initial Comment Caller states her dad has swollen testicles. Nurse Assessment Nurse: Ronnald Ramp, RN, Miranda Date/Time (Eastern Time): 10/26/2014 8:28:07 AM Confirm and document reason for call. If symptomatic, describe symptoms. ---Caller states her father has been diagnosed with possible Epididymitis. He has been on 1 antibiotics since Friday and another was added on Sunday. His scrotum has been very swollen and not getting better. Has the patient traveled out of the country within the last 30 days? ---Not Applicable Does the patient require triage? ---Yes Related visit to physician within the last 2 weeks? ---Yes Does the PT have any chronic conditions? (i.e. diabetes, asthma, etc.) ---Yes List chronic conditions. ---BPH, Back pain, Gout, HTN Guidelines Guideline Title Affirmed Question Affirmed Notes Recent Medical Visit for Illness Follow-up Call [1] Caller has URGENT question (includes prescribed medication questions) AND [2] triager unable to answer Final Disposition User Call PCP Now Ronnald Ramp, RN, Miranda Comments Appt scheduled with Dr. Yong Channel today at 11:45a

## 2014-10-26 NOTE — Patient Instructions (Signed)
Continue levaquin and augmentin through Monday at least. May do them until I get back in town week after next.   Continue to: Elevate scrotum with towel as much as possible while resting. Ice area for 5-10 minutes or as tolerated several times a day  Seek care immediately if having fever, chills, worsening symptoms

## 2014-10-26 NOTE — Progress Notes (Signed)
PCP: [name], MD  Subjective:  William Mahoney is a 81 y.o. year old very pleasant male patient who presents for follow up epididymitis  Seen 7/1, 7/2, and 7/7. Testicular ultrasound confirmed diagnosis originally. Started on levaquin 500mg  7/1.  Given CTX 250mg  IM on 7/2.  With treatment within a few days, Lower abdominal pain  resolved as well as chills. Testicular pain has continued but decreased from 7/10 at peak down to 4/10 at this point.  On gram stain- Has both GNR and GPC in clusters. Culture showing enterococcus only sensitive to ampicillin and levaquin.   Since weekend was coming up, patient wanted to check in before weekend. Swelling has very slightly improved with icing, testicular elevation.    ROS-no fever, chills, nausea, vomiting  Pertinent Past Medical History- atrial fibrillation, BPH, COPD, HTN, GOut, HLD, history of rectal cancer- CEA came back not elevated.   Medications- reviewed  Current Outpatient Prescriptions  Medication Sig Dispense Refill  . allopurinol (ZYLOPRIM) 300 MG tablet Take 150 mg by mouth daily.    [name] amoxicillin-clavulanate (AUGMENTIN) 875-125 MG per tablet Take 1 tablet by mouth 2 (two) times daily. 22 tablet 0  . fish oil-omega-3 fatty acids 1000 MG capsule Take 1 g by mouth daily.    [name] levofloxacin (LEVAQUIN) 500 MG tablet Take 1 tablet (500 mg total) by mouth daily. 11 tablet 0  . metoprolol (LOPRESSOR) 50 MG tablet Take 0.5 tablets (25 mg total) by mouth 2 (two) times daily. 30 tablet 11  . Multiple Vitamin (MULTIVITAMIN) tablet Take 1 tablet by mouth daily.    [name] triamterene-hydrochlorothiazide (MAXZIDE-25) 37.5-25 MG per tablet TAKE ONE-HALF TABLET BY MOUTH ONCE DAILY (Patient taking differently: Take 0.5 tablets by mouth daily. ) 30 tablet 5  . warfarin (COUMADIN) 2.5 MG tablet Take as directed by coumadin clinic 35 tablet 3  . colchicine 0.6 MG tablet Take 0.6 mg by mouth 2 (two) times daily as needed (gout).    [name]  HYDROcodone-acetaminophen (NORCO/VICODIN) 5-325 MG per tablet Take 0.5-1 tablets by mouth every 6 (six) hours as needed for moderate pain. 25 tablet 0  . traMADol (ULTRAM) 50 MG tablet Take 0.5-1 tablets (25-50 mg total) by mouth every 8 (eight) hours as needed. (Patient not taking: Reported on 10/23/2014) 30 tablet 0  . traZODone (DESYREL) 50 MG tablet Take 0.5-1 tablets (25-50 mg total) by mouth at bedtime as needed for sleep. (Patient not taking: Reported on 10/23/2014) 30 tablet 3   Objective: BP 102/70 mmHg  Pulse 218  Temp(Src) 98.4 F (36.9 C)  Wt 155 lb (70.308 kg) Gen: NAD, resting comfortably [id number] irregular  no murmurs rubs or gallops Lungs: CTAB no crackles, wheeze, rhonchi GU exam: normal right testicle, left testicle 2.5-3x as large as right, swollen diffusely and perhaps slightly smaller than previous. Tenderness much diminished compared to  Exam earlier this week Ext: no edema Skin: warm, dry, no rash, left scrotal skin is mildly erythematous and slightly warm    Assessment/Plan:  Epididymitis On levaquin day 7, augmentin day 5. Had 1 CTX dose Enterococcus susceptible to both ampicillin and levaquin.  Patient improving in regards to pain. Testicular size similar to slightly improved.   Previously "Discussed case with Dr. Louis Meckel of [facility] as I was concerned about need for hospitalization for IV steroids or at least outpatient urology visit. He advised me that increased swelling is common  In first few days of antibiotics before improvement and that it is reassuring that systemically patient feeling better.  He advised elevation, icing, 1 week follow up as long as no systemic symptoms. Repeat ultrasound if not improving and may need drain into epididymis"  I see patient back on Monday and if swelling has not significantly improved, will have ultrasound Tuesday morning (had planned Monday afternoon but none available). I discussed with patient I would be on the  road Tuesday and likely not able to review report until Wednesday. Concern still would be for abscess. Continue dual antibiotics until more significant improvement noted.   Continue to consider BPH meds in long run as possible cause of retained urine/infection  Insomnia. S: Patient having sleep issues not due to pain.  A/P: We will trial ambien instead of trazodone which did not work. Possible the vicodin is affecting sleep at this point but use is diminishing. None in 24 hours at this point.  Meds ordered this encounter  Medications  . zolpidem (AMBIEN) 5 MG tablet    Sig: Take 0.5 tablets (2.5 mg total) by mouth at bedtime as needed for sleep.    Dispense:  15 tablet    Refill:  -

## 2014-10-29 ENCOUNTER — Encounter: Payer: Self-pay | Admitting: Family Medicine

## 2014-10-29 ENCOUNTER — Ambulatory Visit (INDEPENDENT_AMBULATORY_CARE_PROVIDER_SITE_OTHER): Payer: Medicare Other | Admitting: Family Medicine

## 2014-10-29 ENCOUNTER — Ambulatory Visit: Payer: Medicare Other

## 2014-10-29 VITALS — BP 118/90 | HR 80 | Temp 97.8°F | Wt 155.0 lb

## 2014-10-29 DIAGNOSIS — N451 Epididymitis: Secondary | ICD-10-CM

## 2014-10-29 MED ORDER — WARFARIN SODIUM 2.5 MG PO TABS: ORAL_TABLET | ORAL | Status: DC

## 2014-10-29 NOTE — Patient Instructions (Signed)
Refilled coumadin  I honestly think the area is slightly worse  I want you to be seen in the next few days (hopefully by wednesday) and we are processing that referral  Sit tight until we have that appointment  Continue antibiotic

## 2014-10-29 NOTE — Progress Notes (Signed)
PCP: [name], MD  Subjective:  William Mahoney is a 81 y.o. year old very pleasant male patient who presents for follow up epididymitis  Seen 7/1, 7/2, and 7/7, 7/11. Testicular ultrasound confirmed diagnosis originally. Started on levaquin 500mg  7/1.  Given CTX 250mg  IM on 7/2.  With treatment within a few days, Lower abdominal pain  resolved as well as chills. Testicular pain has continued but decreased from 7/10 at peak down to 3/10 at this point (continues to slowly improve).  On gram stain- Has both GNR and GPC in clusters. Culture showing enterococcus  sensitive to both ampicillin and levaquin.   On 7/11 swelling had reduced with frequent icing and elevation. Since the weekend patient has continued elevation but stopped the icing for 2-3 days. He thinks size has not continued to improve. He is no longer having to take vicodin as he originally did.   ROS-no fever, chills, nausea, vomiting  Pertinent Past Medical History- atrial fibrillation, BPH, COPD, HTN, GOut, HLD, history of rectal cancer- CEA came back not elevated.   Medications- reviewed  Current Outpatient Prescriptions  Medication Sig Dispense Refill  . allopurinol (ZYLOPRIM) 300 MG tablet Take 150 mg by mouth daily.    [name] amoxicillin-clavulanate (AUGMENTIN) 875-125 MG per tablet Take 1 tablet by mouth 2 (two) times daily. 22 tablet 0  . fish oil-omega-3 fatty acids 1000 MG capsule Take 1 g by mouth daily.    [name] levofloxacin (LEVAQUIN) 500 MG tablet Take 1 tablet (500 mg total) by mouth daily. 11 tablet 0  . metoprolol (LOPRESSOR) 50 MG tablet Take 0.5 tablets (25 mg total) by mouth 2 (two) times daily. 30 tablet 11  . Multiple Vitamin (MULTIVITAMIN) tablet Take 1 tablet by mouth daily.    [name] triamterene-hydrochlorothiazide (MAXZIDE-25) 37.5-25 MG per tablet TAKE ONE-HALF TABLET BY MOUTH ONCE DAILY (Patient taking differently: Take 0.5 tablets by mouth daily. ) 30 tablet 5  . warfarin (COUMADIN) 2.5 MG tablet Take as directed  by coumadin clinic 35 tablet 3  . colchicine 0.6 MG tablet Take 0.6 mg by mouth 2 (two) times daily as needed (gout).    [name] HYDROcodone-acetaminophen (NORCO/VICODIN) 5-325 MG per tablet Take 0.5-1 tablets by mouth every 6 (six) hours as needed for moderate pain. 25 tablet 0  . traMADol (ULTRAM) 50 MG tablet Take 0.5-1 tablets (25-50 mg total) by mouth every 8 (eight) hours as needed. (Patient not taking: Reported on 10/23/2014) 30 tablet 0  . traZODone (DESYREL) 50 MG tablet Take 0.5-1 tablets (25-50 mg total) by mouth at bedtime as needed for sleep. (Patient not taking: Reported on 10/23/2014) 30 tablet 3   Objective: BP 118/90 mmHg  Pulse 80  Temp(Src) 97.8 F (36.6 C)  Wt 155 lb (70.308 kg) Gen: NAD, resting comfortably EY:[id number] irregular  With normal rateno murmurs rubs or gallops Lungs: CTAB no crackles, wheeze, rhonchi GU exam: normal right testicle, left testicle 3x as large as right at least (slightly worsened from last Friday's exam) Tenderness with palpation slightly worsened as well Ext: no edema Skin: warm, dry, no rash, left scrotal skin is mildly erythematous and slightly warm    Assessment/Plan:  Left Epididymitis On levaquin day 10, augmentin day 8. Had 1 CTX dose- continue both for now given stable exam but no clear improvement.  Enterococcus susceptible to both ampicillin and levaquin.  Ultrasound follow up planned for tomorrow Hold coumadin for 5 days starting today in case needs procedure (on for a fib only).  Refer to urology today-  has appointment Thursday. Given patient is stable, we did not push for sooner appointment especially since if needs procedure will have to be off coumadin anyway.  Last seen by cards on 03/22/14- I will be out of town this week and if surgical clearance needed- likely ideally through cards.   Continue to consider BPH meds in long run as possible cause of retained urine/infection  Please note prior HR was entered in error from last  visit. Was mildly elevated at 118 but on my exam was down to 90. Discussed this with family who concurs this is what I reported to them that 118 was entered as 218 in error.   Insomnia.  S: Patient having sleep issues not due to pain.  A/P: Trial full tab of the ambien short term. I do continue to wonder if this is actually due to testicular discomfort though he reports otherwise.   Meds ordered this encounter  Medications  . warfarin (COUMADIN) 2.5 MG tablet    Sig: Take as directed by coumadin clinic    Dispense:  35 tablet    Refill:  3    30 day

## 2014-10-30 ENCOUNTER — Ambulatory Visit
Admission: RE | Admit: 2014-10-30 | Discharge: 2014-10-30 | Disposition: A | Discharge status: Home / Self Care | Payer: Medicare Other | Source: Ambulatory Visit | Attending: Family Medicine | Admitting: Family Medicine

## 2014-10-30 DIAGNOSIS — N451 Epididymitis: Secondary | ICD-10-CM

## 2014-10-31 ENCOUNTER — Telehealth: Payer: Self-pay | Admitting: Family Medicine

## 2014-10-31 NOTE — Telephone Encounter (Signed)
Please see message and advise 

## 2014-10-31 NOTE — Telephone Encounter (Signed)
Pt daughter is requesting her dad doppler results. Daughter is aware md out of office this week. Pt has an appt with urologist tomorrow

## 2014-10-31 NOTE — Telephone Encounter (Signed)
I sent result note to [facility] and sent patient mychart message. We will essentially need urology's opinion on next steps and scheduled tomorrow.

## 2014-11-01 ENCOUNTER — Other Ambulatory Visit: Payer: Self-pay | Admitting: Urology

## 2014-11-01 ENCOUNTER — Encounter (HOSPITAL_BASED_OUTPATIENT_CLINIC_OR_DEPARTMENT_OTHER): Payer: Self-pay | Admitting: *Deleted

## 2014-11-01 NOTE — Anesthesia Preprocedure Evaluation (Addendum)
Anesthesia Evaluation  Patient identified by MRN, date of birth, ID band Patient awake    Reviewed: Allergy & Precautions, NPO status , Patient's Chart, lab work & pertinent test results  History of Anesthesia Complications Negative for: history of anesthetic complications  Airway Mallampati: II  TM Distance: >3 FB Neck ROM: Full    Dental no notable dental hx. (+) Dental Advisory Given   Pulmonary COPDformer smoker,  breath sounds clear to auscultation  Pulmonary exam normal       Cardiovascular Exercise Tolerance: Good hypertension, Pt. on medications Normal cardiovascular exam+ dysrhythmias Atrial Fibrillation Rhythm:Regular Rate:Normal     Neuro/Psych  Neuromuscular disease negative neurological ROS  negative psych ROS   GI/Hepatic negative GI ROS, Neg liver ROS, PUD,   Endo/Other  negative endocrine ROS  Renal/GU Renal diseasenegative Renal ROS  negative genitourinary   Musculoskeletal  (+) Arthritis -,   Abdominal   Peds negative pediatric ROS (+)  Hematology negative hematology ROS (+)   Anesthesia Other Findings   Reproductive/Obstetrics negative OB ROS                          Anesthesia Physical Anesthesia Plan  ASA: III  Anesthesia Plan: General   Post-op Pain Management:    Induction: Intravenous  Airway Management Planned: LMA  Additional Equipment:   Intra-op Plan:   Post-operative Plan: Extubation in OR  Informed Consent: I have reviewed the patients History and Physical, chart, labs and discussed the procedure including the risks, benefits and alternatives for the proposed anesthesia with the patient or authorized representative who has indicated his/her understanding and acceptance.   Dental advisory given  Plan Discussed with: CRNA  Anesthesia Plan Comments:         Anesthesia Quick Evaluation

## 2014-11-01 NOTE — Telephone Encounter (Signed)
Left detailed message on machine for daughter per DPR.

## 2014-11-01 NOTE — Progress Notes (Signed)
NPO AFTER MN.  ARRIVE AT 0700.  NEEDS PT/INR.  OTHER CURRENT LAB RESULTS AND EKG IN CHART AND EPIC.  WILL TAKE METOPROLOL AM DOS W/ SIPS OF WATER.

## 2014-11-02 ENCOUNTER — Ambulatory Visit (HOSPITAL_BASED_OUTPATIENT_CLINIC_OR_DEPARTMENT_OTHER): Payer: Medicare Other | Admitting: Anesthesiology

## 2014-11-02 ENCOUNTER — Encounter (HOSPITAL_BASED_OUTPATIENT_CLINIC_OR_DEPARTMENT_OTHER): Payer: Self-pay | Admitting: *Deleted

## 2014-11-02 ENCOUNTER — Encounter (HOSPITAL_BASED_OUTPATIENT_CLINIC_OR_DEPARTMENT_OTHER)
Admission: RE | Disposition: A | Discharge status: Home / Self Care | Payer: Self-pay | Source: Ambulatory Visit | Attending: Urology

## 2014-11-02 ENCOUNTER — Ambulatory Visit (HOSPITAL_BASED_OUTPATIENT_CLINIC_OR_DEPARTMENT_OTHER)
Admission: RE | Admit: 2014-11-02 | Discharge: 2014-11-02 | Disposition: A | Discharge status: Home / Self Care | Payer: Medicare Other | Source: Ambulatory Visit | Attending: Urology | Admitting: Urology

## 2014-11-02 DIAGNOSIS — Z792 Long term (current) use of antibiotics: Secondary | ICD-10-CM | POA: Insufficient documentation

## 2014-11-02 DIAGNOSIS — Z7901 Long term (current) use of anticoagulants: Secondary | ICD-10-CM | POA: Diagnosis not present

## 2014-11-02 DIAGNOSIS — M109 Gout, unspecified: Secondary | ICD-10-CM | POA: Diagnosis not present

## 2014-11-02 DIAGNOSIS — I4891 Unspecified atrial fibrillation: Secondary | ICD-10-CM | POA: Diagnosis not present

## 2014-11-02 DIAGNOSIS — I10 Essential (primary) hypertension: Secondary | ICD-10-CM | POA: Diagnosis not present

## 2014-11-02 DIAGNOSIS — J449 Chronic obstructive pulmonary disease, unspecified: Secondary | ICD-10-CM | POA: Insufficient documentation

## 2014-11-02 DIAGNOSIS — N453 Epididymo-orchitis: Secondary | ICD-10-CM | POA: Insufficient documentation

## 2014-11-02 DIAGNOSIS — N431 Infected hydrocele: Secondary | ICD-10-CM

## 2014-11-02 DIAGNOSIS — Z85048 Personal history of other malignant neoplasm of rectum, rectosigmoid junction, and anus: Secondary | ICD-10-CM | POA: Diagnosis not present

## 2014-11-02 DIAGNOSIS — M1990 Unspecified osteoarthritis, unspecified site: Secondary | ICD-10-CM | POA: Diagnosis not present

## 2014-11-02 DIAGNOSIS — N432 Other hydrocele: Secondary | ICD-10-CM | POA: Diagnosis present

## 2014-11-02 DIAGNOSIS — Z79899 Other long term (current) drug therapy: Secondary | ICD-10-CM | POA: Diagnosis not present

## 2014-11-02 DIAGNOSIS — Z87891 Personal history of nicotine dependence: Secondary | ICD-10-CM | POA: Diagnosis not present

## 2014-11-02 DIAGNOSIS — N289 Disorder of kidney and ureter, unspecified: Secondary | ICD-10-CM | POA: Insufficient documentation

## 2014-11-02 HISTORY — PX: MASS EXCISION: SHX2000

## 2014-11-02 HISTORY — DX: Complete loss of teeth, unspecified cause, unspecified class: K08.109

## 2014-11-02 HISTORY — DX: Localized skin eruption due to drugs and medicaments taken internally: L27.1

## 2014-11-02 HISTORY — DX: Diverticulosis of large intestine without perforation or abscess without bleeding: K57.30

## 2014-11-02 HISTORY — DX: Personal history of other malignant neoplasm of rectum, rectosigmoid junction, and anus: Z85.048

## 2014-11-02 HISTORY — DX: Urge incontinence: N39.41

## 2014-11-02 HISTORY — DX: Personal history of other diseases of the musculoskeletal system and connective tissue: Z87.39

## 2014-11-02 HISTORY — DX: Other specified postprocedural states: Z98.890

## 2014-11-02 HISTORY — DX: Unspecified hearing loss, unspecified ear: H91.90

## 2014-11-02 HISTORY — DX: Cyst of epididymis: N50.3

## 2014-11-02 HISTORY — DX: Personal history of other diseases of the digestive system: Z87.19

## 2014-11-02 HISTORY — DX: Chronic obstructive pulmonary disease, unspecified: J44.9

## 2014-11-02 HISTORY — DX: Hyperlipidemia, unspecified: E78.5

## 2014-11-02 HISTORY — DX: Sciatica, right side: M54.31

## 2014-11-02 HISTORY — DX: Disorder of kidney and ureter, unspecified: N28.9

## 2014-11-02 HISTORY — DX: Chronic atrial fibrillation, unspecified: I48.20

## 2014-11-02 HISTORY — DX: Unspecified right bundle-branch block: I45.10

## 2014-11-02 HISTORY — DX: Unspecified osteoarthritis, unspecified site: M19.90

## 2014-11-02 HISTORY — DX: Other fecal abnormalities: R19.5

## 2014-11-02 HISTORY — DX: Other specified postprocedural states: Z85.820

## 2014-11-02 HISTORY — DX: Other specified postprocedural states: Z85.9

## 2014-11-02 HISTORY — PX: INCISION AND DRAINAGE ABSCESS: SHX5864

## 2014-11-02 HISTORY — DX: Complete loss of teeth, unspecified cause, unspecified class: Z97.2

## 2014-11-02 LAB — PROTIME-INR
INR: 1.46 (ref 0.00–1.49)
Prothrombin Time: 17.8 seconds — ABNORMAL HIGH (ref 11.6–15.2)

## 2014-11-02 SURGERY — EXCISION MASS
Anesthesia: General | Site: Scrotum

## 2014-11-02 MED ORDER — PROPOFOL 10 MG/ML IV BOLUS
INTRAVENOUS | Status: DC | PRN
  Administered 2014-11-02: 80 mg via INTRAVENOUS

## 2014-11-02 MED ORDER — ONDANSETRON HCL 4 MG/2ML IJ SOLN
INTRAMUSCULAR | Status: DC | PRN
  Administered 2014-11-02: 4 mg via INTRAVENOUS

## 2014-11-02 MED ORDER — SODIUM CHLORIDE 0.9 % IR SOLN
Status: DC | PRN
  Administered 2014-11-02: 500 mL

## 2014-11-02 MED ORDER — BUPIVACAINE HCL (PF) 0.25 % IJ SOLN
INTRAMUSCULAR | Status: DC | PRN
Start: 2014-11-02 — End: 2014-11-02
  Administered 2014-11-02: 28 mL

## 2014-11-02 MED ORDER — LIDOCAINE HCL (CARDIAC) 20 MG/ML IV SOLN
INTRAVENOUS | Status: DC | PRN
  Administered 2014-11-02: 50 mg via INTRAVENOUS

## 2014-11-02 MED ORDER — DEXAMETHASONE SODIUM PHOSPHATE 4 MG/ML IJ SOLN
INTRAMUSCULAR | Status: DC | PRN
  Administered 2014-11-02: 10 mg via INTRAVENOUS

## 2014-11-02 MED ORDER — LACTATED RINGERS IV SOLN
INTRAVENOUS | Status: DC
  Administered 2014-11-02: 08:00:00 via INTRAVENOUS
  Filled 2014-11-02: qty 1000

## 2014-11-02 MED ORDER — ACETAMINOPHEN-CODEINE #3 300-30 MG PO TABS: 1.0000 | ORAL_TABLET | ORAL | Status: DC | PRN

## 2014-11-02 MED ORDER — TRAMADOL HCL 50 MG PO TABS
ORAL_TABLET | ORAL | Status: AC
  Filled 2014-11-02: qty 1

## 2014-11-02 MED ORDER — FENTANYL CITRATE (PF) 100 MCG/2ML IJ SOLN
INTRAMUSCULAR | Status: AC
Start: 2014-11-02 — End: 2014-11-02
  Filled 2014-11-02: qty 4

## 2014-11-02 MED ORDER — CEFAZOLIN SODIUM-DEXTROSE 2-3 GM-% IV SOLR
INTRAVENOUS | Status: AC
  Filled 2014-11-02: qty 50

## 2014-11-02 MED ORDER — ONDANSETRON HCL 4 MG/2ML IJ SOLN
4.0000 mg | Freq: Once | INTRAMUSCULAR | Status: DC | PRN
  Filled 2014-11-02: qty 2

## 2014-11-02 MED ORDER — FENTANYL CITRATE (PF) 100 MCG/2ML IJ SOLN
25.0000 ug | INTRAMUSCULAR | Status: DC | PRN
  Filled 2014-11-02: qty 1

## 2014-11-02 MED ORDER — TRAMADOL HCL 50 MG PO TABS
50.0000 mg | ORAL_TABLET | Freq: Once | ORAL | Status: AC
  Administered 2014-11-02: 50 mg via ORAL
  Filled 2014-11-02: qty 1

## 2014-11-02 MED ORDER — PHENYLEPHRINE HCL 10 MG/ML IJ SOLN
10.0000 mg | INTRAMUSCULAR | Status: DC | PRN
  Administered 2014-11-02: 50 ug/min via INTRAVENOUS

## 2014-11-02 MED ORDER — FENTANYL CITRATE (PF) 100 MCG/2ML IJ SOLN
INTRAMUSCULAR | Status: DC | PRN
  Administered 2014-11-02: 50 ug via INTRAVENOUS
  Administered 2014-11-02: 25 ug via INTRAVENOUS

## 2014-11-02 MED ORDER — MIDAZOLAM HCL 2 MG/2ML IJ SOLN
INTRAMUSCULAR | Status: AC
  Filled 2014-11-02: qty 2

## 2014-11-02 SURGICAL SUPPLY — 38 items
BLADE CLIPPER SURG (BLADE) ×3 IMPLANT
BLADE SURG 15 STRL LF DISP TIS (BLADE) ×1 IMPLANT
BLADE SURG 15 STRL SS (BLADE) ×2
BNDG GAUZE ELAST 4 BULKY (GAUZE/BANDAGES/DRESSINGS) ×3 IMPLANT
BRIEF STRETCH FOR OB PAD LRG (UNDERPADS AND DIAPERS) ×6 IMPLANT
CANISTER SUCTION 2500CC (MISCELLANEOUS) ×3 IMPLANT
CLEANER CAUTERY TIP 5X5 PAD (MISCELLANEOUS) ×1 IMPLANT
COVER BACK TABLE 60X90IN (DRAPES) ×3 IMPLANT
COVER MAYO STAND STRL (DRAPES) ×3 IMPLANT
DRAPE PED LAPAROTOMY (DRAPES) ×3 IMPLANT
ELECT NEEDLE BLADE 2-5/6 (NEEDLE) ×3 IMPLANT
ELECT REM PT RETURN 9FT ADLT (ELECTROSURGICAL) ×3
ELECTRODE REM PT RTRN 9FT ADLT (ELECTROSURGICAL) ×1 IMPLANT
GAUZE SPONGE 4X4 16PLY XRAY LF (GAUZE/BANDAGES/DRESSINGS) IMPLANT
GLOVE BIO SURGEON STRL SZ7.5 (GLOVE) ×3 IMPLANT
GLOVE INDICATOR 7.5 STRL GRN (GLOVE) ×6 IMPLANT
GLOVE SURG SS PI 7.5 STRL IVOR (GLOVE) ×3 IMPLANT
GOWN STRL REUS W/ TWL LRG LVL3 (GOWN DISPOSABLE) ×1 IMPLANT
GOWN STRL REUS W/ TWL XL LVL3 (GOWN DISPOSABLE) ×2 IMPLANT
GOWN STRL REUS W/TWL LRG LVL3 (GOWN DISPOSABLE) ×2
GOWN STRL REUS W/TWL XL LVL3 (GOWN DISPOSABLE) ×4
NEEDLE HYPO 22GX1.5 SAFETY (NEEDLE) ×3 IMPLANT
PACK BASIN DAY SURGERY FS (CUSTOM PROCEDURE TRAY) ×3 IMPLANT
PAD CLEANER CAUTERY TIP 5X5 (MISCELLANEOUS) ×2
PENCIL BUTTON HOLSTER BLD 10FT (ELECTRODE) ×3 IMPLANT
SPONGE GAUZE 4X4 12PLY STER LF (GAUZE/BANDAGES/DRESSINGS) ×3 IMPLANT
SUT MNCRL AB 4-0 PS2 18 (SUTURE) ×3 IMPLANT
SUT VIC AB 3-0 SH 27 (SUTURE) ×4
SUT VIC AB 3-0 SH 27X BRD (SUTURE) ×2 IMPLANT
SUT VICRYL 3 0 BR 18  UND (SUTURE) ×2
SUT VICRYL 3 0 BR 18 UND (SUTURE) ×1 IMPLANT
SYR BULB IRRIGATION 50ML (SYRINGE) ×3 IMPLANT
SYR CONTROL 10ML LL (SYRINGE) ×3 IMPLANT
TOWEL OR 17X24 6PK STRL BLUE (TOWEL DISPOSABLE) ×6 IMPLANT
TRAY DSU PREP LF (CUSTOM PROCEDURE TRAY) ×3 IMPLANT
TUBE CONNECTING 12'X1/4 (SUCTIONS) ×1
TUBE CONNECTING 12X1/4 (SUCTIONS) ×2 IMPLANT
YANKAUER SUCT BULB TIP NO VENT (SUCTIONS) ×3 IMPLANT

## 2014-11-02 NOTE — Op Note (Signed)
Preoperative diagnosis:  1. left loculated hydrocele   Postoperative diagnosis:  1. left loculated hydrocele  Procedure:       left hydrocelectomy  Surgeon: Ardis Hughs, MD  Anesthesia: General  Complications: None  Intraoperative findings: small loculated hydrocele sacks, Which were excised.  The left hemiscrotum was irrigated with antibiotic solution prior to closure.  The Penrose drain was not left. The testicle was somewhat mottled at the lower pole but viable.  EBL: Minimal  Specimens: None  Indication: Indication: William Mahoney is a 81 y.o. patient who developed severe epididymal orchitis.  Ultimately, the patient had a reactive hydrocele.  Ultrasound revealed a loculated fluid collection.  There was concern that this may be abscess.  The patient had  A warm and red scrotum on exam.  After reviewing the management options for treatment, he elected to proceed with the above surgical procedure(s). We have discussed the potential benefits and risks of the procedure, side effects of the proposed treatment, the likelihood of the patient achieving the goals of the procedure, and any potential problems that might occur during the procedure or recuperation. Informed consent has been obtained.  Description of procedure:  The patient was taken to the operating room and general anesthesia was induced. The patient was placed on the table in supine position, general anesthesia was then induced and an LMA inserted. The scrotum was then prepped and draped in the routine sterile fashion. A timeout was then held with confirmation of antibiotics.  I then made a midline incision through the scrotal median raphae through the skin and into the dartos. Once through several layers the dartos was able to get the left testicle and contents out of the left  hemiscrotum and into the surgical field.    The hydrocele sacs were then dissected out removing the overlying layers of the dartos tunica.   The sac was then opened with Metzenbaum scissors and the fluid drained from the sac.  The opening was then continued so that the entire sac was bivalved.  The edges were then removed, leaving a small edge of tissue around the testicle.  The edge of the remaining sac was cauterized.  The edge was then everted, brought together around the posterior aspect of the testicle and closed in a running fashion using 3-0  Vicryl.  The distal aspect was left open to prevent strangulating the cord.   Meticulous hemostasis was then achieved. The left hemiscrotum was then copiously irrigated With antibiotic irrigation and a final check for hemostasis performed. I then closed the dartos with a 3-0 Vicryl in a running/locking stitch. I closed the skin with a 4-0 Monocryl in a vertical mattress running fashion. I then injected 10 cc of quarter percent Marcaine into the incision, and then placed Dermabond over the incision. A fluff dressing and mesh underpants were then applied area.  The patient tolerated the procedure without any perioperative complications. At the end of the case all last needles and sponges had been accounted for. The patient was returned to the PACU in excellent condition.   Ardis Hughs, M.D.

## 2014-11-02 NOTE — Transfer of Care (Signed)
Immediate Anesthesia Transfer of Care Note  Patient: William Mahoney  Procedure(s) Performed: Procedure(s): EXCISION OF EPIDIDYMAL CYST (N/A) INCISION AND DRAINAGE OF SCROTUM (N/A)  Patient Location: PACU  Anesthesia Type:General  Level of Consciousness: awake, alert , oriented and patient cooperative  Airway & Oxygen Therapy: Patient Spontanous Breathing and Patient connected to nasal cannula oxygen  Post-op Assessment: Report given to RN and Post -op Vital signs reviewed and stable  Post vital signs: Reviewed and stable  Last Vitals:  Filed Vitals:   11/02/14 0713  BP: 120/82  Pulse: 59  Temp: 36.8 C  Resp: 16    Complications: No apparent anesthesia complications

## 2014-11-02 NOTE — Anesthesia Postprocedure Evaluation (Signed)
  Anesthesia Post-op Note  Patient: William Mahoney  Procedure(s) Performed: Procedure(s) (LRB): EXCISION OF EPIDIDYMAL CYST (N/A) INCISION AND DRAINAGE OF SCROTUM (N/A)  Patient Location: PACU  Anesthesia Type: General  Level of Consciousness: awake and alert   Airway and Oxygen Therapy: Patient Spontanous Breathing  Post-op Pain: mild  Post-op Assessment: Post-op Vital signs reviewed, Patient's Cardiovascular Status Stable, Respiratory Function Stable, Patent Airway and No signs of Nausea or vomiting  Last Vitals:  Filed Vitals:   11/02/14 1115  BP: 107/73  Pulse: 67  Temp:   Resp: 18    Post-op Vital Signs: stable   Complications: No apparent anesthesia complications

## 2014-11-02 NOTE — H&P (Signed)
Reason For Visit left epididymitis   History of Present Illness 15M referred by Dr. [name], MD for eval and management of a left epididymitis with a reactive complex hydrocele.  Patient initially presented to Dr. Yong Channel 2 weeks prior with lower abdominal pain, testicular pain and progressive voiding symptoms. He was diagnosed with epididymitis. His cultures have grown enterococcus. He was treated with Augmentin and Levoquin - enterococcus sensative to both . He has had serial u/s which showed an evolving complex hydrocele. Pain has had improvement in his pain and voiding symptoms. His scrotum remains edematous and swollen. No on-going fevers.   Patient does not take any medications for BPH or voiding symptoms.   The patient states that in the interval his pain has improved slightly. He denies any fevers or dysuria.   IPSS: 12 (always has a weak stream), QoL 4  PVR: 8 mL     Past Medical History Problems  1. History of Arthritis 2. History of Gout (M10.9) 3. History of hypertension (Z86.79) 4. History of Rectal Cancer  Surgical History Problems  1. History of Cholecystectomy  Current Meds 1. Allopurinol 300 MG Oral Tablet;  Therapy: (Recorded:14Jul2016) to Recorded 2. Amoxicillin-Pot Clavulanate 875-125 MG Oral Tablet;  Therapy: (Recorded:14Jul2016) to Recorded 3. Levofloxacin 500 MG Oral Tablet;  Therapy: (Recorded:14Jul2016) to Recorded 4. Warfarin Sodium 2.5 MG Oral Tablet;  Therapy: (Recorded:14Jul2016) to Recorded  Allergies Medication  1. No Known Drug Allergies  Family History Problems  1. Family history of Cancer : Father 2. Family history of Death In The Family Father : Father   CancerAge 60 3. Family history of Death In The Family Mother : Mother   Age of death was 54 4. Family history of Family Health Status Number Of Children   3 sons3 daughters  Social History Problems    Denied: History of Alcohol Use   Caffeine Use   2   Marital  History - Currently Married   Retired From Work   Tobacco Use   1 ppd for 65 yrs  Review of Systems Genitourinary, constitutional, skin, eye, otolaryngeal, hematologic/lymphatic, cardiovascular, pulmonary, endocrine, musculoskeletal, gastrointestinal, neurological and psychiatric system(s) were reviewed and pertinent findings if present are noted and are otherwise negative.  Genitourinary: feelings of urinary urgency, nocturia and incontinence.  Gastrointestinal: nausea and diarrhea.  Hematologic/Lymphatic: a tendency to easily bruise.    Vitals Vital Signs [Data Includes: Last 1 Day]  Recorded: [id number] 11:04AM  Blood Pressure: 110 / 82 Heart Rate: 56 Recorded: 14Jul2016 11:01AM  Height: 5 ft 11 in Weight: 155 lb  BMI Calculated: 21.62 BSA Calculated: 1.89  Physical Exam Constitutional: Well nourished and well developed . No acute distress.  ENT:. The ears and nose are normal in appearance.  Neck: The appearance of the neck is normal and no neck mass is present.  Pulmonary: No respiratory distress and normal respiratory rhythm and effort.  Cardiovascular:. No peripheral edema. Irregular  Abdomen: The abdomen is soft and nontender. No masses are palpated. No CVA tenderness. No hernias are palpable. No hepatosplenomegaly noted.  Genitourinary: The scrotum is edematous, erythematous and tender. Examination of the right scrotum demonstrates no hydrocele. Examination of the left scrotum demostrates a hydrocele. The right epididymis is not enlarged. The right testis is palpably normal. The left testis is tender.  Lymphatics: The femoral and inguinal nodes are not enlarged or tender.  Skin: Normal skin turgor, no visible rash and no visible skin lesions.  Neuro/Psych:. Mood and affect are appropriate.  Results/Data I independently reviewed the patient's previous ultrasounds demonstrating a loculated left hydrocele.     Assessment Assessed  1. Left epididymitis (N45.1) 2. Left  hydrocele (N43.3)  Plan  URINE CULTURE; Status:Hold For - Specimen/Data Collection,Appointment; Requested for:14Jul2016;  Perform:[facility]; Due:16Jul2016; Marked Important; Last Updated PH:KFEXMD, Debbie; 11/01/2014 12:08:38 PM;Ordered;  YJW:LKHV epididymitis; Ordered FM:[id number], [name];   Discussion/Summary The patient has a warm tender left hemiscrotum and ultrasound showing a loculated hydrocele with a history of epididymitis. I recommended that we take the patient to the operating room for drainage of the hydrocele to be safe which would allow [place] to ensure that there is no abscess developing. He has been off his Coumadin since Monday, we'll try to get him worked in tomorrow morning. Following the operation, I plan to leave a Penrose drain. We also discussed the alternative of surveillance given the pain and swelling was slightly better. However, his scrotum is warm and tender, and and this to me deserves more acute attention

## 2014-11-02 NOTE — Anesthesia Procedure Notes (Signed)
Procedure Name: LMA Insertion Date/Time: 11/02/2014 9:15 AM Performed by: Wanita Chamberlain Pre-anesthesia Checklist: Patient identified, Timeout performed, Emergency Drugs available, Suction available and Patient being monitored Patient Re-evaluated:Patient Re-evaluated prior to inductionOxygen Delivery Method: Circle system utilized Preoxygenation: Pre-oxygenation with 100% oxygen Intubation Type: IV induction Ventilation: Mask ventilation without difficulty LMA: LMA inserted LMA Size: 4.0 Number of attempts: 1 Placement Confirmation: positive ETCO2 and breath sounds checked- equal and bilateral Tube secured with: Tape Dental Injury: Teeth and Oropharynx as per pre-operative assessment

## 2014-11-02 NOTE — Discharge Instructions (Signed)
Discharge instructions following scrotal surgery  Resume coumadin Monday  Continue Augmentin for 5 days after surgery  Call your doctor for:  Fever is greater than 100.5  Severe nausea or vomiting  Increasing pain not controlled by pain medication  Increasing redness or drainage from incisions  The number for questions or concerns is [phone]  Activity level: No lifting greater than 10 pounds (about equal to milk) for the next 2 weeks or until cleared to do so at follow-up appointment.  Otherwise activity as tolerated by comfort level.  Diet: May resume your regular diet as tolerated  Driving: No driving while still taking opiate pain medications (weight at least 6-8 hours after last dose).  No driving if you still sore from surgery as it may limit her ability to react quickly if necessary.   Shower/bath: May shower and get incision wet pad dry immediately following.  Do not scrub vigorously for the next 2-3 weeks.  Do not soak incision (ID soaking in bath or swimming) until told he may do so by Dr., as this may promote a wound infection.  Wound care: He may cover wounds with sterile gauze as needed to prevent incisions rubbing on close follow-up in any seepage.  Where tight fitting underpants for at least 2 weeks.  He should apply cold compresses (ice or sac of frozen peas/corn) to your scrotum for at least 48 hours to reduce the swelling.  You should expect that his scrotum will swell up initially and then get smaller over the next 2-4 weeks.  Follow-up appointments: Follow-up appointment will be scheduled with Dr. Louis Meckel in 6 weeks for a wound check.   Post Anesthesia Home Care Instructions  Activity: Get plenty of rest for the remainder of the day. A responsible adult should stay with you for 24 hours following the procedure.  For the next 24 hours, DO NOT: -Drive a car -Paediatric nurse -Drink alcoholic beverages -Take any medication unless instructed by your  physician -Make any legal decisions or sign important papers.  Meals: Start with liquid foods such as gelatin or soup. Progress to regular foods as tolerated. Avoid greasy, spicy, heavy foods. If nausea and/or vomiting occur, drink only clear liquids until the nausea and/or vomiting subsides. Call your physician if vomiting continues.  Special Instructions/Symptoms: Your throat may feel dry or sore from the anesthesia or the breathing tube placed in your throat during surgery. If this causes discomfort, gargle with warm salt water. The discomfort should disappear within 24 hours.  If you had a scopolamine patch placed behind your ear for the management of post- operative nausea and/or vomiting:  1. The medication in the patch is effective for 72 hours, after which it should be removed.  Wrap patch in a tissue and discard in the trash. Wash hands thoroughly with soap and water. 2. You may remove the patch earlier than 72 hours if you experience unpleasant side effects which may include dry mouth, dizziness or visual disturbances. 3. Avoid touching the patch. Wash your hands with soap and water after contact with the patch.

## 2014-11-05 ENCOUNTER — Encounter (HOSPITAL_BASED_OUTPATIENT_CLINIC_OR_DEPARTMENT_OTHER): Payer: Self-pay | Admitting: Urology

## 2014-11-08 ENCOUNTER — Ambulatory Visit: Payer: Medicare Other

## 2014-11-14 ENCOUNTER — Ambulatory Visit: Payer: Medicare Other | Admitting: Family Medicine

## 2014-11-19 ENCOUNTER — Ambulatory Visit (INDEPENDENT_AMBULATORY_CARE_PROVIDER_SITE_OTHER): Payer: Medicare Other | Admitting: General Practice

## 2014-11-19 DIAGNOSIS — Z5181 Encounter for therapeutic drug level monitoring: Secondary | ICD-10-CM | POA: Diagnosis not present

## 2014-11-19 LAB — POCT INR: INR: 2

## 2014-11-19 NOTE — Progress Notes (Signed)
Pre visit review using our clinic review tool, if applicable. No additional management support is needed unless otherwise documented below in the visit note. 

## 2014-11-26 ENCOUNTER — Telehealth: Payer: Self-pay | Admitting: Family Medicine

## 2014-11-26 MED ORDER — ALLOPURINOL 300 MG PO TABS: 150.0000 mg | ORAL_TABLET | Freq: Every evening | ORAL | Status: DC

## 2014-11-26 NOTE — Telephone Encounter (Signed)
Refill request for Allopurinol 300 mg take 1 po qd and send to [place].

## 2014-11-26 NOTE — Telephone Encounter (Signed)
Medication refilled

## 2014-12-03 ENCOUNTER — Ambulatory Visit (INDEPENDENT_AMBULATORY_CARE_PROVIDER_SITE_OTHER): Payer: Medicare Other | Admitting: Family Medicine

## 2014-12-03 ENCOUNTER — Encounter: Payer: Self-pay | Admitting: Family Medicine

## 2014-12-03 VITALS — BP 122/80 | HR 58 | Temp 97.9°F | Wt 160.0 lb

## 2014-12-03 DIAGNOSIS — N433 Hydrocele, unspecified: Secondary | ICD-10-CM

## 2014-12-03 DIAGNOSIS — N451 Epididymitis: Secondary | ICD-10-CM

## 2014-12-03 NOTE — Progress Notes (Signed)
[name], MD  Subjective:  William Mahoney is a 81 y.o. year old very pleasant male patient who presents with:  Epididymitis with reactive hydrocele now s/p surgical intervention for loculations/drainage. Improved.  Finished antibiotics.  Tylenol #3 most nights really to help relaxation, ice packs. For BPH- No change in symptoms with flomax. Feels like leakage is worse. Up 1-2x a night. Likely needed given history severe epididymitis but will discuss with urolgoy on return  ROS- no fever/chills/nausea/vomiting  Past Medical History- a fib, history colon cancer, insomnia, HLD, Gout, HTN, COPD untreate  Medications- reviewed and updated Current Outpatient Prescriptions  Medication Sig Dispense Refill  . allopurinol (ZYLOPRIM) 300 MG tablet Take 0.5 tablets (150 mg total) by mouth every evening. 90 tablet 2  . colchicine 0.6 MG tablet Take 0.6 mg by mouth 2 (two) times daily as needed (gout).    . fish oil-omega-3 fatty acids 1000 MG capsule Take 1 g by mouth daily.    [name] loperamide (IMODIUM A-D) 2 MG tablet Take 2 mg by mouth 4 (four) times daily as needed for diarrhea or loose stools.    . metoprolol (LOPRESSOR) 50 MG tablet Take 0.5 tablets (25 mg total) by mouth 2 (two) times daily. 30 tablet 11  . Multiple Vitamin (MULTIVITAMIN) tablet Take 1 tablet by mouth daily.    . tamsulosin (FLOMAX) 0.4 MG CAPS capsule Take 0.4 mg by mouth.    . triamterene-hydrochlorothiazide (MAXZIDE-25) 37.5-25 MG per tablet TAKE ONE-HALF TABLET BY MOUTH ONCE DAILY (Patient taking differently: Take 0.5 tablets by mouth every evening. ) 30 tablet 5  . acetaminophen-codeine (TYLENOL #3) 300-30 MG per tablet Take 1 tablet by mouth every 4 (four) hours as needed. (Patient not taking: Reported on 12/03/2014) 20 tablet 0   No current facility-administered medications for this visit.    Objective: BP 122/80 mmHg  Pulse 58  Temp(Src) 97.9 F (36.6 C)  Wt 160 lb (72.576 kg) Gen: NAD, resting comfortably CV:  slightly brady, irregularly irregular, no murmurs rubs or gallops Lungs: CTAB no crackles, wheeze, rhonchi Abdomen: soft/nontender/nondistended/normal bowel sounds.  Ext: no edema GU: Left testicle much smaller than previous visits, still mildly tender to touch, s/p surgery Skin: warm, dry, no rash Neuro: grossly normal, moves all extremities   Assessment/Plan:  Left epididymitis Hydrocele, left s/p surgical repair Finished antibiotic course. S/p surgery and healing. Still some residual inflammation. Would continue flomax unless urology recommends against- has some baseline incontinence issues and may have to be stopped though  Meds ordered this encounter  Medications  . tamsulosin (FLOMAX) 0.4 MG CAPS capsule    Sig: Take 0.4 mg by mouth.

## 2014-12-03 NOTE — Patient Instructions (Signed)
Medication Instructions:  Same, see list Continue the flomax/tamsulosin until you see urology  Follow-Up: 3 months May see each other every 6 months after that

## 2014-12-31 ENCOUNTER — Ambulatory Visit: Payer: Medicare Other

## 2014-12-31 ENCOUNTER — Ambulatory Visit (INDEPENDENT_AMBULATORY_CARE_PROVIDER_SITE_OTHER): Payer: Medicare Other | Admitting: General Practice

## 2014-12-31 DIAGNOSIS — I482 Chronic atrial fibrillation, unspecified: Secondary | ICD-10-CM

## 2014-12-31 DIAGNOSIS — Z5181 Encounter for therapeutic drug level monitoring: Secondary | ICD-10-CM | POA: Diagnosis not present

## 2014-12-31 DIAGNOSIS — Z7901 Long term (current) use of anticoagulants: Secondary | ICD-10-CM | POA: Diagnosis not present

## 2014-12-31 LAB — POCT INR: INR: 2.4

## 2014-12-31 NOTE — Progress Notes (Signed)
Pre visit review using our clinic review tool, if applicable. No additional management support is needed unless otherwise documented below in the visit note. 

## 2015-01-01 ENCOUNTER — Other Ambulatory Visit: Payer: Self-pay | Admitting: Family Medicine

## 2015-01-01 NOTE — Telephone Encounter (Signed)
Refill ok? 

## 2015-01-01 NOTE — Telephone Encounter (Signed)
Yes thanks 

## 2015-01-03 ENCOUNTER — Ambulatory Visit: Payer: Medicare Other | Admitting: Family Medicine

## 2015-01-29 ENCOUNTER — Other Ambulatory Visit: Payer: Self-pay | Admitting: Family Medicine

## 2015-01-30 ENCOUNTER — Other Ambulatory Visit: Payer: Self-pay

## 2015-02-11 ENCOUNTER — Ambulatory Visit (INDEPENDENT_AMBULATORY_CARE_PROVIDER_SITE_OTHER): Payer: Medicare Other | Admitting: General Practice

## 2015-02-11 DIAGNOSIS — Z7901 Long term (current) use of anticoagulants: Secondary | ICD-10-CM

## 2015-02-11 DIAGNOSIS — I482 Chronic atrial fibrillation, unspecified: Secondary | ICD-10-CM

## 2015-02-11 DIAGNOSIS — Z5181 Encounter for therapeutic drug level monitoring: Secondary | ICD-10-CM

## 2015-02-11 LAB — POCT INR: INR: 1.8

## 2015-02-11 NOTE — Progress Notes (Signed)
Pre visit review using our clinic review tool, if applicable. No additional management support is needed unless otherwise documented below in the visit note. 

## 2015-02-26 ENCOUNTER — Other Ambulatory Visit: Payer: Self-pay | Admitting: Family Medicine

## 2015-03-04 LAB — POCT INR: INR: 1.8

## 2015-03-11 ENCOUNTER — Other Ambulatory Visit: Payer: Self-pay | Admitting: General Practice

## 2015-03-11 ENCOUNTER — Ambulatory Visit (INDEPENDENT_AMBULATORY_CARE_PROVIDER_SITE_OTHER): Payer: Medicare Other | Admitting: General Practice

## 2015-03-11 DIAGNOSIS — Z5181 Encounter for therapeutic drug level monitoring: Secondary | ICD-10-CM

## 2015-03-11 DIAGNOSIS — Z7901 Long term (current) use of anticoagulants: Secondary | ICD-10-CM | POA: Diagnosis not present

## 2015-03-11 DIAGNOSIS — I482 Chronic atrial fibrillation, unspecified: Secondary | ICD-10-CM

## 2015-03-11 MED ORDER — WARFARIN SODIUM 2.5 MG PO TABS: ORAL_TABLET | ORAL | Status: DC

## 2015-03-11 NOTE — Progress Notes (Signed)
Pre visit review using our clinic review tool, if applicable. No additional management support is needed unless otherwise documented below in the visit note. 

## 2015-03-22 ENCOUNTER — Ambulatory Visit: Payer: Medicare Other | Admitting: Cardiovascular Disease

## 2015-04-08 ENCOUNTER — Ambulatory Visit (INDEPENDENT_AMBULATORY_CARE_PROVIDER_SITE_OTHER): Payer: Medicare Other | Admitting: General Practice

## 2015-04-08 DIAGNOSIS — I482 Chronic atrial fibrillation, unspecified: Secondary | ICD-10-CM

## 2015-04-08 DIAGNOSIS — Z7901 Long term (current) use of anticoagulants: Secondary | ICD-10-CM | POA: Diagnosis not present

## 2015-04-08 LAB — POCT INR: INR: 2.4

## 2015-04-08 NOTE — Progress Notes (Signed)
Pre visit review using our clinic review tool, if applicable. No additional management support is needed unless otherwise documented below in the visit note. 

## 2015-04-09 ENCOUNTER — Ambulatory Visit: Payer: Medicare Other | Admitting: Cardiovascular Disease

## 2015-05-06 ENCOUNTER — Ambulatory Visit (INDEPENDENT_AMBULATORY_CARE_PROVIDER_SITE_OTHER): Payer: Medicare Other | Admitting: General Practice

## 2015-05-06 DIAGNOSIS — Z5181 Encounter for therapeutic drug level monitoring: Secondary | ICD-10-CM | POA: Diagnosis not present

## 2015-05-06 DIAGNOSIS — I4891 Unspecified atrial fibrillation: Secondary | ICD-10-CM

## 2015-05-06 DIAGNOSIS — Z7901 Long term (current) use of anticoagulants: Secondary | ICD-10-CM | POA: Diagnosis not present

## 2015-05-06 DIAGNOSIS — I482 Chronic atrial fibrillation, unspecified: Secondary | ICD-10-CM

## 2015-05-06 LAB — POCT INR: INR: 2.6

## 2015-05-06 NOTE — Progress Notes (Signed)
Pre visit review using our clinic review tool, if applicable. No additional management support is needed unless otherwise documented below in the visit note. 

## 2015-05-21 ENCOUNTER — Encounter: Payer: Self-pay | Admitting: Cardiovascular Disease

## 2015-05-21 ENCOUNTER — Ambulatory Visit (INDEPENDENT_AMBULATORY_CARE_PROVIDER_SITE_OTHER): Payer: Medicare Other | Admitting: Cardiovascular Disease

## 2015-05-21 VITALS — BP 118/70 | HR 56 | Ht 71.0 in | Wt 165.0 lb

## 2015-05-21 DIAGNOSIS — I10 Essential (primary) hypertension: Secondary | ICD-10-CM

## 2015-05-21 DIAGNOSIS — I482 Chronic atrial fibrillation, unspecified: Secondary | ICD-10-CM

## 2015-05-21 NOTE — Progress Notes (Signed)
05/21/2015 [name]   Feb 15, 1926  [id number]  Primary Physician [name], MD Primary Cardiologist: Lorretta Harp MD Renae Gloss   HPI:  Mr. Lubinski is a very pleasant 82 year old thin-appearing married Caucasian male father of 6 children, grandfather of 35 grandchildren referred for preoperative clearance before a elective right inguinal hernia repair which was successfully performed by Dr. Dalbert Batman. His primary care physician is Dr. Yong Channel.  I last saw him in the office 03/22/14.His cardiovascular risk factor profile is notable for 90 pack years of tobacco abuse having quit 6 years ago. History of hypertension. He has never had a heart attack or stroke. He has had colon cancer which was surgically excised profile/31/09 after she stops smoking. He does have chronic atrial fibrillation only on aspirin though his CHA2DSVASC2 score is 2. I did begin him on Coumadin anticoagulation as a result of this.He had a stress test 15 years ago which led to cardiac catheterization which was apparently normal. He denies chest pain or shortness of breath.   Current Outpatient Prescriptions  Medication Sig Dispense Refill  . acetaminophen-codeine (TYLENOL #3) 300-30 MG per tablet Take 1 tablet by mouth every 4 (four) hours as needed. 20 tablet 0  . allopurinol (ZYLOPRIM) 300 MG tablet Take 0.5 tablets (150 mg total) by mouth every evening. 90 tablet 2  . colchicine 0.6 MG tablet Take 0.6 mg by mouth 2 (two) times daily as needed (gout).    . fish oil-omega-3 fatty acids 1000 MG capsule Take 1 g by mouth daily.    [name] loperamide (IMODIUM A-D) 2 MG tablet Take 2 mg by mouth 4 (four) times daily as needed for diarrhea or loose stools.    . metoprolol (LOPRESSOR) 50 MG tablet TAKE ONE-HALF TABLET BY MOUTH TWICE DAILY 30 tablet 5  . Multiple Vitamin (MULTIVITAMIN) tablet Take 1 tablet by mouth daily.    . tamsulosin (FLOMAX) 0.4 MG CAPS capsule Take 0.4 mg by mouth.    .  triamterene-hydrochlorothiazide (MAXZIDE-25) 37.5-25 MG tablet TAKE ONE-HALF TABLET BY MOUTH ONCE DAILY 30 tablet 5  . warfarin (COUMADIN) 2.5 MG tablet Take as directed by anticoagulation clinic 40 tablet 2  . zolpidem (AMBIEN) 5 MG tablet TAKE ONE-HALF TABLET BY MOUTH AT BEDTIME AS NEEDED FOR SLEEP 15 tablet 3   No current facility-administered medications for this visit.    Allergies  Allergen Reactions  . Flexeril [Cyclobenzaprine Hcl] Other (See Comments)       caused altered mental status    Social History   Social History  . Marital Status: Married    Spouse Name: N/A  . Number of Children: 6  . Years of Education: N/A   Occupational History  . Retired    Social History Main Topics  . Smoking status: Former Smoker -- 1.00 packs/day for 55 years    Types: Cigarettes    Quit date: 03/20/2008  . Smokeless tobacco: Never Used  . Alcohol Use: No     Comment: hx alcohol abuse -- quit in 1980's   . Drug Use: No  . Sexual Activity: Not on file   Other Topics Concern  . Not on file   Social History Narrative   Married. 6 kids. Wife Joycelyn Schmid also a patient of Dr. Ronney Lion.       Retired.      Review of Systems: General: negative for chills, fever, night sweats or weight changes.  Cardiovascular: negative for chest pain, dyspnea on exertion, edema, orthopnea, palpitations, paroxysmal nocturnal  dyspnea or shortness of breath Dermatological: negative for rash Respiratory: negative for cough or wheezing Urologic: negative for hematuria Abdominal: negative for nausea, vomiting, diarrhea, bright red blood per rectum, melena, or hematemesis Neurologic: negative for visual changes, syncope, or dizziness All other systems reviewed and are otherwise negative except as noted above.    Blood pressure 118/70, pulse 56, height 5\' 11"  (1.803 m), weight 165 lb (74.844 kg).  General appearance: alert and no distress Neck: no adenopathy, no carotid bruit, no JVD, supple,  symmetrical, trachea midline and thyroid not enlarged, symmetric, no tenderness/mass/nodules Lungs: clear to auscultation bilaterally Heart: irregularly irregular rhythm Extremities: extremities normal, atraumatic, no cyanosis or edema  EKG atrial fibrillation with a ventricular response of 56 and right bundle-branch block with low limb voltage. I personally reviewed this EKG  ASSESSMENT AND PLAN:   HYPERLIPIDEMIA History of hyperlipidemia not on statin therapy followed by his PCP  Essential hypertension History of hypertension on metoprolol and Maxide blood pressure measures 118/70. Continue current meds at current dosing  Atrial fibrillation History of chronic A. Fib rate controlled on Coumadin anticoagulation      Lorretta Harp MD [facility], [facility] 05/21/2015 9:57 AM

## 2015-05-21 NOTE — Patient Instructions (Signed)

## 2015-05-21 NOTE — Assessment & Plan Note (Signed)
History of chronic A. Fib rate controlled on Coumadin anticoagulation. 

## 2015-05-21 NOTE — Assessment & Plan Note (Signed)
History of hyperlipidemia not on statin therapy followed by his PCP 

## 2015-05-21 NOTE — Assessment & Plan Note (Signed)
History of hypertension on metoprolol and Maxide blood pressure measures 118/70. Continue current meds at current dosing

## 2015-06-03 ENCOUNTER — Other Ambulatory Visit: Payer: Self-pay | Admitting: General Practice

## 2015-06-03 ENCOUNTER — Ambulatory Visit (INDEPENDENT_AMBULATORY_CARE_PROVIDER_SITE_OTHER): Payer: Medicare Other | Admitting: General Practice

## 2015-06-03 DIAGNOSIS — Z5181 Encounter for therapeutic drug level monitoring: Secondary | ICD-10-CM | POA: Diagnosis not present

## 2015-06-03 DIAGNOSIS — I4891 Unspecified atrial fibrillation: Secondary | ICD-10-CM | POA: Diagnosis not present

## 2015-06-03 LAB — POCT INR: INR: 2

## 2015-06-03 MED ORDER — WARFARIN SODIUM 2.5 MG PO TABS: ORAL_TABLET | ORAL | Status: DC

## 2015-06-03 NOTE — Progress Notes (Signed)
I agree with this plan.

## 2015-06-03 NOTE — Progress Notes (Signed)
Pre visit review using our clinic review tool, if applicable. No additional management support is needed unless otherwise documented below in the visit note. 

## 2015-06-24 ENCOUNTER — Ambulatory Visit (INDEPENDENT_AMBULATORY_CARE_PROVIDER_SITE_OTHER): Payer: Medicare Other | Admitting: Family Medicine

## 2015-06-24 ENCOUNTER — Ambulatory Visit: Payer: Medicare Other | Admitting: Family Medicine

## 2015-06-24 ENCOUNTER — Encounter: Payer: Self-pay | Admitting: Family Medicine

## 2015-06-24 VITALS — BP 114/70 | HR 68 | Temp 97.9°F | Wt 154.0 lb

## 2015-06-24 DIAGNOSIS — R238 Other skin changes: Secondary | ICD-10-CM | POA: Diagnosis not present

## 2015-06-24 NOTE — Patient Instructions (Signed)
Vesicle (fluid filled sac often caused by some injury)  Unclear cause- ? Heating pad  We drained this - fluid did not appear infected  Return if- expanding redness, worsening pain, fevers or sick overall  Try to avoid anything irritating the area- nice loose fitting clothes

## 2015-06-24 NOTE — Progress Notes (Signed)
[name], MD  Subjective:  William Mahoney is a 82 y.o. year old very pleasant male patient who presents for/with See problem oriented charting ROS- no fever, chills, nausea or vomiting. No expanding redness. No new medications. Does not feel ill overall.  Past Medical History-  Patient Active Problem List   Diagnosis Date Noted  . Atrial fibrillation ([place]) 12/07/2008    Priority: High  . Insomnia 10/19/2014    Priority: Medium  . Gout 01/09/2014    Priority: Medium  . 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 Former smoker 06/23/2007    Priority: Medium  . HYPERLIPIDEMIA 02/09/2007    Priority: Medium  . Essential hypertension 12/07/2006    Priority: Medium  . COPD 12/07/2006    Priority: Medium  . BPH (benign prostatic hyperplasia) 12/07/2006    Priority: Medium  . Allergic rhinitis 08/15/2013    Priority: Low  . 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 Inguinal hernia 03/21/2010    Priority: Low  . BASAL CELL CARCINOMA SKIN LOWER LIMB INCL HIP 03/21/2010    Priority: Low  . Actinic keratosis 12/07/2008    Priority: Low  . OSTEOARTHRITIS 02/09/2007    Priority: Low  . PEPTIC ULCER DISEASE 12/07/2006    Priority: Low  . URINARY INCONTINENCE 12/07/2006    Priority: Low  . PANCREATITIS, HX OF 12/07/2006    Priority: Low  . Encounter for therapeutic drug monitoring 08/27/2014    Medications- reviewed and updated Current Outpatient Prescriptions  Medication Sig Dispense Refill  . allopurinol (ZYLOPRIM) 300 MG tablet Take 0.5 tablets (150 mg total) by mouth every evening. 90 tablet 2  . colchicine 0.6 MG tablet Take 0.6 mg by mouth 2 (two) times daily as needed (gout).    . fish oil-omega-3 fatty acids 1000 MG capsule Take 1 g by mouth daily.    [name] loperamide (IMODIUM A-D) 2 MG tablet Take 2 mg by mouth 4 (four) times daily as needed for diarrhea or loose stools.    . metoprolol (LOPRESSOR) 50 MG tablet  TAKE ONE-HALF TABLET BY MOUTH TWICE DAILY 30 tablet 5  . Multiple Vitamin (MULTIVITAMIN) tablet Take 1 tablet by mouth daily.    . tamsulosin (FLOMAX) 0.4 MG CAPS capsule Take 0.4 mg by mouth.    . triamterene-hydrochlorothiazide (MAXZIDE-25) 37.5-25 MG tablet TAKE ONE-HALF TABLET BY MOUTH ONCE DAILY 30 tablet 5  . warfarin (COUMADIN) 2.5 MG tablet Take as directed by anticoagulation clinic 120 tablet 1  . acetaminophen-codeine (TYLENOL #3) 300-30 MG per tablet Take 1 tablet by mouth every 4 (four) hours as needed. (Patient not taking: Reported on 06/24/2015) 20 tablet 0  . zolpidem (AMBIEN) 5 MG tablet TAKE ONE-HALF TABLET BY MOUTH AT BEDTIME AS NEEDED FOR SLEEP (Patient not taking: Reported on 06/24/2015) 15 tablet 3   No current facility-administered medications for this visit.    Objective: BP 114/70 mmHg  Pulse 68  Temp(Src) 97.9 F (36.6 C)  Wt 154 lb (69.854 kg) Gen: NAD, resting comfortably CV: Irregularly irregular no murmurs rubs or gallops Lungs: CTAB no crackles, wheeze, rhonchi MSK/groin: No hernias felt. Near prior inguinal hernia surgical scar there is a 3-4 cm x 1 cm vesicular lesion filled with clear fluid. This was later punctured and clear to yellowish fluid drained. Underneath the vesicle there was some mild erythema and bruising. Pre-drainage picture below Ext: no edema Skin: warm, dry, no rash    Assessment/Plan:  Vesicular lesion  S: Patient states he noted this area about a week to 10 days ago. He has had intermittent pain with it most pronounced about to 3 days ago and is now subsided once again. Sometimes it seems more erythematous and seems to be spreading at other times it seems to be shrinking. Over the last 2 days it seems to be improving. He is not tried any medicine. He does not know of an obvious trigger. Although he was using a heating pad on his back and thinks that the pain had could've gotten into the groin Celexa potential burn. Otherwise denies injury  or trauma A/P: We discussed this is not likely a dangerous lesion. Erythema is confined to the vesicle itself and did not suspect cellulitis or skin infection. Suspect there was some sort of trauma or injury such as a burning. We did ultimately decide to drain the lesion given the pressure from the fluid seems to make the vesicle stand out more and get irritated easier. Advised loosefitting clothes. Follow-up as needed and with return precautions given. Discussed considering dermatology if worsens but does not appear infectious or last pass another 2-3 weeks or if new lesions found  The duration of face-to-face time during this visit was 15 minutes. Greater than 50% of this time was spent in counseling, explanation of diagnosis, planning of further management, and/or coordination of care.

## 2015-06-27 ENCOUNTER — Other Ambulatory Visit: Payer: Self-pay | Admitting: Family Medicine

## 2015-07-03 ENCOUNTER — Telehealth: Payer: Self-pay | Admitting: Family Medicine

## 2015-07-03 NOTE — Telephone Encounter (Signed)
Pt was seen on 06-24-15 and now the lesion has turned black. Pt said he was told to callback if any problems. Please advise

## 2015-07-03 NOTE — Telephone Encounter (Signed)
Per notes on AVS pt should return if symtoms return or worsen. Schedule OV please.

## 2015-07-03 NOTE — Telephone Encounter (Signed)
Yes ma'm :)

## 2015-07-03 NOTE — Telephone Encounter (Signed)
Pt has been sch

## 2015-07-03 NOTE — Telephone Encounter (Signed)
Can I use sda slot tomorrow?

## 2015-07-04 ENCOUNTER — Encounter: Payer: Self-pay | Admitting: Family Medicine

## 2015-07-04 ENCOUNTER — Ambulatory Visit (INDEPENDENT_AMBULATORY_CARE_PROVIDER_SITE_OTHER): Payer: Medicare Other | Admitting: Family Medicine

## 2015-07-04 VITALS — BP 130/80 | HR 66 | Temp 97.6°F | Wt 164.0 lb

## 2015-07-04 DIAGNOSIS — L989 Disorder of the skin and subcutaneous tissue, unspecified: Secondary | ICD-10-CM

## 2015-07-04 NOTE — Patient Instructions (Signed)
We will call you within a week about your referral to dermatology. If you do not hear within 2 weeks, give [place] a call.   If you have expanding redness or blackness on either of two areas- we need to reevaluate

## 2015-07-04 NOTE — Progress Notes (Signed)
William Reddish, MD  Subjective:  William Mahoney is a 82 y.o. year old very pleasant male patient who presents for/with See problem oriented charting ROS- -not ill appearing, no fever/chills. No new medications. Not immunocompromised. No mucus membrane involvement.   Past Medical History-  Patient Active Problem List   Diagnosis Date Noted  . Atrial fibrillation ([place]) 12/07/2008    Priority: High  . Insomnia 10/19/2014    Priority: Medium  . Gout 01/09/2014    Priority: Medium  . 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 Former smoker 06/23/2007    Priority: Medium  . HYPERLIPIDEMIA 02/09/2007    Priority: Medium  . Essential hypertension 12/07/2006    Priority: Medium  . COPD 12/07/2006    Priority: Medium  . BPH (benign prostatic hyperplasia) 12/07/2006    Priority: Medium  . Allergic rhinitis 08/15/2013    Priority: Low  . 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 Inguinal hernia 03/21/2010    Priority: Low  . BASAL CELL CARCINOMA SKIN LOWER LIMB INCL HIP 03/21/2010    Priority: Low  . Actinic keratosis 12/07/2008    Priority: Low  . OSTEOARTHRITIS 02/09/2007    Priority: Low  . PEPTIC ULCER DISEASE 12/07/2006    Priority: Low  . URINARY INCONTINENCE 12/07/2006    Priority: Low  . PANCREATITIS, HX OF 12/07/2006    Priority: Low  . Encounter for therapeutic drug monitoring 08/27/2014    Medications- reviewed and updated Current Outpatient Prescriptions  Medication Sig Dispense Refill  . allopurinol (ZYLOPRIM) 300 MG tablet Take 0.5 tablets (150 mg total) by mouth every evening. 90 tablet 2  . colchicine 0.6 MG tablet Take 0.6 mg by mouth 2 (two) times daily as needed (gout).    . fish oil-omega-3 fatty acids 1000 MG capsule Take 1 g by mouth daily.    [name] loperamide (IMODIUM A-D) 2 MG tablet Take 2 mg by mouth 4 (four) times daily as needed for diarrhea or loose stools.    . metoprolol (LOPRESSOR) 50 MG  tablet TAKE ONE-HALF TABLET BY MOUTH TWICE DAILY 30 tablet 5  . Multiple Vitamin (MULTIVITAMIN) tablet Take 1 tablet by mouth daily.    . tamsulosin (FLOMAX) 0.4 MG CAPS capsule Take 0.4 mg by mouth.    . triamterene-hydrochlorothiazide (MAXZIDE-25) 37.5-25 MG tablet TAKE ONE-HALF TABLET BY MOUTH ONCE DAILY 30 tablet 5  . warfarin (COUMADIN) 2.5 MG tablet Take as directed by anticoagulation clinic 120 tablet 1  . warfarin (COUMADIN) 2.5 MG tablet TAKE AS DIRECTED BY ANTICOAGULATION CLINIC 40 tablet 2  . acetaminophen-codeine (TYLENOL #3) 300-30 MG per tablet Take 1 tablet by mouth every 4 (four) hours as needed. (Patient not taking: Reported on 06/24/2015) 20 tablet 0  . zolpidem (AMBIEN) 5 MG tablet TAKE ONE-HALF TABLET BY MOUTH AT BEDTIME AS NEEDED FOR SLEEP (Patient not taking: Reported on 06/24/2015) 15 tablet 3   No current facility-administered medications for this visit.    Objective: BP 130/80 mmHg  Pulse 66  Temp(Src) 97.6 F (36.4 C)  Wt 164 lb (74.39 kg)  SpO2 97% Gen: NAD, resting comfortably CV: RRR no murmurs rubs or gallops Lungs: CTAB no crackles, wheeze, rhonchi Abdomen: soft/nontender/nondistended/normal bowel sounds. No rebound or guarding.  No pain in groin in area of picture below Ext: no edema Skin: warm, dry, black linear lesion in area of prior vesicle        Assessment/Plan:  Skin lesion -  Plan: Ambulatory referral to Dermatology S: Patient seen 10 days ago for a vesicular lesion along his prior inguinal hernia scar. He requested drainage and unddryling this was some erythema and bruising. He noted over a few days that he developed a black lesion in that are and children were worried about "necrosis" and also developed a lesion on left upper face that family was worried could be related. Patient has no pain or concern about lesion but family is very worried about them.  A/P: Area on his face looks like an abrasion and bruising. Unclear why prior vesicle  changed to darker color but may just be part of healing process- does not seem infected. Due to family concern, patient would like dermatology referral for another opinion and referred at this time.   Return precautions advised.   Orders Placed This Encounter  Procedures  . Ambulatory referral to Dermatology    Referral Priority:  Routine    Referral Type:  Consultation    Referral Reason:  Specialty Services Required    Requested Specialty:  Dermatology    Number of Visits Requested:  1   The duration of face-to-face time during this visit was 15 minutes. Greater than 50% of this time was spent in counseling, explanation of diagnosis, planning of further management, and/or coordination of care.

## 2015-07-15 ENCOUNTER — Ambulatory Visit (INDEPENDENT_AMBULATORY_CARE_PROVIDER_SITE_OTHER): Payer: Medicare Other | Admitting: General Practice

## 2015-07-15 DIAGNOSIS — I4891 Unspecified atrial fibrillation: Secondary | ICD-10-CM

## 2015-07-15 DIAGNOSIS — Z7901 Long term (current) use of anticoagulants: Secondary | ICD-10-CM

## 2015-07-15 DIAGNOSIS — Z5181 Encounter for therapeutic drug level monitoring: Secondary | ICD-10-CM | POA: Diagnosis not present

## 2015-07-15 DIAGNOSIS — I482 Chronic atrial fibrillation, unspecified: Secondary | ICD-10-CM

## 2015-07-15 LAB — POCT INR: INR: 2.7

## 2015-07-15 NOTE — Progress Notes (Signed)
Pre visit review using our clinic review tool, if applicable. No additional management support is needed unless otherwise documented below in the visit note. 

## 2015-07-29 ENCOUNTER — Other Ambulatory Visit: Payer: Self-pay | Admitting: Family Medicine

## 2015-08-26 ENCOUNTER — Ambulatory Visit (INDEPENDENT_AMBULATORY_CARE_PROVIDER_SITE_OTHER): Payer: Medicare Other | Admitting: General Practice

## 2015-08-26 DIAGNOSIS — I482 Chronic atrial fibrillation, unspecified: Secondary | ICD-10-CM

## 2015-08-26 DIAGNOSIS — Z7901 Long term (current) use of anticoagulants: Secondary | ICD-10-CM | POA: Diagnosis not present

## 2015-08-26 DIAGNOSIS — I4891 Unspecified atrial fibrillation: Secondary | ICD-10-CM

## 2015-08-26 DIAGNOSIS — Z5181 Encounter for therapeutic drug level monitoring: Secondary | ICD-10-CM | POA: Diagnosis not present

## 2015-08-26 LAB — POCT INR: INR: 2.7

## 2015-08-26 NOTE — Progress Notes (Signed)
Pre visit review using our clinic review tool, if applicable. No additional management support is needed unless otherwise documented below in the visit note. 

## 2015-10-07 ENCOUNTER — Ambulatory Visit (INDEPENDENT_AMBULATORY_CARE_PROVIDER_SITE_OTHER): Payer: Medicare Other | Admitting: General Practice

## 2015-10-07 DIAGNOSIS — Z5181 Encounter for therapeutic drug level monitoring: Secondary | ICD-10-CM

## 2015-10-07 DIAGNOSIS — I4891 Unspecified atrial fibrillation: Secondary | ICD-10-CM | POA: Diagnosis not present

## 2015-10-07 DIAGNOSIS — I482 Chronic atrial fibrillation, unspecified: Secondary | ICD-10-CM

## 2015-10-07 DIAGNOSIS — Z7901 Long term (current) use of anticoagulants: Secondary | ICD-10-CM

## 2015-10-07 LAB — POCT INR: INR: 2.6

## 2015-10-07 NOTE — Progress Notes (Signed)
Pre visit review using our clinic review tool, if applicable. No additional management support is needed unless otherwise documented below in the visit note. 

## 2015-11-18 ENCOUNTER — Ambulatory Visit: Payer: Medicare Other

## 2015-11-25 ENCOUNTER — Ambulatory Visit: Payer: Medicare Other

## 2015-12-02 ENCOUNTER — Ambulatory Visit (INDEPENDENT_AMBULATORY_CARE_PROVIDER_SITE_OTHER): Payer: Medicare Other | Admitting: General Practice

## 2015-12-02 DIAGNOSIS — Z5181 Encounter for therapeutic drug level monitoring: Secondary | ICD-10-CM

## 2015-12-02 DIAGNOSIS — Z7901 Long term (current) use of anticoagulants: Secondary | ICD-10-CM | POA: Diagnosis not present

## 2015-12-02 DIAGNOSIS — I482 Chronic atrial fibrillation, unspecified: Secondary | ICD-10-CM

## 2015-12-02 DIAGNOSIS — I4891 Unspecified atrial fibrillation: Secondary | ICD-10-CM

## 2015-12-02 LAB — POCT INR: INR: 2.7

## 2015-12-03 IMAGING — US US SCROTUM
1 series · 13 of 25 positions shown · non-contrast
Comparison: None.

CLINICAL DATA: Left scrotal region pain for 2 days.

EXAM:
ULTRASOUND OF SCROTUM
TECHNIQUE: Complete ultrasound examination of the testicles, epididymis, and
other scrotal structures was performed.

[Series 1: us scrotum · 0.06mm/px · 70 acquisitions, 13 frames shown]
[im 1/70]
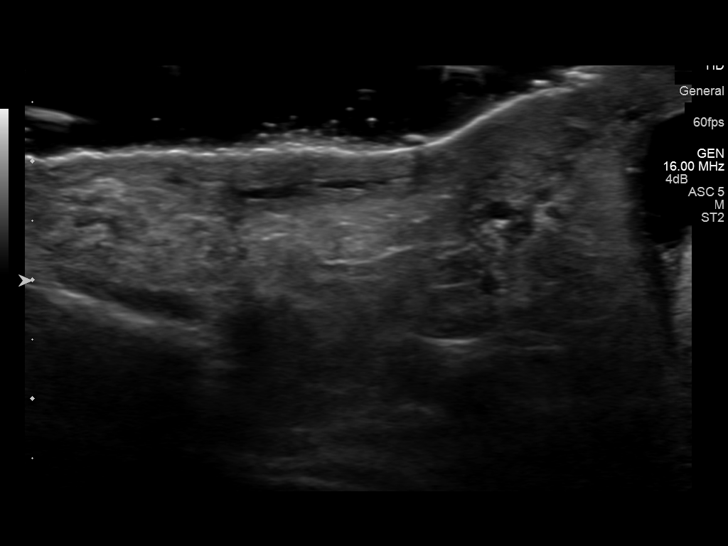
[im 6/70]
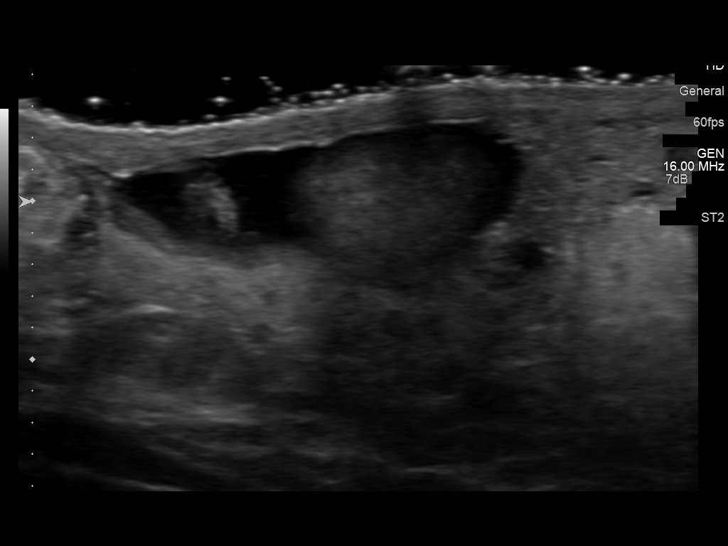
[im 12/70]
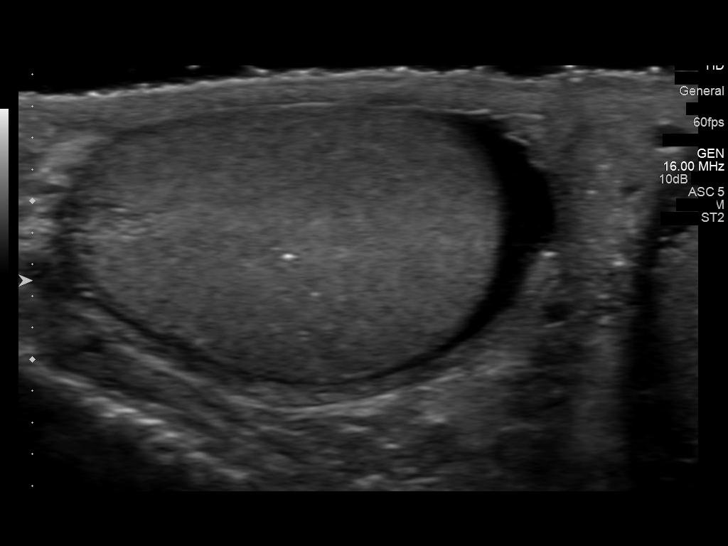
[im 18/70]
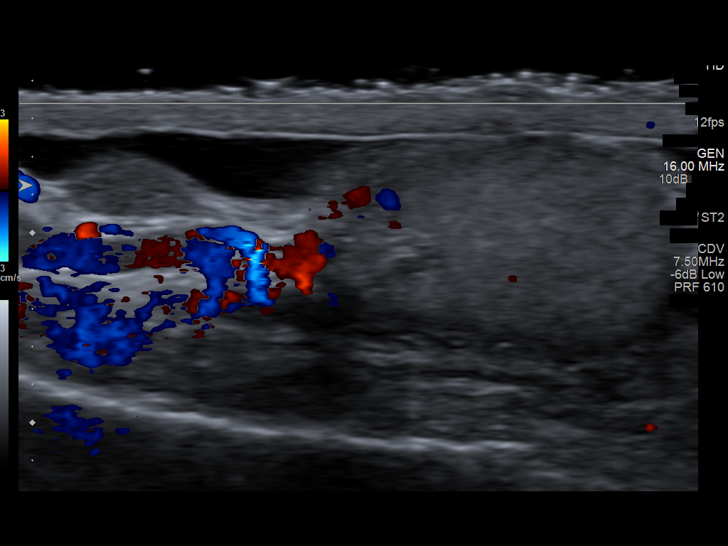
[im 24/70]
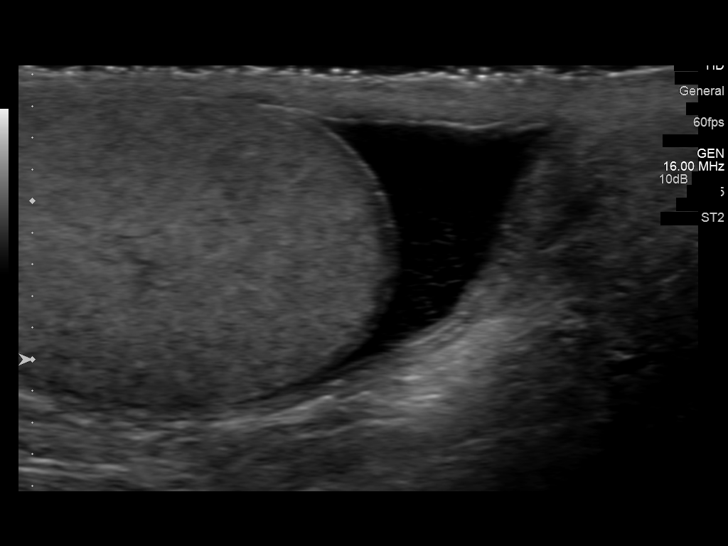
[im 29/70]
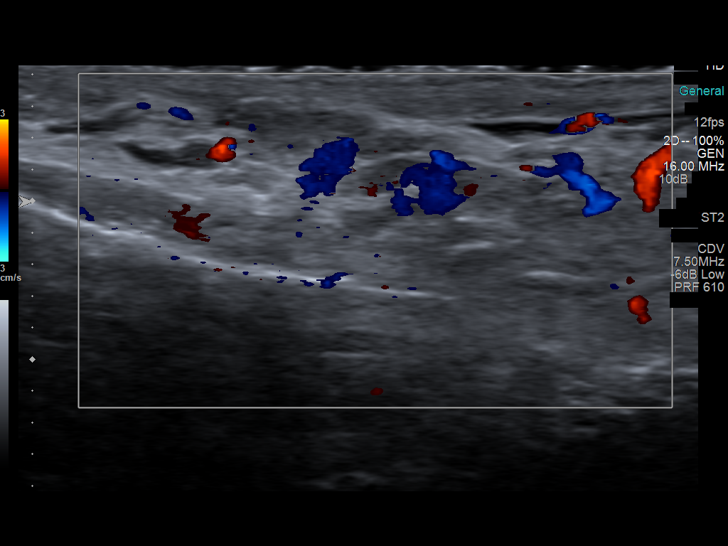
[im 35/70]
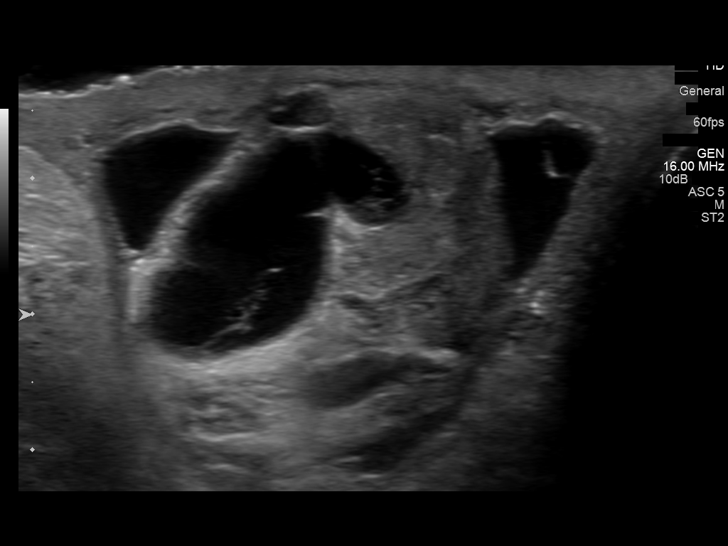
[im 41/70]
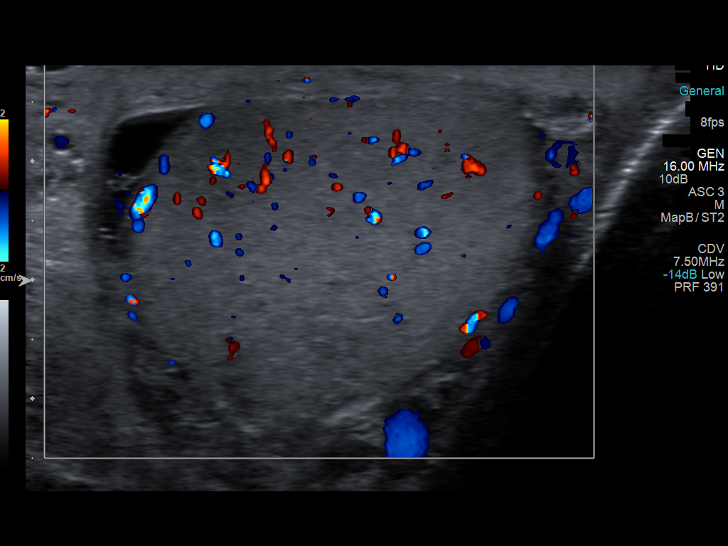
[im 47/70]
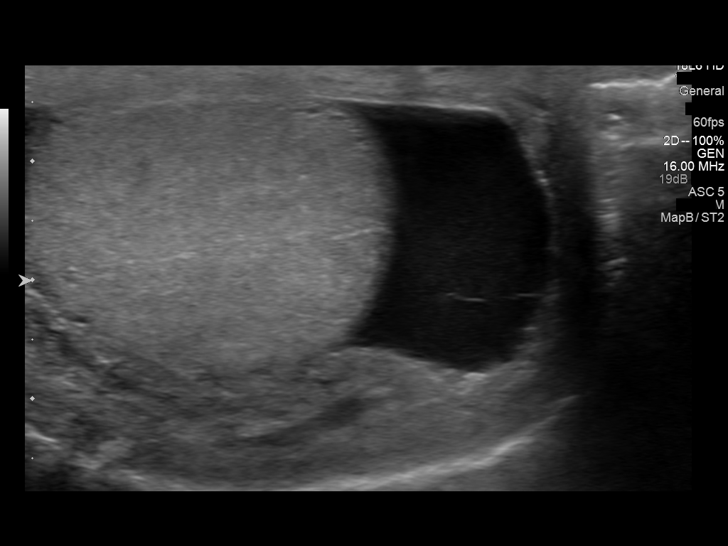
[im 52/70]
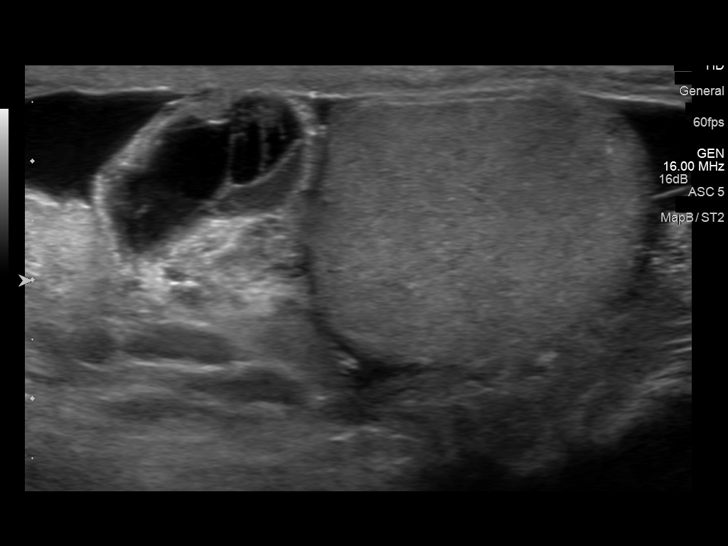
[im 58/70]
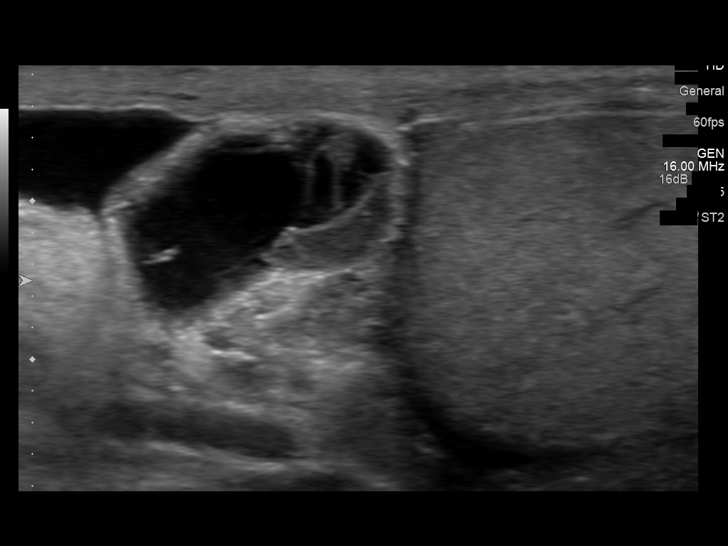
[im 64/70]
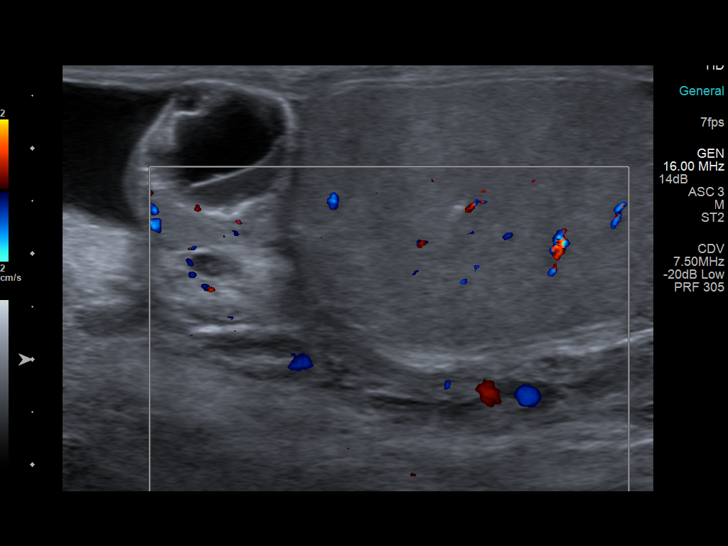
[im 70/70]
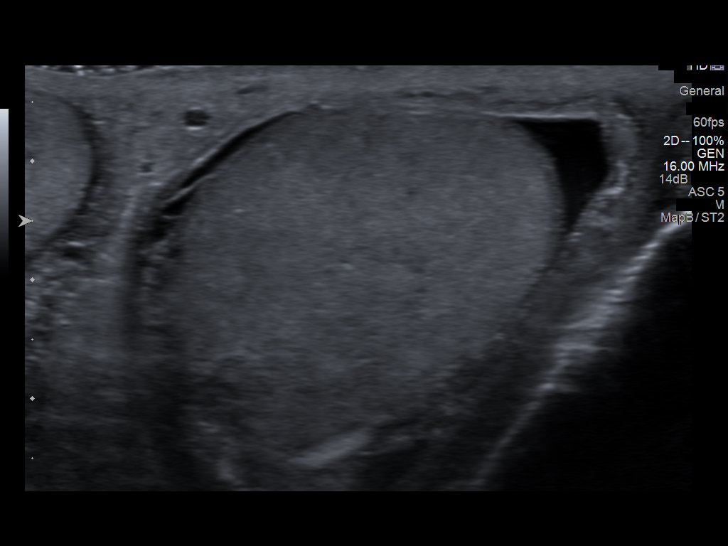

[13 of 25 positions shown; findings below may reference images not displayed]

FINDINGS: Right testicle

Measurements: 3.9 cm x 1.8 cm x 3.1 cm. No testicular mass. Several
small testicular calcifications.

Left testicle

Measurements: 4.1 cm x 2.8 cm x 3.6 cm. No testicular mass. Several
small testicular calcifications. Mildly heterogeneous echogenicity.
Mild increased blood flow when compared to the right testicle.

Right epididymis:  Normal in size and appearance.

Left epididymis: Septated cystic area enlarges the epididymal head.
This measures 18 mm x 10 mm x 21 mm.

Hydrocele:  Moderate left and minimal right hydroceles.

Varicocele:  None visualized.
IMPRESSION: 1. No testicular mass. Mild testicular microlithiasis. Current
literature suggests that testicular microlithiasis is not a
significant independent risk factor for development of testicular
carcinoma, and that follow up imaging is not warranted in the
absence of other risk factors. Monthly testicular self-examination
and annual physical exams are considered appropriate surveillance.
If patient has other risk factors for testicular carcinoma, then
referral to Urology should be considered. (Reference: Hannelore, et
al.: A 5-Year Follow up Study of Asymptomatic Men with Testicular
Microlithiasis. J Urol 4664; [id number].)
2. Mildly heterogeneous echogenicity of the left testicle. Mild
increased left testicular blood flow. Complex septated cystic area
enlarges the left epididymal head. Findings support epididymitis
orchitis on the left.
3. Moderate left and minimal right hydroceles.
4. No other abnormalities.

## 2016-01-13 ENCOUNTER — Ambulatory Visit (INDEPENDENT_AMBULATORY_CARE_PROVIDER_SITE_OTHER): Payer: Medicare Other | Admitting: General Practice

## 2016-01-13 ENCOUNTER — Ambulatory Visit: Payer: Medicare Other

## 2016-01-13 DIAGNOSIS — I482 Chronic atrial fibrillation, unspecified: Secondary | ICD-10-CM

## 2016-01-13 DIAGNOSIS — Z7901 Long term (current) use of anticoagulants: Secondary | ICD-10-CM

## 2016-01-13 DIAGNOSIS — Z5181 Encounter for therapeutic drug level monitoring: Secondary | ICD-10-CM | POA: Diagnosis not present

## 2016-01-13 LAB — POCT INR: INR: 2

## 2016-01-21 ENCOUNTER — Ambulatory Visit: Payer: Medicare Other

## 2016-01-27 ENCOUNTER — Other Ambulatory Visit: Payer: Self-pay | Admitting: Family Medicine

## 2016-02-24 ENCOUNTER — Ambulatory Visit (INDEPENDENT_AMBULATORY_CARE_PROVIDER_SITE_OTHER): Payer: Medicare Other | Admitting: General Practice

## 2016-02-24 ENCOUNTER — Ambulatory Visit: Payer: Medicare Other | Admitting: Family Medicine

## 2016-02-24 DIAGNOSIS — Z7901 Long term (current) use of anticoagulants: Secondary | ICD-10-CM

## 2016-02-24 DIAGNOSIS — Z5181 Encounter for therapeutic drug level monitoring: Secondary | ICD-10-CM

## 2016-02-24 DIAGNOSIS — I482 Chronic atrial fibrillation, unspecified: Secondary | ICD-10-CM

## 2016-02-24 LAB — POCT INR: INR: 1.9

## 2016-02-24 NOTE — Patient Instructions (Signed)
Pre visit review using our clinic review tool, if applicable. No additional management support is needed unless otherwise documented below in the visit note. 

## 2016-03-23 ENCOUNTER — Ambulatory Visit (INDEPENDENT_AMBULATORY_CARE_PROVIDER_SITE_OTHER): Payer: Medicare Other | Admitting: Family Medicine

## 2016-03-23 ENCOUNTER — Encounter: Payer: Self-pay | Admitting: Family Medicine

## 2016-03-23 ENCOUNTER — Ambulatory Visit (INDEPENDENT_AMBULATORY_CARE_PROVIDER_SITE_OTHER): Payer: Medicare Other | Admitting: General Practice

## 2016-03-23 ENCOUNTER — Telehealth: Payer: Self-pay | Admitting: Family Medicine

## 2016-03-23 VITALS — BP 148/86 | HR 81 | Temp 97.6°F | Ht 71.0 in | Wt 165.2 lb

## 2016-03-23 DIAGNOSIS — J3089 Other allergic rhinitis: Secondary | ICD-10-CM | POA: Diagnosis not present

## 2016-03-23 DIAGNOSIS — T148XXA Other injury of unspecified body region, initial encounter: Secondary | ICD-10-CM | POA: Diagnosis not present

## 2016-03-23 DIAGNOSIS — I482 Chronic atrial fibrillation, unspecified: Secondary | ICD-10-CM

## 2016-03-23 DIAGNOSIS — Z5181 Encounter for therapeutic drug level monitoring: Secondary | ICD-10-CM

## 2016-03-23 DIAGNOSIS — Z7901 Long term (current) use of anticoagulants: Secondary | ICD-10-CM

## 2016-03-23 DIAGNOSIS — I4891 Unspecified atrial fibrillation: Secondary | ICD-10-CM

## 2016-03-23 DIAGNOSIS — I10 Essential (primary) hypertension: Secondary | ICD-10-CM | POA: Diagnosis not present

## 2016-03-23 LAB — POCT INR: INR: 2

## 2016-03-23 MED ORDER — FLUTICASONE PROPIONATE 50 MCG/ACT NA SUSP: 2.0000 | Freq: Every day | NASAL | 6 refills | Status: DC

## 2016-03-23 NOTE — Progress Notes (Signed)
Pre visit review using our clinic review tool, if applicable. No additional management support is needed unless otherwise documented below in the visit note. 

## 2016-03-23 NOTE — Patient Instructions (Addendum)
With runny nose and watery eyes- suspect allergies- let's try flonase twice a day for at least 2-3 week trial.   I bet you were rubbing your eye at night- hopefully if we calm allergies down you will be less likely to do this. Glad getting better today  If new (such as headache or blurry vision) or worsening symptoms

## 2016-03-23 NOTE — Progress Notes (Signed)
Subjective:  William Mahoney is a 82 y.o. year old very pleasant male patient who presents for/with See problem oriented charting ROS- no blurry vision, headache, bruising behind ear, trouble hearing   Past Medical History-  Patient Active Problem List   Diagnosis Date Noted  . Atrial fibrillation ([place]) 12/07/2008    Priority: High  . Insomnia 10/19/2014    Priority: Medium  . Gout 01/09/2014    Priority: Medium  . 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 Former smoker 06/23/2007    Priority: Medium  . HYPERLIPIDEMIA 02/09/2007    Priority: Medium  . Essential hypertension 12/07/2006    Priority: Medium  . COPD 12/07/2006    Priority: Medium  . BPH (benign prostatic hyperplasia) 12/07/2006    Priority: Medium  . Allergic rhinitis 08/15/2013    Priority: Low  . 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 Inguinal hernia 03/21/2010    Priority: Low  . BASAL CELL CARCINOMA SKIN LOWER LIMB INCL HIP 03/21/2010    Priority: Low  . Actinic keratosis 12/07/2008    Priority: Low  . OSTEOARTHRITIS 02/09/2007    Priority: Low  . PEPTIC ULCER DISEASE 12/07/2006    Priority: Low  . URINARY INCONTINENCE 12/07/2006    Priority: Low  . PANCREATITIS, HX OF 12/07/2006    Priority: Low  . Encounter for therapeutic drug monitoring 08/27/2014    Medications- reviewed and updated Current Outpatient Prescriptions  Medication Sig Dispense Refill  . acetaminophen-codeine (TYLENOL #3) 300-30 MG per tablet Take 1 tablet by mouth every 4 (four) hours as needed. 20 tablet 0  . allopurinol (ZYLOPRIM) 300 MG tablet Take 0.5 tablets (150 mg total) by mouth every evening. 90 tablet 2  . colchicine 0.6 MG tablet Take 0.6 mg by mouth 2 (two) times daily as needed (gout).    . fish oil-omega-3 fatty acids 1000 MG capsule Take 1 g by mouth daily.    [name] loperamide (IMODIUM A-D) 2 MG tablet Take 2 mg by mouth 4 (four) times daily as needed for  diarrhea or loose stools.    . metoprolol (LOPRESSOR) 50 MG tablet TAKE ONE-HALF TABLET BY MOUTH TWICE DAILY 90 tablet 1  . Multiple Vitamin (MULTIVITAMIN) tablet Take 1 tablet by mouth daily.    . tamsulosin (FLOMAX) 0.4 MG CAPS capsule Take 0.4 mg by mouth.    . triamterene-hydrochlorothiazide (MAXZIDE-25) 37.5-25 MG tablet TAKE ONE-HALF TABLET BY MOUTH ONCE DAILY 90 tablet 1  . warfarin (COUMADIN) 2.5 MG tablet Take as directed by anticoagulation clinic 120 tablet 1  . warfarin (COUMADIN) 2.5 MG tablet TAKE AS DIRECTED BY ANTICOAGULATION CLINIC 40 tablet 2  . zolpidem (AMBIEN) 5 MG tablet TAKE ONE-HALF TABLET BY MOUTH AT BEDTIME AS NEEDED FOR SLEEP 15 tablet 3  . fluticasone (FLONASE) 50 MCG/ACT nasal spray Place 2 sprays into both nostrils daily. 16 g 6   No current facility-administered medications for this visit.     Objective: BP (!) 148/86 (BP Location: Left Arm, Patient Position: Sitting, Cuff Size: Large)   Pulse 81   Temp 97.6 F (36.4 C) (Oral)   Ht 5\' 11"  (1.803 m)   Wt 165 lb 3.2 oz (74.9 kg)   SpO2 94%   BMI 23.04 kg/m  Gen: NAD, resting comfortably Slight bruising below right eye, PERRLA, EOMI without pain Rhinorrhea noted, mild watering bilateral eyes, nares minimal erythema CV: irregularly irregular, no murmurs rubs or gallops Lungs: CTAB  no crackles, wheeze, rhonchi  Ext: no edema  Assessment/Plan:  Allergic rhinitis Bruising under right eye S: for several weeks has had watery, itchy eyes as well as runny nose. Slightly worse since his wife has been sick (she is finally improving). Last Friday woke up and had some bruising under right eye, improved until Sunday morning when worsened again now looking better today. He has worse baseline vision in right eye- as long as not watering- no increase in his vision issues at this point A/P: patient presented with concern of bruising under eye. Discussed allergic rhinitis may be cause- he wants to itch eyes in day but does  not- may be rubbing at night and with his coumadin- easier bruising. Doubt serious pathology at play here. Trial 3 weeks of flonase- note slight errors in avs were corrected by hand.    BP noted elevated today- has been controlled last few checks- monitor only for now BP Readings from Last 3 Encounters:  03/23/16 (!) 148/86  07/04/15 130/80  06/24/15 114/70   Meds ordered this encounter  Medications  . fluticasone (FLONASE) 50 MCG/ACT nasal spray    Sig: Place 2 sprays into both nostrils daily.    Dispense:  16 g    Refill:  6    Return precautions advised.  [name], MD

## 2016-03-23 NOTE — Telephone Encounter (Signed)
Pt has appt this am for his coum.  Pt states he has a black eye that comes and goes. Would like Dr Yong Channel to take a look at it, but no appts. Please advise.

## 2016-03-23 NOTE — Telephone Encounter (Signed)
William Mahoney, So you saying that Dr. Yong Channel has no openings?  Could you schedule him with someone else?

## 2016-03-23 NOTE — Telephone Encounter (Signed)
Working in right now

## 2016-03-23 NOTE — Telephone Encounter (Signed)
No, and he refused to see anyone else until I asked Dr Yong Channel to see him.

## 2016-04-27 ENCOUNTER — Ambulatory Visit (INDEPENDENT_AMBULATORY_CARE_PROVIDER_SITE_OTHER): Payer: Medicare Other | Admitting: General Practice

## 2016-04-27 DIAGNOSIS — Z7901 Long term (current) use of anticoagulants: Secondary | ICD-10-CM | POA: Diagnosis not present

## 2016-04-27 DIAGNOSIS — Z5181 Encounter for therapeutic drug level monitoring: Secondary | ICD-10-CM

## 2016-04-27 DIAGNOSIS — I482 Chronic atrial fibrillation, unspecified: Secondary | ICD-10-CM

## 2016-04-27 DIAGNOSIS — I4891 Unspecified atrial fibrillation: Secondary | ICD-10-CM

## 2016-04-27 LAB — POCT INR: INR: 2.5

## 2016-04-27 NOTE — Patient Instructions (Signed)
Pre visit review using our clinic review tool, if applicable. No additional management support is needed unless otherwise documented below in the visit note. 

## 2016-05-25 ENCOUNTER — Other Ambulatory Visit: Payer: Self-pay | Admitting: Family Medicine

## 2016-05-26 ENCOUNTER — Other Ambulatory Visit: Payer: Self-pay | Admitting: Family Medicine

## 2016-06-01 ENCOUNTER — Ambulatory Visit (INDEPENDENT_AMBULATORY_CARE_PROVIDER_SITE_OTHER): Payer: Medicare Other | Admitting: General Practice

## 2016-06-01 DIAGNOSIS — Z5181 Encounter for therapeutic drug level monitoring: Secondary | ICD-10-CM

## 2016-06-01 LAB — POCT INR: INR: 2.2

## 2016-06-01 NOTE — Patient Instructions (Signed)
Pre visit review using our clinic review tool, if applicable. No additional management support is needed unless otherwise documented below in the visit note. 

## 2016-07-13 ENCOUNTER — Ambulatory Visit (INDEPENDENT_AMBULATORY_CARE_PROVIDER_SITE_OTHER): Payer: Medicare Other | Admitting: General Practice

## 2016-07-13 DIAGNOSIS — Z5181 Encounter for therapeutic drug level monitoring: Secondary | ICD-10-CM

## 2016-07-13 DIAGNOSIS — I4891 Unspecified atrial fibrillation: Secondary | ICD-10-CM

## 2016-07-13 LAB — POCT INR: INR: 2.7

## 2016-07-13 NOTE — Progress Notes (Signed)
I have reviewed and agree with note, evaluation, plan.   Calil Amor, MD  

## 2016-07-13 NOTE — Patient Instructions (Signed)
Pre visit review using our clinic review tool, if applicable. No additional management support is needed unless otherwise documented below in the visit note. 

## 2016-08-07 ENCOUNTER — Other Ambulatory Visit: Payer: Self-pay | Admitting: Family Medicine

## 2016-08-24 ENCOUNTER — Ambulatory Visit (INDEPENDENT_AMBULATORY_CARE_PROVIDER_SITE_OTHER): Payer: Medicare Other | Admitting: General Practice

## 2016-08-24 DIAGNOSIS — Z5181 Encounter for therapeutic drug level monitoring: Secondary | ICD-10-CM

## 2016-08-24 LAB — POCT INR: INR: 1.7

## 2016-08-24 NOTE — Patient Instructions (Signed)
Pre visit review using our clinic review tool, if applicable. No additional management support is needed unless otherwise documented below in the visit note. 

## 2016-08-24 NOTE — Progress Notes (Signed)
I have reviewed and agree with note, evaluation, plan.   Adaiah Jaskot, MD  

## 2016-09-23 ENCOUNTER — Ambulatory Visit (INDEPENDENT_AMBULATORY_CARE_PROVIDER_SITE_OTHER): Payer: Medicare Other | Admitting: Family Medicine

## 2016-09-23 ENCOUNTER — Encounter: Payer: Self-pay | Admitting: Family Medicine

## 2016-09-23 VITALS — BP 150/76 | HR 67 | Temp 97.7°F | Ht 71.0 in | Wt 163.8 lb

## 2016-09-23 DIAGNOSIS — R609 Edema, unspecified: Secondary | ICD-10-CM | POA: Diagnosis not present

## 2016-09-23 DIAGNOSIS — I10 Essential (primary) hypertension: Secondary | ICD-10-CM

## 2016-09-23 DIAGNOSIS — R200 Anesthesia of skin: Secondary | ICD-10-CM | POA: Diagnosis not present

## 2016-09-23 LAB — COMPREHENSIVE METABOLIC PANEL
ALBUMIN: 4.3 g/dL (ref 3.5–5.2)
ALK PHOS: 54 U/L (ref 39–117)
ALT: 13 U/L (ref 0–53)
AST: 26 U/L (ref 0–37)
BILIRUBIN TOTAL: 1.1 mg/dL (ref 0.2–1.2)
BUN: 19 mg/dL (ref 6–23)
CALCIUM: 9.9 mg/dL (ref 8.4–10.5)
CO2: 31 mEq/L (ref 19–32)
Chloride: 100 mEq/L (ref 96–112)
Creatinine, Ser: 1.07 mg/dL (ref 0.40–1.50)
GFR: 69.17 mL/min (ref >60.00)
Glucose, Bld: 115 mg/dL — ABNORMAL HIGH (ref 70–99)
POTASSIUM: 3.9 meq/L (ref 3.5–5.1)
Sodium: 139 mEq/L (ref 135–145)
TOTAL PROTEIN: 6.6 g/dL (ref 6.0–8.3)

## 2016-09-23 LAB — CBC
HEMATOCRIT: 44.7 % (ref 39.0–52.0)
Hemoglobin: 14.7 g/dL (ref 13.0–17.0)
MCHC: 32.8 g/dL (ref 30.0–36.0)
MCV: 93 fl (ref 78.0–100.0)
PLATELETS: 129 10*3/uL — AB (ref 150.0–400.0)
RBC: 4.8 Mil/uL (ref 4.22–5.81)
RDW: 14.7 % (ref 11.5–15.5)
WBC: 4.2 10*3/uL (ref 4.0–10.5)

## 2016-09-23 LAB — POC URINALSYSI DIPSTICK (AUTOMATED)
Bilirubin, UA: NEGATIVE
Glucose, UA: NEGATIVE
Ketones, UA: NEGATIVE
LEUKOCYTES UA: NEGATIVE
Nitrite, UA: NEGATIVE
PH UA: 6 (ref 5.0–8.0)
PROTEIN UA: NEGATIVE
Spec Grav, UA: 1.02 (ref 1.010–1.025)
UROBILINOGEN UA: 0.2 U/dL

## 2016-09-23 LAB — TSH: TSH: 3.77 u[IU]/mL (ref 0.35–4.50)

## 2016-09-23 NOTE — Addendum Note (Signed)
Addended by: Marin Olp on: 09/23/2016 09:34 AM   Modules accepted: Orders

## 2016-09-23 NOTE — Progress Notes (Signed)
Subjective:  William Mahoney is a 83 y.o. year old very pleasant male patient who presents for/with See problem oriented charting ROS- No chest pain or shortness of breath. No headache or blurry vision. No dizziness. Has some groin numbness. Admits to edema.    Past Medical History-  Patient Active Problem List   Diagnosis Date Noted  . Atrial fibrillation ([place]) 12/07/2008    Priority: High  . Insomnia 10/19/2014    Priority: Medium  . Gout 01/09/2014    Priority: Medium  . 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 Former smoker 06/23/2007    Priority: Medium  . HYPERLIPIDEMIA 02/09/2007    Priority: Medium  . Essential hypertension 12/07/2006    Priority: Medium  . COPD 12/07/2006    Priority: Medium  . BPH (benign prostatic hyperplasia) 12/07/2006    Priority: Medium  . Allergic rhinitis 08/15/2013    Priority: Low  . 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 Inguinal hernia 03/21/2010    Priority: Low  . BASAL CELL CARCINOMA SKIN LOWER LIMB INCL HIP 03/21/2010    Priority: Low  . Actinic keratosis 12/07/2008    Priority: Low  . OSTEOARTHRITIS 02/09/2007    Priority: Low  . PEPTIC ULCER DISEASE 12/07/2006    Priority: Low  . URINARY INCONTINENCE 12/07/2006    Priority: Low  . PANCREATITIS, HX OF 12/07/2006    Priority: Low  . Encounter for therapeutic drug monitoring 08/27/2014    Medications- reviewed and updated Current Outpatient Prescriptions  Medication Sig Dispense Refill  . allopurinol (ZYLOPRIM) 300 MG tablet TAKE ONE-HALF TABLET BY MOUTH IN THE EVENING 90 tablet 2  . colchicine 0.6 MG tablet Take 0.6 mg by mouth 2 (two) times daily as needed (gout).    . fish oil-omega-3 fatty acids 1000 MG capsule Take 1 g by mouth daily.    . fluticasone (FLONASE) 50 MCG/ACT nasal spray Place 2 sprays into both nostrils daily. 16 g 6  . loperamide (IMODIUM A-D) 2 MG tablet Take 2 mg by mouth 4 (four) times  daily as needed for diarrhea or loose stools.    . metoprolol (LOPRESSOR) 50 MG tablet TAKE 1/2 TABLET BY MOUTH 2 TIMES DAILY 90 tablet 1  . Multiple Vitamin (MULTIVITAMIN) tablet Take 1 tablet by mouth daily.    . tamsulosin (FLOMAX) 0.4 MG CAPS capsule Take 0.4 mg by mouth.    . triamterene-hydrochlorothiazide (MAXZIDE-25) 37.5-25 MG tablet TAKE ONE-HALF TABLET BY MOUTH ONCE DAILY 90 tablet 1  . warfarin (COUMADIN) 2.5 MG tablet TAKE AS DIRECTED BY ANTICOAGULATION CLINIC 40 tablet 2  . zolpidem (AMBIEN) 5 MG tablet TAKE ONE-HALF TABLET BY MOUTH AT BEDTIME AS NEEDED FOR SLEEP 15 tablet 3   No current facility-administered medications for this visit.     Objective: BP (!) 150/76 (BP Location: Left Arm, Patient Position: Sitting, Cuff Size: Large)   Pulse 67   Temp 97.7 F (36.5 C) (Oral)   Ht 5\' 11"  (1.803 m)   Wt 163 lb 12.8 oz (74.3 kg)   BMI 22.85 kg/m  Gen: NAD, resting comfortably CV: irregularly irregular no murmurs rubs or gallops. No obvious JVD.  Lungs: CTAB no crackles, wheeze, rhonchi Abdomen: soft/nontender/nondistended/normal bowel sounds. No rebound or guarding.  Ext: 1+ edema (none on last several notes) Skin: warm, dry Groin: scar from prior surgery, intact sensation, no hernia  Assessment/Plan:  Edema, unspecified type - Plan: CBC, Comprehensive metabolic panel,  TSH, POCT Urinalysis Dipstick (Automated), ECHOCARDIOGRAM COMPLETE S: Bilateral ankles swollen for at least a week. This morning does seem morning- not better every morning. Has had some nighttime cramping in lower legs- makes sleep difficult at times. No shortness of breath or chest pain. No thyroid enlargement  Numbness in groin at times- Dates back to surgery. Seems to be worse at night. Not getting worse over time.  A/P: Unclear cause off history and exam. With new edema- will do workup as noted above in plan. Possible diastolic dysfunction? Asks about lasix but prefer to get echo and labs first.   For  numbness in groin- near surgical site and since surgery- suspect this was related to prior surgery. Not worsening- ok to monitor only  Hypertension- BP up slightly today but did not take medicine yet- advised him to take as soon as he gets home. Near JNC8 goal- would prefer under 140.  BP Readings from Last 3 Encounters:  09/23/16 (!) 150/76  03/23/16 (!) 148/86  07/04/15 130/80   Follow up based on labs  Orders Placed This Encounter  Procedures  . CBC    [place]  . Comprehensive metabolic panel    [place]  . TSH    [place]  . POCT Urinalysis Dipstick (Automated)  . ECHOCARDIOGRAM COMPLETE    Standing Status:   Future    Standing Expiration Date:   12/24/2017    Order Specific Question:   Where should this test be performed    Answer:   [facility])    Order Specific Question:   Does the patient weigh less than or greater than 250 lbs?    Answer:   Patient weighs less than 250 lbs    Order Specific Question:   Complete or Limited study?    Answer:   Complete    Order Specific Question:   With Image Enhancing Agent or without Image Enhancing Agent?    Answer:   With Image Enhancing Agent    Order Specific Question:   Reason for exam-Echo    Answer:   Other-Full Diagnosis List    Order Specific Question:   Reason for Exam    Answer:   Edema [782.3.ICD-9-CM]   Return precautions advised.  [name], MD

## 2016-09-23 NOTE — Patient Instructions (Signed)
Please stop by lab before you go  We will call you within a week or two about your referral for echocardiogram to make sure swelling in legs is not from heart backing up fluid. If you do not hear within 3 weeks, give [place] a call.

## 2016-09-28 ENCOUNTER — Ambulatory Visit (INDEPENDENT_AMBULATORY_CARE_PROVIDER_SITE_OTHER): Payer: Medicare Other | Admitting: General Practice

## 2016-09-28 DIAGNOSIS — Z5181 Encounter for therapeutic drug level monitoring: Secondary | ICD-10-CM

## 2016-09-28 DIAGNOSIS — I4891 Unspecified atrial fibrillation: Secondary | ICD-10-CM

## 2016-09-28 LAB — POCT INR: INR: 2.7

## 2016-09-28 NOTE — Progress Notes (Signed)
I have reviewed and agree with note, evaluation, plan.   Stephen Hunter, MD  

## 2016-09-28 NOTE — Patient Instructions (Signed)
Pre visit review using our clinic review tool, if applicable. No additional management support is needed unless otherwise documented below in the visit note. 

## 2016-10-29 ENCOUNTER — Ambulatory Visit (HOSPITAL_COMMUNITY): Payer: Medicare Other | Attending: Cardiology

## 2016-10-29 ENCOUNTER — Other Ambulatory Visit: Payer: Self-pay

## 2016-10-29 DIAGNOSIS — I340 Nonrheumatic mitral (valve) insufficiency: Secondary | ICD-10-CM | POA: Insufficient documentation

## 2016-10-29 DIAGNOSIS — R609 Edema, unspecified: Secondary | ICD-10-CM | POA: Diagnosis not present

## 2016-11-03 ENCOUNTER — Telehealth: Payer: Self-pay | Admitting: Family Medicine

## 2016-11-03 NOTE — Telephone Encounter (Signed)
Reviewed over lab results with paitent

## 2016-11-03 NOTE — Telephone Encounter (Signed)
Pt calling to get his echo results

## 2016-11-09 ENCOUNTER — Ambulatory Visit (INDEPENDENT_AMBULATORY_CARE_PROVIDER_SITE_OTHER): Payer: Medicare Other | Admitting: General Practice

## 2016-11-09 DIAGNOSIS — Z5181 Encounter for therapeutic drug level monitoring: Secondary | ICD-10-CM | POA: Diagnosis not present

## 2016-11-09 DIAGNOSIS — I4891 Unspecified atrial fibrillation: Secondary | ICD-10-CM

## 2016-11-09 LAB — POCT INR: INR: 1.6

## 2016-11-09 NOTE — Patient Instructions (Signed)
Pre visit review using our clinic review tool, if applicable. No additional management support is needed unless otherwise documented below in the visit note. 

## 2016-11-09 NOTE — Progress Notes (Signed)
I have reviewed and agree with note, evaluation, plan.   Neysa Arts, MD  

## 2016-11-10 ENCOUNTER — Ambulatory Visit (INDEPENDENT_AMBULATORY_CARE_PROVIDER_SITE_OTHER): Payer: Medicare Other | Admitting: Family Medicine

## 2016-11-10 ENCOUNTER — Encounter: Payer: Self-pay | Admitting: Family Medicine

## 2016-11-10 VITALS — BP 120/72 | HR 52 | Temp 97.8°F | Ht 71.0 in | Wt 162.6 lb

## 2016-11-10 DIAGNOSIS — D696 Thrombocytopenia, unspecified: Secondary | ICD-10-CM | POA: Diagnosis not present

## 2016-11-10 DIAGNOSIS — R319 Hematuria, unspecified: Secondary | ICD-10-CM | POA: Diagnosis not present

## 2016-11-10 DIAGNOSIS — R609 Edema, unspecified: Secondary | ICD-10-CM | POA: Diagnosis not present

## 2016-11-10 LAB — URINALYSIS, MICROSCOPIC ONLY
RBC / HPF: NONE SEEN (ref 0–?)
WBC UA: NONE SEEN (ref 0–?)

## 2016-11-10 MED ORDER — FUROSEMIDE 20 MG PO TABS: 20.0000 mg | ORAL_TABLET | Freq: Every day | ORAL | 1 refills | Status: DC | PRN

## 2016-11-10 NOTE — Assessment & Plan Note (Signed)
S: Prior lab and echo workup for edema without unclear cause. Does states in report that due to a fib- insufficient to assess for diastolic dysfunction which was the main concern.   Swelling does improve in AM but worsens during day- could have venous insufficiency element.   A/P: 1+ edema.   I am concerned about diastolic dysfunction/CHF as cause of symptoms. Will trial lasix 20mg  on mondays and fridays with follow up in 3 weeks. If not improved and creatinine/potassium ok may increase further. Did discuss slight gout risks  I think there may be venous insufficiency but with 1+ pulses only did not want to trial compression stockings. Did discuss elevation.

## 2016-11-10 NOTE — Assessment & Plan Note (Signed)
Platelets hair low on last check- his baseline appears to be low normal to slightly low- monitor only as long as rest of cell lines remain normal

## 2016-11-10 NOTE — Progress Notes (Signed)
Subjective:  William Mahoney is a 83 y.o. year old very pleasant male patient who presents for/with See problem oriented charting ROS- continued edema, no chest pain. No increased shortness of breath . No orthopnea or PND.   Past Medical History-  Patient Active Problem List   Diagnosis Date Noted  . Edema 11/10/2016    Priority: High  . Atrial fibrillation ([place]) 12/07/2008    Priority: High  . Insomnia 10/19/2014    Priority: Medium  . Gout 01/09/2014    Priority: Medium  . 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 Former smoker 06/23/2007    Priority: Medium  . HYPERLIPIDEMIA 02/09/2007    Priority: Medium  . Essential hypertension 12/07/2006    Priority: Medium  . COPD 12/07/2006    Priority: Medium  . BPH (benign prostatic hyperplasia) 12/07/2006    Priority: Medium  . Allergic rhinitis 08/15/2013    Priority: Low  . 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 Inguinal hernia 03/21/2010    Priority: Low  . BASAL CELL CARCINOMA SKIN LOWER LIMB INCL HIP 03/21/2010    Priority: Low  . Actinic keratosis 12/07/2008    Priority: Low  . OSTEOARTHRITIS 02/09/2007    Priority: Low  . PEPTIC ULCER DISEASE 12/07/2006    Priority: Low  . URINARY INCONTINENCE 12/07/2006    Priority: Low  . PANCREATITIS, HX OF 12/07/2006    Priority: Low  . Thrombocytopenia ([place]) 11/10/2016  . Encounter for therapeutic drug monitoring 08/27/2014    Medications- reviewed and updated Current Outpatient Prescriptions  Medication Sig Dispense Refill  . allopurinol (ZYLOPRIM) 300 MG tablet TAKE ONE-HALF TABLET BY MOUTH IN THE EVENING 90 tablet 2  . colchicine 0.6 MG tablet Take 0.6 mg by mouth 2 (two) times daily as needed (gout).    . fish oil-omega-3 fatty acids 1000 MG capsule Take 1 g by mouth daily.    . fluticasone (FLONASE) 50 MCG/ACT nasal spray Place 2 sprays into both nostrils daily. 16 g 6  . loperamide (IMODIUM A-D) 2 MG  tablet Take 2 mg by mouth 4 (four) times daily as needed for diarrhea or loose stools.    . metoprolol (LOPRESSOR) 50 MG tablet TAKE 1/2 TABLET BY MOUTH 2 TIMES DAILY 90 tablet 1  . Multiple Vitamin (MULTIVITAMIN) tablet Take 1 tablet by mouth daily.    . tamsulosin (FLOMAX) 0.4 MG CAPS capsule Take 0.4 mg by mouth.    . triamterene-hydrochlorothiazide (MAXZIDE-25) 37.5-25 MG tablet TAKE ONE-HALF TABLET BY MOUTH ONCE DAILY 90 tablet 1  . warfarin (COUMADIN) 2.5 MG tablet TAKE AS DIRECTED BY ANTICOAGULATION CLINIC 40 tablet 2  . zolpidem (AMBIEN) 5 MG tablet TAKE ONE-HALF TABLET BY MOUTH AT BEDTIME AS NEEDED FOR SLEEP 15 tablet 3  . furosemide (LASIX) 20 MG tablet Take 1 tablet (20 mg total) by mouth daily as needed. 30 tablet 1   No current facility-administered medications for this visit.     Objective: BP 120/72 (BP Location: Left Arm, Patient Position: Sitting, Cuff Size: Large)   Pulse (!) 52   Temp 97.8 F (36.6 C) (Oral)   Ht 5\' 11"  (1.803 m)   Wt 162 lb 9.6 oz (73.8 kg)   SpO2 95%   BMI 22.68 kg/m  Gen: NAD, resting comfortably CV: irregularly irregular.  no murmurs rubs or gallops Lungs: CTAB no crackles, wheeze, rhonchi Abdomen: soft/nontender/nondistended/normal bowel sounds. No rebound or guarding.  Ext: 1+ edema,  1+ DP and PT pulses Skin: warm, dry  Assessment/Plan:  Hematuria - noted on last labs in [facility]- will repeat microscopic testing of urine. No blood was seen.   Thrombocytopenia ([facility]) Platelets hair low on last check- his baseline appears to be low normal to slightly low- monitor only as long as rest of cell lines remain normal  Edema S: Prior lab and echo workup for edema without unclear cause. Does states in report that due to a fib- insufficient to assess for diastolic dysfunction which was the main concern.   Swelling does improve in AM but worsens during day- could have venous insufficiency element.   A/P: 1+ edema.   I am concerned about diastolic  dysfunction/CHF as cause of symptoms. Will trial lasix 20mg  on mondays and fridays with follow up in 3 weeks. If not improved and creatinine/potassium ok may increase further. Did discuss slight gout risks  I think there may be venous insufficiency but with 1+ pulses only did not want to trial compression stockings. Did discuss elevation.   3 week follow up  Orders Placed This Encounter  Procedures  . Urine Microscopic    Meds ordered this encounter  Medications  . furosemide (LASIX) 20 MG tablet    Sig: Take 1 tablet (20 mg total) by mouth daily as needed.    Dispense:  30 tablet    Refill:  1    Return precautions advised.  [name], MD

## 2016-11-10 NOTE — Patient Instructions (Addendum)
Please stop by lab before you go for urine test  Lets try lasix Monday and Friday and then follow up with me in 3 weeks- we will check on swelling and kidneys as well as potassium. I wonder if this could be back up of fluid from the heart.   Elevate legs above the heart level as much as possible  I want you to weight yourself daily at home and write it down- bring this with you. If weight goes up 3 lbs in a day or 5 lbs in 3 days see [place] back sooner.

## 2016-11-23 ENCOUNTER — Other Ambulatory Visit: Payer: Self-pay | Admitting: Family Medicine

## 2016-11-26 ENCOUNTER — Encounter: Payer: Self-pay | Admitting: Family Medicine

## 2016-11-26 ENCOUNTER — Ambulatory Visit (INDEPENDENT_AMBULATORY_CARE_PROVIDER_SITE_OTHER): Payer: Medicare Other | Admitting: Family Medicine

## 2016-11-26 VITALS — BP 118/66 | HR 48 | Temp 97.8°F | Ht 71.0 in | Wt 160.8 lb

## 2016-11-26 DIAGNOSIS — I4891 Unspecified atrial fibrillation: Secondary | ICD-10-CM | POA: Diagnosis not present

## 2016-11-26 DIAGNOSIS — R609 Edema, unspecified: Secondary | ICD-10-CM

## 2016-11-26 DIAGNOSIS — R001 Bradycardia, unspecified: Secondary | ICD-10-CM | POA: Diagnosis not present

## 2016-11-26 LAB — BASIC METABOLIC PANEL
BUN: 23 mg/dL (ref 6–23)
CALCIUM: 9.2 mg/dL (ref 8.4–10.5)
CO2: 35 mEq/L — ABNORMAL HIGH (ref 19–32)
Chloride: 101 mEq/L (ref 96–112)
Creatinine, Ser: 1.21 mg/dL (ref 0.40–1.50)
GFR: 60 mL/min — AB (ref >60.00)
GLUCOSE: 96 mg/dL (ref 70–99)
Potassium: 3.6 mEq/L (ref 3.5–5.1)
SODIUM: 140 meq/L (ref 135–145)

## 2016-11-26 MED ORDER — METOPROLOL TARTRATE 25 MG PO TABS: ORAL_TABLET | ORAL | 3 refills | Status: DC

## 2016-11-26 NOTE — Assessment & Plan Note (Signed)
S: compliant with coumadin. Bradycardic more than usual on last 2 checks.  A/P: will decrease metoprolol to 12.5mg  twice a day down from 25mg  twice a day.

## 2016-11-26 NOTE — Patient Instructions (Addendum)
Reduce metoprolol to 25mg  tablet. Take only half twice a day so in total will get 25mg  per day instead of 50mg  per day.    Continue lasix (fluid pill) - will have him trial just once a week now but if swelling increases can go back to twice a week. Continue to monitor home weights.   Please stop by lab before you go

## 2016-11-26 NOTE — Progress Notes (Signed)
Subjective:  William Mahoney is a 83 y.o. year old very pleasant male patient who presents for/with See problem oriented charting ROS- states occasionally feels lightheaded with standing, denies lightheadedness at present. No chest pain or shortness of breath.    Past Medical History-  Patient Active Problem List   Diagnosis Date Noted  . Edema 11/10/2016    Priority: High  . Atrial fibrillation ([place]) 12/07/2008    Priority: High  . Insomnia 10/19/2014    Priority: Medium  . Gout 01/09/2014    Priority: Medium  . 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 Former smoker 06/23/2007    Priority: Medium  . HYPERLIPIDEMIA 02/09/2007    Priority: Medium  . Essential hypertension 12/07/2006    Priority: Medium  . COPD 12/07/2006    Priority: Medium  . BPH (benign prostatic hyperplasia) 12/07/2006    Priority: Medium  . Allergic rhinitis 08/15/2013    Priority: Low  . 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 Inguinal hernia 03/21/2010    Priority: Low  . BASAL CELL CARCINOMA SKIN LOWER LIMB INCL HIP 03/21/2010    Priority: Low  . Actinic keratosis 12/07/2008    Priority: Low  . OSTEOARTHRITIS 02/09/2007    Priority: Low  . PEPTIC ULCER DISEASE 12/07/2006    Priority: Low  . URINARY INCONTINENCE 12/07/2006    Priority: Low  . PANCREATITIS, HX OF 12/07/2006    Priority: Low  . Thrombocytopenia ([place]) 11/10/2016  . Encounter for therapeutic drug monitoring 08/27/2014    Medications- reviewed and updated Current Outpatient Prescriptions  Medication Sig Dispense Refill  . allopurinol (ZYLOPRIM) 300 MG tablet TAKE ONE-HALF TABLET BY MOUTH IN THE EVENING 90 tablet 2  . colchicine 0.6 MG tablet Take 0.6 mg by mouth 2 (two) times daily as needed (gout).    . fish oil-omega-3 fatty acids 1000 MG capsule Take 1 g by mouth daily.    . fluticasone (FLONASE) 50 MCG/ACT nasal spray Place 2 sprays into both nostrils daily. 16  g 6  . furosemide (LASIX) 20 MG tablet Take 1 tablet (20 mg total) by mouth daily as needed. 30 tablet 1  . loperamide (IMODIUM A-D) 2 MG tablet Take 2 mg by mouth 4 (four) times daily as needed for diarrhea or loose stools.    . metoprolol (LOPRESSOR) 50 MG tablet TAKE 1/2 TABLET BY MOUTH 2 TIMES DAILY 90 tablet 1  . Multiple Vitamin (MULTIVITAMIN) tablet Take 1 tablet by mouth daily.    . tamsulosin (FLOMAX) 0.4 MG CAPS capsule Take 0.4 mg by mouth.    . triamterene-hydrochlorothiazide (MAXZIDE-25) 37.5-25 MG tablet TAKE ONE-HALF TABLET BY MOUTH ONCE DAILY 90 tablet 1  . warfarin (COUMADIN) 2.5 MG tablet TAKE AS DIRECTED BY ANTICOAGULATION CLINIC 40 tablet 2  . warfarin (COUMADIN) 2.5 MG tablet TAKE AS DIRECTED BY MOUTH BY  ANTICOAGULATION  CLINIC 120 tablet 1  . zolpidem (AMBIEN) 5 MG tablet TAKE ONE-HALF TABLET BY MOUTH AT BEDTIME AS NEEDED FOR SLEEP 15 tablet 3   Objective: BP 118/66 (BP Location: Left Arm, Patient Position: Sitting, Cuff Size: Large)   Pulse (!) 48   Temp 97.8 F (36.6 C) (Oral)   Ht 5\' 11"  (1.803 m)   Wt 160 lb 12.8 oz (72.9 kg)   SpO2 96%   BMI 22.43 kg/m  Gen: NAD, resting comfortably CV: irregularly irregular and bradycardic, no murmurs rubs or gallops Lungs: CTAB no crackles, wheeze,  rhonchi Abdomen: soft/nontender/nondistended/normal bowel sounds. No rebound or guarding.  Ext: trace edema Skin: warm, dry  Assessment/Plan:  Edema S: edema in legs fluctuate. Goes down each morning. Some days worse than others. Still taking half tablet of maxzide daily. Then lasix about twice a week.  A/P: this may be diastolic dysfunction related (not picked up due to a fib). Continue lasix - will have him trial just once a week now but if swelling increases can go back to twice a week. Continue to monitor home weights. Bmet today  Atrial fibrillation Bradycardia S: compliant with coumadin. Bradycardic more than usual on last 2 checks.  A/P: will decrease metoprolol to  12.5mg  twice a day down from 25mg  twice a day.   No Follow-up on file.  Orders Placed This Encounter  Procedures  . Basic metabolic panel    [name]    Meds ordered this encounter  Medications  . metoprolol tartrate (LOPRESSOR) 25 MG tablet    Sig: TAKE 1/2 TABLET BY MOUTH 2 TIMES DAILY    Dispense:  90 tablet    Refill:  3   Return precautions advised.  [name], MD

## 2016-11-26 NOTE — Assessment & Plan Note (Signed)
S: edema in legs fluctuate. Goes down each morning. Some days worse than others. Still taking half tablet of maxzide daily. Then lasix about twice a week.  A/P: this may be diastolic dysfunction related (not picked up due to a fib). Continue lasix - will have him trial just once a week now but if swelling increases can go back to twice a week. Continue to monitor home weights. Bmet today

## 2016-11-30 ENCOUNTER — Encounter: Payer: Self-pay | Admitting: Family Medicine

## 2016-12-07 ENCOUNTER — Ambulatory Visit (INDEPENDENT_AMBULATORY_CARE_PROVIDER_SITE_OTHER): Payer: Medicare Other | Admitting: General Practice

## 2016-12-07 DIAGNOSIS — Z5181 Encounter for therapeutic drug level monitoring: Secondary | ICD-10-CM

## 2016-12-07 DIAGNOSIS — I4891 Unspecified atrial fibrillation: Secondary | ICD-10-CM

## 2016-12-07 NOTE — Progress Notes (Signed)
I have reviewed and agree with note, evaluation, plan.   Arif Amendola, MD  

## 2016-12-07 NOTE — Patient Instructions (Signed)
Pre visit review using our clinic review tool, if applicable. No additional management support is needed unless otherwise documented below in the visit note. 

## 2017-02-02 ENCOUNTER — Ambulatory Visit (INDEPENDENT_AMBULATORY_CARE_PROVIDER_SITE_OTHER): Payer: Medicare Other | Admitting: *Deleted

## 2017-02-02 DIAGNOSIS — Z5181 Encounter for therapeutic drug level monitoring: Secondary | ICD-10-CM

## 2017-02-02 DIAGNOSIS — I4891 Unspecified atrial fibrillation: Secondary | ICD-10-CM | POA: Diagnosis not present

## 2017-02-02 LAB — POCT INR: INR: 2.4

## 2017-02-02 NOTE — Progress Notes (Signed)
I have reviewed and agree with note, evaluation, plan.   Sharona Rovner, MD  

## 2017-03-02 ENCOUNTER — Ambulatory Visit: Payer: Medicare Other

## 2017-03-12 ENCOUNTER — Other Ambulatory Visit: Payer: Self-pay | Admitting: Family Medicine

## 2017-04-15 ENCOUNTER — Other Ambulatory Visit: Payer: Self-pay | Admitting: Family Medicine

## 2017-05-18 ENCOUNTER — Ambulatory Visit: Payer: Medicare Other | Admitting: Family Medicine

## 2017-05-19 ENCOUNTER — Ambulatory Visit (INDEPENDENT_AMBULATORY_CARE_PROVIDER_SITE_OTHER): Payer: Medicare Other | Admitting: General Practice

## 2017-05-19 DIAGNOSIS — I4891 Unspecified atrial fibrillation: Secondary | ICD-10-CM

## 2017-05-19 DIAGNOSIS — Z5181 Encounter for therapeutic drug level monitoring: Secondary | ICD-10-CM

## 2017-05-19 LAB — POCT INR: INR: 1.7

## 2017-05-19 NOTE — Patient Instructions (Signed)
.  lbpcmh  Take 2 tablets today (1/30) and then continue to take 1 tablet daily except 1.5 tablets each Monday/Wed/Friday.  Repeat INR in 4 weeks.

## 2017-05-20 NOTE — Progress Notes (Signed)
I have reviewed this visit and I agree on the patient's plan of dosage and recommendations. Prestyn Mahn, DO   

## 2017-05-21 ENCOUNTER — Ambulatory Visit: Payer: Medicare Other | Admitting: Family Medicine

## 2017-05-21 ENCOUNTER — Encounter: Payer: Self-pay | Admitting: Family Medicine

## 2017-05-21 VITALS — BP 128/66 | HR 52 | Temp 97.5°F | Ht 71.0 in | Wt 165.6 lb

## 2017-05-21 DIAGNOSIS — M1A9XX0 Chronic gout, unspecified, without tophus (tophi): Secondary | ICD-10-CM

## 2017-05-21 DIAGNOSIS — I10 Essential (primary) hypertension: Secondary | ICD-10-CM | POA: Diagnosis not present

## 2017-05-21 DIAGNOSIS — F5101 Primary insomnia: Secondary | ICD-10-CM | POA: Diagnosis not present

## 2017-05-21 DIAGNOSIS — I4891 Unspecified atrial fibrillation: Secondary | ICD-10-CM

## 2017-05-21 DIAGNOSIS — R609 Edema, unspecified: Secondary | ICD-10-CM

## 2017-05-21 DIAGNOSIS — Z23 Encounter for immunization: Secondary | ICD-10-CM | POA: Diagnosis not present

## 2017-05-21 NOTE — Patient Instructions (Signed)
Blood pressure is doing fine- I would do the metoprolol closer to bedtime for your second dose.   Follow up with [place] in 4-6 months.   I would also like for you to sign up for an annual wellness visit with one of our nurses, Cassie or Manuela Schwartz, who both specialize in the annual wellness visit. This is a free benefit under medicare that may help [place] find additional ways to help you. Some highlights are reviewing medications, lifestyle, and doing a dementia screen.

## 2017-05-21 NOTE — Progress Notes (Signed)
Subjective:  William Mahoney is a 84 y.o. year old very pleasant male patient who presents for/with See problem oriented charting ROS- edema improved without meds. No chest pain or shortness of breath. No headaches or blurry vision   Past Medical History-  Patient Active Problem List   Diagnosis Date Noted  . Edema 11/10/2016    Priority: High  . Atrial fibrillation ([place]) 12/07/2008    Priority: High  . Insomnia 10/19/2014    Priority: Medium  . Gout 01/09/2014    Priority: Medium  . 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 Former smoker 06/23/2007    Priority: Medium  . HYPERLIPIDEMIA 02/09/2007    Priority: Medium  . Essential hypertension 12/07/2006    Priority: Medium  . COPD 12/07/2006    Priority: Medium  . BPH (benign prostatic hyperplasia) 12/07/2006    Priority: Medium  . Allergic rhinitis 08/15/2013    Priority: Low  . 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 Inguinal hernia 03/21/2010    Priority: Low  . BASAL CELL CARCINOMA SKIN LOWER LIMB INCL HIP 03/21/2010    Priority: Low  . Actinic keratosis 12/07/2008    Priority: Low  . OSTEOARTHRITIS 02/09/2007    Priority: Low  . PEPTIC ULCER DISEASE 12/07/2006    Priority: Low  . URINARY INCONTINENCE 12/07/2006    Priority: Low  . PANCREATITIS, HX OF 12/07/2006    Priority: Low  . Thrombocytopenia ([place]) 11/10/2016  . Encounter for therapeutic drug monitoring 08/27/2014    Medications- reviewed and updated Current Outpatient Medications  Medication Sig Dispense Refill  . allopurinol (ZYLOPRIM) 300 MG tablet TAKE ONE-HALF TABLET BY MOUTH IN THE EVENING 90 tablet 2  . colchicine 0.6 MG tablet Take 0.6 mg by mouth 2 (two) times daily as needed (gout).    . fish oil-omega-3 fatty acids 1000 MG capsule Take 1 g by mouth daily.    . fluticasone (FLONASE) 50 MCG/ACT nasal spray Place 2 sprays into both nostrils daily. 16 g 6  . furosemide (LASIX) 20 MG  tablet Take 1 tablet (20 mg total) by mouth daily as needed. 30 tablet 1  . loperamide (IMODIUM A-D) 2 MG tablet Take 2 mg by mouth 4 (four) times daily as needed for diarrhea or loose stools.    . metoprolol tartrate (LOPRESSOR) 25 MG tablet TAKE 1/2 TABLET BY MOUTH 2 TIMES DAILY 90 tablet 3  . Multiple Vitamin (MULTIVITAMIN) tablet Take 1 tablet by mouth daily.    . tamsulosin (FLOMAX) 0.4 MG CAPS capsule Take 0.4 mg by mouth.    . triamterene-hydrochlorothiazide (MAXZIDE-25) 37.5-25 MG tablet TAKE ONE-HALF TABLET BY MOUTH ONCE DAILY 45 tablet 3  . warfarin (COUMADIN) 2.5 MG tablet TAKE AS DIRECTED BY ANTICOAGULATION CLINIC 40 tablet 2  . zolpidem (AMBIEN) 5 MG tablet TAKE ONE-HALF TABLET BY MOUTH AT BEDTIME AS NEEDED FOR SLEEP 15 tablet 3   No current facility-administered medications for this visit.     Objective: BP 128/66 (BP Location: Left Arm, Patient Position: Sitting, Cuff Size: Large)   Pulse (!) 52   Temp (!) 97.5 F (36.4 C) (Oral)   Ht 5\' 11"  (1.803 m)   Wt 165 lb 9.6 oz (75.1 kg)   SpO2 100%   BMI 23.10 kg/m  Gen: NAD, resting comfortably CV: irregularly irregular no murmurs rubs or gallops Lungs: CTAB no crackles, wheeze, rhonchi Abdomen: soft/nontender/nondistended/normal bowel sounds. Ext: no edema Skin: warm, dry  Assessment/Plan: Edema S: last visit advised patient to trial lasix just once a week- he actually stopped and has not noted increased sweeling. He also takes half tablet of maxzide daily. Weight up 5 lbs but also went through the holidays A/P: luckily patient has done well without lasix- will continue to monitor  Essential hypertension S: controlled on metoprolol 12.5mg  BID. Some AM #s up to 150/90s - he states second dose of day was 2-3 pm. Also on triamterene hctz. He is not taking lasix anymore- appears stopped sometime since last visit BP Readings from Last 3 Encounters:  05/21/17 128/66  11/26/16 118/66  11/10/16 120/72  A/P: We discussed blood  pressure goal of <140/90. Continue current meds:  But move Pm dose of metoprolol into evening  Atrial fibrillation S: due to more bradycardia at last visit (48 HR) we decreased metoprolol to 12.5mg  from 25mg  BID. Remains on coumadin.  A/P: appears to be in atrial fibrillation- HR in better place- remains anticoagulated with coumadin- continue current meds  Insomnia Patient states he restarted old sleep rx but cannot remember name. Last rx for sleep was trazodone- if he calls in requesting this I would be willing to refill. He also used tylenol #3 for pain and felt that helped with sleep but that is not a good logn term solution for insomnia.   Gout Does not report any gout flares on allopurinol 300mg  per day. Likely update uric acid with next set of bloodwork Lab Results  Component Value Date   LABURIC 7.6 11/30/2011     Future Appointments  Date Time Provider [place]  06/10/2017  2:00 PM Williemae Area, RN [facility]  06/16/2017 11:00 AM [facility]  11/18/2017 11:00 AM Marin Olp, MD [facility]   Lab/Order associations: Need for prophylactic vaccination and inoculation against influenza - Plan: Flu vaccine HIGH DOSE PF  Return precautions advised.  [name], MD

## 2017-05-22 NOTE — Assessment & Plan Note (Signed)
S: controlled on metoprolol 12.5mg  BID. Some AM #s up to 150/90s - he states second dose of day was 2-3 pm. Also on triamterene hctz. He is not taking lasix anymore- appears stopped sometime since last visit BP Readings from Last 3 Encounters:  05/21/17 128/66  11/26/16 118/66  11/10/16 120/72  A/P: We discussed blood pressure goal of <140/90. Continue current meds:  But move Pm dose of metoprolol into evening

## 2017-05-22 NOTE — Assessment & Plan Note (Signed)
Does not report any gout flares on allopurinol 300mg  per day. Likely update uric acid with next set of bloodwork Lab Results  Component Value Date   LABURIC 7.6 11/30/2011

## 2017-05-22 NOTE — Assessment & Plan Note (Signed)
S: last visit advised patient to trial lasix just once a week- he actually stopped and has not noted increased sweeling. He also takes half tablet of maxzide daily. Weight up 5 lbs but also went through the holidays A/P: luckily patient has done well without lasix- will continue to monitor

## 2017-05-22 NOTE — Assessment & Plan Note (Signed)
Patient states he restarted old sleep rx but cannot remember name. Last rx for sleep was trazodone- if he calls in requesting this I would be willing to refill. He also used tylenol #3 for pain and felt that helped with sleep but that is not a good logn term solution for insomnia.

## 2017-05-22 NOTE — Assessment & Plan Note (Signed)
S: due to more bradycardia at last visit (48 HR) we decreased metoprolol to 12.5mg  from 25mg  BID. Remains on coumadin.  A/P: appears to be in atrial fibrillation- HR in better place- remains anticoagulated with coumadin- continue current meds

## 2017-06-10 ENCOUNTER — Ambulatory Visit: Payer: Medicare Other | Admitting: *Deleted

## 2017-06-15 ENCOUNTER — Other Ambulatory Visit: Payer: Self-pay | Admitting: Family Medicine

## 2017-06-16 ENCOUNTER — Ambulatory Visit: Payer: Medicare Other | Admitting: General Practice

## 2017-06-16 DIAGNOSIS — I4891 Unspecified atrial fibrillation: Secondary | ICD-10-CM

## 2017-06-16 DIAGNOSIS — Z5181 Encounter for therapeutic drug level monitoring: Secondary | ICD-10-CM

## 2017-06-16 LAB — POCT INR: INR: 2

## 2017-06-16 NOTE — Progress Notes (Signed)
I have reviewed this visit and I agree on the patient's plan of dosage and recommendations. Kamil Mchaffie, DO   

## 2017-06-16 NOTE — Patient Instructions (Addendum)
Pre visit review using our clinic review tool, if applicable. No additional management support is needed unless otherwise documented below in the visit note.   Continue to take 1 tablet daily except 1.5 tablets each Monday/Wed/Friday.  Repeat INR in 4 weeks.

## 2017-06-17 ENCOUNTER — Encounter: Payer: Self-pay | Admitting: *Deleted

## 2017-06-17 ENCOUNTER — Ambulatory Visit (INDEPENDENT_AMBULATORY_CARE_PROVIDER_SITE_OTHER): Payer: Medicare Other | Admitting: *Deleted

## 2017-06-17 VITALS — BP 100/60 | HR 49 | Ht 71.0 in | Wt 164.0 lb

## 2017-06-17 DIAGNOSIS — Z0000 Encounter for general adult medical examination without abnormal findings: Secondary | ICD-10-CM

## 2017-06-17 MED ORDER — TRAZODONE HCL 50 MG PO TABS: 25.0000 mg | ORAL_TABLET | Freq: Every evening | ORAL | 3 refills | Status: DC | PRN

## 2017-06-17 NOTE — Progress Notes (Signed)
I have personally reviewed the Medicare Annual Wellness Visit and agree with the assessment and plan.  Algis Greenhouse. Jerline Pain, MD 06/17/2017 5:03 PM

## 2017-06-17 NOTE — Patient Instructions (Addendum)
William Mahoney , Thank you for taking time to come for your Medicare Wellness Visit. I appreciate your ongoing commitment to your health goals. Please review the following plan we discussed and let me know if I can assist you in the future.   Will have a hearing test  Will make an apt with Dr. Gwenlyn Found   Will schedule when you leave with Dr Yong Channel to discuss sleep med and heart rate  Issues. (actually apt with Dr. Jerline Pain as Dr. Yong Channel is out of the office )   Shingrix is a vaccine for the prevention of Shingles in Adults 81 and older.  If you are on Medicare, you can request a prescription from your doctor to be filled at a pharmacy.  Please check with your benefits regarding applicable copays or out of pocket expenses.  The Shingrix is given in 2 vaccines approx 8 weeks apart. You must receive the 2nd dose prior to 6 months from receipt of the first.      These are the goals we discussed: Goals    . Patient Stated     Will try pool exercise or get out with wife Try the y nearby       This is a list of the screening recommended for you and due dates:  Health Maintenance  Topic Date Due  . Tetanus Vaccine  11/11/2022  . Flu Shot  Completed  . Pneumonia vaccines  Completed      Fall Prevention in the Home Falls can cause injuries. They can happen to people of all ages. There are many things you can do to make your home safe and to help prevent falls. What can I do on the outside of my home?  Regularly fix the edges of walkways and driveways and fix any cracks.  Remove anything that might make you trip as you walk through a door, such as a raised step or threshold.  Trim any bushes or trees on the path to your home.  Use bright outdoor lighting.  Clear any walking paths of anything that might make someone trip, such as rocks or tools.  Regularly check to see if handrails are loose or broken. Make sure that both sides of any steps have handrails.  Any raised decks and  porches should have guardrails on the edges.  Have any leaves, snow, or ice cleared regularly.  Use sand or salt on walking paths during winter.  Clean up any spills in your garage right away. This includes oil or grease spills. What can I do in the bathroom?  Use night lights.  Install grab bars by the toilet and in the tub and shower. Do not use towel bars as grab bars.  Use non-skid mats or decals in the tub or shower.  If you need to sit down in the shower, use a plastic, non-slip stool.  Keep the floor dry. Clean up any water that spills on the floor as soon as it happens.  Remove soap buildup in the tub or shower regularly.  Attach bath mats securely with double-sided non-slip rug tape.  Do not have throw rugs and other things on the floor that can make you trip. What can I do in the bedroom?  Use night lights.  Make sure that you have a light by your bed that is easy to reach.  Do not use any sheets or blankets that are too big for your bed. They should not hang down onto the floor.  Have a  firm chair that has side arms. You can use this for support while you get dressed.  Do not have throw rugs and other things on the floor that can make you trip. What can I do in the kitchen?  Clean up any spills right away.  Avoid walking on wet floors.  Keep items that you use a lot in easy-to-reach places.  If you need to reach something above you, use a strong step stool that has a grab bar.  Keep electrical cords out of the way.  Do not use floor polish or wax that makes floors slippery. If you must use wax, use non-skid floor wax.  Do not have throw rugs and other things on the floor that can make you trip. What can I do with my stairs?  Do not leave any items on the stairs.  Make sure that there are handrails on both sides of the stairs and use them. Fix handrails that are broken or loose. Make sure that handrails are as long as the stairways.  Check any  carpeting to make sure that it is firmly attached to the stairs. Fix any carpet that is loose or worn.  Avoid having throw rugs at the top or bottom of the stairs. If you do have throw rugs, attach them to the floor with carpet tape.  Make sure that you have a light switch at the top of the stairs and the bottom of the stairs. If you do not have them, ask someone to add them for you. What else can I do to help prevent falls?  Wear shoes that: ? Do not have high heels. ? Have rubber bottoms. ? Are comfortable and fit you well. ? Are closed at the toe. Do not wear sandals.  If you use a stepladder: ? Make sure that it is fully opened. Do not climb a closed stepladder. ? Make sure that both sides of the stepladder are locked into place. ? Ask someone to hold it for you, if possible.  Clearly mark and make sure that you can see: ? Any grab bars or handrails. ? First and last steps. ? Where the edge of each step is.  Use tools that help you move around (mobility aids) if they are needed. These include: ? Canes. ? Walkers. ? Scooters. ? Crutches.  Turn on the lights when you go into a dark area. Replace any light bulbs as soon as they burn out.  Set up your furniture so you have a clear path. Avoid moving your furniture around.  If any of your floors are uneven, fix them.  If there are any pets around you, be aware of where they are.  Review your medicines with your doctor. Some medicines can make you feel dizzy. This can increase your chance of falling. Ask your doctor what other things that you can do to help prevent falls. This information is not intended to replace advice given to you by your health care provider. Make sure you discuss any questions you have with your health care provider. Document Released: 01/31/2009 Document Revised: 09/12/2015 Document Reviewed: 05/11/2014 Elsevier Interactive Patient Education  2018 [employer] Maintenance, Male A healthy  lifestyle and preventive care is important for your health and wellness. Ask your health care provider about what schedule of regular examinations is right for you. What should I know about weight and diet? Eat a Healthy Diet  Eat plenty of vegetables, fruits, whole grains, low-fat dairy products, and  lean protein.  Do not eat a lot of foods high in solid fats, added sugars, or salt.  Maintain a Healthy Weight Regular exercise can help you achieve or maintain a healthy weight. You should:  Do at least 150 minutes of exercise each week. The exercise should increase your heart rate and make you sweat (moderate-intensity exercise).  Do strength-training exercises at least twice a week.  Watch Your Levels of Cholesterol and Blood Lipids  Have your blood tested for lipids and cholesterol every 5 years starting at 82 years of age. If you are at high risk for heart disease, you should start having your blood tested when you are 82 years old. You may need to have your cholesterol levels checked more often if: ? Your lipid or cholesterol levels are high. ? You are older than 82 years of age. ? You are at high risk for heart disease.  What should I know about cancer screening? Many types of cancers can be detected early and may often be prevented. Lung Cancer  You should be screened every year for lung cancer if: ? You are a current smoker who has smoked for at least 30 years. ? You are a former smoker who has quit within the past 15 years.  Talk to your health care provider about your screening options, when you should start screening, and how often you should be screened.  Colorectal Cancer  Routine colorectal cancer screening usually begins at 82 years of age and should be repeated every 5-10 years until you are 82 years old. You may need to be screened more often if early forms of precancerous polyps or small growths are found. Your health care provider may recommend screening at an  earlier age if you have risk factors for colon cancer.  Your health care provider may recommend using home test kits to check for hidden blood in the stool.  A small camera at the end of a tube can be used to examine your colon (sigmoidoscopy or colonoscopy). This checks for the earliest forms of colorectal cancer.  Prostate and Testicular Cancer  Depending on your age and overall health, your health care provider may do certain tests to screen for prostate and testicular cancer.  Talk to your health care provider about any symptoms or concerns you have about testicular or prostate cancer.  Skin Cancer  Check your skin from head to toe regularly.  Tell your health care provider about any new moles or changes in moles, especially if: ? There is a change in a mole's size, shape, or color. ? You have a mole that is larger than a pencil eraser.  Always use sunscreen. Apply sunscreen liberally and repeat throughout the day.  Protect yourself by wearing long sleeves, pants, a wide-brimmed hat, and sunglasses when outside.  What should I know about heart disease, diabetes, and high blood pressure?  If you are 36-82 years of age, have your blood pressure checked every 3-5 years. If you are 32 years of age or older, have your blood pressure checked every year. You should have your blood pressure measured twice-once when you are at a hospital or clinic, and once when you are not at a hospital or clinic. Record the average of the two measurements. To check your blood pressure when you are not at a hospital or clinic, you can use: ? An automated blood pressure machine at a pharmacy. ? A home blood pressure monitor.  Talk to your health care provider about  your target blood pressure.  If you are between 3-73 years old, ask your health care provider if you should take aspirin to prevent heart disease.  Have regular diabetes screenings by checking your fasting blood sugar level. ? If you are at  a normal weight and have a low risk for diabetes, have this test once every three years after the age of 45. ? If you are overweight and have a high risk for diabetes, consider being tested at a younger age or more often.  A one-time screening for abdominal aortic aneurysm (AAA) by ultrasound is recommended for men aged 109-75 years who are current or former smokers. What should I know about preventing infection? Hepatitis B If you have a higher risk for hepatitis B, you should be screened for this virus. Talk with your health care provider to find out if you are at risk for hepatitis B infection. Hepatitis C Blood testing is recommended for:  Everyone born from [birth year] through 1963.  Anyone with known risk factors for hepatitis C.  Sexually Transmitted Diseases (STDs)  You should be screened each year for STDs including gonorrhea and chlamydia if: ? You are sexually active and are younger than 82 years of age. ? You are older than 82 years of age and your health care provider tells you that you are at risk for this type of infection. ? Your sexual activity has changed since you were last screened and you are at an increased risk for chlamydia or gonorrhea. Ask your health care provider if you are at risk.  Talk with your health care provider about whether you are at high risk of being infected with HIV. Your health care provider may recommend a prescription medicine to help prevent HIV infection.  What else can I do?  Schedule regular health, dental, and eye exams.  Stay current with your vaccines (immunizations).  Do not use any tobacco products, such as cigarettes, chewing tobacco, and e-cigarettes. If you need help quitting, ask your health care provider.  Limit alcohol intake to no more than 2 drinks per day. One drink equals 12 ounces of beer, 5 ounces of wine, or 1 ounces of hard liquor.  Do not use street drugs.  Do not share needles.  Ask your health care provider for help  if you need support or information about quitting drugs.  Tell your health care provider if you often feel depressed.  Tell your health care provider if you have ever been abused or do not feel safe at home. This information is not intended to replace advice given to you by your health care provider. Make sure you discuss any questions you have with your health care provider. Document Released: 10/03/2007 Document Revised: 12/04/2015 Document Reviewed: 01/08/2015 Elsevier Interactive Patient Education  [employer].

## 2017-06-17 NOTE — Progress Notes (Signed)
Subjective:   William Mahoney is a 84 y.o. male who presents for Medicare Annual/Subsequent preventive examination.  Reports health as goode Macular Degeneration - vision not good Last OV 05/21/2017  Diet Lipids 2012; hdl 36 trig 110  BMI 22  Breakfast oatmeal or bowl of cereal ; bb; banana Lunch; soup- homemade chicken soup Supper dtr cooks   Exercise Works on [employer] around the home   There are no preventive care reminders to display for this patient.   Colonoscopy 06/2012  EToh hx of ETOH; quit 80's  Tobacco; does not meet LDCT guidelines due to age Does not meet AAA guidelines due to age    Cardiac Risk Factors include: advanced age (>62men, >40 women);family history of premature cardiovascular disease;hypertension;sedentary lifestyle    Objective:    Vitals: BP 100/60   Pulse (!) 49   Ht 5\' 11"  (1.803 m)   Wt 164 lb (74.4 kg)   SpO2 98%   BMI 22.87 kg/m   Body mass index is 22.87 kg/m.  Advanced Directives 06/17/2017 11/02/2014 03/16/2014  Does Patient Have a Medical Advance Directive? No No No  Would patient like information on creating a medical advance directive? - Yes - Educational materials given No - patient declined information   No but in process of completing. Lost dtr last July which was heart breaking  Tobacco Social History   Tobacco Use  Smoking Status Former Smoker  . Packs/day: 1.00  . Years: 55.00  . Pack years: 55.00  . Types: Cigarettes  . Last attempt to quit: 03/20/2008  . Years since quitting: 9.2  Smokeless Tobacco Never Used     Counseling given: Yes   Clinical Intake:     Past Medical History:  Diagnosis Date  . BPH (benign prostatic hypertrophy)   . Chronic atrial fibrillation [facility])    cardiologist--   dr berry  . COPD (chronic obstructive pulmonary disease) ([place])   . Diverticulosis of colon    MODERATE LEFT SIDE  . Epididymal cyst    w/ epididymalitis  . Full dentures   . 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 Hearing loss   . History of alcohol abuse    quit drinking in the mid 80's  . History of cirrhosis of liver    alcoholic--  hx alcohol abuse -- quit drinking 1980's  . History of gout   . History of melanoma excision    2013--  left ear lobe and back of hand  . History of pancreatitis    2008  . History of rectal cancer oncologist-  dr Benay Spice--  no recurrence   dx Sept 2009--  Stage II (T3N0)  s/p  sigmoid colectomy & low anterior resection 04-18-2008  and chemoradiation 2010  . History of shingles 07/27/2010  . History of squamous cell carcinoma excision    left lower leg  . Hyperlipidemia   . Hypertension   . Loose stools    DUE TO ANTIBIOTICS  . Macular degeneration    not sure which eye  . OA (osteoarthritis)   . RBBB (right bundle branch block)   . Renal lesion    chronic-- left side  . Sciatica of right side   . Urge urinary incontinence    intermittant   Past Surgical History:  Procedure Laterality Date  . CARDIAC CATHETERIZATION  2001  approx.  in [place]  . CATARACT EXTRACTION W/ INTRAOCULAR LENS  IMPLANT, BILATERAL    .  COLONOSCOPY  last one 06-27-2012  . EXPLORATORY LAPAROTOMY/  LOW ANTERIOR RESECTION/  SIGMOID COLECTOMY  04-18-2008   DR Dalbert Batman  . INCISION AND DRAINAGE ABSCESS N/A 11/02/2014   Procedure: INCISION AND DRAINAGE OF SCROTUM;  Surgeon: Ardis Hughs, MD;  [facility];  Service: Urology;  Laterality: N/A;  . INGUINAL HERNIA REPAIR Right 03/23/2014   Procedure: HERNIA REPAIR INGUINAL ADULT OPEN REPAIR RIGHT INGUINAL HERNIA REPAIR;  Surgeon: Fanny Skates, MD;  Location: [place];  Service: General;  Laterality: Right;  . INSERTION OF MESH Right 03/23/2014   Procedure: INSERTION OF MESH;  Surgeon: Fanny Skates, MD;  Location: [place];  Service: General;  Laterality: Right;  . LAPAROSCOPIC CHOLECYSTECTOMY  1990's  . MASS EXCISION N/A 11/02/2014   Procedure: EXCISION OF  EPIDIDYMAL CYST;  Surgeon: Ardis Hughs, MD;  Location: [facility];  Service: Urology;  Laterality: N/A;  . TONSILLECTOMY  as child  . TRANSTHORACIC ECHOCARDIOGRAM  05-11-2008   pseudonormal LV filling pattern,  ef 60-65%/  mild to moderate MV calcification no stenosis w/ mild to moderate regurg./  mild LAE and RAE/  mild TR   Family History  Problem Relation Age of Onset  . Hypertension Mother   . Lymphoma Father   . Lymphoma Unknown   . Stroke Unknown        1st degree relative  . Colon cancer Neg Hx   . Esophageal cancer Neg Hx   . Rectal cancer Neg Hx   . Stomach cancer Neg Hx    Social History   Socioeconomic History  . Marital status: Married    Spouse name: Not on file  . Number of children: 6  . Years of education: Not on file  . Highest education level: Not on file  Social Needs  . Financial resource strain: Not on file  . Food insecurity - worry: Not on file  . Food insecurity - inability: Not on file  . Transportation needs - medical: Not on file  . Transportation needs - non-medical: Not on file  Occupational History  . Occupation: Retired    Fish farm manager: RETIRED  Tobacco Use  . Smoking status: Former Smoker    Packs/day: 1.00    Years: 55.00    Pack years: 55.00    Types: Cigarettes    Last attempt to quit: 03/20/2008    Years since quitting: 9.2  . Smokeless tobacco: Never Used  Substance and Sexual Activity  . Alcohol use: No    Comment: hx alcohol abuse -- quit in 1980's   . Drug use: No  . Sexual activity: Not on file  Other Topics Concern  . Not on file  Social History Narrative   Married. 6 kids. Wife William Mahoney also a patient of Dr. Ronney Lion.       Retired.     Outpatient Encounter Medications as of 06/17/2017  Medication Sig  . allopurinol (ZYLOPRIM) 300 MG tablet TAKE ONE-HALF TABLET BY MOUTH IN THE EVENING  . colchicine 0.6 MG tablet Take 0.6 mg by mouth 2 (two) times daily as needed (gout).  . fish oil-omega-3 fatty  acids 1000 MG capsule Take 1 g by mouth daily.  . fluticasone (FLONASE) 50 MCG/ACT nasal spray Place 2 sprays into both nostrils daily.  [name] loperamide (IMODIUM A-D) 2 MG tablet Take 2 mg by mouth 4 (four) times daily as needed for diarrhea or loose stools.  . metoprolol tartrate (LOPRESSOR) 25 MG tablet TAKE 1/2 TABLET BY MOUTH  2 TIMES DAILY  . Multiple Vitamin (MULTIVITAMIN) tablet Take 1 tablet by mouth daily.  [name] triamterene-hydrochlorothiazide (MAXZIDE-25) 37.5-25 MG tablet TAKE ONE-HALF TABLET BY MOUTH ONCE DAILY  . warfarin (COUMADIN) 2.5 MG tablet TAKE AS DIRECTED BY ANTICOAGULATION CLINIC  . furosemide (LASIX) 20 MG tablet Take 1 tablet (20 mg total) by mouth daily as needed. (Patient not taking: Reported on 06/17/2017)  . tamsulosin (FLOMAX) 0.4 MG CAPS capsule Take 0.4 mg by mouth.  . traZODone (DESYREL) 50 MG tablet Take 0.5-1 tablets (25-50 mg total) by mouth at bedtime as needed for sleep.  [name] zolpidem (AMBIEN) 5 MG tablet TAKE ONE-HALF TABLET BY MOUTH AT BEDTIME AS NEEDED FOR SLEEP (Patient not taking: Reported on 06/17/2017)   No facility-administered encounter medications on file as of 06/17/2017.     Activities of Daily Living In your present state of health, do you have any difficulty performing the following activities: 06/17/2017  Hearing? Y  Vision? N  Comment states he sees "ok"   Difficulty concentrating or making decisions? N  Walking or climbing stairs? Y  Dressing or bathing? N  Doing errands, shopping? N  Preparing Food and eating ? N  Using the Toilet? N  In the past six months, have you accidently leaked urine? Y  Comment has medicine but doesn't take it anymore, more start it back   Do you have problems with loss of bowel control? N  Managing your Medications? N  Managing your Finances? N  Housekeeping or managing your Housekeeping? N  Some recent data might be hidden    Patient Care Team: Marin Olp, MD as PCP - General (Family Medicine)     Assessment:   This is a routine wellness examination for [place].  Exercise Activities and Dietary recommendations Current Exercise Habits: Home exercise routine, Type of exercise: walking, Intensity: Mild  Goals    . Patient Stated     Will try pool exercise or get out with wife Try the y nearby       Fall Risk Fall Risk  06/17/2017 05/21/2017 03/23/2016 07/10/2014 01/20/2013  Falls in the past year? No No No No No     Depression Screen PHQ 2/9 Scores 06/17/2017 05/21/2017 03/23/2016 07/10/2014  PHQ - 2 Score 0 0 0 0  PHQ- 9 Score - - - -    Cognitive Function MMSE - Mini Mental State Exam 06/17/2017  Not completed: (No Data)   Managing his wife's meds Ad8 score is normal      Immunization History  Administered Date(s) Administered  . Influenza Split 01/14/2011  . Influenza, High Dose Seasonal PF 01/20/2013, 05/21/2017  . Influenza,inj,Quad PF,6+ Mos 01/09/2014  . Pneumococcal Conjugate-13 07/10/2014  . Pneumococcal Polysaccharide-23 04/20/2006  . Td 11/10/2012     Screening Tests Health Maintenance  Topic Date Due  . TETANUS/TDAP  11/11/2022  . INFLUENZA VACCINE  Completed  . PNA vac Low Risk Adult  Completed      Plan:      PCP Notes   Health Maintenance Educated regarding the shingrix Overall, doing well with very few complaints    Abnormal Screens  Pulse reate 42 and 49 when rechecked. Checked radially and was 50  No symptoms; Plans to call Dr. Gwenlyn Found for apt to fup as he has not seen him since jan 2017. Will make an apt with Dr. Jerline Pain to fup in 2 weeks to recheck HR if he does not reach Dr. Gwenlyn Found; (DR. Yong Channel is out of the office )  Also request ambien for sleep; has not had since 2016 Had Trazadone .5 mg; (25 to 50 mg) by mouth at bedtime for sleep 2016 Dr. Yong Channel mentioned this in his note 05/21/17 he would be willing to refill Trazadone if he request it . Educated and told him to stop if he felt dizzy or confused etc.  Also can fup with Dr. Jerline Pain  in 2 weeks if he is still having issues   Hearing 2000hz  but may have a hearing screen with his wife    Referrals  Resources given for hearing screen  Patient concerns; C/o of allergies; tired nasal spray but did not take it for 2 weeks. Educated to try again and take x 2 weeks as it takes this long to be effective  Nurse Concerns; As noted   Next PCP apt 07/05/2016 with Dr. Jerline Pain to fup on sleep issues or HR if he did not get into see Dr. Gwenlyn Found.      I have personally reviewed and noted the following in the patient's chart:   . Medical and social history . Use of alcohol, tobacco or illicit drugs  . Current medications and supplements . Functional ability and status . Nutritional status . Physical activity . Advanced directives . List of other physicians . Hospitalizations, surgeries, and ER visits in previous 12 months . Vitals . Screenings to include cognitive, depression, and falls . Referrals and appointments  In addition, I have reviewed and discussed with patient certain preventive protocols, quality metrics, and best practice recommendations. A written personalized care plan for preventive services as well as general preventive health recommendations were provided to patient.     Wynetta Fines, RN  06/17/2017

## 2017-07-05 ENCOUNTER — Ambulatory Visit: Payer: Medicare Other | Admitting: Family Medicine

## 2017-07-20 ENCOUNTER — Ambulatory Visit (INDEPENDENT_AMBULATORY_CARE_PROVIDER_SITE_OTHER): Payer: Medicare Other | Admitting: Cardiovascular Disease

## 2017-07-20 ENCOUNTER — Telehealth: Payer: Self-pay | Admitting: Pharmacist Clinician (PhC)/ Clinical Pharmacy Specialist

## 2017-07-20 ENCOUNTER — Encounter: Payer: Self-pay | Admitting: Cardiovascular Disease

## 2017-07-20 VITALS — BP 130/90 | HR 51 | Ht 70.5 in | Wt 164.0 lb

## 2017-07-20 DIAGNOSIS — I4891 Unspecified atrial fibrillation: Secondary | ICD-10-CM

## 2017-07-20 DIAGNOSIS — I10 Essential (primary) hypertension: Secondary | ICD-10-CM | POA: Diagnosis not present

## 2017-07-20 MED ORDER — RIVAROXABAN 15 MG PO TABS: 15.0000 mg | ORAL_TABLET | Freq: Every day | ORAL | 1 refills | Status: DC

## 2017-07-20 MED ORDER — AMLODIPINE BESYLATE 5 MG PO TABS: 5.0000 mg | ORAL_TABLET | Freq: Every day | ORAL | 3 refills | Status: DC

## 2017-07-20 NOTE — Assessment & Plan Note (Signed)
History of essential hypertension blood pressure 130/90. He is on low-dose metoprolol and Maxzide. I'm going to add low-dose amlodipine 5 mg a dayand will have a blood pressure log over the next 30 days and see William Mahoney back in follow-up to reevaluate.

## 2017-07-20 NOTE — Assessment & Plan Note (Signed)
History of chronic atrial fibrillation rate controlled on beta blockade and Coumadin anticoagulation. We will discuss transitioning him to a NOAC

## 2017-07-20 NOTE — Telephone Encounter (Signed)
Pt is to start on Xarelto 15 mg for atrial fibrillation on April 3 after INR check.  If INR < 3 patient can switch to Xarelto for next dose.   Reviewed patients medication list.  Pt is not currently on any combined P-gp and strong CYP3A4 inhibitors/inducers (ketoconazole, traconazole, ritonavir, carbamazepine, phenytoin, rifampin, St. John's wort).  Reviewed labs.  SCr 1.21, Weight 74.4 kg, CrCl- 43.6.  Dose appropriate based on CrCl.   Hgb and HCT Within Normal Limits  A full discussion of the nature of anticoagulants has been carried out.  A benefit/risk analysis has been presented to the patient, so that they understand the justification for choosing anticoagulation with Xarelto at this time.  The need for compliance is stressed.  Pt is aware to take the medication once daily with the largest meal of the day.

## 2017-07-20 NOTE — Progress Notes (Signed)
07/20/2017 William Mahoney   25-Apr-1925  [id number]  Primary Physician Yong Channel Brayton Mars, MD Primary Cardiologist: Lorretta Harp MD William Mahoney, [place]  HPI:  William Mahoney is a 84 y.o.  thin-appearing married Caucasian male father of 63 children, grandfather of 8 grandchildren referred for preoperative clearance before a elective right inguinal hernia repair which was successfully performed by Dr. Dalbert Batman. His primary care physician is Dr. Yong Channel.  I last saw him in the office 05/21/15.His cardiovascular risk factor profile is notable for 90 pack years of tobacco abuse having quit 6 years ago. History of hypertension. He has never had a heart attack or stroke. He has had colon cancer which was surgically excised profile/31/09 after she stops smoking. He does have chronic atrial fibrillation only on aspirin though his CHA2DSVASC2 score is 2. I did begin him on Coumadin anticoagulation as a result of this.He had a stress test 15 years ago which led to cardiac catheterization which was apparently normal.  Since I saw him 2 years ago his beta blocker was cut in half because of bradycardia resulting in mild elevation in his blood pressure. He denies chest pain or shortness of breath.   Current Meds  Medication Sig  . allopurinol (ZYLOPRIM) 300 MG tablet TAKE ONE-HALF TABLET BY MOUTH IN THE EVENING  . colchicine 0.6 MG tablet Take 0.6 mg by mouth 2 (two) times daily as needed (gout).  . fish oil-omega-3 fatty acids 1000 MG capsule Take 1 g by mouth daily.  . fluticasone (FLONASE) 50 MCG/ACT nasal spray Place 2 sprays into both nostrils daily.  . metoprolol tartrate (LOPRESSOR) 25 MG tablet TAKE 1/2 TABLET BY MOUTH 2 TIMES DAILY  . Multiple Vitamin (MULTIVITAMIN) tablet Take 1 tablet by mouth daily.  [name] triamterene-hydrochlorothiazide (MAXZIDE-25) 37.5-25 MG tablet TAKE ONE-HALF TABLET BY MOUTH ONCE DAILY  . warfarin (COUMADIN) 2.5 MG tablet TAKE AS DIRECTED BY ANTICOAGULATION CLINIC  .  zolpidem (AMBIEN) 5 MG tablet TAKE ONE-HALF TABLET BY MOUTH AT BEDTIME AS NEEDED FOR SLEEP     Allergies  Allergen Reactions  . Flexeril [Cyclobenzaprine Hcl] Other (See Comments)       caused altered mental status    Social History   Socioeconomic History  . Marital status: Married    Spouse name: Not on file  . Number of children: 6  . Years of education: Not on file  . Highest education level: Not on file  Occupational History  . Occupation: Retired    Fish farm manager: RETIRED  Social Needs  . Financial resource strain: Not on file  . Food insecurity:    Worry: Not on file    Inability: Not on file  . Transportation needs:    Medical: Not on file    Non-medical: Not on file  Tobacco Use  . Smoking status: Former Smoker    Packs/day: 1.00    Years: 55.00    Pack years: 55.00    Types: Cigarettes    Last attempt to quit: 03/20/2008    Years since quitting: 9.3  . Smokeless tobacco: Never Used  Substance and Sexual Activity  . Alcohol use: No    Comment: hx alcohol abuse -- quit in 1980's   . Drug use: No  . Sexual activity: Not on file  Lifestyle  . Physical activity:    Days per week: Not on file    Minutes per session: Not on file  . Stress: Not on file  Relationships  . Social connections:  Talks on phone: Not on file    Gets together: Not on file    Attends religious service: Not on file    Active member of club or organization: Not on file    Attends meetings of clubs or organizations: Not on file    Relationship status: Not on file  . Intimate partner violence:    Fear of current or ex partner: Not on file    Emotionally abused: Not on file    Physically abused: Not on file    Forced sexual activity: Not on file  Other Topics Concern  . Not on file  Social History Narrative   Married. 6 kids. Wife William Mahoney also a patient of Dr. Ronney Lion.       Retired.      Review of Systems: General: negative for chills, fever, night sweats or weight changes.    Cardiovascular: negative for chest pain, dyspnea on exertion, edema, orthopnea, palpitations, paroxysmal nocturnal dyspnea or shortness of breath Dermatological: negative for rash Respiratory: negative for cough or wheezing Urologic: negative for hematuria Abdominal: negative for nausea, vomiting, diarrhea, bright red blood per rectum, melena, or hematemesis Neurologic: negative for visual changes, syncope, or dizziness All other systems reviewed and are otherwise negative except as noted above.    Blood pressure 130/90, pulse (!) 51, height 5' 10.5" (1.791 m), weight 164 lb (74.4 kg).  General appearance: alert and no distress Neck: no adenopathy, no carotid bruit, no JVD, supple, symmetrical, trachea midline and thyroid not enlarged, symmetric, no tenderness/mass/nodules Lungs: clear to auscultation bilaterally Heart: irregularly irregular rhythm Extremities: extremities normal, atraumatic, no cyanosis or edema Pulses: 2+ and symmetric Skin: Skin color, texture, turgor normal. No rashes or lesions Neurologic: Alert and oriented X 3, normal strength and tone. Normal symmetric reflexes. Normal coordination and gait  EKG A. Fib with ventricular response of 51, right bundle branch block. I personally reviewed this EKG.  ASSESSMENT AND PLAN:   Essential hypertension History of essential hypertension blood pressure 130/90. He is on low-dose metoprolol and Maxzide. I'm going to add low-dose amlodipine 5 mg a dayand will have a blood pressure log over the next 30 days and see William Mahoney back in follow-up to reevaluate.  Atrial fibrillation History of chronic atrial fibrillation rate controlled on beta blockade and Coumadin anticoagulation. We will discuss transitioning him to a [place] MD [place], [facility] 07/20/2017 12:16 PM

## 2017-07-20 NOTE — Patient Instructions (Addendum)
Medication Instructions: Your physician recommends that you continue on your current medications as directed. Please refer to the Current Medication list given to you today.  START Amlodipine 5 mg daily  Xarelto 15 mg daily with food.    Follow-Up: Your physician recommends that you schedule a follow-up appointment in: 1 month with PharmD for BP Check. Your physician has requested that you regularly monitor and record your blood pressure readings at home. Please use the same machine at the same time of day to check your readings and record them to bring to your follow-up visit.  Your physician wants you to follow-up in: 1 year with Dr. Gwenlyn Found. You will receive a reminder letter in the mail two months in advance. If you don't receive a letter, please call our office to schedule the follow-up appointment.  If you need a refill on your cardiac medications before your next appointment, please call your pharmacy.

## 2017-07-20 NOTE — Addendum Note (Signed)
Addended by: Therisa Doyne on: 07/20/2017 12:22 PM   Modules accepted: Orders

## 2017-07-21 ENCOUNTER — Ambulatory Visit (INDEPENDENT_AMBULATORY_CARE_PROVIDER_SITE_OTHER): Payer: Medicare Other | Admitting: General Practice

## 2017-07-21 DIAGNOSIS — Z5181 Encounter for therapeutic drug level monitoring: Secondary | ICD-10-CM

## 2017-07-21 DIAGNOSIS — I4891 Unspecified atrial fibrillation: Secondary | ICD-10-CM

## 2017-07-21 LAB — POCT INR: INR: 1.9

## 2017-07-21 NOTE — Progress Notes (Signed)
I have reviewed and agree with note, evaluation, plan.   Stephen Hunter, MD  

## 2017-07-21 NOTE — Patient Instructions (Addendum)
Pre visit review using our clinic review tool, if applicable. No additional management support is needed unless otherwise documented below in the visit note.  Take 2 tablets today and then continue to take 1 tablet daily except 1.5 tablets each Monday/Wed/Friday.  Repeat INR in 5 weeks.

## 2017-07-27 ENCOUNTER — Other Ambulatory Visit: Payer: Self-pay | Admitting: General Practice

## 2017-07-27 ENCOUNTER — Other Ambulatory Visit: Payer: Self-pay | Admitting: Family Medicine

## 2017-07-27 MED ORDER — WARFARIN SODIUM 2.5 MG PO TABS: ORAL_TABLET | ORAL | 3 refills | Status: DC

## 2017-08-20 ENCOUNTER — Ambulatory Visit (INDEPENDENT_AMBULATORY_CARE_PROVIDER_SITE_OTHER): Payer: Medicare Other | Admitting: Pharmacist Clinician (PhC)/ Clinical Pharmacy Specialist

## 2017-08-20 DIAGNOSIS — I10 Essential (primary) hypertension: Secondary | ICD-10-CM

## 2017-08-20 NOTE — Assessment & Plan Note (Signed)
Patient with well controlled blood pressure now on triamterene/hctz 37.5/25 and amlodipine 5.  Minimal swelling at onset of amlodipine appears to have resolved, but patient was advised to call should it return.  He should continue with home blood pressure checks several times each week and was given instructions on proper technique.   We can see him again in the future should he have any problems with his current regimen.

## 2017-08-20 NOTE — Patient Instructions (Signed)
  Your blood pressure today is 130/68  (goal is < 130/80)  Check your blood pressure at home several times each week and keep record of the readings.  Take your BP meds as follows:  Continue with all your current medications  Bring all of your meds, your BP cuff and your record of home blood pressures to your next appointment.  Exercise as you're able, try to walk approximately 30 minutes per day.  Keep salt intake to a minimum, especially watch canned and prepared boxed foods.  Eat more fresh fruits and vegetables and fewer canned items.  Avoid eating in fast food restaurants.    HOW TO TAKE YOUR BLOOD PRESSURE: . Rest 5 minutes before taking your blood pressure. .  Don't smoke or drink caffeinated beverages for at least 30 minutes before. . Take your blood pressure before (not after) you eat. . Sit comfortably with your back supported and both feet on the floor (don't cross your legs). . Elevate your arm to heart level on a table or a desk. . Use the proper sized cuff. It should fit smoothly and snugly around your bare upper arm. There should be enough room to slip a fingertip under the cuff. The bottom edge of the cuff should be 1 inch above the crease of the elbow. . Ideally, take 3 measurements at one sitting and record the average.

## 2017-08-20 NOTE — Progress Notes (Signed)
08/20/2017 William Mahoney May 03, 1925 [id number]   HPI:  William Mahoney is a 84 y.o. male patient of Dr Gwenlyn Found, with a PMH below who presents today for hypertension clinic evaluation.  In addition to hypertension, his medical history is significant for atrial fibrillation (on Xarelto), hyperlipidemia, BPH, prior colon cancer and thrombocytopenia.    When he saw Dr. Gwenlyn Found last month he was started on amlodipine 5 mg daily.  He was asked to keep track of home blood pressure readings and returns today for follow up.   Today he reports no chest pain, shortness of breath, dizziness or lightheadedness.  He did note a couple of days of minor swelling in his ankles but that has since resolved.  He reports that he has had hypertension for about 10 years, and that it has mostly been well controlled with the triamterene/hctz.  He is here today with his wife.   Blood Pressure Goal:  130/80  Current Medications:  Metoprolol 12.5 mg bid  Triamterene/hctz 37.5/25 - around noon  Amlodipine 5 mg qd - around noon  Family Hx:  Mother - died at 51 MI/hypertension  Father - died from cancer in 96's - lymphatic  1 brother died from MI at 13  1 daughter with hypertension  Social Hx:  Former smoker, quit with colon cancer dx (67 pk yr history - quit 10 years ago); no alcohol; 2-3 cups of coffee per day, regulal tea occasional hot cocoa  Diet:  Mostly home cooked foods, no added salts; some canned veggies; meats consist of chicken and beef;   Exercise:  Doing some home remodeling, some minimal yard work  Home BP readings:  Home cuff about 67-109 years old; Omron cuff. Checked daily for past month.  Average of 42 readings was 135/81  Intolerances:   CrCl cannot be calculated (Patient's most recent lab result is older than the maximum 21 days allowed.).  Wt Readings from Last 3 Encounters:  07/20/17 164 lb (74.4 kg)  06/17/17 164 lb (74.4 kg)  05/21/17 165 lb 9.6 oz (75.1 kg)   BP Readings from Last  3 Encounters:  08/20/17 130/64  07/20/17 130/90  06/17/17 100/60   Pulse Readings from Last 3 Encounters:  08/20/17 (!) 52  07/20/17 (!) 51  06/17/17 (!) 49    Current Outpatient Medications  Medication Sig Dispense Refill  . allopurinol (ZYLOPRIM) 300 MG tablet TAKE ONE-HALF TABLET BY MOUTH IN THE EVENING 45 tablet 5  . amLODipine (NORVASC) 5 MG tablet Take 1 tablet (5 mg total) by mouth daily. 30 tablet 3  . colchicine 0.6 MG tablet Take 0.6 mg by mouth 2 (two) times daily as needed (gout).    . fish oil-omega-3 fatty acids 1000 MG capsule Take 1 g by mouth daily.    . fluticasone (FLONASE) 50 MCG/ACT nasal spray Place 2 sprays into both nostrils daily. 16 g 6  . metoprolol tartrate (LOPRESSOR) 25 MG tablet TAKE 1/2 TABLET BY MOUTH 2 TIMES DAILY 90 tablet 3  . Multiple Vitamin (MULTIVITAMIN) tablet Take 1 tablet by mouth daily.    [name] triamterene-hydrochlorothiazide (MAXZIDE-25) 37.5-25 MG tablet TAKE ONE-HALF TABLET BY MOUTH ONCE DAILY 45 tablet 3  . warfarin (COUMADIN) 2.5 MG tablet TAKE AS DIRECTED BY ANTICOAGULATION CLINIC 40 tablet 3  . zolpidem (AMBIEN) 5 MG tablet TAKE ONE-HALF TABLET BY MOUTH AT BEDTIME AS NEEDED FOR SLEEP 15 tablet 3   No current facility-administered medications for this visit.     Allergies  Allergen Reactions  .  Flexeril [Cyclobenzaprine Hcl] Other (See Comments)       caused altered mental status    Past Medical History:  Diagnosis Date  . BPH (benign prostatic hypertrophy)   . Chronic atrial fibrillation [facility])    cardiologist--   dr berry  . COPD (chronic obstructive pulmonary disease) ([place])   . Diverticulosis of colon    MODERATE LEFT SIDE  . Epididymal cyst    w/ epididymalitis  . Full dentures   . 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 Hearing loss   . History of alcohol abuse    quit drinking in the mid 80's  . History of cirrhosis of liver    alcoholic--  hx alcohol abuse -- quit drinking  1980's  . History of gout   . History of melanoma excision    2013--  left ear lobe and back of hand  . History of pancreatitis    2008  . History of rectal cancer oncologist-  dr Benay Spice--  no recurrence   dx Sept 2009--  Stage II (T3N0)  s/p  sigmoid colectomy & low anterior resection 04-18-2008  and chemoradiation 2010  . History of shingles 07/27/2010  . History of squamous cell carcinoma excision    left lower leg  . Hyperlipidemia   . Hypertension   . Loose stools    DUE TO ANTIBIOTICS  . Macular degeneration    not sure which eye  . OA (osteoarthritis)   . RBBB (right bundle branch block)   . Renal lesion    chronic-- left side  . Sciatica of right side   . Urge urinary incontinence    intermittant    Blood pressure 130/64, pulse (!) 52.  Standing 130/68  Essential hypertension Patient with well controlled blood pressure now on triamterene/hctz 37.5/25 and amlodipine 5.  Minimal swelling at onset of amlodipine appears to have resolved, but patient was advised to call should it return.  He should continue with home blood pressure checks several times each week and was given instructions on proper technique.   We can see him again in the future should he have any problems with his current regimen.     Tommy Medal PharmD CPP [facility] [address] [place], [place] [address] [phone]

## 2017-08-25 ENCOUNTER — Ambulatory Visit (INDEPENDENT_AMBULATORY_CARE_PROVIDER_SITE_OTHER): Payer: Medicare Other | Admitting: General Practice

## 2017-08-25 DIAGNOSIS — Z5181 Encounter for therapeutic drug level monitoring: Secondary | ICD-10-CM | POA: Diagnosis not present

## 2017-08-25 DIAGNOSIS — I4891 Unspecified atrial fibrillation: Secondary | ICD-10-CM | POA: Diagnosis not present

## 2017-08-25 LAB — POCT INR: INR: 1.9

## 2017-08-25 NOTE — Progress Notes (Signed)
I have reviewed and agree with note, evaluation, plan.   Delane Wessinger, MD  

## 2017-08-25 NOTE — Patient Instructions (Addendum)
Pre visit review using our clinic review tool, if applicable. No additional management support is needed unless otherwise documented below in the visit note.  Take 2 tablets today and then take 1 tablet daily except 2 tablets each Monday/Friday.  Repeat INR in 4 weeks.

## 2017-09-22 ENCOUNTER — Ambulatory Visit (INDEPENDENT_AMBULATORY_CARE_PROVIDER_SITE_OTHER): Payer: Medicare Other | Admitting: General Practice

## 2017-09-22 DIAGNOSIS — I4891 Unspecified atrial fibrillation: Secondary | ICD-10-CM

## 2017-09-22 DIAGNOSIS — Z5181 Encounter for therapeutic drug level monitoring: Secondary | ICD-10-CM

## 2017-09-22 LAB — POCT INR: INR: 2.5 (ref 2.0–3.0)

## 2017-09-22 NOTE — Patient Instructions (Addendum)
Pre visit review using our clinic review tool, if applicable. No additional management support is needed unless otherwise documented below in the visit note.  Continue to take 1 tablet daily except 2 tablets each Monday/Friday.  Repeat INR in 4 weeks.

## 2017-09-23 NOTE — Progress Notes (Signed)
I have reviewed and agree with note, evaluation, plan.   Kenneth Lax, MD  

## 2017-09-30 ENCOUNTER — Ambulatory Visit: Payer: Medicare Other | Admitting: Family Medicine

## 2017-09-30 ENCOUNTER — Encounter: Payer: Self-pay | Admitting: Family Medicine

## 2017-09-30 VITALS — BP 128/76 | HR 52 | Temp 97.8°F | Ht 70.5 in | Wt 166.8 lb

## 2017-09-30 DIAGNOSIS — I10 Essential (primary) hypertension: Secondary | ICD-10-CM

## 2017-09-30 DIAGNOSIS — H6121 Impacted cerumen, right ear: Secondary | ICD-10-CM | POA: Diagnosis not present

## 2017-09-30 DIAGNOSIS — R609 Edema, unspecified: Secondary | ICD-10-CM | POA: Diagnosis not present

## 2017-09-30 NOTE — Assessment & Plan Note (Addendum)
Edema S:  patient complains of lower extremity swelling. 2 months ago had amlodipine 5 mg added to his maxzide and metoprolol due to diastolic of 90. Weight up 1 lb from last visit from last visit here. No shortness of breath or increased fatigue  BP looks good on triamterene-hctz, metoprolol 12.5mg  BID, plus amlodipine 5mg . He has taken several doses of lasix that he had from the past to try to help with the swelling- seems to help some A/P:  We are going to reduce amlodipine to 2.5mg . Can use sparing lasix for increased edema or weight gain. Recheck in a few weeks. Hopeful some improvement with lower dose amlodipine  Will also get some labs including cbc, cmp, tsh, to evaluate other possible causes of edema

## 2017-09-30 NOTE — Patient Instructions (Addendum)
Jamie irrigated your right ear  Decrease amlodipine to 2.5mg . Follow up with me in 1-2 weeks for blood pressure recheck and to check on swelling. You can use lasix for 1-3 days if swelling worsens- ok to hold off otherwise. If you get short of breath or gain 3 lbs in a day or 5 lbs in 3 days- see [place] back immediately. Continue other blood pressure medicines  Please stop by lab before you go

## 2017-09-30 NOTE — Progress Notes (Signed)
Subjective:  William Mahoney is a 84 y.o. year old very pleasant male patient who presents for/with See problem oriented charting ROS- edema noted in legs. No shortness of breath or chest pain. respots some weight gain. Right ear fullness noted.    Past Medical History-  Patient Active Problem List   Diagnosis Date Noted  . Edema 11/10/2016    Priority: High  . Atrial fibrillation ([place]) 12/07/2008    Priority: High  . Insomnia 10/19/2014    Priority: Medium  . Gout 01/09/2014    Priority: Medium  . Malignant neoplasm of rectosigmoid junction ([place]) 01/04/2008    Priority: Medium  . Former smoker 06/23/2007    Priority: Medium  . HYPERLIPIDEMIA 02/09/2007    Priority: Medium  . Essential hypertension 12/07/2006    Priority: Medium  . COPD 12/07/2006    Priority: Medium  . BPH (benign prostatic hyperplasia) 12/07/2006    Priority: Medium  . Allergic rhinitis 08/15/2013    Priority: Low  . 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 Inguinal hernia 03/21/2010    Priority: Low  . BASAL CELL CARCINOMA SKIN LOWER LIMB INCL HIP 03/21/2010    Priority: Low  . Actinic keratosis 12/07/2008    Priority: Low  . OSTEOARTHRITIS 02/09/2007    Priority: Low  . PEPTIC ULCER DISEASE 12/07/2006    Priority: Low  . URINARY INCONTINENCE 12/07/2006    Priority: Low  . PANCREATITIS, HX OF 12/07/2006    Priority: Low  . Thrombocytopenia ([place]) 11/10/2016  . Encounter for therapeutic drug monitoring 08/27/2014    Medications- reviewed and updated Current Outpatient Medications  Medication Sig Dispense Refill  . furosemide (LASIX) 20 MG tablet Take 20 mg by mouth daily.    [name] allopurinol (ZYLOPRIM) 300 MG tablet TAKE ONE-HALF TABLET BY MOUTH IN THE EVENING 45 tablet 5  . amLODipine (NORVASC) 5 MG tablet Take 1 tablet (5 mg total) by mouth daily. 30 tablet 3  . colchicine 0.6 MG tablet Take 0.6 mg by mouth 2 (two) times daily as needed (gout).    . fish oil-omega-3 fatty acids  1000 MG capsule Take 1 g by mouth daily.    . fluticasone (FLONASE) 50 MCG/ACT nasal spray Place 2 sprays into both nostrils daily. 16 g 6  . metoprolol tartrate (LOPRESSOR) 25 MG tablet TAKE 1/2 TABLET BY MOUTH 2 TIMES DAILY 90 tablet 3  . Multiple Vitamin (MULTIVITAMIN) tablet Take 1 tablet by mouth daily.    [name] triamterene-hydrochlorothiazide (MAXZIDE-25) 37.5-25 MG tablet TAKE ONE-HALF TABLET BY MOUTH ONCE DAILY 45 tablet 3  . warfarin (COUMADIN) 2.5 MG tablet TAKE AS DIRECTED BY ANTICOAGULATION CLINIC 40 tablet 3  . zolpidem (AMBIEN) 5 MG tablet TAKE ONE-HALF TABLET BY MOUTH AT BEDTIME AS NEEDED FOR SLEEP 15 tablet 3   No current facility-administered medications for this visit.     Objective: BP 128/76 (BP Location: Left Arm, Patient Position: Sitting, Cuff Size: Normal)   Pulse (!) 52   Temp 97.8 F (36.6 C) (Oral)   Ht 5' 10.5" (1.791 m)   Wt 166 lb 12.8 oz (75.7 kg)   SpO2 100%   BMI 23.60 kg/m  Gen: NAD, resting comfortably TM normal on left. Occluded on right by cerumen prior to irrigation- full view noted after this CV: bradycardic and irregular but with no murmurs rubs or gallops Lungs: CTAB no crackles, wheeze, rhonchi Abdomen: soft/nontender/nondistended/normal bowel sounds.  Ext: 2+ edema Skin: warm, dry  Procedure note: Cerumen noted  in right ear.  Irrigation with water and peroxide performed. Full view of tympanic membrane after procedure.  Patient tolerated procedure well  Assessment/Plan:  Hearing loss of right ear due to cerumen impaction S:Right ear full 3 weeks.  He has had hearing loss and is speaking louder as a result per wife A/P: irrigation completed and hearing restored- patient thankful for Jamie's expertise with this  Essential hypertension Edema S:  patient complains of lower extremity swelling. 2 months ago had amlodipine 5 mg added to his maxzide and metoprolol due to diastolic of 90. Weight up 1 lb from last visit from last visit here. No  shortness of breath or increased fatigue  BP looks good on triamterene-hctz, metoprolol 12.5mg  BID, plus amlodipine 5mg . He has taken several doses of lasix that he had from the past to try to help with the swelling- seems to help some A/P:  We are going to reduce amlodipine to 2.5mg . Can use sparing lasix for increased edema or weight gain. Recheck in a few weeks. Hopeful some improvement with lower dose amlodipine  Will also get some labs including cbc, cmp, tsh, to evaluate other possible causes of edema   Future Appointments  Date Time Provider [place]  10/14/2017  3:30 PM Marin Olp, MD [facility]  10/20/2017 10:45 AM [facility]  11/18/2017 11:00 AM Marin Olp, MD [facility]   Lab/Order associations: Edema, unspecified type - Plan: CBC, Comprehensive metabolic panel, TSH  Return precautions advised.  [name], MD

## 2017-10-01 LAB — COMPREHENSIVE METABOLIC PANEL
ALK PHOS: 72 U/L (ref 39–117)
ALT: 14 U/L (ref 0–53)
AST: 22 U/L (ref 0–37)
Albumin: 4.4 g/dL (ref 3.5–5.2)
BUN: 22 mg/dL (ref 6–23)
CO2: 32 meq/L (ref 19–32)
Calcium: 9.9 mg/dL (ref 8.4–10.5)
Chloride: 99 mEq/L (ref 96–112)
Creatinine, Ser: 1.35 mg/dL (ref 0.40–1.50)
GFR: 52.77 mL/min — ABNORMAL LOW (ref >60.00)
GLUCOSE: 98 mg/dL (ref 70–99)
POTASSIUM: 3.9 meq/L (ref 3.5–5.1)
Sodium: 141 mEq/L (ref 135–145)
Total Bilirubin: 0.7 mg/dL (ref 0.2–1.2)
Total Protein: 7.2 g/dL (ref 6.0–8.3)

## 2017-10-01 LAB — CBC
HEMATOCRIT: 44.7 % (ref 39.0–52.0)
HEMOGLOBIN: 15 g/dL (ref 13.0–17.0)
MCHC: 33.5 g/dL (ref 30.0–36.0)
MCV: 92.3 fl (ref 78.0–100.0)
Platelets: 132 10*3/uL — ABNORMAL LOW (ref 150.0–400.0)
RBC: 4.84 Mil/uL (ref 4.22–5.81)
RDW: 14.7 % (ref 11.5–15.5)
WBC: 6.1 10*3/uL (ref 4.0–10.5)

## 2017-10-01 LAB — TSH: TSH: 5.79 u[IU]/mL — ABNORMAL HIGH (ref 0.35–4.50)

## 2017-10-02 ENCOUNTER — Encounter: Payer: Self-pay | Admitting: Family Medicine

## 2017-10-02 NOTE — Progress Notes (Signed)
No obvious cause of swelling found Your CBC was normal (blood counts, infection fighting cells, platelets) for you with only slightly low platelets.  Your CMET was normal (kidney, liver, and electrolytes, blood sugar) other than some slight worsening of kidney function but yours has fluctuated over last 2 years and I do not think this is the main ankle swelling cause.  Your thyroid was slightly abnormal. If the level gets over 10 I get more concerned- we can repeat with your next labs

## 2017-10-14 ENCOUNTER — Ambulatory Visit: Payer: Medicare Other | Admitting: Family Medicine

## 2017-10-20 ENCOUNTER — Ambulatory Visit: Payer: Medicare Other | Admitting: Family Medicine

## 2017-10-20 ENCOUNTER — Encounter: Payer: Self-pay | Admitting: Family Medicine

## 2017-10-20 ENCOUNTER — Ambulatory Visit (INDEPENDENT_AMBULATORY_CARE_PROVIDER_SITE_OTHER): Payer: Medicare Other | Admitting: General Practice

## 2017-10-20 VITALS — BP 128/82 | HR 51 | Temp 97.8°F | Ht 70.5 in | Wt 162.2 lb

## 2017-10-20 DIAGNOSIS — F5101 Primary insomnia: Secondary | ICD-10-CM | POA: Diagnosis not present

## 2017-10-20 DIAGNOSIS — I10 Essential (primary) hypertension: Secondary | ICD-10-CM

## 2017-10-20 DIAGNOSIS — M1A9XX0 Chronic gout, unspecified, without tophus (tophi): Secondary | ICD-10-CM | POA: Diagnosis not present

## 2017-10-20 DIAGNOSIS — I4891 Unspecified atrial fibrillation: Secondary | ICD-10-CM | POA: Diagnosis not present

## 2017-10-20 DIAGNOSIS — R7989 Other specified abnormal findings of blood chemistry: Secondary | ICD-10-CM | POA: Diagnosis not present

## 2017-10-20 DIAGNOSIS — Z5181 Encounter for therapeutic drug level monitoring: Secondary | ICD-10-CM

## 2017-10-20 LAB — BASIC METABOLIC PANEL
BUN: 23 mg/dL (ref 6–23)
CALCIUM: 9.5 mg/dL (ref 8.4–10.5)
CHLORIDE: 101 meq/L (ref 96–112)
CO2: 31 meq/L (ref 19–32)
Creatinine, Ser: 1.17 mg/dL (ref 0.40–1.50)
GFR: 62.24 mL/min (ref >60.00)
GLUCOSE: 103 mg/dL — AB (ref 70–99)
POTASSIUM: 4.2 meq/L (ref 3.5–5.1)
SODIUM: 140 meq/L (ref 135–145)

## 2017-10-20 LAB — POCT INR: INR: 3 (ref 2.0–3.0)

## 2017-10-20 LAB — TSH: TSH: 5.81 u[IU]/mL — ABNORMAL HIGH (ref 0.35–4.50)

## 2017-10-20 LAB — URIC ACID: URIC ACID, SERUM: 5.6 mg/dL (ref 4.0–7.8)

## 2017-10-20 MED ORDER — AMLODIPINE BESYLATE 2.5 MG PO TABS: 2.5000 mg | ORAL_TABLET | Freq: Every day | ORAL | 3 refills | Status: DC

## 2017-10-20 MED ORDER — ZOLPIDEM TARTRATE 5 MG PO TABS: ORAL_TABLET | ORAL | 3 refills | Status: DC

## 2017-10-20 NOTE — Patient Instructions (Addendum)
Pre visit review using our clinic review tool, if applicable. No additional management support is needed unless otherwise documented below in the visit note.  Hold coumadin today and then continue to take 1 tablet daily except 2 tablets each Monday/Friday.  Repeat INR in 4 weeks.

## 2017-10-20 NOTE — Assessment & Plan Note (Signed)
S: uses ambien 2-3 days a week A/P: continue current rx. Has had no falls but would have to reconsider if occurred

## 2017-10-20 NOTE — Assessment & Plan Note (Signed)
S: 0 flares in years he reports. He remains on allopurinol 150mg  Lab Results  Component Value Date   LABURIC 7.6 11/30/2011  A/P: update uric acid- unlikely to titrate though unless has recurrent flares

## 2017-10-20 NOTE — Assessment & Plan Note (Addendum)
S: controlled on  amlodipine 2.5mg , triamterine-hctz, metoprolol 12.5mg  BID. Edema resolved with reduction of amlodipine to 2.5mg  from 5 mg  Home cuff reasonably validated. Home #s typically in the 130s- occasionally into 140s.  BP Readings from Last 3 Encounters:  10/20/17 128/82.   My reading 128/86. His reading 137/87.   09/30/17 128/76  08/20/17 130/64  A/P: We discussed blood pressure goal of <140/90. Continue current meds:  As long as vast majority of home BPs under 140/90. Home cuff systolic appears slightly above our #s

## 2017-10-20 NOTE — Progress Notes (Signed)
Subjective:  William Mahoney is a 85 y.o. year old very pleasant male patient who presents for/with See problem oriented charting ROS- swelling improved. No reported chest pain, shortness of breath. No cough.    Past Medical History-  Patient Active Problem List   Diagnosis Date Noted  . Edema 11/10/2016    Priority: High  . Atrial fibrillation ([place]) 12/07/2008    Priority: High  . Insomnia 10/19/2014    Priority: Medium  . Gout 01/09/2014    Priority: Medium  . Malignant neoplasm of rectosigmoid junction ([place]) 01/04/2008    Priority: Medium  . Former smoker 06/23/2007    Priority: Medium  . HYPERLIPIDEMIA 02/09/2007    Priority: Medium  . Essential hypertension 12/07/2006    Priority: Medium  . COPD 12/07/2006    Priority: Medium  . BPH (benign prostatic hyperplasia) 12/07/2006    Priority: Medium  . Allergic rhinitis 08/15/2013    Priority: Low  . 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 Inguinal hernia 03/21/2010    Priority: Low  . BASAL CELL CARCINOMA SKIN LOWER LIMB INCL HIP 03/21/2010    Priority: Low  . Actinic keratosis 12/07/2008    Priority: Low  . OSTEOARTHRITIS 02/09/2007    Priority: Low  . PEPTIC ULCER DISEASE 12/07/2006    Priority: Low  . URINARY INCONTINENCE 12/07/2006    Priority: Low  . PANCREATITIS, HX OF 12/07/2006    Priority: Low  . Thrombocytopenia ([place]) 11/10/2016  . Encounter for therapeutic drug monitoring 08/27/2014    Medications- reviewed and updated Current Outpatient Medications  Medication Sig Dispense Refill  . allopurinol (ZYLOPRIM) 300 MG tablet TAKE ONE-HALF TABLET BY MOUTH IN THE EVENING 45 tablet 5  . amLODipine (NORVASC) 5 MG tablet Take 1 tablet (5 mg total) by mouth daily. 30 tablet 3  . colchicine 0.6 MG tablet Take 0.6 mg by mouth 2 (two) times daily as needed (gout).    . fish oil-omega-3 fatty acids 1000 MG capsule Take 1 g by mouth daily.    . fluticasone (FLONASE) 50 MCG/ACT nasal spray Place 2  sprays into both nostrils daily. 16 g 6  . furosemide (LASIX) 20 MG tablet Take 20 mg by mouth daily.    . metoprolol tartrate (LOPRESSOR) 25 MG tablet TAKE 1/2 TABLET BY MOUTH 2 TIMES DAILY 90 tablet 3  . Multiple Vitamin (MULTIVITAMIN) tablet Take 1 tablet by mouth daily.    [name] triamterene-hydrochlorothiazide (MAXZIDE-25) 37.5-25 MG tablet TAKE ONE-HALF TABLET BY MOUTH ONCE DAILY 45 tablet 3  . warfarin (COUMADIN) 2.5 MG tablet TAKE AS DIRECTED BY ANTICOAGULATION CLINIC 40 tablet 3  . zolpidem (AMBIEN) 5 MG tablet TAKE ONE-HALF TABLET BY MOUTH AT BEDTIME AS NEEDED FOR SLEEP 15 tablet 3   Objective: BP 128/82 (BP Location: Left Arm, Patient Position: Sitting, Cuff Size: Normal)   Pulse (!) 51   Temp 97.8 F (36.6 C) (Oral)   Ht 5' 10.5" (1.791 m)   Wt 162 lb 3.2 oz (73.6 kg)   SpO2 98%   BMI 22.94 kg/m  Gen: NAD, resting comfortably CV: irregularly irregular and bradycardic no murmurs rubs or gallops Lungs: CTAB no crackles, wheeze, rhonchi Abdomen: soft/nontender/nondistended/normal bowel sounds.  Ext: no edema Skin: warm, dry  Assessment/Plan:  Other notes: 1.mild TSH elevation last labs- update again today 2. Monitor creatinine-  This could be CKD III  Essential hypertension S: controlled on  amlodipine 2.5mg , triamterine-hctz, metoprolol 12.5mg  BID. Edema resolved with reduction of amlodipine to  2.5mg  from 5 mg  Home cuff reasonably validated. Home #s typically in the 130s- occasionally into 140s.  BP Readings from Last 3 Encounters:  10/20/17 128/82.   My reading 128/86. His reading 137/87.   09/30/17 128/76  08/20/17 130/64  A/P: We discussed blood pressure goal of <140/90. Continue current meds:  As long as vast majority of home BPs under 140/90. Home cuff systolic appears slightly above our #s  Gout S: 0 flares in years he reports. He remains on allopurinol 150mg  Lab Results  Component Value Date   LABURIC 7.6 11/30/2011  A/P: update uric acid- unlikely to  titrate though unless has recurrent flares  Insomnia S: uses ambien 2-3 days a week A/P: continue current rx. Has had no falls but would have to reconsider if occurred    Future Appointments  Date Time Provider [place]  11/18/2017 11:00 AM Marin Olp, MD [facility]  11/24/2017 11:00 AM [facility]   No follow-ups on file.  Lab/Order associations: Chronic gout without tophus, unspecified cause, unspecified site - Plan: Uric acid  Elevated TSH - Plan: TSH  Blood creatinine increased compared with prior measurement - Plan: Basic metabolic panel  Meds ordered this encounter  Medications  . zolpidem (AMBIEN) 5 MG tablet    Sig: TAKE ONE-HALF TABLET BY MOUTH AT BEDTIME AS NEEDED FOR SLEEP    Dispense:  15 tablet    Refill:  3  . amLODipine (NORVASC) 2.5 MG tablet    Sig: Take 1 tablet (2.5 mg total) by mouth daily.    Dispense:  90 tablet    Refill:  3    Return precautions advised.  [name], MD

## 2017-10-20 NOTE — Progress Notes (Signed)
I have reviewed and agree with note, evaluation, plan.   Boomer Winders, MD  

## 2017-10-20 NOTE — Patient Instructions (Addendum)
As long as blood pressure in 130s for the most part and no or minimal ankle swelling- lets continue current medicine  Please check with your pharmacy to see if they have the shingrix vaccine. If they do- please get this immunization and update [place] by phone call or mychart with dates you receive the vaccine  Please stop by the lab before you go.

## 2017-11-18 ENCOUNTER — Ambulatory Visit: Payer: Medicare Other | Admitting: Family Medicine

## 2017-11-24 ENCOUNTER — Ambulatory Visit: Payer: Medicare Other

## 2017-12-05 ENCOUNTER — Other Ambulatory Visit: Payer: Self-pay | Admitting: Family Medicine

## 2017-12-06 ENCOUNTER — Other Ambulatory Visit: Payer: Self-pay | Admitting: General Practice

## 2017-12-06 MED ORDER — WARFARIN SODIUM 2.5 MG PO TABS: ORAL_TABLET | ORAL | 3 refills | Status: DC

## 2017-12-07 ENCOUNTER — Ambulatory Visit: Payer: Medicare Other | Admitting: Family Medicine

## 2017-12-08 ENCOUNTER — Ambulatory Visit (INDEPENDENT_AMBULATORY_CARE_PROVIDER_SITE_OTHER): Payer: Medicare Other | Admitting: General Practice

## 2017-12-08 DIAGNOSIS — Z5181 Encounter for therapeutic drug level monitoring: Secondary | ICD-10-CM | POA: Diagnosis not present

## 2017-12-08 DIAGNOSIS — I4891 Unspecified atrial fibrillation: Secondary | ICD-10-CM

## 2017-12-08 LAB — POCT INR: INR: 2.8 (ref 2.0–3.0)

## 2017-12-08 NOTE — Patient Instructions (Addendum)
Pre visit review using our clinic review tool, if applicable. No additional management support is needed unless otherwise documented below in the visit note.   Continue to take 1 tablet daily except 2 tablets each Monday/Friday.  Repeat INR in 6 weeks.  

## 2017-12-08 NOTE — Progress Notes (Signed)
I have reviewed and agree with note, evaluation, plan.   Marinda Tyer, MD  

## 2017-12-14 ENCOUNTER — Telehealth: Payer: Self-pay | Admitting: Family Medicine

## 2017-12-14 MED ORDER — METOPROLOL TARTRATE 25 MG PO TABS: ORAL_TABLET | ORAL | 3 refills | Status: DC

## 2017-12-14 NOTE — Telephone Encounter (Signed)
Copied from [place] [phone]. Topic: Quick Communication - Rx Refill/Question >> Dec 14, 2017  4:21 PM Neva Seat wrote: metoprolol tartrate (LOPRESSOR) 25 MG tablet  Needing refills - pt is out and going on vacation in a couple of days.  Needing the refill.    [place], [place] - [address] [phone] (Phone) [phone] (Fax)

## 2017-12-27 ENCOUNTER — Encounter: Payer: Self-pay | Admitting: Family Medicine

## 2017-12-27 ENCOUNTER — Ambulatory Visit (INDEPENDENT_AMBULATORY_CARE_PROVIDER_SITE_OTHER): Payer: Medicare Other | Admitting: Family Medicine

## 2017-12-27 VITALS — BP 118/76 | HR 50 | Temp 97.9°F | Ht 71.0 in | Wt 160.2 lb

## 2017-12-27 DIAGNOSIS — I4891 Unspecified atrial fibrillation: Secondary | ICD-10-CM

## 2017-12-27 DIAGNOSIS — M1A9XX0 Chronic gout, unspecified, without tophus (tophi): Secondary | ICD-10-CM

## 2017-12-27 DIAGNOSIS — J301 Allergic rhinitis due to pollen: Secondary | ICD-10-CM

## 2017-12-27 DIAGNOSIS — I10 Essential (primary) hypertension: Secondary | ICD-10-CM

## 2017-12-27 MED ORDER — FLUTICASONE PROPIONATE 50 MCG/ACT NA SUSP
2.0000 | Freq: Every day | NASAL | 6 refills | Status: DC
Start: 2017-12-27 — End: 2019-03-30

## 2017-12-27 NOTE — Progress Notes (Signed)
Subjective:  William Mahoney is a 84 y.o. year old very pleasant male patient who presents for/with See problem oriented charting ROS- some runny nose and watery itchy eyes. Denies chest pain. Occasional edema- none at present.    Past Medical History-  Patient Active Problem List   Diagnosis Date Noted  . Edema 11/10/2016    Priority: High  . Atrial fibrillation ([place]) 12/07/2008    Priority: High  . Insomnia 10/19/2014    Priority: Medium  . Gout 01/09/2014    Priority: Medium  . 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 Former smoker 06/23/2007    Priority: Medium  . HYPERLIPIDEMIA 02/09/2007    Priority: Medium  . Essential hypertension 12/07/2006    Priority: Medium  . COPD 12/07/2006    Priority: Medium  . BPH (benign prostatic hyperplasia) 12/07/2006    Priority: Medium  . Allergic rhinitis 08/15/2013    Priority: Low  . 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 Inguinal hernia 03/21/2010    Priority: Low  . BASAL CELL CARCINOMA SKIN LOWER LIMB INCL HIP 03/21/2010    Priority: Low  . Actinic keratosis 12/07/2008    Priority: Low  . OSTEOARTHRITIS 02/09/2007    Priority: Low  . PEPTIC ULCER DISEASE 12/07/2006    Priority: Low  . URINARY INCONTINENCE 12/07/2006    Priority: Low  . PANCREATITIS, HX OF 12/07/2006    Priority: Low  . Thrombocytopenia ([place]) 11/10/2016  . Encounter for therapeutic drug monitoring 08/27/2014    Medications- reviewed and updated Current Outpatient Medications  Medication Sig Dispense Refill  . allopurinol (ZYLOPRIM) 300 MG tablet TAKE ONE-HALF TABLET BY MOUTH IN THE EVENING 45 tablet 5  . amLODipine (NORVASC) 2.5 MG tablet Take 1 tablet (2.5 mg total) by mouth daily. 90 tablet 3  . colchicine 0.6 MG tablet Take 0.6 mg by mouth 2 (two) times daily as needed (gout).    . fish oil-omega-3 fatty acids 1000 MG capsule Take 1 g by mouth daily.    . fluticasone (FLONASE) 50 MCG/ACT  nasal spray Place 2 sprays into both nostrils daily. 16 g 6  . furosemide (LASIX) 20 MG tablet Take 20 mg by mouth daily.    . metoprolol tartrate (LOPRESSOR) 25 MG tablet TAKE 1/2 TABLET BY MOUTH 2 TIMES DAILY 90 tablet 3  . Multiple Vitamin (MULTIVITAMIN) tablet Take 1 tablet by mouth daily.    [name] triamterene-hydrochlorothiazide (MAXZIDE-25) 37.5-25 MG tablet TAKE ONE-HALF TABLET BY MOUTH ONCE DAILY 45 tablet 3  . warfarin (COUMADIN) 2.5 MG tablet TAKE AS DIRECTED BY ANTICOAGULATION CLINIC 40 tablet 3  . zolpidem (AMBIEN) 5 MG tablet TAKE ONE-HALF TABLET BY MOUTH AT BEDTIME AS NEEDED FOR SLEEP 15 tablet 3   No current facility-administered medications for this visit.    Objective: BP 118/76 (BP Location: Left Arm, Patient Position: Sitting, Cuff Size: Normal)   Pulse (!) 50   Temp 97.9 F (36.6 C) (Oral)   Ht 5\' 11"  (1.803 m)   Wt 160 lb 3.2 oz (72.7 kg)   SpO2 98%   BMI 22.34 kg/m  Gen: NAD, resting comfortably CV: bradycardic no murmurs rubs or gallops Lungs: CTAB no crackles, wheeze, rhonchi Abdomen: soft/nontender/nondistended/normal bowel sounds.  Ext: no edema Skin: warm, dry  Assessment/Plan:  Essential hypertension S: controlled on amlodipine 2.5mg , lasix 20mg  sparingly for edema, maxzide 25mg , metoprolol 12.5mg  BID.   Some edema with travel and took a few days  of lasix and resolved BP Readings from Last 3 Encounters:  12/27/17 118/76  10/20/17 128/82  09/30/17 128/76  A/P: We discussed blood pressure goal of <140/90. Continue current meds  Gout S:  no gout flares in sometime  On allopurinol 150mg . Even with HCTZ on board A/P: continue current rx  Atrial fibrillation S: remains on coumadin- rate controlled on metoprool A/P: anticoagulated and on rate control - continue current rx  Allergic rhinitis S: allergies have been bad lately- nasal spray not helping a ton at 1 spray each nostril daily A/P: we will trial two sprays each nostril of flonase instead of 1  to see if that will benefit him further  Future Appointments  Date Time Provider [place]  01/19/2018  9:30 AM [facility]   Return in about 6 months (around 06/27/2018) for physical.  Lab/Order associations: Essential hypertension  Chronic gout without tophus, unspecified cause, unspecified site  Atrial fibrillation, unspecified type ([place])  Seasonal allergic rhinitis due to pollen  Meds ordered this encounter  Medications  . fluticasone (FLONASE) 50 MCG/ACT nasal spray    Sig: Place 2 sprays into both nostrils daily.    Dispense:  16 g    Refill:  6   Return precautions advised.  [name], MD

## 2017-12-27 NOTE — Assessment & Plan Note (Signed)
S:  no gout flares in sometime  On allopurinol 150mg . Even with HCTZ on board A/P: continue current rx

## 2017-12-27 NOTE — Patient Instructions (Addendum)
we will trial two sprays each nostril of flonase instead of 1 to see if that will benefit him further  No changes today  Try to schedule a flu shot in October or November  6 months for physical

## 2017-12-27 NOTE — Assessment & Plan Note (Signed)
S: allergies have been bad lately- nasal spray not helping a ton at 1 spray each nostril daily A/P: we will trial two sprays each nostril of flonase instead of 1 to see if that will benefit him further

## 2017-12-27 NOTE — Assessment & Plan Note (Signed)
S: controlled on amlodipine 2.5mg , lasix 20mg  sparingly for edema, maxzide 25mg , metoprolol 12.5mg  BID.   Some edema with travel and took a few days of lasix and resolved BP Readings from Last 3 Encounters:  12/27/17 118/76  10/20/17 128/82  09/30/17 128/76  A/P: We discussed blood pressure goal of <140/90. Continue current meds

## 2017-12-27 NOTE — Assessment & Plan Note (Signed)
S: remains on coumadin- rate controlled on metoprool A/P: anticoagulated and on rate control - continue current rx

## 2018-01-09 ENCOUNTER — Emergency Department (HOSPITAL_COMMUNITY)
Admission: EM | Admit: 2018-01-09 | Discharge: 2018-01-09 | Disposition: A | Discharge status: Home / Self Care | Payer: Medicare Other | Attending: Emergency Medicine | Admitting: Emergency Medicine

## 2018-01-09 ENCOUNTER — Encounter (HOSPITAL_COMMUNITY): Payer: Self-pay | Admitting: Emergency Medicine

## 2018-01-09 ENCOUNTER — Emergency Department (HOSPITAL_COMMUNITY): Payer: Medicare Other

## 2018-01-09 DIAGNOSIS — J449 Chronic obstructive pulmonary disease, unspecified: Secondary | ICD-10-CM | POA: Diagnosis not present

## 2018-01-09 DIAGNOSIS — Y9339 Activity, other involving climbing, rappelling and jumping off: Secondary | ICD-10-CM | POA: Insufficient documentation

## 2018-01-09 DIAGNOSIS — S0990XA Unspecified injury of head, initial encounter: Secondary | ICD-10-CM | POA: Diagnosis present

## 2018-01-09 DIAGNOSIS — W19XXXA Unspecified fall, initial encounter: Secondary | ICD-10-CM

## 2018-01-09 DIAGNOSIS — S0003XA Contusion of scalp, initial encounter: Secondary | ICD-10-CM | POA: Insufficient documentation

## 2018-01-09 DIAGNOSIS — Y92009 Unspecified place in unspecified non-institutional (private) residence as the place of occurrence of the external cause: Secondary | ICD-10-CM | POA: Insufficient documentation

## 2018-01-09 DIAGNOSIS — Y999 Unspecified external cause status: Secondary | ICD-10-CM | POA: Diagnosis not present

## 2018-01-09 DIAGNOSIS — Z23 Encounter for immunization: Secondary | ICD-10-CM | POA: Insufficient documentation

## 2018-01-09 DIAGNOSIS — S51812A Laceration without foreign body of left forearm, initial encounter: Secondary | ICD-10-CM | POA: Insufficient documentation

## 2018-01-09 DIAGNOSIS — W11XXXA Fall on and from ladder, initial encounter: Secondary | ICD-10-CM | POA: Insufficient documentation

## 2018-01-09 DIAGNOSIS — Z87891 Personal history of nicotine dependence: Secondary | ICD-10-CM | POA: Diagnosis not present

## 2018-01-09 MED ORDER — TETANUS-DIPHTH-ACELL PERTUSSIS 5-2.5-18.5 LF-MCG/0.5 IM SUSP
0.5000 mL | Freq: Once | INTRAMUSCULAR | Status: AC
  Administered 2018-01-09: 0.5 mL via INTRAMUSCULAR
  Filled 2018-01-09: qty 0.5

## 2018-01-09 MED ORDER — ACETAMINOPHEN 325 MG PO TABS
650.0000 mg | ORAL_TABLET | Freq: Once | ORAL | Status: AC
  Administered 2018-01-09: 650 mg via ORAL
  Filled 2018-01-09: qty 2

## 2018-01-09 NOTE — ED Provider Notes (Signed)
[facility] Emergency Department Provider Note MRN:  [id number]  Arrival date & time: 01/09/18     Chief Complaint   Fall   History of Present Illness   William Mahoney is a 84 y.o. year-old male with a history of A. fib, COPD presenting to the ED with chief complaint of fall.  Shortly prior to arrival, patient was on a stepladder "getting some work done around the house".  He was stepping down from the ladder and missed the last step, falling backwards onto the hardwood floor.  Hit his head, no loss of consciousness, but felt "woozy" and nauseated immediately after the fall.  Denies neck pain, no chest pain or shortness of breath, no abdominal pain, no pain to the hips or legs, skin tear to the left forearm but otherwise no complaints.  Patient takes blood thinners.  Denies chest pain or dizziness prior to the fall.  Review of Systems  A complete 10 system review of systems was obtained and all systems are negative except as noted in the HPI and PMH.   Patient's Health History    Past Medical History:  Diagnosis Date  . BPH (benign prostatic hypertrophy)   . Chronic atrial fibrillation [facility])    cardiologist--   dr berry  . COPD (chronic obstructive pulmonary disease) ([place])   . Diverticulosis of colon    MODERATE LEFT SIDE  . Epididymal cyst    w/ epididymalitis  . Full dentures   . 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 Hearing loss   . History of alcohol abuse    quit drinking in the mid 80's  . History of cirrhosis of liver    alcoholic--  hx alcohol abuse -- quit drinking 1980's  . History of gout   . History of melanoma excision    2013--  left ear lobe and back of hand  . History of pancreatitis    2008  . History of rectal cancer oncologist-  dr Benay Spice--  no recurrence   dx Sept 2009--  Stage II (T3N0)  s/p  sigmoid colectomy & low anterior resection 04-18-2008  and chemoradiation 2010  . History of shingles  07/27/2010  . History of squamous cell carcinoma excision    left lower leg  . Hyperlipidemia   . Hypertension   . Loose stools    DUE TO ANTIBIOTICS  . Macular degeneration    not sure which eye  . OA (osteoarthritis)   . RBBB (right bundle branch block)   . Renal lesion    chronic-- left side  . Sciatica of right side   . Urge urinary incontinence    intermittant    Past Surgical History:  Procedure Laterality Date  . CARDIAC CATHETERIZATION  2001  approx.  in [place]  . CATARACT EXTRACTION W/ INTRAOCULAR LENS  IMPLANT, BILATERAL    . COLONOSCOPY  last one 06-27-2012  . EXPLORATORY LAPAROTOMY/  LOW ANTERIOR RESECTION/  SIGMOID COLECTOMY  04-18-2008   DR Dalbert Batman  . INCISION AND DRAINAGE ABSCESS N/A 11/02/2014   Procedure: INCISION AND DRAINAGE OF SCROTUM;  Surgeon: Ardis Hughs, MD;  Location: [facility];  Service: Urology;  Laterality: N/A;  . INGUINAL HERNIA REPAIR Right 03/23/2014   Procedure: HERNIA REPAIR INGUINAL ADULT OPEN REPAIR RIGHT INGUINAL HERNIA REPAIR;  Surgeon: Fanny Skates, MD;  Location: [place];  Service: General;  Laterality: Right;  . INSERTION OF MESH Right 03/23/2014  Procedure: INSERTION OF MESH;  Surgeon: Fanny Skates, MD;  Location: [place];  Service: General;  Laterality: Right;  . LAPAROSCOPIC CHOLECYSTECTOMY  1990's  . MASS EXCISION N/A 11/02/2014   Procedure: EXCISION OF EPIDIDYMAL CYST;  Surgeon: Ardis Hughs, MD;  [facility]  Service: Urology;  Laterality: N/A;  . TONSILLECTOMY  as child  . TRANSTHORACIC ECHOCARDIOGRAM  05-11-2008   pseudonormal LV filling pattern,  ef 60-65%/  mild to moderate MV calcification no stenosis w/ mild to moderate regurg./  mild LAE and RAE/  mild TR    Family History  Problem Relation Age of Onset  . Hypertension Mother   . Lymphoma Father   . Lymphoma Unknown   . Stroke Unknown        1st degree relative  . Colon cancer Neg Hx   . Esophageal cancer Neg Hx   .  Rectal cancer Neg Hx   . Stomach cancer Neg Hx     Social History   Socioeconomic History  . Marital status: Married    Spouse name: Not on file  . Number of children: 6  . Years of education: Not on file  . Highest education level: Not on file  Occupational History  . Occupation: Retired    Fish farm manager: RETIRED  Social Needs  . Financial resource strain: Not on file  . Food insecurity:    Worry: Not on file    Inability: Not on file  . Transportation needs:    Medical: Not on file    Non-medical: Not on file  Tobacco Use  . Smoking status: Former Smoker    Packs/day: 1.00    Years: 55.00    Pack years: 55.00    Types: Cigarettes    Last attempt to quit: 03/20/2008    Years since quitting: 9.8  . Smokeless tobacco: Never Used  Substance and Sexual Activity  . Alcohol use: No    Comment: hx alcohol abuse -- quit in 1980's   . Drug use: No  . Sexual activity: Not on file  Lifestyle  . Physical activity:    Days per week: Not on file    Minutes per session: Not on file  . Stress: Not on file  Relationships  . Social connections:    Talks on phone: Not on file    Gets together: Not on file    Attends religious service: Not on file    Active member of club or organization: Not on file    Attends meetings of clubs or organizations: Not on file    Relationship status: Not on file  . Intimate partner violence:    Fear of current or ex partner: Not on file    Emotionally abused: Not on file    Physically abused: Not on file    Forced sexual activity: Not on file  Other Topics Concern  . Not on file  Social History Narrative   Married. 6 kids. Wife William Mahoney also a patient of Dr. Ronney Lion.       Retired.      Physical Exam  Vital Signs and Nursing Notes reviewed Vitals:   01/09/18 1200 01/09/18 1300  BP: 136/74 (!) 136/95  Pulse: (!) 49 (!) 53  Resp: 16 20  Temp:    SpO2: 99% 98%    CONSTITUTIONAL: Well-appearing, NAD NEURO:  Alert and oriented x 3, no focal  deficits EYES:  eyes equal and reactive ENT/NECK:  no LAD, no JVD CARDIO: Regular rate,  well-perfused, normal S1 and S2 PULM:  CTAB no wheezing or rhonchi GI/GU:  normal bowel sounds, non-distended, non-tender MSK/SPINE:  No gross deformities, no edema SKIN:  no rash, skin tear to the left forearm, minor abrasion to the scalp at the vertex. PSYCH:  Appropriate speech and behavior  Diagnostic and Interventional Summary    EKG Interpretation  Date/Time:    Ventricular Rate:    PR Interval:    QRS Duration:   QT Interval:    QTC Calculation:   R Axis:     Text Interpretation:        Labs Reviewed - No data to display  CT HEAD WO CONTRAST  Final Result      Medications  Tdap (BOOSTRIX) injection 0.5 mL (0.5 mLs Intramuscular Given 01/09/18 1202)  acetaminophen (TYLENOL) tablet 650 mg (650 mg Oral Given 01/09/18 1202)     Procedures Critical Care  ED Course and Medical Decision Making  I have reviewed the triage vital signs and the nursing notes.  Pertinent labs & imaging results that were available during my care of the patient were reviewed by me and considered in my medical decision making (see below for details).  Mild head trauma in this 50-year-old male with fall down one step of a stepladder.  Exam otherwise none concerning for significant traumatic injuries.  Will update tetanus, CT head.  CT head unremarkable, patient continues to feel well, ambulating appropriately in the emergency department.  Will be closely monitored by family over the next 24 hours, family made aware of the small possibility of delayed bleeding given his anticoagulant use.  After the discussed management above, the patient was determined to be safe for discharge.  The patient was in agreement with this plan and all questions regarding their care were answered.  ED return precautions were discussed and the patient will return to the ED with any significant worsening of condition.  Barth Kirks.  Sedonia Small, MD [place] [email or web address]  Final Clinical Impressions(s) / ED Diagnoses     ICD-10-CM   1. Fall, initial encounter W19.XXXA   2. Contusion of scalp, initial encounter S00.03XA   3. Skin tear of left forearm without complication, initial encounter L84.536I     ED Discharge Orders    None         Maudie Flakes, MD 01/09/18 1348

## 2018-01-09 NOTE — Discharge Instructions (Addendum)
You were evaluated in the Emergency Department and after careful evaluation, we did not find any emergent condition requiring admission or further testing in the hospital. ° °Please return to the Emergency Department if you experience any worsening of your condition.  We encourage you to follow up with a primary care provider.  Thank you for allowing us to be a part of your care. °

## 2018-01-09 NOTE — ED Notes (Signed)
Discharge instructions discussed with Pt. Pt verbalized understanding. Pt stable and ambulatory.    

## 2018-01-09 NOTE — ED Notes (Signed)
Patient ambulated in hallway with no complaints of dizziness. Patient ambulated with a steady gait and 02 saturation remained above 94%

## 2018-01-09 NOTE — ED Triage Notes (Signed)
Pt arrives via EMS from home with reports of fall from a 97ft step ladder. Pt was climbing down and missed a step, fell on hard wood floor. Pt did hit his head and approx 2inch skin tear to L arm. Pt on coumadin. Denies LOC. Alert oriented in NAD. HR 50's, BP 136/62, RR 16, 95% ra, cbg 112.

## 2018-01-09 NOTE — ED Notes (Addendum)
Gauze and xeroform applied to elbow wound and back.

## 2018-01-13 ENCOUNTER — Ambulatory Visit (INDEPENDENT_AMBULATORY_CARE_PROVIDER_SITE_OTHER): Payer: Medicare Other

## 2018-01-13 ENCOUNTER — Encounter: Payer: Self-pay | Admitting: Family Medicine

## 2018-01-13 ENCOUNTER — Ambulatory Visit (INDEPENDENT_AMBULATORY_CARE_PROVIDER_SITE_OTHER): Payer: Medicare Other | Admitting: Family Medicine

## 2018-01-13 VITALS — BP 108/78 | HR 47 | Temp 97.7°F | Ht 71.0 in | Wt 159.4 lb

## 2018-01-13 DIAGNOSIS — M79632 Pain in left forearm: Secondary | ICD-10-CM

## 2018-01-13 DIAGNOSIS — S40212A Abrasion of left shoulder, initial encounter: Secondary | ICD-10-CM

## 2018-01-13 DIAGNOSIS — S51002A Unspecified open wound of left elbow, initial encounter: Secondary | ICD-10-CM

## 2018-01-13 DIAGNOSIS — W19XXXA Unspecified fall, initial encounter: Secondary | ICD-10-CM

## 2018-01-13 DIAGNOSIS — S61217A Laceration without foreign body of left little finger without damage to nail, initial encounter: Secondary | ICD-10-CM

## 2018-01-13 MED ORDER — TRAMADOL HCL 50 MG PO TABS: 50.0000 mg | ORAL_TABLET | Freq: Three times a day (TID) | ORAL | 0 refills | Status: DC | PRN

## 2018-01-13 NOTE — Progress Notes (Signed)
Subjective:  William Mahoney is a 84 y.o. year old very pleasant male patient who presents for/with See problem oriented charting ROS- no fever, chills. Mild tenderness on scalp if presses but no regular headache. Has left elbow pain   Past Medical History-  Patient Active Problem List   Diagnosis Date Noted  . Edema 11/10/2016    Priority: High  . Atrial fibrillation ([place]) 12/07/2008    Priority: High  . Insomnia 10/19/2014    Priority: Medium  . Gout 01/09/2014    Priority: Medium  . 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 Former smoker 06/23/2007    Priority: Medium  . HYPERLIPIDEMIA 02/09/2007    Priority: Medium  . Essential hypertension 12/07/2006    Priority: Medium  . COPD 12/07/2006    Priority: Medium  . BPH (benign prostatic hyperplasia) 12/07/2006    Priority: Medium  . Allergic rhinitis 08/15/2013    Priority: Low  . 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 Inguinal hernia 03/21/2010    Priority: Low  . BASAL CELL CARCINOMA SKIN LOWER LIMB INCL HIP 03/21/2010    Priority: Low  . Actinic keratosis 12/07/2008    Priority: Low  . OSTEOARTHRITIS 02/09/2007    Priority: Low  . PEPTIC ULCER DISEASE 12/07/2006    Priority: Low  . URINARY INCONTINENCE 12/07/2006    Priority: Low  . PANCREATITIS, HX OF 12/07/2006    Priority: Low  . Thrombocytopenia ([place]) 11/10/2016  . Encounter for therapeutic drug monitoring 08/27/2014    Medications- reviewed and updated Current Outpatient Medications  Medication Sig Dispense Refill  . allopurinol (ZYLOPRIM) 300 MG tablet TAKE ONE-HALF TABLET BY MOUTH IN THE EVENING 45 tablet 5  . amLODipine (NORVASC) 2.5 MG tablet Take 1 tablet (2.5 mg total) by mouth daily. 90 tablet 3  . colchicine 0.6 MG tablet Take 0.6 mg by mouth 2 (two) times daily as needed (gout).    . fish oil-omega-3 fatty acids 1000 MG capsule Take 1 g by mouth daily.    . fluticasone (FLONASE) 50  MCG/ACT nasal spray Place 2 sprays into both nostrils daily. 16 g 6  . furosemide (LASIX) 20 MG tablet Take 20 mg by mouth daily.    . metoprolol tartrate (LOPRESSOR) 25 MG tablet TAKE 1/2 TABLET BY MOUTH 2 TIMES DAILY 90 tablet 3  . Multiple Vitamin (MULTIVITAMIN) tablet Take 1 tablet by mouth daily.    [name] triamterene-hydrochlorothiazide (MAXZIDE-25) 37.5-25 MG tablet TAKE ONE-HALF TABLET BY MOUTH ONCE DAILY 45 tablet 3  . warfarin (COUMADIN) 2.5 MG tablet TAKE AS DIRECTED BY ANTICOAGULATION CLINIC 40 tablet 3  . zolpidem (AMBIEN) 5 MG tablet TAKE ONE-HALF TABLET BY MOUTH AT BEDTIME AS NEEDED FOR SLEEP 15 tablet 3  . traMADol (ULTRAM) 50 MG tablet Take 1 tablet (50 mg total) by mouth every 8 (eight) hours as needed for moderate pain or severe pain (dont drive for 8 hours after atking). 20 tablet 0   No current facility-administered medications for this visit.     Objective: BP 108/78 (BP Location: Left Arm, Patient Position: Sitting, Cuff Size: Large)   Pulse (!) 47   Temp 97.7 F (36.5 C) (Oral)   Ht 5\' 11"  (1.803 m)   Wt 159 lb 6.4 oz (72.3 kg)   SpO2 98%   BMI 22.23 kg/m  Gen: NAD, resting comfortably CV: irregularly irregular and bradycardic, no murmurs rubs or gallops Lungs: CTAB no crackles, wheeze, rhonchi  Abdomen: soft/nontender/nondistended/normal bowel sounds.  Ext: no edema Skin: warm, dry MSK: skin tear in triangular shape left elbow. Small abrasion on left shoulder, small laceration left pinky finger. Pictured skin tear on elbow below. Tender around this     Dg Forearm Left  Result Date: 01/13/2018 CLINICAL DATA:  [place] with left forearm pain EXAM: LEFT FOREARM - 2 VIEW COMPARISON:  None FINDINGS: The bones of the left forearm are osteopenic. However no acute fracture is seen. Both the radius and ulna are intact. What is seen of the left elbow joint and left wrist joint is unremarkable. IMPRESSION: Osteopenia.  No acute fracture. Electronically Signed   By: Ivar Drape  M.D.   On: 01/13/2018 15:40   Ct Head Wo Contrast  Result Date: 01/09/2018 CLINICAL DATA:  [place] from a 3 foot stepladder. Patient hit is head with approximately 2 a skin tear to the left arm. Patient on Coumadin. EXAM: CT HEAD WITHOUT CONTRAST TECHNIQUE: Contiguous axial images were obtained from the base of the skull through the vertex without intravenous contrast. COMPARISON:  01/10/2011 FINDINGS: Brain: No evidence of acute infarction, hemorrhage, hydrocephalus, extra-axial collection or mass lesion/mass effect. Patchy white matter hypoattenuation is noted consistent with mild to moderate chronic microvascular ischemic change. Small old lacunar infarcts are suggested in the basal ganglia. Vascular: No hyperdense vessel or unexpected calcification. Skull: Normal. Negative for fracture or focal lesion. Sinuses/Orbits: Globes and orbits are unremarkable. Sinuses and mastoid air cells are clear. Other: None. IMPRESSION: 1. No acute intracranial abnormalities. 2. Chronic microvascular ischemic change. Small old basal ganglia lacune infarcts. Electronically Signed   By: Lajean Manes M.D.   On: 01/09/2018 12:52    Assessment/Plan:  ER follow-up after fall. Continued left forearm pain s: On September 22 patient was on a stepladder try to get some more pain around the house.  When he went to step down from the ladder, he missed the last step which led to him falling backwards onto the hardwood floor.  He did hit his head but did not lose consciousness.  Had some nausea and lightheadedness after fall.  He experienced a skin tear/laceration of the left forearm.  Patient is on Coumadin and fortunately knew to go straight to the emergency room.  His tetanus shot was updated.  He had a CT of his head which was unremarkable.  He was able to ambulate in the emergency room.  Possibility of delayed bleeding was discussed with patient the risk is low.  INR at that time was therapeutic at 2.8  Today, he admits hasnt  changed any of the dressings yet- we changed all 3- left shoulder, left elbow, left pinky. Complaining of moderate to severe pain in left arm- holds it as if needs to be in a sling. No headaches, no vomiting. Hard to sleep at night due to the pain even with the ambien unable to sleep. Tylenol before bed has not been enough.  A/P: patient sustained serious fall and luckily had great care in the ER afterwards. Main question from family today was about pain in forearm and if there could be a fracture- we updated films today and no fracture found. He is having trouble using the arm due to pain and advise no driving until this resolved. Pain is not adequately controlled with OTC pain options- I wrote an rx for tramadol at least to help him rest.   Patient aware of risks of delayed bleed but lucily no signs of that. We also reviewed wound  care (hadnt changed dressing in 4 days- we gave him supplies and walked through changing these). No signs of infection at present  Future Appointments  Date Time Provider [place]  01/19/2018  9:30 AM [facility]   Lab/Order associations: Left forearm pain - Plan: DG Forearm Left  Fall, initial encounter  Meds ordered this encounter  Medications  . traMADol (ULTRAM) 50 MG tablet    Sig: Take 1 tablet (50 mg total) by mouth every 8 (eight) hours as needed for moderate pain or severe pain (dont drive for 8 hours after atking).    Dispense:  20 tablet    Refill:  0    Return precautions advised.  [name], MD

## 2018-01-13 NOTE — Patient Instructions (Addendum)
Health Maintenance Due  Topic Date Due  . INFLUENZA VACCINE hold off for a month with recent Tdap.  11/18/2017   Will await radiology read on x-ray- I see a fair amount of swelling in the elbow area. Likely have you see Dr. Paulla Fore or orthopedics if we find out there is a fracture. If there is a fracture- also consider bone density testing   Trial tramadol for pain at night.   NO MORE CLIMBING ON LADDERS! No driving until arm feels back to normal strength

## 2018-01-19 ENCOUNTER — Ambulatory Visit (INDEPENDENT_AMBULATORY_CARE_PROVIDER_SITE_OTHER): Payer: Medicare Other | Admitting: General Practice

## 2018-01-19 DIAGNOSIS — I4891 Unspecified atrial fibrillation: Secondary | ICD-10-CM | POA: Diagnosis not present

## 2018-01-19 DIAGNOSIS — Z5181 Encounter for therapeutic drug level monitoring: Secondary | ICD-10-CM

## 2018-01-19 LAB — POCT INR: INR: 2.3 (ref 2.0–3.0)

## 2018-01-19 NOTE — Progress Notes (Signed)
I have reviewed and agree with note, evaluation, plan.   Primrose Oler, MD  

## 2018-01-19 NOTE — Patient Instructions (Addendum)
Pre visit review using our clinic review tool, if applicable. No additional management support is needed unless otherwise documented below in the visit note.   Continue to take 1 tablet daily except 2 tablets each Monday/Friday.  Repeat INR in 6 weeks.  

## 2018-02-24 ENCOUNTER — Other Ambulatory Visit: Payer: Self-pay | Admitting: Family Medicine

## 2018-02-24 NOTE — Telephone Encounter (Signed)
Please call him and encourage him to try Tylenol for now.  If that does not control his pain we need to schedule a visit to discuss other options.

## 2018-02-24 NOTE — Telephone Encounter (Signed)
From OV 01/13/2018:  patient sustained serious fall and luckily had great care in the ER afterwards. Main question from family today was about pain in forearm and if there could be a fracture- we updated films today and no fracture found. He is having trouble using the arm due to pain and advise no driving until this resolved. Pain is not adequately controlled with OTC pain options- I wrote an rx for tramadol at least to help him rest.   Ok to refuse?

## 2018-03-02 ENCOUNTER — Ambulatory Visit: Payer: Medicare Other

## 2018-03-02 NOTE — Telephone Encounter (Signed)
Called and spoke to patient who states that he is only having his normal pains. He has recovered from his fall. He was wanting a refill of Tramadol to help him sleep. I explained Tramadol was given previously to help with pain from his fall. I encouraged him to use Tylenol and he verbalized understanding. I stated he could schedule an appointment to discuss if he was having trouble sleeping but he stated he has addressed it before with Dr. Yong Channel. He will call and schedule an appointment if he feels he needs one.

## 2018-03-02 NOTE — Telephone Encounter (Signed)
Great job [employer]

## 2018-03-03 ENCOUNTER — Ambulatory Visit (INDEPENDENT_AMBULATORY_CARE_PROVIDER_SITE_OTHER): Payer: Medicare Other

## 2018-03-03 DIAGNOSIS — Z23 Encounter for immunization: Secondary | ICD-10-CM

## 2018-03-09 ENCOUNTER — Ambulatory Visit (INDEPENDENT_AMBULATORY_CARE_PROVIDER_SITE_OTHER): Payer: Medicare Other | Admitting: General Practice

## 2018-03-09 DIAGNOSIS — Z5181 Encounter for therapeutic drug level monitoring: Secondary | ICD-10-CM | POA: Diagnosis not present

## 2018-03-09 DIAGNOSIS — I4891 Unspecified atrial fibrillation: Secondary | ICD-10-CM

## 2018-03-09 LAB — POCT INR: INR: 3.1 — AB (ref 2.0–3.0)

## 2018-03-09 NOTE — Patient Instructions (Addendum)
Pre visit review using our clinic review tool, if applicable. No additional management support is needed unless otherwise documented below in the visit note.  Skip dosage today (11/20) and then continue to take 1 tablet daily except 2 tablets each Monday/Friday.  Repeat INR in 6 weeks.

## 2018-03-09 NOTE — Progress Notes (Signed)
I have reviewed and agree with note, evaluation, plan.    , MD  

## 2018-03-28 ENCOUNTER — Other Ambulatory Visit: Payer: Self-pay

## 2018-03-28 MED ORDER — TRIAMTERENE-HCTZ 37.5-25 MG PO TABS: 0.5000 | ORAL_TABLET | Freq: Every day | ORAL | 3 refills | Status: DC

## 2018-03-29 ENCOUNTER — Encounter: Payer: Self-pay | Admitting: Physician Assistant

## 2018-03-29 ENCOUNTER — Ambulatory Visit: Payer: Medicare Other | Admitting: Physician Assistant

## 2018-03-29 VITALS — BP 144/80 | HR 57 | Temp 97.7°F | Ht 71.0 in | Wt 168.0 lb

## 2018-03-29 DIAGNOSIS — H6123 Impacted cerumen, bilateral: Secondary | ICD-10-CM | POA: Diagnosis not present

## 2018-03-29 NOTE — Progress Notes (Signed)
William Mahoney is a 84 y.o. male here for a new problem.  I acted as a Education administrator for [employer], PA-C Anselmo Pickler, LPN  History of Present Illness:   Chief Complaint  Patient presents with  . Otalgia    Otalgia   There is pain in the left ear. Chronicity: Started 3 days ago. The problem occurs every few hours. The problem has been unchanged. There has been no fever. The pain is at a severity of 2/10. The pain is mild. Pertinent negatives include no abdominal pain, coughing, diarrhea, ear discharge, headaches, sore throat or vomiting. He has tried ear drops (peroxide) for the symptoms. The treatment provided no relief.   He states that he requires ear lavages often and thinks that he had one last month.  Past Medical History:  Diagnosis Date  . BPH (benign prostatic hypertrophy)   . Chronic atrial fibrillation    cardiologist--   dr berry  . COPD (chronic obstructive pulmonary disease) ([place])   . Diverticulosis of colon    MODERATE LEFT SIDE  . Epididymal cyst    w/ epididymalitis  . Full dentures   . 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 Hearing loss   . History of alcohol abuse    quit drinking in the mid 80's  . History of cirrhosis of liver    alcoholic--  hx alcohol abuse -- quit drinking 1980's  . History of gout   . History of melanoma excision    2013--  left ear lobe and back of hand  . History of pancreatitis    2008  . History of rectal cancer oncologist-  dr Benay Spice--  no recurrence   dx Sept 2009--  Stage II (T3N0)  s/p  sigmoid colectomy & low anterior resection 04-18-2008  and chemoradiation 2010  . History of shingles 07/27/2010  . History of squamous cell carcinoma excision    left lower leg  . Hyperlipidemia   . Hypertension   . Loose stools    DUE TO ANTIBIOTICS  . Macular degeneration    not sure which eye  . OA (osteoarthritis)   . RBBB (right bundle branch block)   . Renal lesion    chronic-- left  side  . Sciatica of right side   . Urge urinary incontinence    intermittant     Social History   Socioeconomic History  . Marital status: Married    Spouse name: Not on file  . Number of children: 6  . Years of education: Not on file  . Highest education level: Not on file  Occupational History  . Occupation: Retired    Fish farm manager: RETIRED  Social Needs  . Financial resource strain: Not on file  . Food insecurity:    Worry: Not on file    Inability: Not on file  . Transportation needs:    Medical: Not on file    Non-medical: Not on file  Tobacco Use  . Smoking status: Former Smoker    Packs/day: 1.00    Years: 55.00    Pack years: 55.00    Types: Cigarettes    Last attempt to quit: 03/20/2008    Years since quitting: 10.0  . Smokeless tobacco: Never Used  Substance and Sexual Activity  . Alcohol use: No    Comment: hx alcohol abuse -- quit in 1980's   . Drug use: No  . Sexual activity: Not on file  Lifestyle  .  Physical activity:    Days per week: Not on file    Minutes per session: Not on file  . Stress: Not on file  Relationships  . Social connections:    Talks on phone: Not on file    Gets together: Not on file    Attends religious service: Not on file    Active member of club or organization: Not on file    Attends meetings of clubs or organizations: Not on file    Relationship status: Not on file  . Intimate partner violence:    Fear of current or ex partner: Not on file    Emotionally abused: Not on file    Physically abused: Not on file    Forced sexual activity: Not on file  Other Topics Concern  . Not on file  Social History Narrative   Married. 6 kids. Wife Joycelyn Schmid also a patient of Dr. Ronney Lion.       Retired.     Past Surgical History:  Procedure Laterality Date  . CARDIAC CATHETERIZATION  2001  approx.  in [place]  . CATARACT EXTRACTION W/ INTRAOCULAR LENS  IMPLANT, BILATERAL    . COLONOSCOPY  last one 06-27-2012  . EXPLORATORY  LAPAROTOMY/  LOW ANTERIOR RESECTION/  SIGMOID COLECTOMY  04-18-2008   DR Dalbert Batman  . INCISION AND DRAINAGE ABSCESS N/A 11/02/2014   Procedure: INCISION AND DRAINAGE OF SCROTUM;  Surgeon: Ardis Hughs, MD;  Location: [facility];  Service: Urology;  Laterality: N/A;  . INGUINAL HERNIA REPAIR Right 03/23/2014   Procedure: HERNIA REPAIR INGUINAL ADULT OPEN REPAIR RIGHT INGUINAL HERNIA REPAIR;  Surgeon: Fanny Skates, MD;  Location: [facility];  Service: General;  Laterality: Right;  . INSERTION OF MESH Right 03/23/2014   Procedure: INSERTION OF MESH;  Surgeon: Fanny Skates, MD;  Location: [facility];  Service: General;  Laterality: Right;  . LAPAROSCOPIC CHOLECYSTECTOMY  1990's  . MASS EXCISION N/A 11/02/2014   Procedure: EXCISION OF EPIDIDYMAL CYST;  Surgeon: Ardis Hughs, MD;  Location[facility];  Service: Urology;  Laterality: N/A;  . TONSILLECTOMY  as child  . TRANSTHORACIC ECHOCARDIOGRAM  05-11-2008   pseudonormal LV filling pattern,  ef 60-65%/  mild to moderate MV calcification no stenosis w/ mild to moderate regurg./  mild LAE and RAE/  mild TR    Family History  Problem Relation Age of Onset  . Hypertension Mother   . Lymphoma Father   . Lymphoma Unknown   . Stroke Unknown        1st degree relative  . Colon cancer Neg Hx   . Esophageal cancer Neg Hx   . Rectal cancer Neg Hx   . Stomach cancer Neg Hx     Allergies  Allergen Reactions  . Flexeril [Cyclobenzaprine Hcl] Other (See Comments)       caused altered mental status    Current Medications:   Current Outpatient Medications:  .  allopurinol (ZYLOPRIM) 300 MG tablet, TAKE ONE-HALF TABLET BY MOUTH IN THE EVENING, Disp: 45 tablet, Rfl: 5 .  colchicine 0.6 MG tablet, Take 0.6 mg by mouth 2 (two) times daily as needed (gout)., Disp: , Rfl:  .  fish oil-omega-3 fatty acids 1000 MG capsule, Take 1 g by mouth daily., Disp: , Rfl:  .  fluticasone (FLONASE) 50 MCG/ACT nasal spray, Place 2 sprays  into both nostrils daily., Disp: 16 g, Rfl: 6 .  furosemide (LASIX) 20 MG tablet, Take 20 mg by mouth daily., Disp: ,  Rfl:  .  metoprolol tartrate (LOPRESSOR) 25 MG tablet, TAKE 1/2 TABLET BY MOUTH 2 TIMES DAILY, Disp: 90 tablet, Rfl: 3 .  Multiple Vitamin (MULTIVITAMIN) tablet, Take 1 tablet by mouth daily., Disp: , Rfl:  .  traMADol (ULTRAM) 50 MG tablet, Take 1 tablet (50 mg total) by mouth every 8 (eight) hours as needed for moderate pain or severe pain (dont drive for 8 hours after atking)., Disp: 20 tablet, Rfl: 0 .  triamterene-hydrochlorothiazide (MAXZIDE-25) 37.5-25 MG tablet, Take 0.5 tablets by mouth daily., Disp: 45 tablet, Rfl: 3 .  warfarin (COUMADIN) 2.5 MG tablet, TAKE AS DIRECTED BY ANTICOAGULATION CLINIC, Disp: 40 tablet, Rfl: 3 .  zolpidem (AMBIEN) 5 MG tablet, TAKE ONE-HALF TABLET BY MOUTH AT BEDTIME AS NEEDED FOR SLEEP, Disp: 15 tablet, Rfl: 3 .  amLODipine (NORVASC) 2.5 MG tablet, Take 1 tablet (2.5 mg total) by mouth daily., Disp: 90 tablet, Rfl: 3   Review of Systems:   Review of Systems  HENT: Positive for ear pain. Negative for ear discharge and sore throat.   Respiratory: Negative for cough.   Gastrointestinal: Negative for abdominal pain, diarrhea and vomiting.  Neurological: Negative for headaches.    Vitals:   Vitals:   03/29/18 1315  BP: (!) 144/80  Pulse: (!) 57  Temp: 97.7 F (36.5 C)  TempSrc: Oral  SpO2: 99%  Weight: 168 lb (76.2 kg)  Height: 5\' 11"  (1.803 m)     Body mass index is 23.43 kg/m.  Physical Exam:   Physical Exam  Constitutional: He appears well-developed. He is cooperative.  Non-toxic appearance. He does not have a sickly appearance. He does not appear ill. No distress.  HENT:  Head: Normocephalic and atraumatic.  Right Ear: External ear and ear canal normal.  Left Ear: External ear and ear canal normal.  Nose: Nose normal. Right sinus exhibits no maxillary sinus tenderness and no frontal sinus tenderness. Left sinus exhibits  no maxillary sinus tenderness and no frontal sinus tenderness.  Mouth/Throat: Uvula is midline. No posterior oropharyngeal edema or posterior oropharyngeal erythema.  Bilateral cerumen impaction  Eyes: Conjunctivae and lids are normal.  Neck: Trachea normal.  Cardiovascular: Normal rate, regular rhythm, S1 normal, S2 normal and normal heart sounds.  Pulmonary/Chest: Effort normal and breath sounds normal. He has no decreased breath sounds. He has no wheezes. He has no rhonchi. He has no rales.  Lymphadenopathy:    He has no cervical adenopathy.  Neurological: He is alert.  Skin: Skin is warm, dry and intact.  Psychiatric: He has a normal mood and affect. His speech is normal and behavior is normal.  Nursing note and vitals reviewed.   Procedure: Cerumen Disimpaction Warm water was applied and gentle ear lavage performed on the bilateral. There were no complications and following the disimpaction the tympanic membrane was visible on the bilateral. Tympanic membranes are intact following the procedure.  Auditory canals are normal.  The patient reported relief of symptoms after removal of cerumen.    Assessment and Plan:   Kayan was seen today for otalgia.  Diagnoses and all orders for this visit:  Bilateral impacted cerumen -     Ear wax removal   Tolerated procedure well. No indication for further intervention based on my exam.  . Reviewed expectations re: course of current medical issues. . Discussed self-management of symptoms. . Outlined signs and symptoms indicating need for more acute intervention. . Patient verbalized understanding and all questions were answered. . See orders for this visit  as documented in the electronic medical record. . Patient received an After-Visit Summary.  CMA or LPN served as scribe during this visit. History, Physical, and Plan performed by medical provider. The above documentation has been reviewed and is accurate and complete.  Inda Coke, PA-C

## 2018-04-07 ENCOUNTER — Encounter: Payer: Self-pay | Admitting: Family Medicine

## 2018-04-07 ENCOUNTER — Ambulatory Visit: Payer: Medicare Other | Admitting: Family Medicine

## 2018-04-07 VITALS — BP 138/70 | HR 53 | Temp 97.3°F | Ht 71.0 in | Wt 166.2 lb

## 2018-04-07 DIAGNOSIS — J301 Allergic rhinitis due to pollen: Secondary | ICD-10-CM

## 2018-04-07 DIAGNOSIS — H6592 Unspecified nonsuppurative otitis media, left ear: Secondary | ICD-10-CM | POA: Diagnosis not present

## 2018-04-07 NOTE — Progress Notes (Signed)
Subjective:  William Mahoney is a 84 y.o. year old very pleasant male patient who presents for/with See problem oriented charting ROS-no fever, chills, rash over ear.  Feels like he has fullness in his left ear.  Past Medical History-  Patient Active Problem List   Diagnosis Date Noted  . Edema 11/10/2016    Priority: High  . Atrial fibrillation ([place]) 12/07/2008    Priority: High  . Insomnia 10/19/2014    Priority: Medium  . Gout 01/09/2014    Priority: Medium  . Malignant neoplasm of rectosigmoid junction ([place]) 01/04/2008    Priority: Medium  . Former smoker 06/23/2007    Priority: Medium  . HYPERLIPIDEMIA 02/09/2007    Priority: Medium  . Essential hypertension 12/07/2006    Priority: Medium  . COPD 12/07/2006    Priority: Medium  . BPH (benign prostatic hyperplasia) 12/07/2006    Priority: Medium  . Allergic rhinitis 08/15/2013    Priority: Low  . 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 Inguinal hernia 03/21/2010    Priority: Low  . BASAL CELL CARCINOMA SKIN LOWER LIMB INCL HIP 03/21/2010    Priority: Low  . Actinic keratosis 12/07/2008    Priority: Low  . OSTEOARTHRITIS 02/09/2007    Priority: Low  . PEPTIC ULCER DISEASE 12/07/2006    Priority: Low  . URINARY INCONTINENCE 12/07/2006    Priority: Low  . PANCREATITIS, HX OF 12/07/2006    Priority: Low  . Thrombocytopenia ([place]) 11/10/2016  . Encounter for therapeutic drug monitoring 08/27/2014    Medications- reviewed and updated Current Outpatient Medications  Medication Sig Dispense Refill  . allopurinol (ZYLOPRIM) 300 MG tablet TAKE ONE-HALF TABLET BY MOUTH IN THE EVENING 45 tablet 5  . colchicine 0.6 MG tablet Take 0.6 mg by mouth 2 (two) times daily as needed (gout).    . fish oil-omega-3 fatty acids 1000 MG capsule Take 1 g by mouth daily.    . fluticasone (FLONASE) 50 MCG/ACT nasal spray Place 2 sprays into both nostrils daily. 16 g 6  . furosemide (LASIX) 20 MG tablet Take 20 mg by  mouth daily.    . metoprolol tartrate (LOPRESSOR) 25 MG tablet TAKE 1/2 TABLET BY MOUTH 2 TIMES DAILY 90 tablet 3  . Multiple Vitamin (MULTIVITAMIN) tablet Take 1 tablet by mouth daily.    . traMADol (ULTRAM) 50 MG tablet Take 1 tablet (50 mg total) by mouth every 8 (eight) hours as needed for moderate pain or severe pain (dont drive for 8 hours after atking). 20 tablet 0  . triamterene-hydrochlorothiazide (MAXZIDE-25) 37.5-25 MG tablet Take 0.5 tablets by mouth daily. 45 tablet 3  . warfarin (COUMADIN) 2.5 MG tablet TAKE AS DIRECTED BY ANTICOAGULATION CLINIC 40 tablet 3  . zolpidem (AMBIEN) 5 MG tablet TAKE ONE-HALF TABLET BY MOUTH AT BEDTIME AS NEEDED FOR SLEEP 15 tablet 3  . amLODipine (NORVASC) 2.5 MG tablet Take 1 tablet (2.5 mg total) by mouth daily. 90 tablet 3   No current facility-administered medications for this visit.     Objective: BP 138/70 (BP Location: Left Arm, Patient Position: Sitting, Cuff Size: Large)   Pulse (!) 53   Temp (!) 97.3 F (36.3 C) (Oral)   Ht 5\' 11"  (1.803 m)   Wt 166 lb 3.2 oz (75.4 kg)   SpO2 96%   BMI 23.18 kg/m  Gen: NAD, resting comfortably Clear rhinorrhea, watery eyes.  Nasal turbinates edematous.  Normal tympanic membrane on the right.  Left tympanic membrane  slightly cloudy with air-fluid level- likely effusion CV: RRR no murmurs rubs or gallops Lungs: CTAB no crackles, wheeze, rhonchi Ext: trace edema Left worse than right Skin: warm, dry Neuro: hearing at baseline, speech normal, moves all extremities  Assessment/Plan:   Otitis media with effusion, left  S: Patient also experiencing left ear ache for 3 weeks- had ears irrigated about 9 days ago but has had persistent symptoms. Feels like ear is congested but his hearing has not been affected. Some aching behind the ear. Tried OTC ear drop. States sometimes if chews feels like it opens up.    Patient with left shoulder pain down into left outer ches tpreviously- saw Aldona Bar but that has  since resolved . No shortness of breath with that. Did not hurt with exertion.  Once again-the symptoms have completely resolved A/P: Appears to have left otitis media with effusion-patient with pretty obvious allergies on exam today with rhinorrhea and watery itchy eyes- we will treat this more aggressively by starting Claritin nightly and restarting his Flonase which he states he stopped over a month ago.  If symptoms do not resolve within 3 weeks we will refer to ear nose and throat for further evaluation  Future Appointments  Date Time Provider [place]  04/27/2018 10:30 AM [facility]   Return precautions advised.  [name], MD

## 2018-04-07 NOTE — Patient Instructions (Signed)
The below information is for pediatric patients but the basic principles are the same-you have some fluid behind your eardrum which is making you feel congested.  The treatment we have planned for this is to use Claritin before bed each night and also use your Flonase on a daily basis for 3 weeks  If you are not improving or symptoms worsen-I want to refer you to the ear nose and throat doctor for further evaluation.  Hope you have a Merry Christmas!  Thanks, [name]    Otitis Media With Effusion, Pediatric  Otitis media with effusion (OME) occurs when there is inflammation of the middle ear and fluid in the middle ear space. There are no signs and symptoms of infection. The middle ear space contains air and the bones for hearing. Air in the middle ear space helps to transmit sound to the brain. OME is a common condition in children, and it often occurs after an ear infection. This condition may be present for several weeks or longer after an ear infection. Most cases of this condition get better on their own. What are the causes? OME is caused by a blockage of the eustachian tube in one or both ears. These tubes drain fluid in the ears to the back of the nose (nasopharynx). If the tissue in the tube swells up (edema), the tube closes. This prevents fluid from draining. Blockage can be caused by:  Ear infections.  Colds and other upper respiratory infections.  Allergies.  Irritants, such as tobacco smoke.  Enlarged adenoids. The adenoids are areas of soft tissue located high in the back of the throat, behind the nose and the roof of the mouth. They are part of the body's natural defense (immune) system.  A mass in the nasopharynx.  Damage to the ear caused by pressure changes (barotrauma). What increases the risk? Your child is more likely to develop this condition if:  He or she has repeated ear and sinus infections.  He or she has allergies.  He or she is exposed to  tobacco smoke.  He or she attends daycare.  He or she is not breastfed. What are the signs or symptoms? Symptoms of this condition may not be obvious. Sometimes this condition does not have any symptoms, or symptoms may overlap with those of a cold or upper respiratory tract illness. Symptoms of this condition include:  Temporary hearing loss.  A feeling of fullness in the ear without pain.  Irritability or agitation.  Balance (vestibular) problems. As a result of hearing loss, your child may:  Listen to the TV at a loud volume.  Not respond to questions.  Ask "What?" often when spoken to.  Mistake or confuse one sound or word for another.  Perform poorly at school.  Have a poor attention span.  Become agitated or irritated easily. How is this diagnosed? This condition is diagnosed with an ear exam. Your child's health care provider will look inside your child's ear with an instrument (otoscope) to check for redness, swelling, and fluid. Other tests may be done, including:  A test to check the movement of the eardrum (pneumatic otoscopy). This is done by squeezing a small amount of air into the ear.  A test that changes air pressure in the middle ear to check how well the eardrum moves and to see if the eustachian tube is working (tympanogram).  Hearing test (audiogram). This test involves playing tones at different pitches to see if your child can hear  each tone. How is this treated? Treatment for this condition depends on the cause. In many cases, the fluid goes away on its own. In some cases, your child may need a procedure to create a hole in the eardrum to allow fluid to drain (myringotomy) and to insert small drainage tubes (tympanostomy tubes) into the eardrums. These tubes help to drain fluid and prevent infection. This procedure may be recommended if:  OME does not get better over several months.  Your child has many ear infections within several months.  Your  child has noticeable hearing loss.  Your child has problems with speech and language development. Surgery may also be done to remove the adenoids (adenoidectomy). Follow these instructions at home:  Give over-the-counter and prescription medicines only as told by your child's health care provider.  Keep children away from any tobacco smoke.  Keep all follow-up visits as told by your child's health care provider. This is important. How is this prevented?  Keep your child's vaccinations up to date. Make sure your child gets all recommended vaccinations, including a pneumonia and flu vaccine.  Encourage hand washing. Your child should wash his or her hands often with soap and water. If there is no soap and water, he or she should use hand sanitizer.  Avoid exposing your child to tobacco smoke.  Breastfeed your baby, if possible. Babies who are breastfed as long as possible are less likely to develop this condition. Contact a health care provider if:  Your child's hearing does not get better after 3 months.  Your child's hearing is worse.  Your child has ear pain.  Your child has a fever.  Your child has drainage from the ear.  Your child is dizzy.  Your child has a lump on his or her neck. Get help right away if:  Your child has bleeding from the nose.  Your child cannot move part of her or his face.  Your child has trouble breathing.  Your child cannot smell.  Your child develops severe congestion.  Your child develops weakness.  Your child who is younger than 3 months has a temperature of 100F (38C) or higher. Summary  Otitis media with effusion (OME) occurs when there is inflammation of the middle ear and fluid in the middle ear space.  This condition is caused by blockage of one or both eustachian tubes, which drain fluid in the ears to the back of the nose.  Symptoms of this condition can include temporary hearing loss, a feeling of fullness in the ear,  irritability or agitation, and balance (vestibular) problems. Sometimes, there are no symptoms.  This condition is diagnosed with an ear exam and tests, such as pneumatic otoscopy, tympanogram, and audiogram.  Treatment for this condition depends on the cause. In many cases, the fluid goes away on its own. This information is not intended to replace advice given to you by your health care provider. Make sure you discuss any questions you have with your health care provider. Document Released: 06/27/2003 Document Revised: 02/27/2016 Document Reviewed: 02/27/2016 Elsevier Interactive Patient Education  2019 [employer].

## 2018-04-26 ENCOUNTER — Ambulatory Visit: Payer: Medicare Other | Admitting: Family Medicine

## 2018-04-26 ENCOUNTER — Encounter: Payer: Self-pay | Admitting: Family Medicine

## 2018-04-26 VITALS — BP 136/84 | HR 64 | Temp 97.3°F | Ht 71.0 in | Wt 165.0 lb

## 2018-04-26 DIAGNOSIS — R32 Unspecified urinary incontinence: Secondary | ICD-10-CM | POA: Diagnosis not present

## 2018-04-26 DIAGNOSIS — I10 Essential (primary) hypertension: Secondary | ICD-10-CM | POA: Diagnosis not present

## 2018-04-26 DIAGNOSIS — M26609 Unspecified temporomandibular joint disorder, unspecified side: Secondary | ICD-10-CM | POA: Diagnosis not present

## 2018-04-26 DIAGNOSIS — Z5181 Encounter for therapeutic drug level monitoring: Secondary | ICD-10-CM

## 2018-04-26 DIAGNOSIS — I4891 Unspecified atrial fibrillation: Secondary | ICD-10-CM | POA: Diagnosis not present

## 2018-04-26 LAB — POC URINALSYSI DIPSTICK (AUTOMATED)
BILIRUBIN UA: NEGATIVE
Glucose, UA: NEGATIVE
Ketones, UA: NEGATIVE
Leukocytes, UA: NEGATIVE
NITRITE UA: NEGATIVE
PH UA: 6 (ref 5.0–8.0)
Protein, UA: NEGATIVE
SPEC GRAV UA: 1.015 (ref 1.010–1.025)
Urobilinogen, UA: 0.2 E.U./dL

## 2018-04-26 LAB — POCT INR: INR: 2.7 (ref 2.0–3.0)

## 2018-04-26 MED ORDER — PREDNISONE 20 MG PO TABS: ORAL_TABLET | ORAL | 0 refills | Status: DC

## 2018-04-26 NOTE — Assessment & Plan Note (Signed)
S: controlled on repeat on triamterine-hctz 37.5-25mg , amlodipine 2.5mg , Lasix 20 mg and metoprolol 12.5mg  BID BP Readings from Last 3 Encounters:  04/26/18 136/84  04/07/18 138/70  03/29/18 (!) 144/80  A/P: We discussed blood pressure goal of <140/90. Continue current meds

## 2018-04-26 NOTE — Patient Instructions (Addendum)
I think this pain is related to your TMJ joint particularly with how your pain worsens with opening your mouth wide. Your ear looks normal (fluid appears to now be gone) and your pain is now in front of your ear. Lets try prednisone for 7 days- if not doing better give [place] a call and we will refer you to the ear nose and throat doctors for their opinion.   Also drop off a urine to make sure no infection in the urine. If you have new or worsening symptoms please see [place] back in urine regard.   I am going to encourage you to try at least 1 more glass of water today to see if that helps with cramping though more water can make you pee more which may be inconvenient    Temporomandibular Joint Syndrome  Temporomandibular joint syndrome (TMJ syndrome) is a condition that causes pain in the temporomandibular joints. These joints are located near your ears and allow your jaw to open and close. For people with TMJ syndrome, chewing, biting, or other movements of the jaw can be difficult or painful. TMJ syndrome is often mild and goes away within a few weeks. However, sometimes the condition becomes a long-term (chronic) problem. What are the causes? This condition may be caused by:  Grinding your teeth or clenching your jaw. Some people do this when they are under stress.  Arthritis.  Injury to the jaw.  Head or neck injury.  Teeth or dentures that are not aligned well. In some cases, the cause of TMJ syndrome may not be known. What are the signs or symptoms? The most common symptom of this condition is an aching pain on the side of the head in the area of the TMJ. Other symptoms may include:  Pain when moving your jaw, such as when chewing or biting.  Being unable to open your jaw all the way.  Making a clicking sound when you open your mouth.  Headache.  Earache.  Neck or shoulder pain. How is this diagnosed? This condition may be diagnosed based on:  Your symptoms and medical  history.  A physical exam. Your health care provider may check the range of motion of your jaw.  Imaging tests, such as X-rays or an MRI. You may also need to see your dentist, who will determine if your teeth and jaw are lined up correctly. How is this treated? TMJ syndrome often goes away on its own. If treatment is needed, the options may include:  Eating soft foods and applying ice or heat.  Medicines to relieve pain or inflammation.  Medicines or massage to relax the muscles.  A splint, bite plate, or mouthpiece to prevent teeth grinding or jaw clenching.  Relaxation techniques or counseling to help reduce stress.  A therapy for pain in which an electrical current is applied to the nerves through the skin (transcutaneous electrical nerve stimulation).  Acupuncture. This is sometimes helpful to relieve pain.  Jaw surgery. This is rarely needed. Follow these instructions at home:  Eating and drinking  Eat a soft diet if you are having trouble chewing.  Avoid foods that require a lot of chewing. Do not chew gum. General instructions  Take over-the-counter and prescription medicines only as told by your health care provider.  If directed, put ice on the painful area. ? Put ice in a plastic bag. ? Place a towel between your skin and the bag. ? Leave the ice on for 20 minutes, 2-3 times  a day.  Apply a warm, wet cloth (warm compress) to the painful area as directed.  Massage your jaw area and do any jaw stretching exercises as told by your health care provider.  If you were given a splint, bite plate, or mouthpiece, wear it as told by your health care provider.  Keep all follow-up visits as told by your health care provider. This is important. Contact a health care provider if:  You are having trouble eating.  You have new or worsening symptoms. Get help right away if:  Your jaw locks open or closed. Summary  Temporomandibular joint syndrome (TMJ syndrome) is a  condition that causes pain in the temporomandibular joints. These joints are located near your ears and allow your jaw to open and close.  TMJ syndrome is often mild and goes away within a few weeks. However, sometimes the condition becomes a long-term (chronic) problem.  Symptoms include an aching pain on the side of the head in the area of the TMJ, pain when chewing or biting, and being unable to open your jaw all the way. You may also make a clicking sound when you open your mouth.  TMJ syndrome often goes away on its own. If treatment is needed, it may include medicines to relieve pain, reduce inflammation, or relax the muscles. A splint, bite plate, or mouthpiece may also be used to prevent teeth grinding or jaw clenching. This information is not intended to replace advice given to you by your health care provider. Make sure you discuss any questions you have with your health care provider. Document Released: 12/30/2000 Document Revised: 05/18/2017 Document Reviewed: 05/18/2017 Elsevier Interactive Patient Education  2019 [employer].

## 2018-04-26 NOTE — Progress Notes (Signed)
Subjective:  William Mahoney is a 85 y.o. year old very pleasant male patient who presents for/with See problem oriented charting ROS-patient with a clicking sound with opening and shutting his jaw- pain in front of left ear.  Does have some slight bruising in this area.  No chest pain or shortness of breath reported  Past Medical History-  Patient Active Problem List   Diagnosis Date Noted  . Edema 11/10/2016    Priority: High  . Atrial fibrillation ([place]) 12/07/2008    Priority: High  . Insomnia 10/19/2014    Priority: Medium  . Gout 01/09/2014    Priority: Medium  . 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 Former smoker 06/23/2007    Priority: Medium  . HYPERLIPIDEMIA 02/09/2007    Priority: Medium  . Essential hypertension 12/07/2006    Priority: Medium  . COPD 12/07/2006    Priority: Medium  . BPH (benign prostatic hyperplasia) 12/07/2006    Priority: Medium  . Allergic rhinitis 08/15/2013    Priority: Low  . 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 Inguinal hernia 03/21/2010    Priority: Low  . BASAL CELL CARCINOMA SKIN LOWER LIMB INCL HIP 03/21/2010    Priority: Low  . Actinic keratosis 12/07/2008    Priority: Low  . OSTEOARTHRITIS 02/09/2007    Priority: Low  . PEPTIC ULCER DISEASE 12/07/2006    Priority: Low  . URINARY INCONTINENCE 12/07/2006    Priority: Low  . PANCREATITIS, HX OF 12/07/2006    Priority: Low  . Thrombocytopenia ([place]) 11/10/2016  . Encounter for therapeutic drug monitoring 08/27/2014    Medications- reviewed and updated Current Outpatient Medications  Medication Sig Dispense Refill  . allopurinol (ZYLOPRIM) 300 MG tablet TAKE ONE-HALF TABLET BY MOUTH IN THE EVENING 45 tablet 5  . colchicine 0.6 MG tablet Take 0.6 mg by mouth 2 (two) times daily as needed (gout).    . fish oil-omega-3 fatty acids 1000 MG capsule Take 1 g by mouth daily.    . fluticasone (FLONASE) 50 MCG/ACT nasal  spray Place 2 sprays into both nostrils daily. 16 g 6  . furosemide (LASIX) 20 MG tablet Take 20 mg by mouth daily.    . metoprolol tartrate (LOPRESSOR) 25 MG tablet TAKE 1/2 TABLET BY MOUTH 2 TIMES DAILY 90 tablet 3  . Multiple Vitamin (MULTIVITAMIN) tablet Take 1 tablet by mouth daily.    . traMADol (ULTRAM) 50 MG tablet Take 1 tablet (50 mg total) by mouth every 8 (eight) hours as needed for moderate pain or severe pain (dont drive for 8 hours after atking). 20 tablet 0  . triamterene-hydrochlorothiazide (MAXZIDE-25) 37.5-25 MG tablet Take 0.5 tablets by mouth daily. 45 tablet 3  . warfarin (COUMADIN) 2.5 MG tablet TAKE AS DIRECTED BY ANTICOAGULATION CLINIC 40 tablet 3  . zolpidem (AMBIEN) 5 MG tablet TAKE ONE-HALF TABLET BY MOUTH AT BEDTIME AS NEEDED FOR SLEEP 15 tablet 3  . amLODipine (NORVASC) 2.5 MG tablet Take 1 tablet (2.5 mg total) by mouth daily. 90 tablet 3   No current facility-administered medications for this visit.     Objective: BP 136/84   Pulse 64   Temp (!) 97.3 F (36.3 C) (Oral)   Ht 5\' 11"  (1.803 m)   Wt 165 lb (74.8 kg)   SpO2 93%   BMI 23.01 kg/m  Gen: NAD, resting comfortably Patient with cracking/sensation in bilateral jaw at TMJ-worse on the left.  He  is tender in this area.  He does have some bruising over the left posterior cheek. CV: Regular rate-no murmurs rubs or gallops Lungs: CTAB no crackles, wheeze, rhonchi Ext: no edema Skin: warm, dry Neuro: Speech normal, moves all extremities    Results for orders placed or performed in visit on 04/26/18 (from the past 24 hour(s))  POCT Urinalysis Dipstick (Automated)     Status: None   Collection Time: 04/26/18  1:35 PM  Result Value Ref Range   Color, UA yellow    Clarity, UA clear    Glucose, UA Negative Negative   Bilirubin, UA Negative    Ketones, UA Negative    Spec Grav, UA 1.015 1.010 - 1.025   Blood, UA trace    pH, UA 6.0 5.0 - 8.0   Protein, UA Negative Negative   Urobilinogen, UA 0.2  0.2 or 1.0 E.U./dL   Nitrite, UA Negative    Leukocytes, UA Negative Negative  POCT INR     Status: None   Collection Time: 04/26/18  1:37 PM  Result Value Ref Range   INR 2.7 2.0 - 3.0     Assessment/Plan:   Left jaw pain-pain now in front of left ear  s:  patient seen on 04/07/18  And diagnosed with left OME- he was having watery itchy eyes and runny nose- he has consistently taken Claritin nightly and restarted Flonase which slightly improved symptoms but still having some bad days.  Within 3-4 days he noted resolution of symptoms and lasted a few days and then sometime after Christmas pain started again - now located more in jaw- hurts worse with chewing.  Also notes a clicking and popping sound when he opens his mouth all the way yesterday almost resolved completely - now bothering him again. Pain today 4/10.  When he feels like cracking in the jaw- gets dull ache with this.  A/P: I suspect this may be TMJ disorder-NSAIDs not ideal given his Coumadin use.  Therefore we will trial low-dose prednisone course-patient will follow-up if not improving and we will refer to ENT for their opinion  Incontinence/urgency- couldbe BPH related S: nocturia 2-3x a night, incontinence has worsened and he does not feel like he can control it- when he has to go he has to go. Didn't do well on flomax in past has had no significant improvement in symptoms. Also gets some fecal incontinence- much less frequent of an issue- has more time than the urine issues to get to the bathroom.  Does not report lower extremity weakness A/P: Suspect patient may be having incontinent issues related to incomplete voiding related to BPH.  We get a urine today and no obvious infection-there was possible hematuria so we will have him back for urine microscopic.  Muscle cramps S: tried mustard worked a few times for cramps but not as helpful lately. Cramping in hands and feet at night. Doesn't drink much water at all.  A/P: We  discussed patient's issue would be electrolyte related or mild dehydration related-encouraged increased fluid intake-this may be difficult given his incontinence issues but I do think it would help the cramping  Atrial fibrillation Patient is anticoagulated with Coumadin-he requests an early INR check today which was completed and within normal range.  He is also on metoprolol for rate control.  He is doing well continue current medications  Essential hypertension S: controlled on repeat on triamterine-hctz 37.5-25mg , amlodipine 2.5mg , Lasix 20 mg and metoprolol 12.5mg  BID BP Readings from Last 3  Encounters:  04/26/18 136/84  04/07/18 138/70  03/29/18 (!) 144/80  A/P: We discussed blood pressure goal of <140/90. Continue current meds    Lab/Order associations: Urinary incontinence, unspecified type - Plan: POCT Urinalysis Dipstick (Automated)  Thrombocytopenia ([facility]) - Plan: POCT INR  Atrial fibrillation, unspecified type ([place])  Essential hypertension  Meds ordered this encounter  Medications  . predniSONE (DELTASONE) 20 MG tablet    Sig: Take 1 tablet by mouth daily for 5 days, then 1/2 tablet daily for 2 days    Dispense:  6 tablet    Refill:  0    Return precautions advised.  [name], MD

## 2018-04-26 NOTE — Assessment & Plan Note (Addendum)
Patient is anticoagulated with Coumadin-he requests an early INR check today which was completed and within normal range.  He is also on metoprolol for rate control.  He is doing well continue current medications

## 2018-04-27 ENCOUNTER — Ambulatory Visit: Payer: Medicare Other

## 2018-04-27 ENCOUNTER — Other Ambulatory Visit: Payer: Self-pay

## 2018-04-27 ENCOUNTER — Ambulatory Visit (INDEPENDENT_AMBULATORY_CARE_PROVIDER_SITE_OTHER): Payer: Medicare Other | Admitting: General Practice

## 2018-04-27 DIAGNOSIS — Z5181 Encounter for therapeutic drug level monitoring: Secondary | ICD-10-CM | POA: Diagnosis not present

## 2018-04-27 DIAGNOSIS — I4891 Unspecified atrial fibrillation: Secondary | ICD-10-CM

## 2018-04-27 DIAGNOSIS — R319 Hematuria, unspecified: Secondary | ICD-10-CM

## 2018-04-28 ENCOUNTER — Other Ambulatory Visit (INDEPENDENT_AMBULATORY_CARE_PROVIDER_SITE_OTHER): Payer: Medicare Other

## 2018-04-28 DIAGNOSIS — R319 Hematuria, unspecified: Secondary | ICD-10-CM | POA: Diagnosis not present

## 2018-04-29 LAB — URINALYSIS, MICROSCOPIC ONLY
RBC / HPF: NONE SEEN (ref 0–?)
WBC UA: NONE SEEN (ref 0–?)

## 2018-06-08 ENCOUNTER — Ambulatory Visit (INDEPENDENT_AMBULATORY_CARE_PROVIDER_SITE_OTHER): Payer: Medicare Other | Admitting: General Practice

## 2018-06-08 DIAGNOSIS — Z5181 Encounter for therapeutic drug level monitoring: Secondary | ICD-10-CM | POA: Diagnosis not present

## 2018-06-08 DIAGNOSIS — I4891 Unspecified atrial fibrillation: Secondary | ICD-10-CM

## 2018-06-08 LAB — POCT INR: INR: 2.7 (ref 2.0–3.0)

## 2018-06-08 NOTE — Patient Instructions (Addendum)
Pre visit review using our clinic review tool, if applicable. No additional management support is needed unless otherwise documented below in the visit note.   Continue to take 1 tablet daily except 2 tablets each Monday/Friday.  Repeat INR in 6 weeks.

## 2018-06-08 NOTE — Progress Notes (Signed)
I have reviewed this visit and I agree on the patient's plan of dosage and recommendations. Cataleah Stites, DO   

## 2018-06-10 ENCOUNTER — Other Ambulatory Visit: Payer: Self-pay | Admitting: Family Medicine

## 2018-06-10 NOTE — Telephone Encounter (Signed)
Refilled

## 2018-06-10 NOTE — Telephone Encounter (Signed)
Okay for refill? Please advise 

## 2018-06-13 ENCOUNTER — Other Ambulatory Visit: Payer: Self-pay | Admitting: Family Medicine

## 2018-06-14 ENCOUNTER — Ambulatory Visit: Payer: Medicare Other | Admitting: Family Medicine

## 2018-06-15 ENCOUNTER — Encounter: Payer: Self-pay | Admitting: Family Medicine

## 2018-06-15 ENCOUNTER — Ambulatory Visit: Payer: Medicare Other | Admitting: Family Medicine

## 2018-06-15 VITALS — BP 118/78 | HR 52 | Temp 97.6°F | Ht 71.0 in | Wt 169.2 lb

## 2018-06-15 DIAGNOSIS — M26609 Unspecified temporomandibular joint disorder, unspecified side: Secondary | ICD-10-CM

## 2018-06-15 MED ORDER — BACLOFEN 10 MG PO TABS: 10.0000 mg | ORAL_TABLET | Freq: Three times a day (TID) | ORAL | 0 refills | Status: DC

## 2018-06-15 NOTE — Patient Instructions (Signed)
Starting you on muscle relaxer. Can take at night. If needed can take during day as well. Should be a short term thing. Also recommend you see your dentist for exercises/etc.   Temporomandibular Joint Syndrome  Temporomandibular joint syndrome (TMJ syndrome) is a condition that causes pain in the temporomandibular joints. These joints are located near your ears and allow your jaw to open and close. For people with TMJ syndrome, chewing, biting, or other movements of the jaw can be difficult or painful. TMJ syndrome is often mild and goes away within a few weeks. However, sometimes the condition becomes a long-term (chronic) problem. What are the causes? This condition may be caused by:  Grinding your teeth or clenching your jaw. Some people do this when they are under stress.  Arthritis.  Injury to the jaw.  Head or neck injury.  Teeth or dentures that are not aligned well. In some cases, the cause of TMJ syndrome may not be known. What are the signs or symptoms? The most common symptom of this condition is an aching pain on the side of the head in the area of the TMJ. Other symptoms may include:  Pain when moving your jaw, such as when chewing or biting.  Being unable to open your jaw all the way.  Making a clicking sound when you open your mouth.  Headache.  Earache.  Neck or shoulder pain. How is this diagnosed? This condition may be diagnosed based on:  Your symptoms and medical history.  A physical exam. Your health care provider may check the range of motion of your jaw.  Imaging tests, such as X-rays or an MRI. You may also need to see your dentist, who will determine if your teeth and jaw are lined up correctly. How is this treated? TMJ syndrome often goes away on its own. If treatment is needed, the options may include:  Eating soft foods and applying ice or heat.  Medicines to relieve pain or inflammation.  Medicines or massage to relax the muscles.  A  splint, bite plate, or mouthpiece to prevent teeth grinding or jaw clenching.  Relaxation techniques or counseling to help reduce stress.  A therapy for pain in which an electrical current is applied to the nerves through the skin (transcutaneous electrical nerve stimulation).  Acupuncture. This is sometimes helpful to relieve pain.  Jaw surgery. This is rarely needed. Follow these instructions at home:  Eating and drinking  Eat a soft diet if you are having trouble chewing.  Avoid foods that require a lot of chewing. Do not chew gum. General instructions  Take over-the-counter and prescription medicines only as told by your health care provider.  If directed, put ice on the painful area. ? Put ice in a plastic bag. ? Place a towel between your skin and the bag. ? Leave the ice on for 20 minutes, 2-3 times a day.  Apply a warm, wet cloth (warm compress) to the painful area as directed.  Massage your jaw area and do any jaw stretching exercises as told by your health care provider.  If you were given a splint, bite plate, or mouthpiece, wear it as told by your health care provider.  Keep all follow-up visits as told by your health care provider. This is important. Contact a health care provider if:  You are having trouble eating.  You have new or worsening symptoms. Get help right away if:  Your jaw locks open or closed. Summary  Temporomandibular joint syndrome (TMJ  syndrome) is a condition that causes pain in the temporomandibular joints. These joints are located near your ears and allow your jaw to open and close.  TMJ syndrome is often mild and goes away within a few weeks. However, sometimes the condition becomes a long-term (chronic) problem.  Symptoms include an aching pain on the side of the head in the area of the TMJ, pain when chewing or biting, and being unable to open your jaw all the way. You may also make a clicking sound when you open your mouth.  TMJ  syndrome often goes away on its own. If treatment is needed, it may include medicines to relieve pain, reduce inflammation, or relax the muscles. A splint, bite plate, or mouthpiece may also be used to prevent teeth grinding or jaw clenching. This information is not intended to replace advice given to you by your health care provider. Make sure you discuss any questions you have with your health care provider. Document Released: 12/30/2000 Document Revised: 05/18/2017 Document Reviewed: 05/18/2017 Elsevier Interactive Patient Education  2019 [employer].

## 2018-06-15 NOTE — Progress Notes (Signed)
Patient: William Mahoney MRN: [id number] DOB: 06/06/1925 PCP: Marin Olp, MD     Subjective:  Chief Complaint  Patient presents with  . left sided ear pain    HPI: The patient is a 85 y.o. male who presents today for left sided ear pain. Was seen in December for same issue and had his ears cleaned out. Then saw his PCP a week later and was started on flonase and claritin. This helped. He was seen again and diagnosed with TMJ and given steroids. He states it got 99% better. He took the steroids on 04/26/18 and had symptoms return 1 week after he completed steroids. Pain is only in left ear. He states pain is not as bad as it was. Pain is constant and is worse with chewing or opening mouth wide. He has no ear drainage, swelling and no hearing loss. +tinnitus. [name] He does not grind teeth. Has dentures and removes at night. No headache. Denies any new dental work.   Review of Systems  Constitutional: Negative for chills and fever.  HENT: Positive for ear pain (left ear ), rhinorrhea and tinnitus. Negative for congestion, ear discharge, facial swelling and hearing loss.   Respiratory: Negative for cough and shortness of breath.   Cardiovascular: Negative for chest pain and palpitations.  Gastrointestinal: Negative for abdominal pain.  Neurological: Negative for dizziness and headaches.    Allergies Patient is allergic to flexeril [cyclobenzaprine hcl].  Past Medical History Patient  has a past medical history of BPH (benign prostatic hypertrophy), Chronic atrial fibrillation, COPD (chronic obstructive pulmonary disease) ([place]), Diverticulosis of colon, Epididymal cyst, Full dentures, Hand foot syndrome, Hearing loss, History of alcohol abuse, History of cirrhosis of liver, History of gout, History of melanoma excision, History of pancreatitis, History of rectal cancer (oncologist-  dr Benay Spice--  no recurrence), History of shingles (07/27/2010), History of squamous cell carcinoma excision,  Hyperlipidemia, Hypertension, Loose stools, Macular degeneration, OA (osteoarthritis), RBBB (right bundle branch block), Renal lesion, Sciatica of right side, and Urge urinary incontinence.  Surgical History Patient  has a past surgical history that includes Inguinal hernia repair (Right, 03/23/2014); Insertion of mesh (Right, 03/23/2014); Cardiac catheterization (2001  approx.  in [place]); Cataract extraction w/ intraocular lens  implant, bilateral; EXPLORATORY LAPAROTOMY/  LOW ANTERIOR RESECTION/  SIGMOID COLECTOMY (04-18-2008   DR Dalbert Batman); Colonoscopy (last one 06-27-2012); transthoracic echocardiogram (05-11-2008); Laparoscopic cholecystectomy (1990's); Tonsillectomy (as child); Mass excision (N/A, 11/02/2014); and Incision and drainage abscess (N/A, 11/02/2014).  Family History Pateint's family history includes Hypertension in his mother; Lymphoma in his father and unknown relative; Stroke in his unknown relative.  Social History Patient  reports that he quit smoking about 10 years ago. His smoking use included cigarettes. He has a 55.00 pack-year smoking history. He has never used smokeless tobacco. He reports that he does not drink alcohol or use drugs.    Objective: Vitals:   06/15/18 1122  BP: 118/78  Pulse: (!) 52  Temp: 97.6 F (36.4 C)  TempSrc: Oral  SpO2: 90%  Weight: 169 lb 3.2 oz (76.7 kg)  Height: 5\' 11"  (1.803 m)    Body mass index is 23.6 kg/m.  Physical Exam HENT:     Right Ear: Tympanic membrane, ear canal and external ear normal.     Left Ear: Tympanic membrane, ear canal and external ear normal.     Nose: Nose normal.     Mouth/Throat:     Comments: TTP over left TMJ. No locking/popping  Neck:  Musculoskeletal: Normal range of motion and neck supple.  Cardiovascular:     Rate and Rhythm: Normal rate and regular rhythm.  Pulmonary:     Effort: Pulmonary effort is normal.     Breath sounds: Normal breath sounds.  Abdominal:     General: Abdomen is flat.  Bowel sounds are normal.     Palpations: Abdomen is soft.        Assessment/plan: 1. TMJ (temporomandibular joint disorder) Pain relieved with steroids. Discussed with him best treatment is actually conservative treatment with exercises. I recommended he see dentist and to make sure no change in his dentures that has exacerbated this. Will do muscle relaxer prn/night. Allergy to flexeril. Will do trial of baclofen and see how he does. GFR >60 on last check. F/u with dentist to make sure no night guard needed and for possible exercises. F/u with Dr. Yong Channel in one month to make sure no renal check needs to be done if routinely taking baclofen.    -discussed medication with daughter as well.    Return in about 1 month (around 07/14/2018) for jaw recheck .    Orma Flaming, MD [place]   06/15/2018

## 2018-06-21 ENCOUNTER — Other Ambulatory Visit: Payer: Self-pay

## 2018-06-21 ENCOUNTER — Ambulatory Visit: Payer: Self-pay

## 2018-06-21 ENCOUNTER — Ambulatory Visit: Payer: Medicare Other | Admitting: Family Medicine

## 2018-06-21 ENCOUNTER — Encounter: Payer: Self-pay | Admitting: Family Medicine

## 2018-06-21 VITALS — BP 116/76 | HR 56 | Temp 97.6°F | Ht 71.0 in | Wt 165.1 lb

## 2018-06-21 DIAGNOSIS — M26622 Arthralgia of left temporomandibular joint: Secondary | ICD-10-CM | POA: Diagnosis not present

## 2018-06-21 DIAGNOSIS — M26629 Arthralgia of temporomandibular joint, unspecified side: Secondary | ICD-10-CM | POA: Diagnosis not present

## 2018-06-21 MED ORDER — PREDNISONE 20 MG PO TABS: ORAL_TABLET | ORAL | 0 refills | Status: DC

## 2018-06-21 NOTE — Telephone Encounter (Signed)
FYI. Noted

## 2018-06-21 NOTE — Patient Instructions (Signed)

## 2018-06-21 NOTE — Telephone Encounter (Signed)
See note, patient requested Dr. Elease Hashimoto.

## 2018-06-21 NOTE — Telephone Encounter (Signed)
Pt c/o left sided jaw pain. Was seen in office January and end of February. Pt has taken steroids and was ordered to take Baclofen. Pt stated that it does help, but his family is concerned that he is acting "loopy." Pt stated the pain is mild, like a clogged ear. Pt with nasal discharge, eyes watering, runny  Nose preexisting to the jaw pain. Pt was advised to go to the dentist and he is getting new dentures. He stated that the dentist did not discuss jaw exercises.  Pt wanting appointment today with Dr Yong Channel who has no openings. Pt asked to see Dr Rogers Blocker and her next appt is March 11. Pt daughter got on the phone and requested to see if Dr Elease Hashimoto has any openings. Advised to pt and daughter that I could see if another provider is available at [facility]. Daughter refused. Appt given for today with Dr Elease Hashimoto. Reason for Disposition . Face pain  is a chronic symptom (recurrent or ongoing AND present > 4 weeks)  Answer Assessment - Initial Assessment Questions 1. ONSET: "When did the pain start?" (e.g., minutes, hours, days)     December 2. ONSET: "Does the pain come and go, or has it been constant since it started?" (e.g., constant, intermittent, fleeting)     Comes and goes doesn't disappear completely 3. SEVERITY: "How bad is the pain?"   (Scale 1-10; mild, moderate or severe)   - MILD (1-3): doesn't interfere with normal activities    - MODERATE (4-7): interferes with normal activities or awakens from sleep    - SEVERE (8-10): excruciating pain, unable to do any normal activities      1-2 mild like a clogged ear - jaws cracking 4. LOCATION: "Where does it hurt?"      Left side of jaw 5. RASH: "Is there any redness, rash, or swelling of the face?"     no 6. FEVER: "Do you have a fever?" If so, ask: "What is it, how was it measured, and when did it start?"      no 7. OTHER SYMPTOMS: "Do you have any other symptoms?" (e.g., fever, toothache, nasal discharge, nasal congestion, clicking sensation in  jaw joint)    Nasal discharge, eyes watering, nose is running preexisting the jaw issue- clicking sensation left jaw joint 8. PREGNANCY: "Is there any chance you are pregnant?" "When was your last menstrual period?"     n/a  Protocols used: FACE PAIN-A-AH

## 2018-06-21 NOTE — Progress Notes (Signed)
Subjective:     Patient ID: William Mahoney, male   DOB: [birth year], 85 y.o.   MRN: [id number]  HPI Patient seen with some persistent approximately 55-month history of dull achy pain left facial region.  He was seen in December with possible otitis media with effusion on the left and took some Claritin and Flonase.  Was seen back January 7 with clicking sensation with opening and closing his mouth on the left side near the TMJ region.[name]  He was treated with very low-dose prednisone which he states helped but then symptoms recurred.    He was seen again on February 26 and prescribed baclofen which he states has not helped and he has felt somewhat "loopy "when taking that.  He has been trying to eat more soft foods.  Has not had any recent reported appetite or weight changes.  No visible facial swelling.  Denies any prior history of TMJ.  He went to his dentist and they are looking at making a revision of his lower partial.  Has not tried any heat or ice  Past Medical History:  Diagnosis Date  . BPH (benign prostatic hypertrophy)   . Chronic atrial fibrillation    cardiologist--   dr berry  . COPD (chronic obstructive pulmonary disease) ([place])   . Diverticulosis of colon    MODERATE LEFT SIDE  . Epididymal cyst    w/ epididymalitis  . Full dentures   . 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 Hearing loss   . History of alcohol abuse    quit drinking in the mid 80's  . History of cirrhosis of liver    alcoholic--  hx alcohol abuse -- quit drinking 1980's  . History of gout   . History of melanoma excision    2013--  left ear lobe and back of hand  . History of pancreatitis    2008  . History of rectal cancer oncologist-  dr Benay Spice--  no recurrence   dx Sept 2009--  Stage II (T3N0)  s/p  sigmoid colectomy & low anterior resection 04-18-2008  and chemoradiation 2010  . History of shingles 07/27/2010  . History of squamous cell carcinoma excision    left lower leg  . Hyperlipidemia   . Hypertension   . Loose stools    DUE TO ANTIBIOTICS  . Macular degeneration    not sure which eye  . OA (osteoarthritis)   . RBBB (right bundle branch block)   . Renal lesion    chronic-- left side  . Sciatica of right side   . Urge urinary incontinence    intermittant   Past Surgical History:  Procedure Laterality Date  . CARDIAC CATHETERIZATION  2001  approx.  in [place]  . CATARACT EXTRACTION W/ INTRAOCULAR LENS  IMPLANT, BILATERAL    . COLONOSCOPY  last one 06-27-2012  . EXPLORATORY LAPAROTOMY/  LOW ANTERIOR RESECTION/  SIGMOID COLECTOMY  04-18-2008   DR Dalbert Batman  . INCISION AND DRAINAGE ABSCESS N/A 11/02/2014   Procedure: INCISION AND DRAINAGE OF SCROTUM;  Surgeon: Ardis Hughs, MD;  Location[facility];  Service: Urology;  Laterality: N/A;  . INGUINAL HERNIA REPAIR Right 03/23/2014   Procedure: HERNIA REPAIR INGUINAL ADULT OPEN REPAIR RIGHT INGUINAL HERNIA REPAIR;  Surgeon: Fanny Skates, MD;  Location: [place];  Service: General;  Laterality: Right;  . INSERTION OF MESH Right 03/23/2014   Procedure: INSERTION OF MESH;  Surgeon: Fanny Skates, MD;  Location: [facility];  Service: General;  Laterality: Right;  . LAPAROSCOPIC CHOLECYSTECTOMY  1990's  . MASS EXCISION N/A 11/02/2014   Procedure: EXCISION OF EPIDIDYMAL CYST;  Surgeon: Ardis Hughs, MD;  Location[facility];  Service: Urology;  Laterality: N/A;  . TONSILLECTOMY  as child  . TRANSTHORACIC ECHOCARDIOGRAM  05-11-2008   pseudonormal LV filling pattern,  ef 60-65%/  mild to moderate MV calcification no stenosis w/ mild to moderate regurg./  mild LAE and RAE/  mild TR    reports that he quit smoking about 10 years ago. His smoking use included cigarettes. He has a 55.00 pack-year smoking history. He has never used smokeless tobacco. He reports that he does not drink alcohol or use drugs. family history includes Hypertension in his mother; Lymphoma in  his father and another family member; Stroke in an other family member. Allergies  Allergen Reactions  . Flexeril [Cyclobenzaprine Hcl] Other (See Comments)       caused altered mental status     Review of Systems  Constitutional: Negative for appetite change, chills, fever and unexpected weight change.  HENT: Negative for ear pain and facial swelling.   Skin: Negative for rash.  Neurological: Negative for headaches.  Hematological: Negative for adenopathy.       Objective:   Physical Exam Constitutional:      Appearance: Normal appearance.  HENT:     Head: Normocephalic and atraumatic.     Comments: No visible facial swelling.  He has definite palpable click left TMJ region and some tenderness over the TMJ joint No parotid gland swelling    Right Ear: Tympanic membrane and ear canal normal. There is no impacted cerumen.     Left Ear: Tympanic membrane and ear canal normal. There is no impacted cerumen.     Mouth/Throat:     Pharynx: Oropharynx is clear. No oropharyngeal exudate.  Neck:     Musculoskeletal: Neck supple.     Comments: No lymphadenopathy.  Cardiovascular:     Rate and Rhythm: Normal rate.  Pulmonary:     Effort: Pulmonary effort is normal.     Breath sounds: Normal breath sounds.  Lymphadenopathy:     Cervical: No cervical adenopathy.  Neurological:     Mental Status: He is alert.        Assessment:     Persistent left TMJ symptoms.  He obtained transient relief with low-dose prednisone but no relief with baclofen.    Plan:     -Continue soft diet -Try some icing 15 to 20 minutes 2-3 times daily for symptom relief -Avoid nonsteroidals with his age and Coumadin therapy -We did agree to 1 refill of low-dose prednisone 20 mg daily for 7 days -Consider setting up referral to oral surgeon who deals with TMJ issues  Eulas Post MD [facility]

## 2018-06-28 ENCOUNTER — Ambulatory Visit: Payer: Medicare Other | Admitting: Family Medicine

## 2018-06-28 ENCOUNTER — Encounter: Payer: Self-pay | Admitting: Family Medicine

## 2018-06-28 VITALS — BP 138/80 | Temp 97.7°F | Ht 71.0 in | Wt 166.0 lb

## 2018-06-28 DIAGNOSIS — M26609 Unspecified temporomandibular joint disorder, unspecified side: Secondary | ICD-10-CM

## 2018-06-28 NOTE — Patient Instructions (Signed)
Continue baclofen up to twice a day as needed for TMJ pain  Schedule a visit with Dr. Kayleen Memos of PT here in our building- she will work you through the exercises you can do  If not doing better with that- can ask Dr. Noah Charon - a local dentist who treats TMJ- what type of cost might be involved in an evaluation

## 2018-06-28 NOTE — Progress Notes (Signed)
Phone [phone]   Subjective:  William Mahoney is a 85 y.o. year old very pleasant male patient who presents for/with See problem oriented charting ROS-no fever, chills.  Rare headaches which are really bothering him.  Complains of left jaw pain-worse with opening his mouth.  Complains of clicking and popping with opening his mouth.  Past Medical History-  Patient Active Problem List   Diagnosis Date Noted  . Edema 11/10/2016    Priority: High  . Atrial fibrillation ([place]) 12/07/2008    Priority: High  . Insomnia 10/19/2014    Priority: Medium  . Gout 01/09/2014    Priority: Medium  . Malignant neoplasm of rectosigmoid junction ([place]) 01/04/2008    Priority: Medium  . Former smoker 06/23/2007    Priority: Medium  . HYPERLIPIDEMIA 02/09/2007    Priority: Medium  . Essential hypertension 12/07/2006    Priority: Medium  . COPD 12/07/2006    Priority: Medium  . BPH (benign prostatic hyperplasia) 12/07/2006    Priority: Medium  . Allergic rhinitis 08/15/2013    Priority: Low  . 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 Inguinal hernia 03/21/2010    Priority: Low  . BASAL CELL CARCINOMA SKIN LOWER LIMB INCL HIP 03/21/2010    Priority: Low  . Actinic keratosis 12/07/2008    Priority: Low  . OSTEOARTHRITIS 02/09/2007    Priority: Low  . PEPTIC ULCER DISEASE 12/07/2006    Priority: Low  . URINARY INCONTINENCE 12/07/2006    Priority: Low  . PANCREATITIS, HX OF 12/07/2006    Priority: Low  . Thrombocytopenia ([place]) 11/10/2016  . Encounter for therapeutic drug monitoring 08/27/2014    Medications- reviewed and updated Current Outpatient Medications  Medication Sig Dispense Refill  . allopurinol (ZYLOPRIM) 300 MG tablet TAKE ONE-HALF TABLET BY MOUTH IN THE EVENING 45 tablet 5  . baclofen (LIORESAL) 10 MG tablet Take 1 tablet (10 mg total) by mouth 3 (three) times daily. 90 each 0  . colchicine 0.6 MG tablet Take 0.6 mg by mouth 2 (two) times daily as needed  (gout).    . fish oil-omega-3 fatty acids 1000 MG capsule Take 1 g by mouth daily.    . fluticasone (FLONASE) 50 MCG/ACT nasal spray Place 2 sprays into both nostrils daily. 16 g 6  . furosemide (LASIX) 20 MG tablet Take 20 mg by mouth daily.    . metoprolol tartrate (LOPRESSOR) 25 MG tablet TAKE 1/2 TABLET BY MOUTH 2 TIMES DAILY 90 tablet 3  . Multiple Vitamin (MULTIVITAMIN) tablet Take 1 tablet by mouth daily.    . predniSONE (DELTASONE) 20 MG tablet Take 1 tablet by mouth daily for 5 days, then 1/2 tablet daily for 2 days 6 tablet 0  . predniSONE (DELTASONE) 20 MG tablet Take one daily for 7 days. 7 tablet 0  . traMADol (ULTRAM) 50 MG tablet Take 1 tablet (50 mg total) by mouth every 8 (eight) hours as needed for moderate pain or severe pain (dont drive for 8 hours after atking). 20 tablet 0  . triamterene-hydrochlorothiazide (MAXZIDE-25) 37.5-25 MG tablet Take 0.5 tablets by mouth daily. 45 tablet 3  . warfarin (COUMADIN) 2.5 MG tablet TAKE AS DIRECTED BY ANTICOAGULATION CLINIC 40 tablet 3  . zolpidem (AMBIEN) 5 MG tablet TAKE 1/2 (ONE-HALF) TABLET BY MOUTH AT BEDTIME AS NEEDED FOR SLEEP 15 tablet 5  . amLODipine (NORVASC) 2.5 MG tablet Take 1 tablet (2.5 mg total) by mouth daily. 90 tablet 3   No current facility-administered  medications for this visit.      Objective:  BP 138/80 (BP Location: Left Arm, Patient Position: Sitting, Cuff Size: Normal)   Temp 97.7 F (36.5 C) (Oral)   Ht 5\' 11"  (1.803 m)   Wt 166 lb (75.3 kg)   BMI 23.15 kg/m  Gen: NAD, resting comfortably CV: RRR no murmurs rubs or gallops Mucous membranes moist.  Patient does have a consistent clicking/popping with opening jaw fully and this worsens his pain. Lungs: CTAB no crackles, wheeze, rhonchi Abdomen: soft/nontender/nondistended Ext: no edema Skin: warm, dry Neuro: Gait and speech normal    Assessment and Plan   Left jaw pain/in front of left ear S: Patient was seen in mid December and diagnosed with  left otitis media with effusion-we tried Claritin and Flonase and he noted resolution of symptoms initially but later symptoms restarted and did not respond to these medications.  After Christmas pain shifted more to his jaw-worse with chewing.  He had noted a clicking and popping sound when he open his mouth all the way.  Has had some fluctuations in symptoms.  We trialed low-dose prednisone in early January-I discussed possible ENT follow-up.  Saw Dr. Rogers Blocker on 06/15/2018 and prescribed baclofen- helpful temporarily just like the prednisone.   He saw dentist- patient is debating getting teeth replaced through his dentist.   He has been doing jaw stretches intermittent.   He saw Dr. Elease Hashimoto who recommended icing and did a refill on prednisone- didn't try the gentile icing yet. Does not chew gum. Sometimes has no issues- other times really bothers him.   No exertional component to pain- no shortness of breath reported either   It was going to be out of pocket to see oral surgeon- they estimated over $1000 for first visit.   Continues to have clicking/popping in jaw that worsens pain with opening/closing but pain is persistent in between as well- with an aching sensation. Pain is improved on the baclofen- using twice a day mainly.  Pain does radiate down his jaw some. A/P: TMJ- poor control. Next steps below.  Consider updating renal function next visit.  He feels he needs help with exercises as we have not given him much instruction-he is open to PT referral. From [facility] "  Patient Instructions  Continue baclofen up to twice a day as needed for TMJ pain  Schedule a visit with Dr. Kayleen Memos of PT here in our building- she will work you through the exercises you can do  If not doing better with that- can ask Dr. Noah Charon - a local dentist who treats TMJ- what type of cost might be involved in an evaluation    "  Future Appointments  Date Time Provider [place]  07/20/2018 10:30 AM  [facility]    Lab/Order associations: TMJ (temporomandibular joint disorder) - Plan: Ambulatory referral to Physical Therapy  Return precautions advised.  [name], MD

## 2018-07-06 ENCOUNTER — Telehealth: Payer: Self-pay

## 2018-07-06 ENCOUNTER — Ambulatory Visit: Payer: Medicare Other | Admitting: Physical Therapy

## 2018-07-06 NOTE — Telephone Encounter (Signed)
Please call patient and inform him that we placed a referral and that he declined a referral when they called- please give him their number so he can call if he changes his mind.

## 2018-07-06 NOTE — Telephone Encounter (Signed)
FYI Spoke to pt daughter Margreta Journey and it looks like the phone note had already been taking care. Pt called regarding the referral. When someone reached out to him regarding the referral information, pt declined and wanted tot postponed due to Covid-19. Pt was already aware that he could call and schedeule the appt himself when he is ready. Margreta Journey will call and help pt with this. No further action needed!

## 2018-07-06 NOTE — Telephone Encounter (Signed)
Copied from [place] [id number]. Topic: General - Other >> Jul 05, 2018  9:40 AM Carolyn Stare wrote:  Pt call to ask if DR HUnter was still going to refer him for TMJ cause he has not heard anything    Per referral note, pt declined referral. Pt stated that he did not want to leave his house.   Referral postponed.

## 2018-07-12 ENCOUNTER — Other Ambulatory Visit: Payer: Self-pay | Admitting: Family Medicine

## 2018-07-12 ENCOUNTER — Telehealth: Payer: Self-pay | Admitting: Family Medicine

## 2018-07-12 MED ORDER — TRAMADOL HCL 50 MG PO TABS: 50.0000 mg | ORAL_TABLET | Freq: Three times a day (TID) | ORAL | 0 refills | Status: DC | PRN

## 2018-07-12 MED ORDER — ALLOPURINOL 300 MG PO TABS: ORAL_TABLET | ORAL | 1 refills | Status: DC

## 2018-07-12 MED ORDER — AMLODIPINE BESYLATE 2.5 MG PO TABS: 2.5000 mg | ORAL_TABLET | Freq: Every day | ORAL | 1 refills | Status: DC

## 2018-07-12 MED ORDER — BACLOFEN 10 MG PO TABS: 10.0000 mg | ORAL_TABLET | Freq: Three times a day (TID) | ORAL | 0 refills | Status: DC

## 2018-07-12 NOTE — Telephone Encounter (Signed)
See note

## 2018-07-12 NOTE — Telephone Encounter (Signed)
Copied from [place] [id number]. Topic: Quick Communication - Rx Refill/Question >> Jul 12, 2018  1:46 PM Mcneil, Ja-Kwan wrote: Medication: amLODipine (NORVASC) 2.5 MG tablet and allopurinol (ZYLOPRIM) 300 MG tablet  Has the patient contacted their pharmacy? yes   Preferred Pharmacy (with phone number or street name): [id number] - [place], [address] [phone] (Phone)  [phone] (Fax)  Agent: Please be advised that RX refills may take up to 3 business days. We ask that you follow-up with your pharmacy.

## 2018-07-12 NOTE — Telephone Encounter (Signed)
Last OV 06/28/2018 Last refill 06/15/2018 #90/0  Previously prescribed by Dr. Rogers Blocker, forwarding to Dr. Yong Channel to approve/deny.

## 2018-07-12 NOTE — Telephone Encounter (Signed)
Rx's refilled. 

## 2018-07-13 ENCOUNTER — Other Ambulatory Visit: Payer: Self-pay | Admitting: Family Medicine

## 2018-07-18 ENCOUNTER — Ambulatory Visit: Payer: Self-pay | Admitting: *Deleted

## 2018-07-18 ENCOUNTER — Other Ambulatory Visit: Payer: Self-pay

## 2018-07-18 ENCOUNTER — Encounter: Payer: Self-pay | Admitting: Family Medicine

## 2018-07-18 ENCOUNTER — Ambulatory Visit (INDEPENDENT_AMBULATORY_CARE_PROVIDER_SITE_OTHER): Payer: Medicare Other | Admitting: General Practice

## 2018-07-18 ENCOUNTER — Ambulatory Visit (INDEPENDENT_AMBULATORY_CARE_PROVIDER_SITE_OTHER): Payer: Medicare Other | Admitting: Family Medicine

## 2018-07-18 VITALS — BP 127/78 | Temp 98.2°F

## 2018-07-18 DIAGNOSIS — Z5181 Encounter for therapeutic drug level monitoring: Secondary | ICD-10-CM | POA: Diagnosis not present

## 2018-07-18 DIAGNOSIS — I4891 Unspecified atrial fibrillation: Secondary | ICD-10-CM

## 2018-07-18 DIAGNOSIS — H1132 Conjunctival hemorrhage, left eye: Secondary | ICD-10-CM

## 2018-07-18 LAB — POCT INR: INR: 3 (ref 2.0–3.0)

## 2018-07-18 NOTE — Patient Instructions (Signed)
Is a month and to follow viewed and and 12 area will stressed you are still

## 2018-07-18 NOTE — Telephone Encounter (Signed)
Noted  

## 2018-07-18 NOTE — Telephone Encounter (Signed)
Error in text from [facility], patient is scheduled today at 10am. See note

## 2018-07-18 NOTE — Progress Notes (Signed)
I have reviewed and agree with note, evaluation, plan. Skipping dose today- hopeful to trend closer to 2.0 on INR if possible but technically he is in appropriate range today.   [name], MD

## 2018-07-18 NOTE — Patient Instructions (Signed)
Pre visit review using our clinic review tool, if applicable. No additional management support is needed unless otherwise documented below in the visit note.  Skip dosage today (3/30) and then change dosage and take 1 tablet daily except 2 tablets on Mondays only.  Repeat INR in 3 to 4 weeks.

## 2018-07-18 NOTE — Telephone Encounter (Signed)
Pt called stating that his left eye is blood shot, and is very swollen for the last 2-3 days; he says that his eye and nose are constantly draining; he also says that he has left facial swelling; the pt says that he has been dealing with TMJ since January 2020;  His email is [email or web address]; he can be contacted at [phone] and a message can be left; recommendations made per nurse triage protocol; conference call initiated with Watt Climes at [facility]; per Watt Climes pt offered and accepted virtual appointment at 1 with Dr [name]; the pt and his daughter verbalized understanding; will route to office for notification. Reason for Disposition . MODERATE-SEVERE eyelid swelling on one side  (Exception: due to a mosquito bite)  Answer Assessment - Initial Assessment Questions 1. ONSET: "When did the swelling start?" (e.g., minutes, hours, days)     07/15/2018 2. LOCATION: "What part of the eyelids is swollen?"   Underneath left eye 3. SEVERITY: "How swollen is it?"     mild 4. ITCHING: "Is there any itching?" If so, ask: "How much?"   (Scale 1-10; mild, moderate or severe)    no 5. PAIN: "Is the swelling painful to touch?" If so, ask: "How painful is it?"   (Scale 1-10; mild, moderate or severe)     no 6. FEVER: "Do you have a fever?" If so, ask: "What is it, how was it measured, and when did it start?"      no 7. CAUSE: "What do you think is causing the swelling?"     Not sure 8. RECURRENT SYMPTOM: "Have you had eyelid swelling before?" If so, ask: "When was the last time?" "What happened that time?"     no 9. OTHER SYMPTOMS: "Do you have any other symptoms?" (e.g., blurred vision, eye discharge, rash, runny nose)     Eye discharge (clear fluid); runny nose (clear discharge) 10. PREGNANCY: "Is there any chance you are pregnant?" "When was your last menstrual period?"      n/a  Protocols used: EYE - [facility]

## 2018-07-18 NOTE — Progress Notes (Signed)
Phone [phone]   Subjective:  Virtual visit via phonenote  Our team/I connected with William Mahoney on 07/18/18 at 10:00 AM EDT by phone (patient did not have equipment for webex) and verified that I am speaking with the correct person using two identifiers.  Location patient: Home[place] Location provider: [facility], office Persons participating in the virtual visit: patient, daughter  Our team/I discussed the limitations of evaluation and management by telemedicine and the availability of in person appointments. In light of current covid-19 pandemic, patient also understands that we are trying to protect them by minimizing in office contact if at all possible.  The patient expressed consent for telemedicine visit and agreed to proceed.   ROS- no headache or blurry vision. No double vision. Occasional twinges of pain in upper stomach.    Past Medical History-  Patient Active Problem List   Diagnosis Date Noted  . Edema 11/10/2016    Priority: High  . Atrial fibrillation ([place]) 12/07/2008    Priority: High  . Insomnia 10/19/2014    Priority: Medium  . Gout 01/09/2014    Priority: Medium  . 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 Former smoker 06/23/2007    Priority: Medium  . HYPERLIPIDEMIA 02/09/2007    Priority: Medium  . Essential hypertension 12/07/2006    Priority: Medium  . COPD 12/07/2006    Priority: Medium  . BPH (benign prostatic hyperplasia) 12/07/2006    Priority: Medium  . Allergic rhinitis 08/15/2013    Priority: Low  . 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 Inguinal hernia 03/21/2010    Priority: Low  . BASAL CELL CARCINOMA SKIN LOWER LIMB INCL HIP 03/21/2010    Priority: Low  . Actinic keratosis 12/07/2008    Priority: Low  . OSTEOARTHRITIS 02/09/2007    Priority: Low  . PEPTIC ULCER DISEASE 12/07/2006    Priority: Low  . URINARY INCONTINENCE 12/07/2006    Priority: Low  .  PANCREATITIS, HX OF 12/07/2006    Priority: Low  . Thrombocytopenia ([place]) 11/10/2016  . Encounter for therapeutic drug monitoring 08/27/2014    Medications- reviewed and updated Current Outpatient Medications  Medication Sig Dispense Refill  . allopurinol (ZYLOPRIM) 300 MG tablet TAKE ONE-HALF TABLET BY MOUTH IN THE EVENING 45 tablet 1  . amLODipine (NORVASC) 2.5 MG tablet Take 1 tablet (2.5 mg total) by mouth daily. 90 tablet 1  . baclofen (LIORESAL) 10 MG tablet Take 1 tablet (10 mg total) by mouth 3 (three) times daily. 90 each 0  . baclofen (LIORESAL) 10 MG tablet Take 1 tablet (10 mg total) by mouth 3 (three) times daily. 90 each 0  . colchicine 0.6 MG tablet Take 0.6 mg by mouth 2 (two) times daily as needed (gout).    . fish oil-omega-3 fatty acids 1000 MG capsule Take 1 g by mouth daily.    . fluticasone (FLONASE) 50 MCG/ACT nasal spray Place 2 sprays into both nostrils daily. 16 g 6  . furosemide (LASIX) 20 MG tablet Take 20 mg by mouth daily.    . metoprolol tartrate (LOPRESSOR) 25 MG tablet TAKE 1/2 TABLET BY MOUTH 2 TIMES DAILY 90 tablet 3  . Multiple Vitamin (MULTIVITAMIN) tablet Take 1 tablet by mouth daily.    . predniSONE (DELTASONE) 20 MG tablet Take 1 tablet by mouth daily for 5 days, then 1/2 tablet daily for 2 days 6 tablet 0  . predniSONE (DELTASONE) 20 MG tablet Take  one daily for 7 days. 7 tablet 0  . traMADol (ULTRAM) 50 MG tablet Take 1 tablet (50 mg total) by mouth every 8 (eight) hours as needed for moderate pain or severe pain (dont drive for 8 hours after atking). 20 tablet 0  . triamterene-hydrochlorothiazide (MAXZIDE-25) 37.5-25 MG tablet Take 0.5 tablets by mouth daily. 45 tablet 3  . warfarin (COUMADIN) 2.5 MG tablet TAKE AS DIRECTED BY ANTICOAGULATION CLINIC 40 tablet 3  . zolpidem (AMBIEN) 5 MG tablet TAKE 1/2 (ONE-HALF) TABLET BY MOUTH AT BEDTIME AS NEEDED FOR SLEEP 15 tablet 5   No current facility-administered medications for this visit.       Objective:  BP 127/78   Temp 98.2 F (36.8 C)   SpO2 93%  Gen: NAD, resting comfortably Eye: around left eye there is erythema. Daughter reports no warmth or tenderness Lungs: nonlabored, normal respiratory rate  Skin: warm, dry, no obvious rash   Picture from [facility] taken of webex camera image. Pupil appeared circular when I was chatting with patient- appears slightly distorted in above image     Assessment and Plan   #Subconjunctival hemorrhage S: noted eye redness on Friday morning. Denies fall or trauma. Eye seems to be getting more red-also more red around the eye-this area could be bruising. Uses ice/heat pack on jaw bone for TMJ. Has tried cool compresses on the eye. Denies pain.   Doesn't itch- eyes always water so this is not worse.   Fortunately patient was able to get an early morning visit with Dr. Kathlen Mody of ophthalmology  Patient missed most recent INR check due to COVID-19. Coumadin takes 1 pill of 2.5 mg a day and then twice a week takes two (Wednesday and Sunday)  A/P: Patient appears to have subconjunctival hemorrhage. Appears rather extensive and also has what appears to be bruising around the eye. I advised patient to keep optho appointment tomorrow. There is a possibility of preseptal cellulitis but I think that is less likely.  - we are going to hold 1 dose of coumadin today. - will see if we can do an INR car check if at all possible- will reach out to Villa Herb, RN- target INR closer to 2  Lab/Order associations: Subconjunctival hemorrhage of left eye  Return precautions advised.  [name], MD

## 2018-07-20 ENCOUNTER — Ambulatory Visit: Payer: Medicare Other

## 2018-08-01 ENCOUNTER — Ambulatory Visit: Payer: Medicare Other | Admitting: Family Medicine

## 2018-08-24 ENCOUNTER — Ambulatory Visit: Payer: Medicare Other

## 2018-09-02 ENCOUNTER — Ambulatory Visit: Payer: Medicare Other | Admitting: Family Medicine

## 2018-09-12 ENCOUNTER — Telehealth: Payer: Self-pay | Admitting: Internal Medicine

## 2018-09-12 NOTE — Telephone Encounter (Signed)
Received call from [facility] with call center.  Mr Strawder's daughter Cyril Mourning) called concerned that pt may have COVID.   Ernest Haber lives with her mother and her father.  States that her son was in their house helping out and found out after he was there - that he had been exposed to someone who tested positive for COVID.  Her grandson (age 83) has been sick with cough, fatigue and low grade fever.  Was tested for COVID today.  No results yet.  Reports her father felt fine today.  Tonight, he started having shaking chills and fever - with Tmax of 101.6.  He ate his evening meal and drank a chocolate frosty.  No other symptoms.  No headache.  No cough, congestion, chest tightness or sob.  She gave him tylenol (1810).  Temperature now 101.  No chills now.  Discussed COVID and treating symptoms.  Discussed keeping him hydrated, cold cloths, etc.  Discussed the need to try to social distance as much as possible within the house. Discussed the need to self quarantine.   Discussed testing.  She desires for him to be tested, but does not feel he needs evaluation currently.  She will monitor him closely tonight.  Any changes or worsening symptoms, will have him evaluated.  Notified [facility] for testing.  Daughter comfortable with plan.

## 2018-09-13 ENCOUNTER — Other Ambulatory Visit: Payer: Medicare Other

## 2018-09-13 ENCOUNTER — Other Ambulatory Visit: Payer: Self-pay | Admitting: Family Medicine

## 2018-09-13 ENCOUNTER — Telehealth: Payer: Self-pay | Admitting: Family Medicine

## 2018-09-13 DIAGNOSIS — Z20822 Contact with and (suspected) exposure to covid-19: Secondary | ICD-10-CM

## 2018-09-13 NOTE — Telephone Encounter (Addendum)
Pt's daughter Vania Rea called and was given appointment time,  address, directions, and instructions that the pt and all occupants of the vehicle should wear a mask; she verbalizes understanding.

## 2018-09-13 NOTE — Addendum Note (Signed)
Addended by: Addison Naegeli on: 09/13/2018 07:37 AM   Modules accepted: Orders

## 2018-09-13 NOTE — Addendum Note (Signed)
Addended by: [place] on: 09/13/2018 08:20 AM   Modules accepted: Orders

## 2018-09-13 NOTE — Telephone Encounter (Signed)
FYI.  I called this am to check on pt.  Spoke with Vania Rea (his daughter).  Temperature was 99.8 this am.  He was doing ok.  No progression of symptoms.  He was going at 10:00 for COVID testing.

## 2018-09-13 NOTE — Telephone Encounter (Addendum)
Contacted pt to schedule COVID testing per Dr Einar Pheasant; pt offered and accepted appointment at [facility] site 09/13/2018 at 1000; pt given address, directions, and instructions that all occupants of the vehicle should wear a masks; he verbalizes understanding; orders placed per protocol; will route to provider for notification.

## 2018-09-13 NOTE — Telephone Encounter (Signed)
You are too kind! Just getting caught up this morning- thanks for caring for him!

## 2018-09-14 LAB — NOVEL CORONAVIRUS, NAA: SARS-CoV-2, NAA: NOT DETECTED

## 2018-09-28 ENCOUNTER — Other Ambulatory Visit: Payer: Self-pay

## 2018-09-28 MED ORDER — ZOLPIDEM TARTRATE 5 MG PO TABS: ORAL_TABLET | ORAL | 5 refills | Status: DC

## 2018-09-28 NOTE — Telephone Encounter (Signed)
Rx request Last fill 02/210/20  #15/5 Last OV  07/18/18

## 2018-09-30 ENCOUNTER — Other Ambulatory Visit: Payer: Self-pay | Admitting: Family Medicine

## 2018-09-30 NOTE — Telephone Encounter (Signed)
Last OV 07/18/18 Last refill 12/06/17 #40/3 Next OV not scheduled

## 2018-10-12 ENCOUNTER — Other Ambulatory Visit: Payer: Self-pay | Admitting: *Deleted

## 2018-10-12 MED ORDER — AMLODIPINE BESYLATE 2.5 MG PO TABS: 2.5000 mg | ORAL_TABLET | Freq: Every day | ORAL | 1 refills | Status: DC

## 2018-10-27 ENCOUNTER — Telehealth: Payer: Self-pay

## 2018-10-27 NOTE — Telephone Encounter (Signed)
I think the likelihood of covid-19 is low but to be on the safe side let us bring him into the side door and use 1 of the old sports medicine rooms.  I would wear a face shield to be on the safe side-also practice enteric precautions- handwashing over hand sanitizer  Make sure-No fever, chills, cough, congestion, runny nose, shortness of breath, new fatigue, body aches, sore throat, headache, nausea, vomiting or new loss of taste or smell. No known contacts with covid 19 or someone being tested for covid 19.

## 2018-10-27 NOTE — Telephone Encounter (Addendum)
Pt scheduled for visit tomorrow for abd pain, requires triage.   Pt c/o bilateral upper abd pain x 5-6 days, worsening over the past 2-3 days. Pain is described as constant aching like a pulled muscle. Pain is rated as moderate. Pain is worse with sit-to-stand and stand-to-sit. Denies increased flatulence, fever, blood in the stool, tar-like or coffee-grind appearing stool, n/v, rash, fatigue, achiness. Reports diarrhea x 3-4 days. Also reports worsening incontinence .   OK to keep appointment for tomorrow as scheduled?

## 2018-10-28 ENCOUNTER — Encounter: Payer: Self-pay | Admitting: Family Medicine

## 2018-10-28 ENCOUNTER — Ambulatory Visit (INDEPENDENT_AMBULATORY_CARE_PROVIDER_SITE_OTHER): Payer: Medicare Other | Admitting: Family Medicine

## 2018-10-28 VITALS — BP 138/89 | HR 59 | Temp 98.1°F | Ht 71.0 in

## 2018-10-28 DIAGNOSIS — I10 Essential (primary) hypertension: Secondary | ICD-10-CM | POA: Diagnosis not present

## 2018-10-28 DIAGNOSIS — I4891 Unspecified atrial fibrillation: Secondary | ICD-10-CM

## 2018-10-28 DIAGNOSIS — R1084 Generalized abdominal pain: Secondary | ICD-10-CM

## 2018-10-28 DIAGNOSIS — C19 Malignant neoplasm of rectosigmoid junction: Secondary | ICD-10-CM | POA: Diagnosis not present

## 2018-10-28 DIAGNOSIS — R7989 Other specified abnormal findings of blood chemistry: Secondary | ICD-10-CM

## 2018-10-28 DIAGNOSIS — D696 Thrombocytopenia, unspecified: Secondary | ICD-10-CM

## 2018-10-28 DIAGNOSIS — Z7901 Long term (current) use of anticoagulants: Secondary | ICD-10-CM

## 2018-10-28 LAB — POC URINALSYSI DIPSTICK (AUTOMATED)
Bilirubin, UA: NEGATIVE
Blood, UA: NEGATIVE
Glucose, UA: NEGATIVE
Ketones, UA: NEGATIVE
Leukocytes, UA: NEGATIVE
Nitrite, UA: NEGATIVE
Protein, UA: NEGATIVE
Spec Grav, UA: 1.015 (ref 1.010–1.025)
Urobilinogen, UA: 0.2 E.U./dL
pH, UA: 7 (ref 5.0–8.0)

## 2018-10-28 NOTE — Telephone Encounter (Signed)
Called pt and advised. He will call when he arrives and I will meet him at the side door.

## 2018-10-28 NOTE — Progress Notes (Signed)
Phone [phone]   Subjective:  William Mahoney is a 85 y.o. year old very pleasant male patient who presents for/with See problem oriented charting Chief Complaint  Patient presents with   Abdominal Pain    Pt present for week long abdomen pain. Pt denies localized pain. PT has tried GasX, Tylenol and a muscle relaxer he takes for TMJ   Temporomandibular Joint Pain    Sx 83months     History of BPH    Pt also c/o urinary leakage    ROS- No fever, chills, cough,  runny nose, shortness of breath, new  Fatigue,new  body aches, sore throat, headache, nausea, vomiting,  or new loss of taste or smell. No known contacts with covid 19 or someone being tested for covid 19.   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    Priority: Medium   Former smoker 06/23/2007    Priority: Medium   HYPERLIPIDEMIA 02/09/2007    Priority: Medium   Essential hypertension 12/07/2006    Priority: Medium   COPD 12/07/2006    Priority: Medium   BPH (benign prostatic hyperplasia) 12/07/2006    Priority: Medium   Allergic rhinitis 08/15/2013    Priority: Low   Arthritis of lumbar spine 01/20/2013    Priority: Low   Inguinal hernia 03/21/2010    Priority: Low   BASAL CELL CARCINOMA SKIN LOWER LIMB INCL HIP 03/21/2010    Priority: Low   Actinic keratosis 12/07/2008    Priority: Low   OSTEOARTHRITIS 02/09/2007    Priority: Low   PEPTIC ULCER DISEASE 12/07/2006    Priority: Low   URINARY INCONTINENCE 12/07/2006    Priority: Low   PANCREATITIS, HX OF 12/07/2006    Priority: Low   Thrombocytopenia ([place]) 11/10/2016   Encounter for therapeutic drug monitoring 08/27/2014    Medications- reviewed and  updated Current Outpatient Medications  Medication Sig Dispense Refill   allopurinol (ZYLOPRIM) 300 MG tablet TAKE ONE-HALF TABLET BY MOUTH IN THE EVENING 45 tablet 1   amLODipine (NORVASC) 2.5 MG tablet Take 1 tablet (2.5 mg total) by mouth daily. 90 tablet 1   baclofen (LIORESAL) 10 MG tablet Take 1 tablet (10 mg total) by mouth 3 (three) times daily. 90 each 0   colchicine 0.6 MG tablet Take 0.6 mg by mouth 2 (two) times daily as needed (gout).     fish oil-omega-3 fatty acids 1000 MG capsule Take 1 g by mouth daily.     fluticasone (FLONASE) 50 MCG/ACT nasal spray Place 2 sprays into both nostrils daily. 16 g 6   furosemide (LASIX) 20 MG tablet TAKE 1 TABLET BY MOUTH EVERY DAY AS NEEDED 30 tablet 0   metoprolol tartrate (LOPRESSOR) 25 MG tablet TAKE 1/2 TABLET BY MOUTH 2 TIMES DAILY 90 tablet 3   Multiple Vitamin (MULTIVITAMIN) tablet Take 1 tablet by mouth daily.     traMADol (ULTRAM) 50 MG tablet Take 1 tablet (50 mg total) by mouth every 8 (eight) hours as needed for moderate pain or severe pain (dont drive for 8 hours after atking). 20 tablet 0   triamterene-hydrochlorothiazide (MAXZIDE-25) 37.5-25 MG tablet Take 0.5 tablets by mouth daily. 45 tablet 3   warfarin (  COUMADIN) 2.5 MG tablet TAKE AS DIRECTED BY CLINIC 40 tablet 1   zolpidem (AMBIEN) 5 MG tablet TAKE 1/2 (ONE-HALF) TABLET BY MOUTH AT BEDTIME AS NEEDED FOR SLEEP 15 tablet 5   No current facility-administered medications for this visit.      Objective:  BP 138/89    Pulse (!) 59    Temp 98.1 F (36.7 C) (Oral)    Ht 5\' 11"  (1.803 m)    SpO2 98%    BMI 23.15 kg/m  Gen: NAD, resting comfortably CV: RRR no murmurs rubs or gallops Lungs: CTAB no crackles, wheeze, rhonchi Abdomen: Diastases recti noted.  Patient with diffuse mild tenderness throughout abdomen.  No discrete area of pain Ext: Trace edema Skin: warm, dry Neuro: Normal speech    Assessment and Plan   # diffuse abdominal pain Patient with  history of malignant neoplasm of rectosigmoid junction status post resection, radiation, chemotherapy-previously released by oncology S: feels like "stomach muscles" are really hurting for the last week. Started out aching and then got really intense- couldn't bend/move without it getting better. Today back down to aching more mild pain but is steady. Feels like "its on the outside" both sides. For 2 days couldn't do much at all even walking- that was yesterday. 2-3 days in a row had some diarrhea but that resolved- about 3 days ago. Tried gas x and tylenol as well as old muscle relaxant baclofen- mild help with abdominal muscles and TMJ.  Has ongoing issues with TMJ-could refer to ENT if needed in the future.  Does have ankle swelling- he is taking lasix again. Notes some phlegm in throat and chocking type cough like something caught in throat intermittently. Has some urinary incontinence but not new- worse with duretic which he is using more.   Patient denies fall or injury that would have triggered this A/P: 85 year old male with history of colon/rectal cancer and now with diffuse abdominal pain for a week which he describes as "muscular"- with history told patient that we needed to get a CT scan of the abdomen pelvis to make sure no recurrence of cancer-he is in agreement.  Patient does have diastases recti but I do not think that is the cause of his pain-no fall or injury to account for muscular strain  We will update labs today to include CBC, CMP, lipase as well.  He also has an upcoming PT/INR visit and asked me to draw an INR with labs.  Finally he has had an elevated TSH in the past so we opted to repeat that as well to make sure not trending up.  #Atrial fibrillation/thrombocytopenia S: Patient on metoprolol for rate control.  Patient is on anticoagulation with Coumadin.  Patient does have history of thrombocytopenia A/P: Appears stable-continue current medications.  With long-term Coumadin  use I also think CT abdomen pelvis valuable to rule out intra-abdominal bleed-for CBC not completed at this time -Patient with history of thrombocytopenia intermittently but not present on labs today  #hypertension S: controlled but high normal on amlodipine 2.5 mg, metoprolol half tablet of 25 mg twice daily, Maxide 25.  Patient has had some swelling recently and has required Lasix as well BP Readings from Last 3 Encounters:  10/28/18 138/89  07/18/18 127/78  06/28/18 138/80  A/P:  Stable. Continue current medications.    Future Appointments  Date Time Provider [place]  12/30/2018 10:00 AM Lorretta Harp, MD [facility]   Lab/Order associations:   ICD-10-CM   1.  Diffuse abdominal pain  R10.84 POCT Urinalysis Dipstick (Automated)    CT Abdomen Pelvis W Contrast    POCT Urinalysis Dipstick (Automated)    Lipase    Comprehensive metabolic panel    CBC with Differential/Platelet    CBC with Differential/Platelet    Comprehensive metabolic panel    Lipase    CANCELED: CBC with Differential/Platelet    CANCELED: Comprehensive metabolic panel    CANCELED: Lipase  2. Malignant neoplasm of rectosigmoid junction (HCC)  C19 POCT Urinalysis Dipstick (Automated)    CT Abdomen Pelvis W Contrast    POCT Urinalysis Dipstick (Automated)    Lipase    Comprehensive metabolic panel    CBC with Differential/Platelet    CBC with Differential/Platelet    Comprehensive metabolic panel    Lipase    CANCELED: CBC with Differential/Platelet    CANCELED: Comprehensive metabolic panel    CANCELED: Lipase  3. Atrial fibrillation, unspecified type ([place])  I48.91   4. Essential hypertension  I10   5. Thrombocytopenia (HCC) Chronic D69.6   6. Current use of long term anticoagulation  Z79.01 Protime-INR    Protime-INR    CANCELED: Protime-INR  7. Elevated TSH  R79.89 TSH    TSH    CANCELED: TSH   Return precautions advised.  [name], MD

## 2018-10-28 NOTE — Patient Instructions (Addendum)
Please stop by lab before you go If you do not have mychart- we will call you about results within 5 business days of [place] receiving them.  If you have mychart- we will send your results within 3 business days of [place] receiving them.  If abnormal or we want to clarify a result, we will call or mychart you to make sure you receive the message.  If you have questions or concerns or don't hear within 5-7 days, please send [place] a message or call us.   We will call you within two weeks about your referral to CT scan of the abdomen (I know you think this is in the muscles but I want to make sure no deeper issues with history of rectal cancer). If you do not hear within 3 weeks, give [place] a call.   Our team will call you for phq2/depression screen

## 2018-10-29 LAB — CBC WITH DIFFERENTIAL/PLATELET
Absolute Monocytes: 817 cells/uL (ref 200–950)
Basophils Absolute: 49 cells/uL (ref 0–200)
Basophils Relative: 0.8 %
Eosinophils Absolute: 128 cells/uL (ref 15–500)
Eosinophils Relative: 2.1 %
HCT: 46 % (ref 38.5–50.0)
Hemoglobin: 15.1 g/dL (ref 13.2–17.1)
Lymphs Abs: 1702 cells/uL (ref 850–3900)
MCH: 30.1 pg (ref 27.0–33.0)
MCHC: 32.8 g/dL (ref 32.0–36.0)
MCV: 91.8 fL (ref 80.0–100.0)
MPV: 11.4 fL (ref 7.5–12.5)
Monocytes Relative: 13.4 %
Neutro Abs: 3404 cells/uL (ref 1500–7800)
Neutrophils Relative %: 55.8 %
Platelets: 147 10*3/uL (ref 140–400)
RBC: 5.01 10*6/uL (ref 4.20–5.80)
RDW: 13.2 % (ref 11.0–15.0)
Total Lymphocyte: 27.9 %
WBC: 6.1 10*3/uL (ref 3.8–10.8)

## 2018-10-29 LAB — PROTIME-INR
INR: 2.7 — ABNORMAL HIGH
Prothrombin Time: 26 s — ABNORMAL HIGH (ref 9.0–11.5)

## 2018-10-29 LAB — COMPREHENSIVE METABOLIC PANEL
AG Ratio: 1.9 (calc) (ref 1.0–2.5)
ALT: 12 U/L (ref 9–46)
AST: 21 U/L (ref 10–35)
Albumin: 4.5 g/dL (ref 3.6–5.1)
Alkaline phosphatase (APISO): 63 U/L (ref 35–144)
BUN/Creatinine Ratio: 18 (calc) (ref 6–22)
BUN: 21 mg/dL (ref 7–25)
CO2: 27 mmol/L (ref 20–32)
Calcium: 10 mg/dL (ref 8.6–10.3)
Chloride: 101 mmol/L (ref 98–110)
Creat: 1.19 mg/dL — ABNORMAL HIGH (ref 0.70–1.11)
Globulin: 2.4 g/dL (calc) (ref 1.9–3.7)
Glucose, Bld: 85 mg/dL (ref 65–99)
Potassium: 4 mmol/L (ref 3.5–5.3)
Sodium: 141 mmol/L (ref 135–146)
Total Bilirubin: 0.6 mg/dL (ref 0.2–1.2)
Total Protein: 6.9 g/dL (ref 6.1–8.1)

## 2018-10-29 LAB — EXTRA SPECIMEN

## 2018-10-29 LAB — TSH: TSH: 4.42 mIU/L (ref 0.40–4.50)

## 2018-10-29 LAB — SPECIMEN COMPROMISED

## 2018-10-29 LAB — LIPASE: Lipase: 22 U/L (ref 7–60)

## 2018-10-31 ENCOUNTER — Telehealth: Payer: Self-pay | Admitting: Family Medicine

## 2018-10-31 ENCOUNTER — Ambulatory Visit (INDEPENDENT_AMBULATORY_CARE_PROVIDER_SITE_OTHER): Payer: Medicare Other | Admitting: General Practice

## 2018-10-31 DIAGNOSIS — Z5181 Encounter for therapeutic drug level monitoring: Secondary | ICD-10-CM

## 2018-10-31 DIAGNOSIS — I4891 Unspecified atrial fibrillation: Secondary | ICD-10-CM

## 2018-10-31 NOTE — Patient Instructions (Addendum)
Pre visit review using our clinic review tool, if applicable. No additional management support is needed unless otherwise documented below in the visit note.  Continue to take 1 tablet daily except 1 1/2 tablets on Wednesday and Saturdays.  RE-check in 6 weeks.  Dosing info given to patient and he did verbalize understanding.

## 2018-10-31 NOTE — Telephone Encounter (Signed)
I called and spoke with the patient who clarified he called [facility] already and that [facility] said they were booked. Please reach out to [facility] or another location to see about getting the patient seen sooner as he is concerned about waiting 2 weeks. Patient is expecting your call. Thank you  Copied from [place] [phone]. Topic: Appointment Scheduling - Scheduling Inquiry for Clinic >> Oct 31, 2018 11:42 AM Richardo Priest, NT wrote: Reason for CRM: Patient is calling in stating he is needing his CT scan appointment moved up. States he does not want to be in discomfort for 2 weeks. Call back is [phone]. >> Oct 31, 2018 11:48 AM Carole Binning B wrote: Patient needs to call CT to reschedule at [phone]

## 2018-11-01 ENCOUNTER — Telehealth: Payer: Self-pay | Admitting: *Deleted

## 2018-11-01 ENCOUNTER — Other Ambulatory Visit: Payer: Self-pay

## 2018-11-01 DIAGNOSIS — C19 Malignant neoplasm of rectosigmoid junction: Secondary | ICD-10-CM

## 2018-11-01 DIAGNOSIS — R1084 Generalized abdominal pain: Secondary | ICD-10-CM

## 2018-11-01 NOTE — Telephone Encounter (Signed)
William Mahoney, pt still has not heard anything about scan being moved up he is in pain and would like sooner.

## 2018-11-07 ENCOUNTER — Encounter: Payer: Self-pay | Admitting: Family Medicine

## 2018-11-09 ENCOUNTER — Ambulatory Visit (HOSPITAL_COMMUNITY): Payer: Medicare Other

## 2018-11-10 ENCOUNTER — Telehealth: Payer: Self-pay | Admitting: *Deleted

## 2018-11-10 NOTE — Telephone Encounter (Signed)

## 2018-11-10 NOTE — Progress Notes (Signed)
Called pt and completed PHQ-2.

## 2018-11-11 ENCOUNTER — Other Ambulatory Visit: Payer: Self-pay

## 2018-11-11 ENCOUNTER — Ambulatory Visit (INDEPENDENT_AMBULATORY_CARE_PROVIDER_SITE_OTHER)
Admission: RE | Admit: 2018-11-11 | Discharge: 2018-11-11 | Disposition: A | Discharge status: Home / Self Care | Payer: Medicare Other | Source: Ambulatory Visit | Attending: Family Medicine | Admitting: Family Medicine

## 2018-11-11 ENCOUNTER — Other Ambulatory Visit: Payer: Medicare Other

## 2018-11-11 DIAGNOSIS — C19 Malignant neoplasm of rectosigmoid junction: Secondary | ICD-10-CM | POA: Diagnosis not present

## 2018-11-11 DIAGNOSIS — R1084 Generalized abdominal pain: Secondary | ICD-10-CM

## 2018-11-11 MED ORDER — IOHEXOL 300 MG/ML  SOLN
100.0000 mL | Freq: Once | INTRAMUSCULAR | Status: AC | PRN
  Administered 2018-11-11: 100 mL via INTRAVENOUS

## 2018-11-14 ENCOUNTER — Other Ambulatory Visit: Payer: Self-pay

## 2018-11-14 DIAGNOSIS — I719 Aortic aneurysm of unspecified site, without rupture: Secondary | ICD-10-CM

## 2018-11-14 NOTE — Progress Notes (Signed)
Spoke with patient in regards to result notes and also informed pt a referral was sent to vascular surgery.

## 2018-11-17 ENCOUNTER — Telehealth (HOSPITAL_COMMUNITY): Payer: Self-pay | Admitting: Rehabilitation

## 2018-11-17 NOTE — Telephone Encounter (Signed)

## 2018-11-18 ENCOUNTER — Other Ambulatory Visit: Payer: Self-pay

## 2018-11-18 ENCOUNTER — Ambulatory Visit (INDEPENDENT_AMBULATORY_CARE_PROVIDER_SITE_OTHER): Payer: Medicare Other | Admitting: Vascular Surgery

## 2018-11-18 ENCOUNTER — Encounter: Payer: Self-pay | Admitting: Vascular Surgery

## 2018-11-18 VITALS — BP 147/85 | HR 53 | Temp 97.5°F | Resp 20 | Ht 71.0 in | Wt 163.8 lb

## 2018-11-18 DIAGNOSIS — I714 Abdominal aortic aneurysm, without rupture, unspecified: Secondary | ICD-10-CM

## 2018-11-18 NOTE — Progress Notes (Signed)
Patient ID: William Mahoney, male   DOB: 1925-12-24, 85 y.o.   MRN: [id number]  Reason for Consult: New Patient (Initial Visit)   Referred by Marin Olp, MD  Subjective:     HPI:  William Mahoney is a 85 y.o. male presents for evaluation abdominal aortic aneurysm.  Is never known of aneurysm before.  Does not of any family history of aneurysm or personal history of aneurysm.  Was a former smoker.  Underwent CT scan for rectal cancer follow-up was found to have the aneurysm.  No new back or abdominal pain.  Did have some abdominal wall pain at the time of the CT scan but this is subsequently resolved.  He remains active does not do any heavy lifting.  Past Medical History:  Diagnosis Date  . BPH (benign prostatic hypertrophy)   . Chronic atrial fibrillation    cardiologist--   dr berry  . COPD (chronic obstructive pulmonary disease) ([place])   . Diverticulosis of colon    MODERATE LEFT SIDE  . Epididymal cyst    w/ epididymalitis  . Full dentures   . 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 Hearing loss   . History of alcohol abuse    quit drinking in the mid 80's  . History of cirrhosis of liver    alcoholic--  hx alcohol abuse -- quit drinking 1980's  . History of gout   . History of melanoma excision    2013--  left ear lobe and back of hand  . History of pancreatitis    2008  . History of rectal cancer oncologist-  dr Benay Spice--  no recurrence   dx Sept 2009--  Stage II (T3N0)  s/p  sigmoid colectomy & low anterior resection 04-18-2008  and chemoradiation 2010  . History of shingles 07/27/2010  . History of squamous cell carcinoma excision    left lower leg  . Hyperlipidemia   . Hypertension   . Loose stools    DUE TO ANTIBIOTICS  . Macular degeneration    not sure which eye  . OA (osteoarthritis)   . RBBB (right bundle branch block)   . Renal lesion    chronic-- left side  . Sciatica of right side   . Urge urinary  incontinence    intermittant   Family History  Problem Relation Age of Onset  . Hypertension Mother   . Lymphoma Father   . Lymphoma Other   . Stroke Other        1st degree relative  . Colon cancer Neg Hx   . Esophageal cancer Neg Hx   . Rectal cancer Neg Hx   . Stomach cancer Neg Hx    Past Surgical History:  Procedure Laterality Date  . CARDIAC CATHETERIZATION  2001  approx.  in [place]  . CATARACT EXTRACTION W/ INTRAOCULAR LENS  IMPLANT, BILATERAL    . COLONOSCOPY  last one 06-27-2012  . EXPLORATORY LAPAROTOMY/  LOW ANTERIOR RESECTION/  SIGMOID COLECTOMY  04-18-2008   DR Dalbert Batman  . INCISION AND DRAINAGE ABSCESS N/A 11/02/2014   Procedure: INCISION AND DRAINAGE OF SCROTUM;  Surgeon: Ardis Hughs, MD;  Location: [facility];  Service: Urology;  Laterality: N/A;  . INGUINAL HERNIA REPAIR Right 03/23/2014   Procedure: HERNIA REPAIR INGUINAL ADULT OPEN REPAIR RIGHT INGUINAL HERNIA REPAIR;  Surgeon: Fanny Skates, MD;  Location: [facility];  Service: General;  Laterality: Right;  . INSERTION OF MESH  Right 03/23/2014   Procedure: INSERTION OF MESH;  Surgeon: Fanny Skates, MD;  Location: [place];  Service: General;  Laterality: Right;  . LAPAROSCOPIC CHOLECYSTECTOMY  1990's  . MASS EXCISION N/A 11/02/2014   Procedure: EXCISION OF EPIDIDYMAL CYST;  Surgeon: Ardis Hughs, MD;  Location: [facility];  Service: Urology;  Laterality: N/A;  . TONSILLECTOMY  as child  . TRANSTHORACIC ECHOCARDIOGRAM  05-11-2008   pseudonormal LV filling pattern,  ef 60-65%/  mild to moderate MV calcification no stenosis w/ mild to moderate regurg./  mild LAE and RAE/  mild TR    Short Social History:  Social History   Tobacco Use  . Smoking status: Former Smoker    Packs/day: 1.00    Years: 55.00    Pack years: 55.00    Types: Cigarettes    Quit date: 03/20/2008    Years since quitting: 10.6  . Smokeless tobacco: Never Used  Substance Use Topics  . Alcohol use: No     Comment: hx alcohol abuse -- quit in 1980's     Allergies  Allergen Reactions  . Flexeril [Cyclobenzaprine Hcl] Other (See Comments)       caused altered mental status    Current Outpatient Medications  Medication Sig Dispense Refill  . allopurinol (ZYLOPRIM) 300 MG tablet TAKE ONE-HALF TABLET BY MOUTH IN THE EVENING 45 tablet 1  . amLODipine (NORVASC) 2.5 MG tablet Take 1 tablet (2.5 mg total) by mouth daily. 90 tablet 1  . baclofen (LIORESAL) 10 MG tablet Take 1 tablet (10 mg total) by mouth 3 (three) times daily. 90 each 0  . colchicine 0.6 MG tablet Take 0.6 mg by mouth 2 (two) times daily as needed (gout).    . fish oil-omega-3 fatty acids 1000 MG capsule Take 1 g by mouth daily.    . fluticasone (FLONASE) 50 MCG/ACT nasal spray Place 2 sprays into both nostrils daily. 16 g 6  . furosemide (LASIX) 20 MG tablet TAKE 1 TABLET BY MOUTH EVERY DAY AS NEEDED 30 tablet 0  . metoprolol tartrate (LOPRESSOR) 25 MG tablet TAKE 1/2 TABLET BY MOUTH 2 TIMES DAILY 90 tablet 3  . Multiple Vitamin (MULTIVITAMIN) tablet Take 1 tablet by mouth daily.    . prednisoLONE acetate (PRED FORTE) 1 % ophthalmic suspension PUT 1 DROP INTO LEFT EYE 4 TIMES A DAY FOR 1 WEEK    . traMADol (ULTRAM) 50 MG tablet Take 1 tablet (50 mg total) by mouth every 8 (eight) hours as needed for moderate pain or severe pain (dont drive for 8 hours after atking). 20 tablet 0  . triamterene-hydrochlorothiazide (MAXZIDE-25) 37.5-25 MG tablet Take 0.5 tablets by mouth daily. 45 tablet 3  . warfarin (COUMADIN) 2.5 MG tablet TAKE AS DIRECTED BY CLINIC 40 tablet 1  . zolpidem (AMBIEN) 5 MG tablet TAKE 1/2 (ONE-HALF) TABLET BY MOUTH AT BEDTIME AS NEEDED FOR SLEEP 15 tablet 5   No current facility-administered medications for this visit.     Review of Systems  Constitutional:  Constitutional negative. HENT: HENT negative.  Eyes: Eyes negative.  Respiratory: Respiratory negative.  Cardiovascular: Positive for leg swelling.  GI:  Gastrointestinal negative.  Musculoskeletal: Positive for joint pain.  Skin: Skin negative.  Neurological: Neurological negative. Hematologic: Hematologic/lymphatic negative.  Psychiatric: Psychiatric negative.        Objective:  Objective   Vitals:   11/18/18 1508  BP: (!) 147/85  Pulse: (!) 53  Resp: 20  Temp: (!) 97.5 F (36.4 C)  TempSrc: Temporal  SpO2: 97%  Weight: 163 lb 12.8 oz (74.3 kg)  Height: 5\' 11"  (1.803 m)   Body mass index is 22.85 kg/m.  Physical Exam HENT:     Head: Normocephalic.     Nose: Nose normal.     Mouth/Throat:     Mouth: Mucous membranes are moist.  Eyes:     Pupils: Pupils are equal, round, and reactive to light.  Neck:     Musculoskeletal: Neck supple.     Vascular: No carotid bruit.  Cardiovascular:     Rate and Rhythm: Normal rate.     Pulses:          Radial pulses are 2+ on the right side and 2+ on the left side.       Femoral pulses are 2+ on the right side and 2+ on the left side.      Popliteal pulses are 0 on the right side and 2+ on the left side.  Pulmonary:     Effort: Pulmonary effort is normal.     Breath sounds: Normal breath sounds.  Abdominal:     Palpations: Abdomen is soft. There is no mass.  Musculoskeletal:     Right lower leg: Edema present.     Left lower leg: No edema.  Skin:    General: Skin is warm and dry.     Capillary Refill: Capillary refill takes less than 2 seconds.  Neurological:     General: No focal deficit present.     Mental Status: He is alert.  Psychiatric:        Mood and Affect: Mood normal.        Behavior: Behavior normal.        Thought Content: Thought content normal.        Judgment: Judgment normal.     Data: I reviewed the CT scan with the patient and his wife which demonstrates a multilobed para visceral and largest diameter infrarenal aneurysm at 4.3 cm.  Compared to 2009 this was 3.4 cm at the time.     Assessment/Plan:    85 year old male with 4.3 cm infrarenal  aneurysm component also has a para visceral component which is just about 3 cm in diameter.  Compared to 2009 largest diameter was about 3.4 cm.  With this appears to be slow-growing but I discussed with him that growth can really be unpredictable as it gets larger we will need to watch this.  We will plan for duplex in 1 year.  I discussed with him signs and symptoms of rupture as well as plan for repair if it ever reaches 5.5 cm grows greater than a centimeter in 1 year.  If it is significantly larger in 1 year would plan for CT scan for further evaluation.  I discussed this with patient and his wife and daughter via telephone they all demonstrate good understanding.      Waynetta Sandy MD [facility]

## 2018-12-03 ENCOUNTER — Other Ambulatory Visit: Payer: Self-pay | Admitting: Family Medicine

## 2018-12-05 ENCOUNTER — Other Ambulatory Visit: Payer: Self-pay | Admitting: Family Medicine

## 2018-12-05 DIAGNOSIS — Z5181 Encounter for therapeutic drug level monitoring: Secondary | ICD-10-CM

## 2018-12-05 NOTE — Telephone Encounter (Signed)
Okay for refill?  

## 2018-12-06 NOTE — Telephone Encounter (Signed)
Last OV 10/28/2018 Last refill 07/12/18 #20/0 Next OV not scheduled  Forwarding to Dr. Yong Channel

## 2018-12-07 ENCOUNTER — Other Ambulatory Visit: Payer: Self-pay

## 2018-12-07 ENCOUNTER — Ambulatory Visit (INDEPENDENT_AMBULATORY_CARE_PROVIDER_SITE_OTHER): Payer: Medicare Other | Admitting: General Practice

## 2018-12-07 DIAGNOSIS — Z5181 Encounter for therapeutic drug level monitoring: Secondary | ICD-10-CM

## 2018-12-07 DIAGNOSIS — I4891 Unspecified atrial fibrillation: Secondary | ICD-10-CM

## 2018-12-07 LAB — POCT INR: INR: 2.6 (ref 2.0–3.0)

## 2018-12-07 NOTE — Patient Instructions (Addendum)
Pre visit review using our clinic review tool, if applicable. No additional management support is needed unless otherwise documented below in the visit note.  Continue to take 1 tablet daily except 1 1/2 tablets on Wednesday and Saturdays.  RE-check in 6 weeks.  Dosing info given to patient and he did verbalize understanding.

## 2018-12-07 NOTE — Progress Notes (Signed)
I have reviewed and agree with note, evaluation, plan.   Wylie Russon, MD  

## 2018-12-09 ENCOUNTER — Other Ambulatory Visit: Payer: Self-pay

## 2018-12-09 ENCOUNTER — Ambulatory Visit (INDEPENDENT_AMBULATORY_CARE_PROVIDER_SITE_OTHER): Payer: Medicare Other

## 2018-12-09 DIAGNOSIS — Z0000 Encounter for general adult medical examination without abnormal findings: Secondary | ICD-10-CM

## 2018-12-09 NOTE — Patient Instructions (Addendum)
William Mahoney , Thank you for taking time to come for your Medicare Wellness Visit. I appreciate your ongoing commitment to your health goals. Please review the following plan we discussed and let me know if I can assist you in the future.   Screening recommendations/referrals: Colorectal Screening: up to date; last 06/27/12  Vision and Dental Exams: Recommended annual ophthalmology exams for early detection of glaucoma and other disorders of the eye Recommended annual dental exams for proper oral hygiene  Vaccinations: Influenza vaccine:  recommended this fall either at PCP office or through your local pharmacy  Pneumococcal vaccine: completed; 07/10/14 Tdap vaccine: up to date; last 01/09/18  Shingles vaccine: Please call your insurance company to determine your out of pocket expense for the Shingrix vaccine. You may receive this vaccine at your local pharmacy.  Advanced directives: Please bring a copy of your POA (Power of Attorney) and/or Living Will to your next appointment.  Goals: Recommend to remove any items from the home that may cause slips or trips.  Next appointment: Please schedule your Annual Wellness Visit with your Nurse Health Advisor in one year.  Preventive Care 75 Years and Older, Male Preventive care refers to lifestyle choices and visits with your health care provider that can promote health and wellness. What does preventive care include?  A yearly physical exam. This is also called an annual well check.  Dental exams once or twice a year.  Routine eye exams. Ask your health care provider how often you should have your eyes checked.  Personal lifestyle choices, including:  Daily care of your teeth and gums.  Regular physical activity.  Eating a healthy diet.  Avoiding tobacco and drug use.  Limiting alcohol use.  Practicing safe sex.  Taking low doses of aspirin every day if recommended by your health care provider..  Taking vitamin and mineral  supplements as recommended by your health care provider. What happens during an annual well check? The services and screenings done by your health care provider during your annual well check will depend on your age, overall health, lifestyle risk factors, and family history of disease. Counseling  Your health care provider may ask you questions about your:  Alcohol use.  Tobacco use.  Drug use.  Emotional well-being.  Home and relationship well-being.  Sexual activity.  Eating habits.  History of falls.  Memory and ability to understand (cognition).  Work and work Statistician. Screening  You may have the following tests or measurements:  Height, weight, and BMI.  Blood pressure.  Lipid and cholesterol levels. These may be checked every 5 years, or more frequently if you are over 55 years old.  Skin check.  Lung cancer screening. You may have this screening every year starting at age 61 if you have a 30-pack-year history of smoking and currently smoke or have quit within the past 15 years.  Fecal occult blood test (FOBT) of the stool. You may have this test every year starting at age 37.  Flexible sigmoidoscopy or colonoscopy. You may have a sigmoidoscopy every 5 years or a colonoscopy every 10 years starting at age 16.  Prostate cancer screening. Recommendations will vary depending on your family history and other risks.  Hepatitis C blood test.  Hepatitis B blood test.  Sexually transmitted disease (STD) testing.  Diabetes screening. This is done by checking your blood sugar (glucose) after you have not eaten for a while (fasting). You may have this done every 1-3 years.  Abdominal aortic aneurysm (AAA)  screening. You may need this if you are a current or former smoker.  Osteoporosis. You may be screened starting at age 55 if you are at high risk. Talk with your health care provider about your test results, treatment options, and if necessary, the need for more  tests. Vaccines  Your health care provider may recommend certain vaccines, such as:  Influenza vaccine. This is recommended every year.  Tetanus, diphtheria, and acellular pertussis (Tdap, Td) vaccine. You may need a Td booster every 10 years.  Zoster vaccine. You may need this after age 29.  Pneumococcal 13-valent conjugate (PCV13) vaccine. One dose is recommended after age 69.  Pneumococcal polysaccharide (PPSV23) vaccine. One dose is recommended after age 51. Talk to your health care provider about which screenings and vaccines you need and how often you need them. This information is not intended to replace advice given to you by your health care provider. Make sure you discuss any questions you have with your health care provider. Document Released: 05/03/2015 Document Revised: 12/25/2015 Document Reviewed: 02/05/2015 Elsevier Interactive Patient Education  2017 [place] Prevention in the Home Falls can cause injuries. They can happen to people of all ages. There are many things you can do to make your home safe and to help prevent falls. What can I do on the outside of my home?  Regularly fix the edges of walkways and driveways and fix any cracks.  Remove anything that might make you trip as you walk through a door, such as a raised step or threshold.  Trim any bushes or trees on the path to your home.  Use bright outdoor lighting.  Clear any walking paths of anything that might make someone trip, such as rocks or tools.  Regularly check to see if handrails are loose or broken. Make sure that both sides of any steps have handrails.  Any raised decks and porches should have guardrails on the edges.  Have any leaves, snow, or ice cleared regularly.  Use sand or salt on walking paths during winter.  Clean up any spills in your garage right away. This includes oil or grease spills. What can I do in the bathroom?  Use night lights.  Install grab bars by the  toilet and in the tub and shower. Do not use towel bars as grab bars.  Use non-skid mats or decals in the tub or shower.  If you need to sit down in the shower, use a plastic, non-slip stool.  Keep the floor dry. Clean up any water that spills on the floor as soon as it happens.  Remove soap buildup in the tub or shower regularly.  Attach bath mats securely with double-sided non-slip rug tape.  Do not have throw rugs and other things on the floor that can make you trip. What can I do in the bedroom?  Use night lights.  Make sure that you have a light by your bed that is easy to reach.  Do not use any sheets or blankets that are too big for your bed. They should not hang down onto the floor.  Have a firm chair that has side arms. You can use this for support while you get dressed.  Do not have throw rugs and other things on the floor that can make you trip. What can I do in the kitchen?  Clean up any spills right away.  Avoid walking on wet floors.  Keep items that you use a lot in easy-to-reach  places.  If you need to reach something above you, use a strong step stool that has a grab bar.  Keep electrical cords out of the way.  Do not use floor polish or wax that makes floors slippery. If you must use wax, use non-skid floor wax.  Do not have throw rugs and other things on the floor that can make you trip. What can I do with my stairs?  Do not leave any items on the stairs.  Make sure that there are handrails on both sides of the stairs and use them. Fix handrails that are broken or loose. Make sure that handrails are as long as the stairways.  Check any carpeting to make sure that it is firmly attached to the stairs. Fix any carpet that is loose or worn.  Avoid having throw rugs at the top or bottom of the stairs. If you do have throw rugs, attach them to the floor with carpet tape.  Make sure that you have a light switch at the top of the stairs and the bottom of  the stairs. If you do not have them, ask someone to add them for you. What else can I do to help prevent falls?  Wear shoes that:  Do not have high heels.  Have rubber bottoms.  Are comfortable and fit you well.  Are closed at the toe. Do not wear sandals.  If you use a stepladder:  Make sure that it is fully opened. Do not climb a closed stepladder.  Make sure that both sides of the stepladder are locked into place.  Ask someone to hold it for you, if possible.  Clearly mark and make sure that you can see:  Any grab bars or handrails.  First and last steps.  Where the edge of each step is.  Use tools that help you move around (mobility aids) if they are needed. These include:  Canes.  Walkers.  Scooters.  Crutches.  Turn on the lights when you go into a dark area. Replace any light bulbs as soon as they burn out.  Set up your furniture so you have a clear path. Avoid moving your furniture around.  If any of your floors are uneven, fix them.  If there are any pets around you, be aware of where they are.  Review your medicines with your doctor. Some medicines can make you feel dizzy. This can increase your chance of falling. Ask your doctor what other things that you can do to help prevent falls. This information is not intended to replace advice given to you by your health care provider. Make sure you discuss any questions you have with your health care provider. Document Released: 01/31/2009 Document Revised: 09/12/2015 Document Reviewed: 05/11/2014 Elsevier Interactive Patient Education  2017 [employer]  

## 2018-12-09 NOTE — Progress Notes (Signed)
This visit is being conducted via phone call due to the COVID-19 pandemic. This patient has given me verbal consent via phone to conduct this visit, patient states they are participating from their home address. Some vital signs may be absent or patient reported.   Patient identification: identified by name, DOB, and current address.   Subjective:   William Mahoney is a 85 y.o. male who presents for Medicare Annual/Subsequent preventive examination.  Review of Systems:   Cardiac Risk Factors include: advanced age (>30men, >49 women);hypertension;male gender     Objective:    Vitals: There were no vitals taken for this visit.  There is no height or weight on file to calculate BMI.  Advanced Directives 12/09/2018 11/18/2018 01/09/2018 06/17/2017 11/02/2014 03/16/2014  Does Patient Have a Medical Advance Directive? No No;Yes No No No No  Type of Advance Directive - Healthcare Power of [place];Living will - - - -  Does patient want to make changes to medical advance directive? - No - Patient declined - - - -  Copy of [place] in Chart? - No - copy requested - - - -  Would patient like information on creating a medical advance directive? No - Patient declined - No - Patient declined - Yes - Scientist, clinical (histocompatibility and immunogenetics) given No - patient declined information    Tobacco Social History   Tobacco Use  Smoking Status Former Smoker  . Packs/day: 1.00  . Years: 55.00  . Pack years: 55.00  . Types: Cigarettes  . Quit date: 03/20/2008  . Years since quitting: 10.7  Smokeless Tobacco Never Used     Clinical Intake:  Pre-visit preparation completed: Yes  How often do you need to have someone help you when you read instructions, pamphlets, or other written materials from your doctor or pharmacy?: 2 - Rarely  Interpreter Needed?: No  Information entered by :: Denman George LPN  Past Medical History:  Diagnosis Date  . BPH (benign prostatic hypertrophy)   . Chronic  atrial fibrillation    cardiologist--   dr berry  . COPD (chronic obstructive pulmonary disease) ([place])   . Diverticulosis of colon    MODERATE LEFT SIDE  . Epididymal cyst    w/ epididymalitis  . Full dentures   . 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 Hearing loss   . History of alcohol abuse    quit drinking in the mid 80's  . History of cirrhosis of liver    alcoholic--  hx alcohol abuse -- quit drinking 1980's  . History of gout   . History of melanoma excision    2013--  left ear lobe and back of hand  . History of pancreatitis    2008  . History of rectal cancer oncologist-  dr Benay Spice--  no recurrence   dx Sept 2009--  Stage II (T3N0)  s/p  sigmoid colectomy & low anterior resection 04-18-2008  and chemoradiation 2010  . History of shingles 07/27/2010  . History of squamous cell carcinoma excision    left lower leg  . Hyperlipidemia   . Hypertension   . Loose stools    DUE TO ANTIBIOTICS  . Macular degeneration    not sure which eye  . OA (osteoarthritis)   . RBBB (right bundle branch block)   . Renal lesion    chronic-- left side  . Sciatica of right side   . Urge urinary incontinence  intermittant   Past Surgical History:  Procedure Laterality Date  . CARDIAC CATHETERIZATION  2001  approx.  in [place]  . CATARACT EXTRACTION W/ INTRAOCULAR LENS  IMPLANT, BILATERAL    . COLONOSCOPY  last one 06-27-2012  . EXPLORATORY LAPAROTOMY/  LOW ANTERIOR RESECTION/  SIGMOID COLECTOMY  04-18-2008   DR Dalbert Batman  . INCISION AND DRAINAGE ABSCESS N/A 11/02/2014   Procedure: INCISION AND DRAINAGE OF SCROTUM;  Surgeon: Ardis Hughs, MD;  Location: [facility];  Service: Urology;  Laterality: N/A;  . INGUINAL HERNIA REPAIR Right 03/23/2014   Procedure: HERNIA REPAIR INGUINAL ADULT OPEN REPAIR RIGHT INGUINAL HERNIA REPAIR;  Surgeon: Fanny Skates, MD;  Location: [place];  Service: General;  Laterality: Right;  . INSERTION OF  MESH Right 03/23/2014   Procedure: INSERTION OF MESH;  Surgeon: Fanny Skates, MD;  Location: [facility];  Service: General;  Laterality: Right;  . LAPAROSCOPIC CHOLECYSTECTOMY  1990's  . MASS EXCISION N/A 11/02/2014   Procedure: EXCISION OF EPIDIDYMAL CYST;  Surgeon: Ardis Hughs, MD;  Location: [facility];  Service: Urology;  Laterality: N/A;  . TONSILLECTOMY  as child  . TRANSTHORACIC ECHOCARDIOGRAM  05-11-2008   pseudonormal LV filling pattern,  ef 60-65%/  mild to moderate MV calcification no stenosis w/ mild to moderate regurg./  mild LAE and RAE/  mild TR   Family History  Problem Relation Age of Onset  . Hypertension Mother   . Lymphoma Father   . Lymphoma Other   . Stroke Other        1st degree relative  . Colon cancer Neg Hx   . Esophageal cancer Neg Hx   . Rectal cancer Neg Hx   . Stomach cancer Neg Hx    Social History   Socioeconomic History  . Marital status: Married    Spouse name: Not on file  . Number of children: 6  . Years of education: Not on file  . Highest education level: Not on file  Occupational History  . Occupation: Retired    Fish farm manager: RETIRED  Social Needs  . Financial resource strain: Not on file  . Food insecurity    Worry: Not on file    Inability: Not on file  . Transportation needs    Medical: Not on file    Non-medical: Not on file  Tobacco Use  . Smoking status: Former Smoker    Packs/day: 1.00    Years: 55.00    Pack years: 55.00    Types: Cigarettes    Quit date: 03/20/2008    Years since quitting: 10.7  . Smokeless tobacco: Never Used  Substance and Sexual Activity  . Alcohol use: No    Comment: hx alcohol abuse -- quit in 1980's   . Drug use: No  . Sexual activity: Not on file  Lifestyle  . Physical activity    Days per week: Not on file    Minutes per session: Not on file  . Stress: Not on file  Relationships  . Social Herbalist on phone: Not on file    Gets together: Not on file     Attends religious service: Not on file    Active member of club or organization: Not on file    Attends meetings of clubs or organizations: Not on file    Relationship status: Not on file  Other Topics Concern  . Not on file  Social History Narrative   Married. 6 kids. Wife  Joycelyn Schmid also a patient of Dr. Ronney Lion.       Retired.     Outpatient Encounter Medications as of 12/09/2018  Medication Sig  . allopurinol (ZYLOPRIM) 300 MG tablet TAKE ONE-HALF TABLET BY MOUTH IN THE EVENING  . amLODipine (NORVASC) 2.5 MG tablet Take 1 tablet (2.5 mg total) by mouth daily.  . baclofen (LIORESAL) 10 MG tablet Take 1 tablet (10 mg total) by mouth 3 (three) times daily.  . colchicine 0.6 MG tablet Take 0.6 mg by mouth 2 (two) times daily as needed (gout).  . fish oil-omega-3 fatty acids 1000 MG capsule Take 1 g by mouth daily.  . fluticasone (FLONASE) 50 MCG/ACT nasal spray Place 2 sprays into both nostrils daily.  . furosemide (LASIX) 20 MG tablet TAKE 1 TABLET BY MOUTH EVERY DAY AS NEEDED  . metoprolol tartrate (LOPRESSOR) 25 MG tablet TAKE 1/2 TABLET BY MOUTH 2 TIMES DAILY  . Multiple Vitamin (MULTIVITAMIN) tablet Take 1 tablet by mouth daily.  . prednisoLONE acetate (PRED FORTE) 1 % ophthalmic suspension PUT 1 DROP INTO LEFT EYE 4 TIMES A DAY FOR 1 WEEK  . traMADol (ULTRAM) 50 MG tablet TAKE 1 TABLET (50 MG TOTAL) BY MOUTH EVERY 8 (EIGHT) HOURS AS NEEDED FOR MODERATE PAIN OR SEVERE PAIN (DONT DRIVE FOR 8 HOURS AFTER ATKING).  [name] triamterene-hydrochlorothiazide (MAXZIDE-25) 37.5-25 MG tablet Take 0.5 tablets by mouth daily.  [name] warfarin (COUMADIN) 2.5 MG tablet Take 1 tablet daily except take 1 1/2 tablets on Wed and Sat or TAKE AS DIRECTED BY CLINIC  . zolpidem (AMBIEN) 5 MG tablet TAKE 1/2 (ONE-HALF) TABLET BY MOUTH AT BEDTIME AS NEEDED FOR SLEEP   No facility-administered encounter medications on file as of 12/09/2018.     Activities of Daily Living In your present state of health, do you have  any difficulty performing the following activities: 12/09/2018  Hearing? N  Vision? N  Difficulty concentrating or making decisions? N  Walking or climbing stairs? N  Dressing or bathing? N  Doing errands, shopping? N  Preparing Food and eating ? N  Using the Toilet? N  In the past six months, have you accidently leaked urine? N  Do you have problems with loss of bowel control? N  Managing your Medications? N  Managing your Finances? N  Housekeeping or managing your Housekeeping? N  Comment assisted by daughter  Some recent data might be hidden    Patient Care Team: Marin Olp, MD as PCP - General (Family Medicine) Waynetta Sandy, MD as Consulting Physician (Vascular Surgery) Hortencia Pilar, MD as Consulting Physician (Ophthalmology) Dermatology, [facility] as Consulting Physician   Assessment:   This is a routine wellness examination for R[place].  Exercise Activities and Dietary recommendations Current Exercise Habits: The patient does not participate in regular exercise at present  Goals    . Patient Stated     Will try pool exercise or get out with wife Try the y nearby       [place]  12/09/2018 06/17/2017 05/21/2017 03/23/2016 07/10/2014  Falls in the past year? 0 No No No No  Number falls in past yr: 0 - - - -  Injury with Fall? 0 - - - -  Follow up Education provided - - - -    Depression Screen PHQ 2/9 Scores 12/09/2018 11/10/2018 10/28/2018 10/20/2017  PHQ - 2 Score 0 0 1 0  PHQ- 9 Score - - 5 4    Cognitive Function MMSE -  Mini Mental State Exam 06/17/2017  Not completed: (No Data)   No cognitive concerns at this time; unable to complete assessment due to nature of visit     Immunization History  Administered Date(s) Administered  . Influenza Split 01/14/2011  . Influenza, High Dose Seasonal PF 01/20/2013, 05/21/2017, 03/03/2018  . Influenza,inj,Quad PF,6+ Mos 01/09/2014  . Pneumococcal Conjugate-13 07/10/2014  .  Pneumococcal Polysaccharide-23 04/20/2006  . Td 11/10/2012  . Tdap 01/09/2018    Qualifies for Shingles Vaccine? Discussed and patient will check with pharmacy for coverage.  Patient education handout provided    Screening Tests Health Maintenance  Topic Date Due  . INFLUENZA VACCINE  11/19/2018  . TETANUS/TDAP  01/10/2028  . PNA vac Low Risk Adult  Completed   Cancer Screenings: Colorectal: not indicated; last colonoscopy 06/27/12     Plan:    I have personally reviewed and addressed the Medicare Annual Wellness questionnaire and have noted the following in the patient's chart:  A. Medical and social history B. Use of alcohol, tobacco or illicit drugs  C. Current medications and supplements D. Functional ability and status E.  Nutritional status F.  Physical activity G. Advance directives H. List of other physicians I.  Hospitalizations, surgeries, and ER visits in previous 12 months J.  [place] such as hearing and vision if needed, cognitive and depression L. Referrals, records requested, and appointments- follow up with pcp to be scheduled   In addition, I have reviewed and discussed with patient certain preventive protocols, quality metrics, and best practice recommendations. A written personalized care plan for preventive services as well as general preventive health recommendations were provided to patient.   Signed,  Denman George, LPN  Nurse Health Advisor   Nurse Notes: Patient with ongoing problems with TMJ.  Needs follow up visit to access chronic conditions and routine labs.

## 2018-12-09 NOTE — Progress Notes (Signed)
I have personally reviewed the Medicare Annual Wellness Visit and agree with the assessment and plan.  Algis Greenhouse. Jerline Pain, MD 12/09/2018 12:00 PM

## 2018-12-13 NOTE — Progress Notes (Signed)
Nurse Notes: Patient with ongoing problems with TMJ.  Needs follow up visit to access chronic conditions and routine labs.   Please contact pt to schedule. Thanks!

## 2018-12-21 ENCOUNTER — Other Ambulatory Visit: Payer: Self-pay

## 2018-12-21 MED ORDER — AMLODIPINE BESYLATE 2.5 MG PO TABS: 2.5000 mg | ORAL_TABLET | Freq: Every day | ORAL | 1 refills | Status: DC

## 2018-12-21 MED ORDER — METOPROLOL TARTRATE 25 MG PO TABS: ORAL_TABLET | ORAL | 3 refills | Status: DC

## 2018-12-21 MED ORDER — TRIAMTERENE-HCTZ 37.5-25 MG PO TABS: 0.5000 | ORAL_TABLET | Freq: Every day | ORAL | 3 refills | Status: DC

## 2018-12-21 MED ORDER — ALLOPURINOL 300 MG PO TABS: ORAL_TABLET | ORAL | 1 refills | Status: DC

## 2018-12-21 NOTE — Telephone Encounter (Signed)
Rx refilled electronically °

## 2018-12-22 ENCOUNTER — Other Ambulatory Visit: Payer: Self-pay | Admitting: Family Medicine

## 2018-12-30 ENCOUNTER — Ambulatory Visit: Payer: Medicare Other | Admitting: Cardiovascular Disease

## 2019-01-01 ENCOUNTER — Other Ambulatory Visit: Payer: Self-pay | Admitting: Family Medicine

## 2019-01-01 DIAGNOSIS — Z5181 Encounter for therapeutic drug level monitoring: Secondary | ICD-10-CM

## 2019-01-02 ENCOUNTER — Other Ambulatory Visit: Payer: Self-pay | Admitting: Family Medicine

## 2019-01-05 ENCOUNTER — Encounter: Payer: Self-pay | Admitting: Family Medicine

## 2019-01-05 ENCOUNTER — Other Ambulatory Visit: Payer: Self-pay

## 2019-01-05 ENCOUNTER — Ambulatory Visit: Payer: Medicare Other | Admitting: Family Medicine

## 2019-01-05 VITALS — BP 130/80 | HR 48 | Temp 98.7°F | Ht 71.0 in | Wt 166.0 lb

## 2019-01-05 DIAGNOSIS — Z23 Encounter for immunization: Secondary | ICD-10-CM | POA: Diagnosis not present

## 2019-01-05 DIAGNOSIS — E785 Hyperlipidemia, unspecified: Secondary | ICD-10-CM

## 2019-01-05 DIAGNOSIS — I700 Atherosclerosis of aorta: Secondary | ICD-10-CM | POA: Insufficient documentation

## 2019-01-05 DIAGNOSIS — M1A9XX0 Chronic gout, unspecified, without tophus (tophi): Secondary | ICD-10-CM

## 2019-01-05 DIAGNOSIS — I4891 Unspecified atrial fibrillation: Secondary | ICD-10-CM

## 2019-01-05 DIAGNOSIS — Z85048 Personal history of other malignant neoplasm of rectum, rectosigmoid junction, and anus: Secondary | ICD-10-CM

## 2019-01-05 DIAGNOSIS — I714 Abdominal aortic aneurysm, without rupture, unspecified: Secondary | ICD-10-CM

## 2019-01-05 DIAGNOSIS — I10 Essential (primary) hypertension: Secondary | ICD-10-CM | POA: Diagnosis not present

## 2019-01-05 LAB — LIPID PANEL
Cholesterol: 180 mg/dL (ref 0–200)
HDL: 33.7 mg/dL — ABNORMAL LOW (ref >39.00)
LDL Cholesterol: 111 mg/dL — ABNORMAL HIGH (ref 0–99)
NonHDL: 146.6
Total CHOL/HDL Ratio: 5
Triglycerides: 177 mg/dL — ABNORMAL HIGH (ref 0.0–149.0)
VLDL: 35.4 mg/dL (ref 0.0–40.0)

## 2019-01-05 LAB — COMPREHENSIVE METABOLIC PANEL
ALT: 11 U/L (ref 0–53)
AST: 19 U/L (ref 0–37)
Albumin: 4.3 g/dL (ref 3.5–5.2)
Alkaline Phosphatase: 75 U/L (ref 39–117)
BUN: 25 mg/dL — ABNORMAL HIGH (ref 6–23)
CO2: 33 mEq/L — ABNORMAL HIGH (ref 19–32)
Calcium: 9.5 mg/dL (ref 8.4–10.5)
Chloride: 101 mEq/L (ref 96–112)
Creatinine, Ser: 1.2 mg/dL (ref 0.40–1.50)
GFR: 56.72 mL/min — ABNORMAL LOW (ref >60.00)
Glucose, Bld: 86 mg/dL (ref 70–99)
Potassium: 4.5 mEq/L (ref 3.5–5.1)
Sodium: 140 mEq/L (ref 135–145)
Total Bilirubin: 0.7 mg/dL (ref 0.2–1.2)
Total Protein: 6.6 g/dL (ref 6.0–8.3)

## 2019-01-05 LAB — CBC
HCT: 45.2 % (ref 39.0–52.0)
Hemoglobin: 14.9 g/dL (ref 13.0–17.0)
MCHC: 32.9 g/dL (ref 30.0–36.0)
MCV: 93.3 fl (ref 78.0–100.0)
Platelets: 126 10*3/uL — ABNORMAL LOW (ref 150.0–400.0)
RBC: 4.84 Mil/uL (ref 4.22–5.81)
RDW: 15.8 % — ABNORMAL HIGH (ref 11.5–15.5)
WBC: 6 10*3/uL (ref 4.0–10.5)

## 2019-01-05 LAB — URIC ACID: Uric Acid, Serum: 5.4 mg/dL (ref 4.0–7.8)

## 2019-01-05 MED ORDER — ZOLPIDEM TARTRATE 5 MG PO TABS: ORAL_TABLET | ORAL | 5 refills | Status: DC

## 2019-01-05 MED ORDER — TRAMADOL HCL 50 MG PO TABS: 50.0000 mg | ORAL_TABLET | Freq: Three times a day (TID) | ORAL | 5 refills | Status: DC | PRN

## 2019-01-05 NOTE — Addendum Note (Signed)
Addended by: Jasper Loser on: 01/05/2019 12:02 PM   Modules accepted: Orders

## 2019-01-05 NOTE — Assessment & Plan Note (Signed)
Noted on prior imaging- continue blood pressure control.  Do not feel strongly about starting statin and 86 year old for aortic atherosclerosis alone.-We will try to maximize diet

## 2019-01-05 NOTE — Addendum Note (Signed)
Addended by: Marin Olp on: 01/05/2019 11:48 AM   Modules accepted: Orders

## 2019-01-05 NOTE — Patient Instructions (Addendum)
Health Maintenance Due  Topic Date Due  . INFLUENZA VACCINE - today 11/19/2018   Recommend 4 to 60-month physical-can schedule before you leave  could be reflux- advised pepcid or tums in evening. If worsens let us know. Also try to make sure to chew food up really well  Please stop by lab before you go If you do not have mychart- we will call you about results within 5 business days of [place] receiving them.  If you have mychart- we will send your results within 3 business days of [place] receiving them.  If abnormal or we want to clarify a result, we will call or mychart you to make sure you receive the message.  If you have questions or concerns or don't hear within 5-7 days, please send [place] a message or call us.

## 2019-01-05 NOTE — Progress Notes (Addendum)
Phone [phone]   Subjective:  William Mahoney is a 85 y.o. year old very pleasant male patient who presents for/with See problem oriented charting Chief Complaint  Patient presents with  . Follow-up    Not fasting today.   . Temporomandibular Joint Pain  . Hypertension  . Hyperlipidemia  . Atrial Fibrillation  . Gout  . Insomnia   ROS-  Denies HA, dizziness, CP, SOB. Reports some changes with vision, thinks related to Baclofen use- resolves if doesn't take it.  Saw optho- they didn't say avoid baclofen  Past Medical History-  Patient Active Problem List   Diagnosis Date Noted  . Edema 11/10/2016    Priority: High  . Atrial fibrillation ([place]) 12/07/2008    Priority: High  . Insomnia 10/19/2014    Priority: Medium  . Gout 01/09/2014    Priority: Medium  . History of cancer of rectosigmoid junction 01/04/2008    Priority: Medium  . Former smoker 06/23/2007    Priority: Medium  . Hyperlipidemia, unspecified 02/09/2007    Priority: Medium  . Essential hypertension 12/07/2006    Priority: Medium  . COPD 12/07/2006    Priority: Medium  . BPH (benign prostatic hyperplasia) 12/07/2006    Priority: Medium  . Allergic rhinitis 08/15/2013    Priority: Low  . 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 Inguinal hernia 03/21/2010    Priority: Low  . BASAL CELL CARCINOMA SKIN LOWER LIMB INCL HIP 03/21/2010    Priority: Low  . Actinic keratosis 12/07/2008    Priority: Low  . OSTEOARTHRITIS 02/09/2007    Priority: Low  . PEPTIC ULCER DISEASE 12/07/2006    Priority: Low  . URINARY INCONTINENCE 12/07/2006    Priority: Low  . PANCREATITIS, HX OF 12/07/2006    Priority: Low  . Thrombocytopenia ([place]) 11/10/2016  . Encounter for therapeutic drug monitoring 08/27/2014    Medications- reviewed and updated Current Outpatient Medications  Medication Sig Dispense Refill  . allopurinol (ZYLOPRIM) 300 MG tablet TAKE ONE-HALF TABLET BY MOUTH IN THE EVENING 45  tablet 1  . amLODipine (NORVASC) 2.5 MG tablet Take 1 tablet (2.5 mg total) by mouth daily. 90 tablet 1  . colchicine 0.6 MG tablet Take 0.6 mg by mouth 2 (two) times daily as needed (gout).    . fish oil-omega-3 fatty acids 1000 MG capsule Take 1 g by mouth daily.    . fluticasone (FLONASE) 50 MCG/ACT nasal spray Place 2 sprays into both nostrils daily. 16 g 6  . furosemide (LASIX) 20 MG tablet TAKE 1 TABLET BY MOUTH EVERY DAY AS NEEDED 30 tablet 1  . metoprolol tartrate (LOPRESSOR) 25 MG tablet TAKE 1/2 TABLET BY MOUTH TWICE A DAY 30 tablet 0  . Multiple Vitamin (MULTIVITAMIN) tablet Take 1 tablet by mouth daily.    . traMADol (ULTRAM) 50 MG tablet Take 1 tablet (50 mg total) by mouth every 8 (eight) hours as needed for moderate pain or severe pain (dont drive for 8 hours after taking. 1 refill per month). 20 tablet 5  . triamterene-hydrochlorothiazide (MAXZIDE-25) 37.5-25 MG tablet Take 0.5 tablets by mouth daily. 45 tablet 3  . warfarin (COUMADIN) 2.5 MG tablet TAKE 1 TABLET DAILY EXCEPT TAKE 1 1/2 TABLETS ON WED AND SAT OR TAKE AS DIRECTED BY CLINIC 40 tablet 1  . zolpidem (AMBIEN) 5 MG tablet TAKE 1/2 (ONE-HALF) TABLET BY MOUTH AT BEDTIME AS NEEDED FOR SLEEP 15 tablet 5   No current facility-administered medications for this visit.  Objective:  BP 130/80   Pulse (!) 48   Temp 98.7 F (37.1 C)   Ht 5\' 11"  (1.803 m)   Wt 166 lb (75.3 kg)   BMI 23.15 kg/m  Gen: NAD, resting comfortably CV: bradycardic and irregular no murmurs rubs or gallops Lungs: CTAB no crackles, wheeze, rhonchi Abdomen: soft/nontender/nondistended/normal bowel sounds. No rebound or guarding.  Ext: no edema Skin: warm, dry Neuro: grossly normal, moves all extremities     Assessment and Plan   #hypertension S: controlled on Amlodipine 2.5 mg daily, Metoprolol 25 mg BID, Furosemide 20 mg prn, and Triamterene-HCTZ 37.5-25 mg 1/2 tablet daily. Checking BP at home occasionally. BP has been around 140/88. .  Following low sodium diet. Eats fast food and processed food about half the time. No structured exercise regimen. Walks around the house.  BP Readings from Last 3 Encounters:  01/05/19 130/80  11/18/18 (!) 147/85  10/28/18 138/89  A/P: Stable. Continue current medications.   #hyperlipidemia S: Likely poorly controlled on no medication Lab Results  Component Value Date   CHOL 207 (H) 01/06/2011   HDL 36.40 (L) 01/06/2011   LDLDIRECT 149.3 01/06/2011   TRIG 110.0 01/06/2011   CHOLHDL 6 01/06/2011   A/P: Update lipid panel with labs today- unlikely to start statin at age 12 for primary prevention-would only start if needed for secondary prevention  # A Fib S:Taking Warfarin 2.5 mg as directed by Coumadin Clinic.  No rate control medicine due to bradycardia. A/P: Stable. Continue current medications.    # Gout S:Taking Allopurinol 300 mg daily and Colchicine 0.6 mg prn. No recent flare ups.   Lab Results  Component Value Date   LABURIC 5.6 10/20/2017   A/P: Well-controlled-update uric acid with labs  # Insomnia S:Taking Zolpidem 5 mg prn.  Taking 1/2 tablet 2-3 times weekly and/or Tylenol PM. Reports difficulty falling asleep and staying sleep without medication. Reports 6-7 hours of restful sleep nightly.  A/P: Good control-does increase fall risk and if has any future falls we agreed to stop this medication.  # TMJ S:Sx started 03/2018. Sx show no change. Left side. Has tried taking Baclofen, Tramadol, and Tylenol ES with minimal relief unfortunately- starts with tylenol and uses tramadol as back up .  Seems to rotate from lower portion of jaw to higher then back down.  A/P: still with poor control. Has seen dentist- new dentures didn't help- advised to try more regularly  -Willing to refill tramadol or baclofen as needed. Tramadol refill today.  - he may avoid baclofen as per ROS  # AAA S: saw vascular surgery and they recommended 1 year follow up and repeat imaging at that  time  originally worked up due to abdominal pain but fortunately abdominal pain has resolved A/P: suspect remain stable- continue risk factor modification  #usually after evening meal and eating- may get some coughing and feel like things not going well down his throat. Does not use denture and may not be chewing well. Also some phlegm at night -could be reflux- advised pepcid or tums in evening - Also try to make sure to chew food up really well  Recommended follow up: 4 to 13-month physical recommended Future Appointments  Date Time Provider [place]  01/18/2019 11:00 AM [facility]  02/07/2019  9:00 AM Lorretta Harp, MD [facility]    Lab/Order associations:   ICD-10-CM   1. Atrial fibrillation, unspecified type ([place])  I48.91   2. Hyperlipidemia, unspecified  hyperlipidemia type  E78.5 CBC    Comprehensive metabolic panel    Lipid panel  3. Chronic gout without tophus, unspecified cause, unspecified site  [id number] Uric acid  4. Essential hypertension  I10   5. Abdominal aortic aneurysm (AAA) without rupture ([facility])  I71.4   6. History of cancer of rectosigmoid junction  Z85.048 CEA    Meds ordered this encounter  Medications  . traMADol (ULTRAM) 50 MG tablet    Sig: Take 1 tablet (50 mg total) by mouth every 8 (eight) hours as needed for moderate pain or severe pain (dont drive for 8 hours after taking. 1 refill per month).    Dispense:  20 tablet    Refill:  5    Not to exceed 5 additional fills before 01/08/2019.  [name] zolpidem (AMBIEN) 5 MG tablet    Sig: TAKE 1/2 (ONE-HALF) TABLET BY MOUTH AT BEDTIME AS NEEDED FOR SLEEP    Dispense:  15 tablet    Refill:  5    Return precautions advised.  [name], MD

## 2019-01-06 LAB — CEA: CEA: 1.5 ng/mL

## 2019-01-18 ENCOUNTER — Other Ambulatory Visit: Payer: Self-pay

## 2019-01-18 ENCOUNTER — Ambulatory Visit (INDEPENDENT_AMBULATORY_CARE_PROVIDER_SITE_OTHER): Payer: Medicare Other | Admitting: General Practice

## 2019-01-18 DIAGNOSIS — Z5181 Encounter for therapeutic drug level monitoring: Secondary | ICD-10-CM

## 2019-01-18 DIAGNOSIS — I4891 Unspecified atrial fibrillation: Secondary | ICD-10-CM

## 2019-01-18 LAB — POCT INR: INR: 3.4 — AB (ref 2.0–3.0)

## 2019-01-18 NOTE — Progress Notes (Signed)
I have reviewed and agree with note, evaluation, plan.   Stephen Hunter, MD  

## 2019-01-18 NOTE — Patient Instructions (Addendum)
Pre visit review using our clinic review tool, if applicable. No additional management support is needed unless otherwise documented below in the visit note.  Continue to take 1 tablet daily except 1 1/2 tablets on Wednesday and Saturdays.  RE-check in 4 weeks.  Dosing info given to patient and he did verbalize understanding.

## 2019-01-27 ENCOUNTER — Other Ambulatory Visit: Payer: Self-pay | Admitting: Family Medicine

## 2019-01-27 DIAGNOSIS — Z5181 Encounter for therapeutic drug level monitoring: Secondary | ICD-10-CM

## 2019-02-07 ENCOUNTER — Ambulatory Visit: Payer: Medicare Other | Admitting: Cardiovascular Disease

## 2019-02-09 ENCOUNTER — Other Ambulatory Visit: Payer: Self-pay | Admitting: Family Medicine

## 2019-02-10 NOTE — Telephone Encounter (Signed)
See note

## 2019-02-10 NOTE — Telephone Encounter (Signed)
See note. Patient is scheduled 10/28.  Copied from [place] [id number]. Topic: General - Other >> Feb 10, 2019  9:39 AM Leward Quan A wrote: Reason for CRM: Patient called to inform Dr Yong Channel that his testicles are still swelling and want to know if he need to be on antibiotics for now say that it is the same thing that happened in 2016. Patient is also asking for a call back to know if he should call the Urologist. Patient can be reached at Ph# [phone]

## 2019-02-13 ENCOUNTER — Other Ambulatory Visit: Payer: Self-pay

## 2019-02-13 DIAGNOSIS — Z5181 Encounter for therapeutic drug level monitoring: Secondary | ICD-10-CM

## 2019-02-13 MED ORDER — WARFARIN SODIUM 2.5 MG PO TABS: ORAL_TABLET | ORAL | 1 refills | Status: DC

## 2019-02-15 ENCOUNTER — Other Ambulatory Visit: Payer: Self-pay

## 2019-02-15 ENCOUNTER — Ambulatory Visit (INDEPENDENT_AMBULATORY_CARE_PROVIDER_SITE_OTHER): Payer: Medicare Other | Admitting: General Practice

## 2019-02-15 ENCOUNTER — Ambulatory Visit: Payer: Medicare Other | Admitting: Family Medicine

## 2019-02-15 ENCOUNTER — Encounter: Payer: Self-pay | Admitting: Family Medicine

## 2019-02-15 VITALS — BP 132/76 | HR 62 | Temp 97.6°F | Ht 71.0 in | Wt 166.0 lb

## 2019-02-15 DIAGNOSIS — Z5181 Encounter for therapeutic drug level monitoring: Secondary | ICD-10-CM

## 2019-02-15 DIAGNOSIS — I10 Essential (primary) hypertension: Secondary | ICD-10-CM

## 2019-02-15 DIAGNOSIS — N5089 Other specified disorders of the male genital organs: Secondary | ICD-10-CM

## 2019-02-15 DIAGNOSIS — R6884 Jaw pain: Secondary | ICD-10-CM

## 2019-02-15 DIAGNOSIS — N1831 Chronic kidney disease, stage 3a: Secondary | ICD-10-CM | POA: Diagnosis not present

## 2019-02-15 DIAGNOSIS — M26609 Unspecified temporomandibular joint disorder, unspecified side: Secondary | ICD-10-CM | POA: Diagnosis not present

## 2019-02-15 DIAGNOSIS — N183 Chronic kidney disease, stage 3 unspecified: Secondary | ICD-10-CM | POA: Insufficient documentation

## 2019-02-15 LAB — POCT INR: INR: 2.6 (ref 2.0–3.0)

## 2019-02-15 NOTE — Progress Notes (Signed)
Phone [phone]   Subjective:  William Mahoney is a 85 y.o. year old very pleasant male patient who presents for/with See problem oriented charting Chief Complaint  Patient presents with  . Temporomandibular Joint Pain  . Testicle Pain   ROS- Review of Systems  Constitutional: Negative.   HENT: Negative.   Eyes: Negative.   Respiratory: Negative.   Cardiovascular: Negative.   Gastrointestinal: Negative.   Genitourinary: Negative.   Musculoskeletal: Negative.   Skin: Negative.   Neurological: Negative.   Endo/Heme/Allergies: Negative.   Psychiatric/Behavioral: Negative.    Past Medical History-  Patient Active Problem List   Diagnosis Date Noted  . Edema 11/10/2016    Priority: High  . Atrial fibrillation ([place]) 12/07/2008    Priority: High  . Chronic kidney disease (CKD), stage III (moderate) 02/15/2019    Priority: Medium  . Insomnia 10/19/2014    Priority: Medium  . Gout 01/09/2014    Priority: Medium  . History of cancer of rectosigmoid junction 01/04/2008    Priority: Medium  . Former smoker 06/23/2007    Priority: Medium  . Hyperlipidemia, unspecified 02/09/2007    Priority: Medium  . Essential hypertension 12/07/2006    Priority: Medium  . COPD 12/07/2006    Priority: Medium  . BPH (benign prostatic hyperplasia) 12/07/2006    Priority: Medium  . Allergic rhinitis 08/15/2013    Priority: Low  . 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 Inguinal hernia 03/21/2010    Priority: Low  . BASAL CELL CARCINOMA SKIN LOWER LIMB INCL HIP 03/21/2010    Priority: Low  . Actinic keratosis 12/07/2008    Priority: Low  . TMJ (temporomandibular joint disorder) 02/09/2007    Priority: Low  . URINARY INCONTINENCE 12/07/2006    Priority: Low  . PANCREATITIS, HX OF 12/07/2006    Priority: Low  . Aortic atherosclerosis ([place]) 01/05/2019  . Thrombocytopenia ([place]) 11/10/2016  . Encounter for therapeutic drug monitoring 08/27/2014    Medications-  reviewed and updated Current Outpatient Medications  Medication Sig Dispense Refill  . allopurinol (ZYLOPRIM) 300 MG tablet TAKE ONE-HALF TABLET BY MOUTH IN THE EVENING 45 tablet 1  . amLODipine (NORVASC) 2.5 MG tablet Take 1 tablet (2.5 mg total) by mouth daily. 90 tablet 1  . colchicine 0.6 MG tablet Take 0.6 mg by mouth 2 (two) times daily as needed (gout).    . fish oil-omega-3 fatty acids 1000 MG capsule Take 1 g by mouth daily.    . fluticasone (FLONASE) 50 MCG/ACT nasal spray Place 2 sprays into both nostrils daily. 16 g 6  . furosemide (LASIX) 20 MG tablet TAKE 1 TABLET BY MOUTH EVERY DAY AS NEEDED 90 tablet 1  . metoprolol tartrate (LOPRESSOR) 25 MG tablet TAKE 1/2 TABLET BY MOUTH TWICE A DAY 30 tablet 0  . Multiple Vitamin (MULTIVITAMIN) tablet Take 1 tablet by mouth daily.    . traMADol (ULTRAM) 50 MG tablet Take 1 tablet (50 mg total) by mouth every 8 (eight) hours as needed for moderate pain or severe pain (dont drive for 8 hours after taking. 1 refill per month). 20 tablet 5  . warfarin (COUMADIN) 2.5 MG tablet Take as directed 40 tablet 1  . zolpidem (AMBIEN) 5 MG tablet TAKE 1/2 (ONE-HALF) TABLET BY MOUTH AT BEDTIME AS NEEDED FOR SLEEP 15 tablet 5  . triamterene-hydrochlorothiazide (MAXZIDE-25) 37.5-25 MG tablet Take 0.5 tablets by mouth daily. 45 tablet 3   No current facility-administered medications for this visit.  Objective:  BP 132/76   Pulse 62   Temp 97.6 F (36.4 C) (Temporal)   Ht 5\' 11"  (1.803 m)   Wt 166 lb (75.3 kg)   SpO2 97%   BMI 23.15 kg/m  Gen: NAD, resting comfortably Patient with some tenderness over left jaw with palpation and also slight grinding sensation with opening closing jaw CV: Irregularly irregular no murmurs rubs or gallops Lungs: CTAB no crackles, wheeze, rhonchi Ext: no edema Skin: warm, dry  GU: Left testicle appears normal, right testicle possibly mildly enlarged-fluid-filled pouch surrounding right testicle-possible hydrocele     Assessment and Plan   #Left jaw pain-presumed TMJ (temporomandibular joint disorder) S: Has had ongoing issue with Jaw pain for several months-started in January 2020 with bilateral jaw pain worse on the left. Has seen dentist and received new dentures. Did not have any improvement. Has been treated with muscle relaxant as well as pain medication with no improvement. Pain is on left side into ear. Tylenol helps some. Tramadol comparable to tylenol.  Baclofen was helpful for a period  Back in March.  Had referred to PT for exercises well in March but he ultimately declined due to COVID-19 pandemic at a later date. A/P: Patient with 10 months of bilateral jaw pain-now primarily concentrated on the left.  Pain sometimes feels like it is in his ear, sometimes over the jaw, sometimes down the lower portion of jaw.  No chest pain or shortness of breath.  No exertional symptoms related to this.  He has got new dentures without improvement despite improved dental alignment -We discussed options today and he would like to be referred to ENT for their opinion/potential neck steps  #Right scrotum swelling S: 10/19/14 had enlarged left testicle- had evolving hydrocele on ultrasound 11 days apart-eventually had to have surgery.  He was also treated at that time for orchitis/epididymitis.[name]  Ultimately diagnosed with epididymitis with reactive hydrocele which improved after surgical intervention for loculation/drainage.  For last two or three weeks has had increased dswelling around the right testicle. He denies any redness or tenderness to the area. No changes in urine output or stream for his personal history.  He thought enlargement was on the side he had had prior surgery but upon review it is the opposite side.  States seem to be enlarging over the first week he noted it and stable over the last week.  Denies pain.  He feels like it is larger than the left testicle was when he had surgery A/P: 85 year old  male with right scrotum swelling.  Will get ultrasound.  I wonder if this could simply be hydrocele-would not likely intervene as pain less at this point if so.  Could refer back to urology if needed.  #hypertension S: compliant with triamterene hydrochlorothiazide half tablet, amlodipine, metoprolol.  Uses Lasix as needed BP Readings from Last 3 Encounters:  02/15/19 132/76  01/05/19 130/80  11/18/18 (!) 147/85  A/P:  Stable. Continue current medications.     Chronic kidney disease (CKD), stage III (moderate) S: GFR is primarily in the 50s though intermittently in the 60s over the last few years.  We discussed this diagnosis.   A/P: Reviewed diagnosis.  No intervention needed at this time-continue to control blood pressure.  He should avoid NSAIDs   Recommended follow up: As needed for acute issue-would recommend at least every 68-month visits that we did not specifically discussed that recommendation today Future Appointments  Date Time Provider [place]  03/03/2019  9:30  AM Lorretta Harp, MD [facility]  03/22/2019 10:30 AM [facility]   Lab/Order associations:   ICD-10-CM   1. TMJ (temporomandibular joint disorder)  M26.609 Ambulatory referral to ENT  2. Jaw pain  R68.84 Ambulatory referral to ENT  3. Swelling of right half of scrotum  N50.89 US SCROTUM W/DOPPLER  4. Stage 3a chronic kidney disease  N18.31   5. Essential hypertension  I10    Return precautions advised.  [name], MD

## 2019-02-15 NOTE — Patient Instructions (Addendum)
We will call you within two weeks about your referral to ENT. If you do not hear within 3 weeks, give [place] a call.   We will call you within two weeks about your referral for ultrasound of right scrotum. If you do not hear within 3 weeks, give [place] a call. If you notice pain or continued increasing in size please let us know.   No other changes today

## 2019-02-15 NOTE — Assessment & Plan Note (Signed)
S: GFR is primarily in the 50s though intermittently in the 60s over the last few years.  We discussed this diagnosis.   A/P: Reviewed diagnosis.  No intervention needed at this time-continue to control blood pressure.  He should avoid NSAIDs

## 2019-02-15 NOTE — Patient Instructions (Addendum)
Pre visit review using our clinic review tool, if applicable. No additional management support is needed unless otherwise documented below in the visit note.  Continue to take 1 tablet daily except 1 1/2 tablets on Wednesday and Saturdays.  RE-check in 4 weeks.  Dosing info given to patient and he did verbalize understanding.

## 2019-02-15 NOTE — Progress Notes (Signed)
I have reviewed and agree with note, evaluation, plan.   Stephen Hunter, MD  

## 2019-02-20 ENCOUNTER — Other Ambulatory Visit: Payer: Self-pay

## 2019-02-20 ENCOUNTER — Telehealth: Payer: Self-pay | Admitting: Family Medicine

## 2019-02-20 DIAGNOSIS — N5089 Other specified disorders of the male genital organs: Secondary | ICD-10-CM

## 2019-02-20 NOTE — Telephone Encounter (Signed)
See note

## 2019-02-20 NOTE — Telephone Encounter (Signed)
Attempted to call patient to schedule a same day appointment slot for patient to come into the office (after covid screenign questions are done) for left ear hurting. Phone kept ringing and did not have voicemail come on. Awaiting patient's call back to schedule. No need to route note. For documentation purposes only

## 2019-02-20 NOTE — Telephone Encounter (Signed)
Copied from [place] [id number]. Topic: General - Other >> Feb 20, 2019  8:19 AM Celene Kras A wrote: Reason for CRM: Pt called stating his left ear is starting to hurt and he is requesting to have some ear drops sent in for him. Pt also states his testicle has gotten larger and pt is requesting to have an appt with the urologist. Please advise.  [id number] - , [place] [address] Phone: [phone] Fax: [phone] Not a 24 hour pharmacy; exact hours not known.

## 2019-02-20 NOTE — Telephone Encounter (Signed)
Please schedule pt for appointment, SDA is fine. Referral to urology placed for pt.

## 2019-02-22 ENCOUNTER — Telehealth: Payer: Self-pay | Admitting: Family Medicine

## 2019-02-22 NOTE — Telephone Encounter (Signed)
Please advise 

## 2019-02-22 NOTE — Telephone Encounter (Signed)
Called patient gave information on app and Dr. Ronney Lion message

## 2019-02-22 NOTE — Telephone Encounter (Signed)
Do you know when patient u/s is scheduled? c

## 2019-02-22 NOTE — Telephone Encounter (Signed)
Copied from [place] [id number]. Topic: General - Other >> Feb 22, 2019  8:51 AM Carolyn Stare wrote: Pt would like a call back about where he is suppose to go for an appt concerning his US scrotum

## 2019-02-22 NOTE — Telephone Encounter (Signed)
Typically for hydrocele antibiotics are not recommended.  If he has swelling of the epididymis it may be indicated-when is his scrotum ultrasound scheduled?

## 2019-02-22 NOTE — Telephone Encounter (Signed)
I called patient to talk about his urology referral - he wants to know if he should be on antibiotics prior to getting in with urology?  He would like a call back from Dr [facility] CMA.  He doesn't mind waiting on urology - he just doesn't want this to get worse - he said it may be a little worse since he has been in the office.    CB # [id number]

## 2019-02-22 NOTE — Telephone Encounter (Signed)
I got him an appt on Monday for the ultrasound - im just waiting for him to answer the phone so I can tell him!

## 2019-02-22 NOTE — Telephone Encounter (Signed)
He doesn't have an appt yet.  I'll reach out to [facility] to get him scheduled.

## 2019-02-22 NOTE — Telephone Encounter (Signed)
Just let me know so I can let Dr. Yong Channel know.

## 2019-02-23 ENCOUNTER — Ambulatory Visit: Payer: Medicare Other | Admitting: Family Medicine

## 2019-02-27 ENCOUNTER — Ambulatory Visit
Admission: RE | Admit: 2019-02-27 | Discharge: 2019-02-27 | Disposition: A | Discharge status: Home / Self Care | Payer: Medicare Other | Source: Ambulatory Visit | Attending: Family Medicine | Admitting: Family Medicine

## 2019-02-27 DIAGNOSIS — N5089 Other specified disorders of the male genital organs: Secondary | ICD-10-CM

## 2019-03-03 ENCOUNTER — Ambulatory Visit: Payer: Medicare Other | Admitting: Cardiovascular Disease

## 2019-03-06 ENCOUNTER — Telehealth: Payer: Self-pay

## 2019-03-06 NOTE — Telephone Encounter (Signed)
Called daughter reviewed ultrasound. Let her know that patient did review on my chart that is why we did not call with results.

## 2019-03-06 NOTE — Telephone Encounter (Signed)
Copied from [place] [phone]. Topic: General - Inquiry >> Mar 06, 2019  1:50 PM Virl Axe D wrote: Reason for CRM: Pt's daughter stated pt had [place] on 02/27/19 and had not gotten any results but received a call to be scheduled with a surgeon. Pt is very worried. Please follow up as soon as possible with pt or daughters regarding results.

## 2019-03-22 ENCOUNTER — Ambulatory Visit: Payer: Medicare Other

## 2019-03-29 ENCOUNTER — Ambulatory Visit (INDEPENDENT_AMBULATORY_CARE_PROVIDER_SITE_OTHER): Payer: Medicare Other | Admitting: General Practice

## 2019-03-29 ENCOUNTER — Other Ambulatory Visit: Payer: Self-pay

## 2019-03-29 DIAGNOSIS — Z7901 Long term (current) use of anticoagulants: Secondary | ICD-10-CM | POA: Diagnosis not present

## 2019-03-29 DIAGNOSIS — Z5181 Encounter for therapeutic drug level monitoring: Secondary | ICD-10-CM

## 2019-03-29 LAB — POCT INR: INR: 3.2 — AB (ref 2.0–3.0)

## 2019-03-29 NOTE — Progress Notes (Signed)
I have reviewed and agree with note, evaluation, plan.   Mylon Mabey, MD  

## 2019-03-29 NOTE — Patient Instructions (Signed)
Pre visit review using our clinic review tool, if applicable. No additional management support is needed unless otherwise documented below in the visit note.  Hold dosage today and then continue to take 1 tablet daily except 1 1/2 tablets on Wednesday and Saturdays.  RE-check in 4 weeks.

## 2019-03-30 ENCOUNTER — Encounter: Payer: Self-pay | Admitting: Family Medicine

## 2019-03-30 ENCOUNTER — Ambulatory Visit (INDEPENDENT_AMBULATORY_CARE_PROVIDER_SITE_OTHER): Payer: Medicare Other | Admitting: Family Medicine

## 2019-03-30 VITALS — BP 124/88 | HR 46 | Temp 97.8°F | Ht 71.0 in | Wt 169.8 lb

## 2019-03-30 DIAGNOSIS — J449 Chronic obstructive pulmonary disease, unspecified: Secondary | ICD-10-CM

## 2019-03-30 DIAGNOSIS — I480 Paroxysmal atrial fibrillation: Secondary | ICD-10-CM | POA: Diagnosis not present

## 2019-03-30 DIAGNOSIS — J301 Allergic rhinitis due to pollen: Secondary | ICD-10-CM

## 2019-03-30 DIAGNOSIS — S0003XA Contusion of scalp, initial encounter: Secondary | ICD-10-CM

## 2019-03-30 DIAGNOSIS — I10 Essential (primary) hypertension: Secondary | ICD-10-CM

## 2019-03-30 DIAGNOSIS — R1084 Generalized abdominal pain: Secondary | ICD-10-CM

## 2019-03-30 MED ORDER — METOPROLOL SUCCINATE ER 25 MG PO TB24: 12.5000 mg | ORAL_TABLET | Freq: Every day | ORAL | 3 refills | Status: DC

## 2019-03-30 MED ORDER — FLUTICASONE PROPIONATE 50 MCG/ACT NA SUSP: 2.0000 | Freq: Every day | NASAL | 3 refills | Status: DC

## 2019-03-30 NOTE — Patient Instructions (Addendum)
This appears to be a small hematoma(which is a pocket of blood under the skin) thatwithout fall or injury somewhat atypical. I asked him to monitor this and return to see [place] if increases in size. Usually a hematoma  will resolve within at least a few months.  Without having headache in this area and with you feeling pretty well overall- we opted not to get a ct scan of your head at this time but if you get headache then please let us know and we can scan your head given you are on coumadin.   With INR being slightly high yesterday- the thinner blood would make it easier for you to get a hematoma- since you are adjusting your dose- that should help make this better over time.   Unclear etiology of abdominal stretching sensation- could be related to muscle strain. He has avoided heavy lifting though so unclear cause. We discussed trying a heating pad when it bothers him along with tylenol and let us know if symptoms fail to improve within the next 2 weeks   We will call you within two weeks about your referral to ENT- apparently the first ENT they sent you to does not specialize in TMJ so we are going to send to another location- sounds like first location had an appointment on 11/23 for you but possible misunderstood on call with wife. . If you do not hear within 3 weeks, give [place] a call.   with slow heart rate today-  continue metoprolol but reduce from 12.5mg  once a day instant release to 12.5mg  once a day extended release (make this change once you get new prescription [facility]) .   Monitor blood pressure at home- goal is to stay under 140/90 with reducing metoprolol. Hoping heart rate gets up into the 50s and out of the 40s  Recommended follow [id number] in about 6 months (around 09/28/2019) for physical or sooner if needed.

## 2019-03-30 NOTE — Progress Notes (Signed)
Phone [phone] In person visit   Subjective:   William Mahoney is a 85 y.o. year old very pleasant male patient who presents for/with See problem oriented charting Chief Complaint  Patient presents with  . Follow-up  . lump on head  . swollen abdomen   ROS- Review of Systems  Constitutional: Negative.   HENT: Positive for ear pain (Left ear ache a little).   Eyes: Negative.   Respiratory: Negative.   Cardiovascular: Negative.   Gastrointestinal: Negative.   Genitourinary: Negative.   Musculoskeletal: Positive for back pain and myalgias (in the abdomen).  Skin: Negative.   Neurological: Negative.   Endo/Heme/Allergies: Bruises/bleeds easily (on coumadin).  Psychiatric/Behavioral: The patient has insomnia (on Ambien).     This visit occurred during the SARS-CoV-2 public health emergency.  Safety protocols were in place, including screening questions prior to the visit, additional usage of staff PPE, and extensive cleaning of exam room while observing appropriate contact time as indicated for disinfecting solutions.   Past Medical History-  Patient Active Problem List   Diagnosis Date Noted  . Edema 11/10/2016    Priority: High  . Atrial fibrillation ([place]) 12/07/2008    Priority: High  . Chronic kidney disease (CKD), stage III (moderate) 02/15/2019    Priority: Medium  . Insomnia 10/19/2014    Priority: Medium  . Gout 01/09/2014    Priority: Medium  . History of cancer of rectosigmoid junction 01/04/2008    Priority: Medium  . Former smoker 06/23/2007    Priority: Medium  . Hyperlipidemia, unspecified 02/09/2007    Priority: Medium  . Essential hypertension 12/07/2006    Priority: Medium  . COPD 12/07/2006    Priority: Medium  . BPH (benign prostatic hyperplasia) 12/07/2006    Priority: Medium  . Allergic rhinitis 08/15/2013    Priority: Low  . 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 Inguinal hernia 03/21/2010    Priority: Low  . BASAL  CELL CARCINOMA SKIN LOWER LIMB INCL HIP 03/21/2010    Priority: Low  . Actinic keratosis 12/07/2008    Priority: Low  . TMJ (temporomandibular joint disorder) 02/09/2007    Priority: Low  . URINARY INCONTINENCE 12/07/2006    Priority: Low  . PANCREATITIS, HX OF 12/07/2006    Priority: Low  . Aortic atherosclerosis ([place]) 01/05/2019  . Thrombocytopenia ([place]) 11/10/2016  . Encounter for therapeutic drug monitoring 08/27/2014    Medications- reviewed and updated Current Outpatient Medications  Medication Sig Dispense Refill  . allopurinol (ZYLOPRIM) 300 MG tablet TAKE ONE-HALF TABLET BY MOUTH IN THE EVENING 45 tablet 1  . amLODipine (NORVASC) 2.5 MG tablet Take 1 tablet (2.5 mg total) by mouth daily. 90 tablet 1  . colchicine 0.6 MG tablet Take 0.6 mg by mouth 2 (two) times daily as needed (gout).    . fish oil-omega-3 fatty acids 1000 MG capsule Take 1 g by mouth daily.    . furosemide (LASIX) 20 MG tablet TAKE 1 TABLET BY MOUTH EVERY DAY AS NEEDED 90 tablet 1  . metoprolol tartrate (LOPRESSOR) 25 MG tablet TAKE 1/2 TABLET BY MOUTH TWICE A DAY 30 tablet 0  . Multiple Vitamin (MULTIVITAMIN) tablet Take 1 tablet by mouth daily.    . traMADol (ULTRAM) 50 MG tablet Take 1 tablet (50 mg total) by mouth every 8 (eight) hours as needed for moderate pain or severe pain (dont drive for 8 hours after taking. 1 refill per month). 20 tablet 5  . triamterene-hydrochlorothiazide (MAXZIDE-25) 37.5-25  MG tablet Take 0.5 tablets by mouth daily. 45 tablet 3  . warfarin (COUMADIN) 2.5 MG tablet Take as directed 40 tablet 1  . zolpidem (AMBIEN) 5 MG tablet TAKE 1/2 (ONE-HALF) TABLET BY MOUTH AT BEDTIME AS NEEDED FOR SLEEP 15 tablet 5  . fluticasone (FLONASE) 50 MCG/ACT nasal spray Place 2 sprays into both nostrils daily. 48 g 3  . metoprolol succinate (TOPROL-XL) 25 MG 24 hr tablet Take 0.5 tablets (12.5 mg total) by mouth daily. 45 tablet 3   No current facility-administered medications for this visit.      Objective:  BP 124/88 (BP Location: Left Arm, Patient Position: Sitting, Cuff Size: Normal)   Pulse (!) 46   Temp 97.8 F (36.6 C) (Temporal)   Ht 5\' 11"  (1.803 m)   Wt 169 lb 12.8 oz (77 kg)   SpO2 99%   BMI 23.68 kg/m  Gen: NAD, resting comfortably On scalp- there is 4 x 4 xm slightly raised area with slight purplish discoloration- appears to be hematoma  CV: irregularly irregular and bradycardic, no murmurs rubs or gallops Lungs: CTAB no crackles, wheeze, rhonchi Abdomen: soft/nontender/nondistended/normal bowel sounds. No rebound or guarding.  Ext: trace to 1+ edema Skin: warm, dry    Assessment and Plan  Lump on his Head S: Patient states that he noticed the lump on the front of his head about 4 to 5 days ago. Stable in size since he noted it.  It is not painful. Patient denies any injuries. Yolanda Bonine was first to note it. Patient is on coumadin.  4 x 4 cm growth  No headache- very mild aching in back of his head at times that comes and goes. A/P: This appears to be a small hematoma that is rather flat- without fall or injury somewhat atypical. I asked him to monitor this and return to see [place] if increases in size. Usually a hematoma (which is a pocket of blood under the skin) will resolve within at least a few months.    With INR being slightly high yesterday- the thinner blood would make it easier for you to get a hematoma- since you are adjusting your dose- that should help make this better over time. Without having headache in this area and with you feeling pretty well overall- we opted not to get a ct scan of your head at this time but if you get headache then please let us know and we can scan your head given you are on coumadin.   # diffuse abdominal pain S:Pain started again for last 1-2 months. Feels like abdomen is stretching and seems to come and go.   Last 1-2 weeks has been more steady and slightly stronger and can feel around to his left side slightly into axilla.  No chest pain or shortness of breath.    Patient did have a CT scan in July 2020 on the 24th-there were no CT findings to explain pain at that time. There were postoperative findings from rectal resection and anastomosis.  No abdominal mass or lymphadenopathy at that time  On that scan he did have multiple aneurysmal dilations of the aorta-referral was placed to vascular surgery-he saw them and plan was for annual Scans with vascular.   Also saw urology back for scrotum swelling and was advised to monitor as long as he can live with it.  No testicular pain at present.   No recent weight loss- slight gain- feels slight abdominal distension A/P: Unclear etiology of abdominal stretching sensation-  could be related to muscle strain. He has avoided heavy lifting though so unclear cause. We discussed trying a heating pad when it bothers him along with tylenol and let us know if symptoms fail to improve within the next 2 weeks  -discussed with 1st 2 issues- scalp hematoma and history thrombocytopenia as well as abd pain could do CBC/CMP- he feels like symptoms are mild and wants to wait on this  #TMJD patient received  call from ENT - but wife with some memory changesmisunderstood call. Wants # for him to be able to call back From avs "We will call you within two weeks about your referral to ENT- apparently the first ENT they sent you to does not specialize in TMJ so we are going to send to another location- sounds like first location had an appointment on 11/23 for you but possible misunderstood on call with wife. . If you do not hear within 3 weeks, give [place] a call. "  # allergic rhinitis S:has run out of flonase and notes worsening runny nose for last month or so  A/P: mild poor control-  flonase was helpful and asks for refill - done today - due to chronicity did not opt to test for covid  # atrial fibrillation/hypertension S:anticoagualated with coumadin- INR slightly high yesterday. Also on  metoprolol 12.5mg  mg twice a day with HR in 40s  Hypertension is contorlled on maxzide 25 mg, metoprolol 12.5mg  BID, lasix 20mg  prn, amlodipine 2.5 mg A/P: a fib rate controlled-continue warfarin with dose adjustment. For rate control- with slow heart rate today-  continue metoprolol but reduce from 12.5mg  once a day instant release to 12.5mg  once a day extended release (make this change once you get new prescription [facility])   For hypertension- hopeful BP remains controlled while on lower dose metoprolol- asked him to monitor at home.   #copd- was in problem list since 2009 at least. No reported issues. Quit smoking 2009  Recommended follow up:  6 months or sooner if needed Future Appointments  Date Time Provider [place]  04/26/2019 11:00 AM [facility]   Lab/Order associations:   ICD-10-CM   1. Hematoma of frontal scalp, initial encounter  S00.03XA   2. Paroxysmal atrial fibrillation (HCC)  I48.0   3. Essential hypertension  I10   4. Seasonal allergic rhinitis due to pollen  J30.1   5. Diffuse abdominal pain  R10.84   6. Chronic obstructive pulmonary disease, unspecified COPD type ([place]) Chronic J44.9     Meds ordered this encounter  Medications  . fluticasone (FLONASE) 50 MCG/ACT nasal spray    Sig: Place 2 sprays into both nostrils daily.    Dispense:  48 g    Refill:  3  . metoprolol succinate (TOPROL-XL) 25 MG 24 hr tablet    Sig: Take 0.5 tablets (12.5 mg total) by mouth daily.    Dispense:  45 tablet    Refill:  3    Return precautions advised.  [name], MD

## 2019-04-17 ENCOUNTER — Other Ambulatory Visit: Payer: Self-pay | Admitting: Family Medicine

## 2019-04-26 ENCOUNTER — Other Ambulatory Visit: Payer: Self-pay

## 2019-04-26 ENCOUNTER — Ambulatory Visit (INDEPENDENT_AMBULATORY_CARE_PROVIDER_SITE_OTHER): Payer: Medicare Other | Admitting: General Practice

## 2019-04-26 DIAGNOSIS — Z7901 Long term (current) use of anticoagulants: Secondary | ICD-10-CM

## 2019-04-26 LAB — POCT INR: INR: 2.4 (ref 2.0–3.0)

## 2019-04-26 NOTE — Progress Notes (Signed)
I have reviewed and agree with note, evaluation, plan.   Stephen Hunter, MD  

## 2019-04-26 NOTE — Patient Instructions (Addendum)
Pre visit review using our clinic review tool, if applicable. No additional management support is needed unless otherwise documented below in the visit note.  Continue to take 1 tablet daily except 1 1/2 tablets on Wednesday and Saturdays.  RE-check in 4 weeks.

## 2019-05-04 ENCOUNTER — Ambulatory Visit: Payer: Medicare Other | Attending: Internal Medicine

## 2019-05-04 DIAGNOSIS — Z23 Encounter for immunization: Secondary | ICD-10-CM | POA: Insufficient documentation

## 2019-05-04 NOTE — Progress Notes (Signed)
   Covid-19 Vaccination Clinic  Name:  William Mahoney    MRN: [id number] DOB: 1925-10-20  05/04/2019  [name] was observed post Covid-19 immunization for 30 minutes based on pre-vaccination screening without incidence. He was provided with Vaccine Information Sheet and instruction to access the V-Safe system.   [name] was instructed to call 911 with any severe reactions post vaccine: [name] Difficulty breathing  . Swelling of your face and throat  . A fast heartbeat  . A bad rash all over your body  . Dizziness and weakness    Immunizations Administered    Name Date Dose VIS Date Route   Pfizer COVID-19 Vaccine 05/04/2019  8:53 AM 0.3 mL 03/31/2019 Intramuscular   Manufacturer: [place]   Lot: [id number]   [place]: [id number]

## 2019-05-22 ENCOUNTER — Ambulatory Visit: Payer: Medicare Other | Attending: Internal Medicine

## 2019-05-22 DIAGNOSIS — Z23 Encounter for immunization: Secondary | ICD-10-CM | POA: Insufficient documentation

## 2019-05-22 NOTE — Progress Notes (Signed)
   Covid-19 Vaccination Clinic  Name:  Sachit Franchina    MRN: [id number] DOB: [birth year]  05/22/2019  Mr. Priddy was observed post Covid-19 immunization for 15 minutes without incidence. He was provided with Vaccine Information Sheet and instruction to access the V-Safe system.   Mr. Vannest was instructed to call 911 with any severe reactions post vaccine: [name] Difficulty breathing  . Swelling of your face and throat  . A fast heartbeat  . A bad rash all over your body  . Dizziness and weakness    Immunizations Administered    Name Date Dose VIS Date Route   Pfizer COVID-19 Vaccine 05/22/2019  8:16 AM 0.3 mL 03/31/2019 Intramuscular   Manufacturer: [place]   Lot: [id number]   [place]: [id number]

## 2019-05-24 ENCOUNTER — Ambulatory Visit (INDEPENDENT_AMBULATORY_CARE_PROVIDER_SITE_OTHER): Payer: Medicare Other | Admitting: General Practice

## 2019-05-24 ENCOUNTER — Other Ambulatory Visit: Payer: Self-pay

## 2019-05-24 DIAGNOSIS — Z5181 Encounter for therapeutic drug level monitoring: Secondary | ICD-10-CM | POA: Diagnosis not present

## 2019-05-24 LAB — POCT INR: INR: 2.8 (ref 2.0–3.0)

## 2019-05-24 NOTE — Patient Instructions (Signed)
Pre visit review using our clinic review tool, if applicable. No additional management support is needed unless otherwise documented below in the visit note.  Continue to take 1 tablet daily except 1 1/2 tablets on Wednesday and Saturdays.  RE-check in 6 weeks.

## 2019-06-10 ENCOUNTER — Other Ambulatory Visit: Payer: Self-pay | Admitting: Family Medicine

## 2019-06-10 DIAGNOSIS — Z5181 Encounter for therapeutic drug level monitoring: Secondary | ICD-10-CM

## 2019-06-13 ENCOUNTER — Other Ambulatory Visit: Payer: Self-pay | Admitting: Family Medicine

## 2019-06-16 ENCOUNTER — Ambulatory Visit: Payer: Medicare Other | Admitting: Cardiovascular Disease

## 2019-06-27 ENCOUNTER — Ambulatory Visit (INDEPENDENT_AMBULATORY_CARE_PROVIDER_SITE_OTHER): Payer: Medicare Other | Admitting: Cardiovascular Disease

## 2019-06-27 ENCOUNTER — Other Ambulatory Visit: Payer: Self-pay

## 2019-06-27 ENCOUNTER — Encounter: Payer: Self-pay | Admitting: Cardiovascular Disease

## 2019-06-27 VITALS — BP 118/70 | HR 50 | Temp 97.3°F | Resp 21 | Ht 71.0 in | Wt 167.2 lb

## 2019-06-27 DIAGNOSIS — I4891 Unspecified atrial fibrillation: Secondary | ICD-10-CM | POA: Diagnosis not present

## 2019-06-27 DIAGNOSIS — E782 Mixed hyperlipidemia: Secondary | ICD-10-CM

## 2019-06-27 DIAGNOSIS — I10 Essential (primary) hypertension: Secondary | ICD-10-CM | POA: Diagnosis not present

## 2019-06-27 NOTE — Patient Instructions (Signed)
Medication Instructions:  Your physician recommends that you continue on your current medications as directed. Please refer to the Current Medication list given to you today.  If you need a refill on your cardiac medications before your next appointment, please call your pharmacy.   Lab work: NONE  Testing/Procedures: NONE  Follow-Up: At [facility], you and your health needs are our priority.  As part of our continuing mission to provide you with exceptional heart care, we have created designated Provider Care Teams.  These Care Teams include your primary Cardiologist (physician) and Advanced Practice Providers (APPs -  Physician Assistants and Nurse Practitioners) who all work together to provide you with the care you need, when you need it. You may see Dr. Berry or one of the following Advanced Practice Providers on your designated Care Team:    Luke Kilroy, PA-C  Callie Goodrich, PA-C  Jesse Cleaver, FNP  Your physician wants you to follow-up in: 1 year with Dr. Berry. You will receive a reminder letter in the mail two months in advance. If you don't receive a letter, please call our office to schedule the follow-up appointment.      

## 2019-06-27 NOTE — Assessment & Plan Note (Signed)
History of essential hypertension blood pressure measured today 118/70.  He is on amlodipine, metoprolol and Maxide.

## 2019-06-27 NOTE — Progress Notes (Signed)
06/27/2019 William Mahoney   May 10, 1925  [id number]  Primary Physician Yong Channel Brayton Mars, MD Primary Cardiologist: Lorretta Harp MD Lupe Carney, [place]  HPI:  William Mahoney is a 86 y.o.  thin-appearing married Caucasian male father of 76 children, grandfather of 21 grandchildren referred for preoperative clearance before a elective right inguinal hernia repair which was successfully performed by Dr. Dalbert Batman. His primary care physician is Dr. Yong Channel. I last saw him in the office  07/20/2017.Marland KitchenHis cardiovascular risk factor profile is notable for 90 pack years of tobacco abuse having quit 6 years ago. History of hypertension. He has never had a heart attack or stroke. He has had colon cancer which was surgically excised profile/31/09 after she stops smoking. He does have chronic atrial fibrillation only on aspirin though his CHA2DSVASC2 score is 2. I did begin him on Coumadin anticoagulation as a result of this.He had a stress test 15 years ago which led to cardiac catheterization which was apparently normal.  Since I saw him 2 years ago he continues to do well.  He remains on Coumadin for his A. fib.  He denies chest pain or shortness of breath.  He did see Dr. Donzetta Matters in the office 2 years ago for a 4.3 cm abdominal aortic aneurysm.    Current Meds  Medication Sig  . allopurinol (ZYLOPRIM) 300 MG tablet TAKE ONE-HALF TABLET BY MOUTH IN THE EVENING  . amLODipine (NORVASC) 2.5 MG tablet Take 1 tablet (2.5 mg total) by mouth daily.  . colchicine 0.6 MG tablet Take 0.6 mg by mouth 2 (two) times daily as needed (gout).  . fish oil-omega-3 fatty acids 1000 MG capsule Take 1 g by mouth daily.  . fluticasone (FLONASE) 50 MCG/ACT nasal spray Place 2 sprays into both nostrils daily.  . furosemide (LASIX) 20 MG tablet TAKE 1 TABLET BY MOUTH EVERY DAY AS NEEDED  . metoprolol succinate (TOPROL-XL) 25 MG 24 hr tablet Take 0.5 tablets (12.5 mg total) by mouth daily.  . Multiple Vitamin (MULTIVITAMIN)  tablet Take 1 tablet by mouth daily.  . traMADol (ULTRAM) 50 MG tablet Take 1 tablet (50 mg total) by mouth every 8 (eight) hours as needed for moderate pain or severe pain (dont drive for 8 hours after taking. 1 refill per month).  . triamterene-hydrochlorothiazide (MAXZIDE-25) 37.5-25 MG tablet TAKE 1/2 TABLET BY MOUTH DAILY  . warfarin (COUMADIN) 2.5 MG tablet TAKE 1 TABLET DAILY EXCEPT TAKE 1 1/2 TABLETS ON WED AND SAT OR TAKE AS DIRECTED BY CLINIC  . zolpidem (AMBIEN) 5 MG tablet TAKE 1/2 (ONE-HALF) TABLET BY MOUTH AT BEDTIME AS NEEDED FOR SLEEP  . [DISCONTINUED] metoprolol tartrate (LOPRESSOR) 25 MG tablet TAKE 1/2 TABLET BY MOUTH TWICE A DAY     No Known Allergies  Social History   Socioeconomic History  . Marital status: Married    Spouse name: Not on file  . Number of children: 6  . Years of education: Not on file  . Highest education level: Not on file  Occupational History  . Occupation: Retired    Fish farm manager: RETIRED  Tobacco Use  . Smoking status: Former Smoker    Packs/day: 1.00    Years: 55.00    Pack years: 55.00    Types: Cigarettes    Quit date: 03/20/2008    Years since quitting: 11.2  . Smokeless tobacco: Never Used  Substance and Sexual Activity  . Alcohol use: No    Comment: hx alcohol abuse -- quit in 1980's   .  Drug use: No  . Sexual activity: Not on file  Other Topics Concern  . Not on file  Social History Narrative   Married. 6 kids. Wife Joycelyn Schmid also a patient of Dr. Ronney Lion.       Retired.    Social Determinants of Health   Financial Resource Strain:   . Difficulty of Paying Living Expenses: Not on file  Food Insecurity:   . Worried About Charity fundraiser in the Last Year: Not on file  . Ran Out of Food in the Last Year: Not on file  Transportation Needs:   . Lack of Transportation (Medical): Not on file  . Lack of Transportation (Non-Medical): Not on file  Physical Activity:   . Days of Exercise per Week: Not on file  . Minutes of  Exercise per Session: Not on file  Stress:   . Feeling of Stress : Not on file  Social Connections:   . Frequency of Communication with Friends and Family: Not on file  . Frequency of Social Gatherings with Friends and Family: Not on file  . Attends Religious Services: Not on file  . Active Member of Clubs or Organizations: Not on file  . Attends Archivist Meetings: Not on file  . Marital Status: Not on file  Intimate Partner Violence:   . Fear of Current or Ex-Partner: Not on file  . Emotionally Abused: Not on file  . Physically Abused: Not on file  . Sexually Abused: Not on file     Review of Systems: General: negative for chills, fever, night sweats or weight changes.  Cardiovascular: negative for chest pain, dyspnea on exertion, edema, orthopnea, palpitations, paroxysmal nocturnal dyspnea or shortness of breath Dermatological: negative for rash Respiratory: negative for cough or wheezing Urologic: negative for hematuria Abdominal: negative for nausea, vomiting, diarrhea, bright red blood per rectum, melena, or hematemesis Neurologic: negative for visual changes, syncope, or dizziness All other systems reviewed and are otherwise negative except as noted above.    Blood pressure 118/70, pulse (!) 50, temperature (!) 97.3 F (36.3 C), resp. rate (!) 21, height 5\' 11"  (1.803 m), weight 167 lb 3.2 oz (75.8 kg).  General appearance: alert and no distress Neck: no adenopathy, no carotid bruit, no JVD, supple, symmetrical, trachea midline and thyroid not enlarged, symmetric, no tenderness/mass/nodules Lungs: clear to auscultation bilaterally Heart: irregularly irregular rhythm Extremities: extremities normal, atraumatic, no cyanosis or edema Pulses: 2+ and symmetric Skin: Skin color, texture, turgor normal. No rashes or lesions Neurologic: Alert and oriented X 3, normal strength and tone. Normal symmetric reflexes. Normal coordination and gait  EKG atrial fibrillation  with ventricular spots of 44 and right bundle branch block with nonspecific ST and T wave changes.  I personally reviewed this EKG.  ASSESSMENT AND PLAN:   Hyperlipidemia, unspecified History of hyperlipidemia not on statin therapy with lipid profile performed 01/05/2019 revealing a total cholesterol of 180, LDL 111 and HDL 33.  Essential hypertension History of essential hypertension blood pressure measured today 118/70.  He is on amlodipine, metoprolol and Maxide.  Atrial fibrillation History of chronic A. fib rate controlled on Coumadin anticoagulation followed by his PCP.      Lorretta Harp MD FACP,FACC,FAHA, [facility] 06/27/2019 11:39 AM

## 2019-06-27 NOTE — Assessment & Plan Note (Signed)
History of hyperlipidemia not on statin therapy with lipid profile performed 01/05/2019 revealing a total cholesterol of 180, LDL 111 and HDL 33.

## 2019-06-27 NOTE — Assessment & Plan Note (Signed)
History of chronic A. fib rate controlled on Coumadin anticoagulation followed by his PCP.

## 2019-07-04 ENCOUNTER — Other Ambulatory Visit: Payer: Self-pay | Admitting: Family Medicine

## 2019-07-05 ENCOUNTER — Other Ambulatory Visit: Payer: Self-pay | Admitting: Family Medicine

## 2019-07-05 ENCOUNTER — Ambulatory Visit (INDEPENDENT_AMBULATORY_CARE_PROVIDER_SITE_OTHER): Payer: Medicare Other | Admitting: General Practice

## 2019-07-05 ENCOUNTER — Other Ambulatory Visit: Payer: Self-pay

## 2019-07-05 DIAGNOSIS — Z5181 Encounter for therapeutic drug level monitoring: Secondary | ICD-10-CM

## 2019-07-05 LAB — POCT INR: INR: 3.1 — AB (ref 2.0–3.0)

## 2019-07-05 NOTE — Progress Notes (Signed)
I have reviewed and agree with note, evaluation, plan.   Espiridion Supinski, MD  

## 2019-07-05 NOTE — Patient Instructions (Addendum)
Pre visit review using our clinic review tool, if applicable. No additional management support is needed unless otherwise documented below in the visit note.  Continue to take 1 tablet daily except 1 1/2 tablets on Wednesday and Saturdays.  RE-check in 6 weeks.

## 2019-07-07 ENCOUNTER — Other Ambulatory Visit: Payer: Self-pay | Admitting: Family Medicine

## 2019-07-10 ENCOUNTER — Telehealth: Payer: Self-pay

## 2019-07-10 NOTE — Telephone Encounter (Signed)
Pending approval for Zolpidem 5 mg [place] Rx #: [id number] PA Case ID: [id number]   I will update once a decision has been made.

## 2019-08-11 ENCOUNTER — Other Ambulatory Visit: Payer: Self-pay | Admitting: Family Medicine

## 2019-08-16 ENCOUNTER — Ambulatory Visit (INDEPENDENT_AMBULATORY_CARE_PROVIDER_SITE_OTHER): Payer: Medicare Other | Admitting: General Practice

## 2019-08-16 ENCOUNTER — Other Ambulatory Visit: Payer: Self-pay

## 2019-08-16 DIAGNOSIS — I4891 Unspecified atrial fibrillation: Secondary | ICD-10-CM

## 2019-08-16 DIAGNOSIS — Z5181 Encounter for therapeutic drug level monitoring: Secondary | ICD-10-CM

## 2019-08-16 LAB — POCT INR: INR: 3 (ref 2.0–3.0)

## 2019-08-16 NOTE — Progress Notes (Signed)
I have reviewed and agree with note, evaluation, plan.   Lilley Hubble, MD  

## 2019-08-16 NOTE — Patient Instructions (Addendum)
Pre visit review using our clinic review tool, if applicable. No additional management support is needed unless otherwise documented below in the visit note.  Change dosage and take 1 tablet daily except 1 1/2 tablets on Saturdays only.  Re-check in 4 weeks.

## 2019-09-13 ENCOUNTER — Ambulatory Visit: Payer: Medicare Other

## 2019-09-14 ENCOUNTER — Other Ambulatory Visit: Payer: Self-pay | Admitting: Family Medicine

## 2019-09-20 ENCOUNTER — Other Ambulatory Visit: Payer: Self-pay

## 2019-09-20 ENCOUNTER — Ambulatory Visit (INDEPENDENT_AMBULATORY_CARE_PROVIDER_SITE_OTHER): Payer: Medicare Other | Admitting: General Practice

## 2019-09-20 DIAGNOSIS — Z5181 Encounter for therapeutic drug level monitoring: Secondary | ICD-10-CM

## 2019-09-20 LAB — POCT INR: INR: 2.2 (ref 2.0–3.0)

## 2019-09-20 NOTE — Progress Notes (Signed)
I have reviewed and agree with note, evaluation, plan.   Atira Borello, MD  

## 2019-09-20 NOTE — Patient Instructions (Addendum)
Pre visit review using our clinic review tool, if applicable. No additional management support is needed unless otherwise documented below in the visit note.  Change dosage and take 1 tablet daily except 1 1/2 tablets on Saturdays only.  Re-check in 6 weeks.

## 2019-10-05 ENCOUNTER — Other Ambulatory Visit: Payer: Self-pay

## 2019-10-05 ENCOUNTER — Encounter: Payer: Self-pay | Admitting: Family Medicine

## 2019-10-05 ENCOUNTER — Ambulatory Visit (INDEPENDENT_AMBULATORY_CARE_PROVIDER_SITE_OTHER): Payer: Medicare Other | Admitting: Family Medicine

## 2019-10-05 VITALS — BP 122/66 | HR 50 | Temp 97.8°F | Ht 71.0 in | Wt 166.4 lb

## 2019-10-05 DIAGNOSIS — N1831 Chronic kidney disease, stage 3a: Secondary | ICD-10-CM

## 2019-10-05 DIAGNOSIS — I714 Abdominal aortic aneurysm, without rupture, unspecified: Secondary | ICD-10-CM

## 2019-10-05 DIAGNOSIS — E782 Mixed hyperlipidemia: Secondary | ICD-10-CM

## 2019-10-05 DIAGNOSIS — I10 Essential (primary) hypertension: Secondary | ICD-10-CM | POA: Diagnosis not present

## 2019-10-05 DIAGNOSIS — M1A9XX0 Chronic gout, unspecified, without tophus (tophi): Secondary | ICD-10-CM

## 2019-10-05 DIAGNOSIS — F5101 Primary insomnia: Secondary | ICD-10-CM

## 2019-10-05 DIAGNOSIS — Z0000 Encounter for general adult medical examination without abnormal findings: Secondary | ICD-10-CM | POA: Diagnosis not present

## 2019-10-05 DIAGNOSIS — I4891 Unspecified atrial fibrillation: Secondary | ICD-10-CM

## 2019-10-05 DIAGNOSIS — Z85048 Personal history of other malignant neoplasm of rectum, rectosigmoid junction, and anus: Secondary | ICD-10-CM

## 2019-10-05 DIAGNOSIS — I700 Atherosclerosis of aorta: Secondary | ICD-10-CM

## 2019-10-05 DIAGNOSIS — N401 Enlarged prostate with lower urinary tract symptoms: Secondary | ICD-10-CM

## 2019-10-05 DIAGNOSIS — Z87891 Personal history of nicotine dependence: Secondary | ICD-10-CM

## 2019-10-05 DIAGNOSIS — J449 Chronic obstructive pulmonary disease, unspecified: Secondary | ICD-10-CM

## 2019-10-05 DIAGNOSIS — D696 Thrombocytopenia, unspecified: Secondary | ICD-10-CM

## 2019-10-05 LAB — CBC WITH DIFFERENTIAL/PLATELET
Basophils Absolute: 0 10*3/uL (ref 0.0–0.1)
Basophils Relative: 0.6 % (ref 0.0–3.0)
Eosinophils Absolute: 0.2 10*3/uL (ref 0.0–0.7)
Eosinophils Relative: 3.5 % (ref 0.0–5.0)
HCT: 45.3 % (ref 39.0–52.0)
Hemoglobin: 14.9 g/dL (ref 13.0–17.0)
Lymphocytes Relative: 24.8 % (ref 12.0–46.0)
Lymphs Abs: 1.4 10*3/uL (ref 0.7–4.0)
MCHC: 33 g/dL (ref 30.0–36.0)
MCV: 95.1 fl (ref 78.0–100.0)
Monocytes Absolute: 0.7 10*3/uL (ref 0.1–1.0)
Monocytes Relative: 13.4 % — ABNORMAL HIGH (ref 3.0–12.0)
Neutro Abs: 3.2 10*3/uL (ref 1.4–7.7)
Neutrophils Relative %: 57.7 % (ref 43.0–77.0)
Platelets: 115 10*3/uL — ABNORMAL LOW (ref 150.0–400.0)
RBC: 4.76 Mil/uL (ref 4.22–5.81)
RDW: 14.8 % (ref 11.5–15.5)
WBC: 5.6 10*3/uL (ref 4.0–10.5)

## 2019-10-05 LAB — COMPREHENSIVE METABOLIC PANEL
ALT: 11 U/L (ref 0–53)
AST: 19 U/L (ref 0–37)
Albumin: 4.4 g/dL (ref 3.5–5.2)
Alkaline Phosphatase: 68 U/L (ref 39–117)
BUN: 24 mg/dL — ABNORMAL HIGH (ref 6–23)
CO2: 33 mEq/L — ABNORMAL HIGH (ref 19–32)
Calcium: 9.5 mg/dL (ref 8.4–10.5)
Chloride: 101 mEq/L (ref 96–112)
Creatinine, Ser: 1.24 mg/dL (ref 0.40–1.50)
GFR: 54.53 mL/min — ABNORMAL LOW (ref >60.00)
Glucose, Bld: 96 mg/dL (ref 70–99)
Potassium: 4.9 mEq/L (ref 3.5–5.1)
Sodium: 140 mEq/L (ref 135–145)
Total Bilirubin: 0.8 mg/dL (ref 0.2–1.2)
Total Protein: 6.7 g/dL (ref 6.0–8.3)

## 2019-10-05 LAB — LIPID PANEL
Cholesterol: 182 mg/dL (ref 0–200)
HDL: 32.8 mg/dL — ABNORMAL LOW (ref >39.00)
LDL Cholesterol: 114 mg/dL — ABNORMAL HIGH (ref 0–99)
NonHDL: 148.79
Total CHOL/HDL Ratio: 6
Triglycerides: 172 mg/dL — ABNORMAL HIGH (ref 0.0–149.0)
VLDL: 34.4 mg/dL (ref 0.0–40.0)

## 2019-10-05 LAB — POC URINALSYSI DIPSTICK (AUTOMATED)
Bilirubin, UA: NEGATIVE
Blood, UA: NEGATIVE
Glucose, UA: NEGATIVE
Ketones, UA: NEGATIVE
Leukocytes, UA: NEGATIVE
Nitrite, UA: NEGATIVE
Protein, UA: NEGATIVE
Spec Grav, UA: >=1.03 — AB (ref 1.010–1.025)
Urobilinogen, UA: 0.2 E.U./dL
pH, UA: 5.5 (ref 5.0–8.0)

## 2019-10-05 LAB — TSH: TSH: 3.97 u[IU]/mL (ref 0.35–4.50)

## 2019-10-05 LAB — URIC ACID: Uric Acid, Serum: 5.8 mg/dL (ref 4.0–7.8)

## 2019-10-05 NOTE — Patient Instructions (Addendum)
Please stop by lab before you go If you have mychart- we will send your results within 3 business days of [place] receiving them.  If you do not have mychart- we will call you about results within 5 business days of [place] receiving them.   Please check with your pharmacy to see if they have the shingrix vaccine. If they do- please get this immunization and update [place] by phone call or mychart with dates you receive the vaccine  Call vascular surgery for follow up- we will order ultrasound to hopefully have before your visit  We will call you within two weeks about your referral for ultrasound. If you do not hear within 3 weeks, give [place] a call.

## 2019-10-05 NOTE — Addendum Note (Signed)
Addended by: Betti Cruz on: 10/05/2019 02:31 PM   Modules accepted: Orders

## 2019-10-05 NOTE — Progress Notes (Signed)
Phone: [phone]   Subjective:  Patient presents today for their annual physical. Chief complaint-noted.   See problem oriented charting- ROS- full  review of systems was completed and negative  except for: darker skin on legs, incontinence, polyuria, vision issues- follows closely with optho  The following were reviewed and entered/updated in epic: Past Medical History:  Diagnosis Date  . BPH (benign prostatic hypertrophy)   . Chronic atrial fibrillation [facility])    cardiologist--   dr berry  . COPD (chronic obstructive pulmonary disease) ([place])   . Diverticulosis of colon    MODERATE LEFT SIDE  . Epididymal cyst    w/ epididymalitis  . Full dentures   . 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 Hearing loss   . History of alcohol abuse    quit drinking in the mid 80's  . History of cirrhosis of liver    alcoholic--  hx alcohol abuse -- quit drinking 1980's  . History of gout   . History of melanoma excision    2013--  left ear lobe and back of hand  . History of pancreatitis    2008  . History of rectal cancer oncologist-  dr Benay Spice--  no recurrence   dx Sept 2009--  Stage II (T3N0)  s/p  sigmoid colectomy & low anterior resection 04-18-2008  and chemoradiation 2010  . History of shingles 07/27/2010  . History of squamous cell carcinoma excision    left lower leg  . Hyperlipidemia   . Hypertension   . Loose stools    DUE TO ANTIBIOTICS  . Macular degeneration    not sure which eye  . OA (osteoarthritis)   . RBBB (right bundle branch block)   . Renal lesion    chronic-- left side  . Sciatica of right side   . Urge urinary incontinence    intermittant   Patient Active Problem List   Diagnosis Date Noted  . Edema 11/10/2016    Priority: High  . Atrial fibrillation ([place]) 12/07/2008    Priority: High  . Chronic kidney disease (CKD), stage III (moderate) 02/15/2019    Priority: Medium  . Insomnia 10/19/2014    Priority:  Medium  . Gout 01/09/2014    Priority: Medium  . History of cancer of rectosigmoid junction 01/04/2008    Priority: Medium  . Former smoker 06/23/2007    Priority: Medium  . Hyperlipidemia, unspecified 02/09/2007    Priority: Medium  . Essential hypertension 12/07/2006    Priority: Medium  . COPD (chronic obstructive pulmonary disease) ([place]) 12/07/2006    Priority: Medium  . BPH (benign prostatic hyperplasia) 12/07/2006    Priority: Medium  . Allergic rhinitis 08/15/2013    Priority: Low  . 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 Inguinal hernia 03/21/2010    Priority: Low  . BASAL CELL CARCINOMA SKIN LOWER LIMB INCL HIP 03/21/2010    Priority: Low  . Actinic keratosis 12/07/2008    Priority: Low  . TMJ (temporomandibular joint disorder) 02/09/2007    Priority: Low  . URINARY INCONTINENCE 12/07/2006    Priority: Low  . PANCREATITIS, HX OF 12/07/2006    Priority: Low  . Aortic atherosclerosis ([place]) 01/05/2019  . Thrombocytopenia ([place]) 11/10/2016  . Encounter for therapeutic drug monitoring 08/27/2014   Past Surgical History:  Procedure Laterality Date  . CARDIAC CATHETERIZATION  2001  approx.  in [place]  . CATARACT EXTRACTION W/ INTRAOCULAR LENS  IMPLANT, BILATERAL    . COLONOSCOPY  last one 06-27-2012  . EXPLORATORY LAPAROTOMY/  LOW ANTERIOR RESECTION/  SIGMOID COLECTOMY  04-18-2008   DR Dalbert Batman  . INCISION AND DRAINAGE ABSCESS N/A 11/02/2014   Procedure: INCISION AND DRAINAGE OF SCROTUM;  Surgeon: Ardis Hughs, MD;  Location[facility];  Service: Urology;  Laterality: N/A;  . INGUINAL HERNIA REPAIR Right 03/23/2014   Procedure: HERNIA REPAIR INGUINAL ADULT OPEN REPAIR RIGHT INGUINAL HERNIA REPAIR;  Surgeon: Fanny Skates, MD;  Location: [place];  Service: General;  Laterality: Right;  . INSERTION OF MESH Right 03/23/2014   Procedure: INSERTION OF MESH;  Surgeon: Fanny Skates, MD;  Location: [place];  Service: General;  Laterality:  Right;  . LAPAROSCOPIC CHOLECYSTECTOMY  1990's  . MASS EXCISION N/A 11/02/2014   Procedure: EXCISION OF EPIDIDYMAL CYST;  Surgeon: Ardis Hughs, MD;  Location[facility];  Service: Urology;  Laterality: N/A;  . TONSILLECTOMY  as child  . TRANSTHORACIC ECHOCARDIOGRAM  05-11-2008   pseudonormal LV filling pattern,  ef 60-65%/  mild to moderate MV calcification no stenosis w/ mild to moderate regurg./  mild LAE and RAE/  mild TR    Family History  Problem Relation Age of Onset  . Hypertension Mother   . Lymphoma Father   . Lymphoma Other   . Stroke Other        1st degree relative  . Colon cancer Neg Hx   . Esophageal cancer Neg Hx   . Rectal cancer Neg Hx   . Stomach cancer Neg Hx     Medications- reviewed and updated Current Outpatient Medications  Medication Sig Dispense Refill  . allopurinol (ZYLOPRIM) 300 MG tablet TAKE ONE-HALF TABLET BY MOUTH IN THE EVENING 45 tablet 1  . amLODipine (NORVASC) 2.5 MG tablet TAKE 1 TABLET BY MOUTH EVERY DAY 90 tablet 1  . colchicine 0.6 MG tablet Take 0.6 mg by mouth 2 (two) times daily as needed (gout).    . fish oil-omega-3 fatty acids 1000 MG capsule Take 1 g by mouth daily.    . fluticasone (FLONASE) 50 MCG/ACT nasal spray Place 2 sprays into both nostrils daily. 48 g 3  . furosemide (LASIX) 20 MG tablet TAKE 1 TABLET BY MOUTH EVERY DAY AS NEEDED 90 tablet 1  . metoprolol succinate (TOPROL-XL) 25 MG 24 hr tablet Take 0.5 tablets (12.5 mg total) by mouth daily. 45 tablet 3  . Multiple Vitamin (MULTIVITAMIN) tablet Take 1 tablet by mouth daily.    . traMADol (ULTRAM) 50 MG tablet Take 1 tablet (50 mg total) by mouth every 8 (eight) hours as needed for moderate pain or severe pain (dont drive for 8 hours after taking. 1 refill per month). 20 tablet 5  . triamterene-hydrochlorothiazide (MAXZIDE-25) 37.5-25 MG tablet TAKE 1/2 TABLET BY MOUTH DAILY 45 tablet 1  . warfarin (COUMADIN) 2.5 MG tablet TAKE 1 TABLET DAILY EXCEPT  TAKE 1 1/2 TABLETS ON WED AND SAT OR TAKE AS DIRECTED BY CLINIC 40 tablet 1  . zolpidem (AMBIEN) 5 MG tablet TAKE 1/2 (ONE-HALF) TABLET BY MOUTH AT BEDTIME AS NEEDED FOR SLEEP 15 tablet 5   No current facility-administered medications for this visit.    Allergies-reviewed and updated No Known Allergies  Social History   Social History Narrative   Married. 6 kids. Wife Joycelyn Schmid also a patient of Dr. Ronney Lion.       Retired.    Objective  Objective:  BP 122/66   Pulse [name])  50   Temp 97.8 F (36.6 C)   Ht 5\' 11"  (1.803 m)   Wt 166 lb 6.4 oz (75.5 kg)   SpO2 99%   BMI 23.21 kg/m  Gen: NAD, resting comfortably HEENT: Mucous membranes are moist. Oropharynx normal- dentures in place Neck: no thyromegaly CV: RRR no murmurs rubs or gallops Lungs: CTAB no crackles, wheeze, rhonchi Abdomen: soft/nontender/nondistended/normal bowel sounds. No rebound or guarding.  Ext: trace to 1+ edema- venous stasis skin changes Skin: warm, dry Neuro: grossly normal, moves all extremities, PERRLA   Assessment and Plan  86 y.o. male presenting for annual physical.  Health Maintenance counseling: 1. Anticipatory guidance: Patient counseled regarding regular dental exams - wears dentures, eye exams yearly- more frequently due to injections,  avoiding smoking and second hand smoke , limiting alcohol to 0 beverages per day .   2. Risk factor reduction:  Advised patient of need for regular exercise and diet rich and fruits and vegetables to reduce risk of heart attack and stroke. Exercise- not too good-  Encouraged to start even minimal walking- doesn't like leaving his wife though . Diet-weight largely stable- trying to eat reasonably healthy- healthy weight Wt Readings from Last 3 Encounters:  10/05/19 166 lb 6.4 oz (75.5 kg)  06/27/19 167 lb 3.2 oz (75.8 kg)  03/30/19 169 lb 12.8 oz (77 kg)  3. Immunizations/screenings/ancillary studies Immunization History  Administered Date(s) Administered  .  Fluad Quad(high Dose 65+) 01/05/2019  . Influenza Split 01/14/2011  . Influenza, High Dose Seasonal PF 01/20/2013, 05/21/2017, 03/03/2018  . Influenza,inj,Quad PF,6+ Mos 01/09/2014  . PFIZER SARS-COV-2 Vaccination 05/04/2019, 05/22/2019  . Pneumococcal Conjugate-13 07/10/2014  . Pneumococcal Polysaccharide-23 04/20/2006  . Td 11/10/2012  . Tdap 01/09/2018  4. Prostate cancer screening- passed age based screening recommendations. Continues to have BPH issues- frequency and incontinence. Will get UA  5. Colon cancer screening - passed age based screening recommendations.  No blood in the stool or dark black stool 6. Skin cancer screening- sees dermatology. advised regular sunscreen use. Denies worrisome, changing, or new skin lesions.  7.  Former smoker-quit smoking in 2009 after cancer diagnosis.  No regular screening required other than we will get UA 8. STD screening - declines monogomous  Status of chronic or acute concerns   Lower Legs/venous insufficiency S: pt c/o lower leg discoloration.  Uses Lasix as needed for edema. Also gets some leg cramps- not worse when takes lasix. Pickle juice helps his cramps A/P: Patient has some venous insufficiency and venous stasis skin changes.  He also getting some cramping-we will check electrolytes and he will continue his as needed pickle juice. Also has lasix if swelling gets bad  # Atrial fibrillation S: Rate controlled with metoprolol 25 mg extended release-takes half tablet Anticoagulated with Coumadin through [facility] clinic Patient is  followed by cardiology: Dr. Gwenlyn Found A/P: Appropriately anticoagulated and rate controlled   #hypertension S: medication: Amlodipine 2.5 mg daily and metoprolol 12.5 mg extended release and triamterene hydrochlorothiazide 37.5-25 mg-half tablet daily.  Lasix as needed for edema Home readings #s: 130s/70s A/P: Well-controlled-continue current medication  #Chronic kidney disease stage III S: GFR is  typically in the 50s range -Patient knows to avoid NSAIDs  A/P: Hopefully controlled-update CMP with labs today  #hyperlipidemia-no medication for primary prevention S: Medication: None Lab Results  Component Value Date   CHOL 180 01/05/2019   HDL 33.70 (L) 01/05/2019   LDLCALC 111 (H) 01/05/2019   LDLDIRECT 149.3 01/06/2011   TRIG 177.0 (  H) 01/05/2019   CHOLHDL 5 01/05/2019   A/P: Patient 86 years old without history of heart attack or stroke-remain off statin and focus on healthy eating/regular exercise  #Gout S: 0 flares in 6 months (years) on allopurinol 150 mg Lab Results  Component Value Date   LABURIC 5.4 01/05/2019  A/P: Well-controlled-continue current medication and update uric acid level today  #Insomnia S: Remains on Ambien 2.5 mg- 3x a week usually if prolonged issues with falling asleep.  Other efforts such as trazodone have failed in the past. A/P: reasonable control with intermittent use- continue current meds. Doesn't feel drowsy in AM   #COPD-doing reasonably well without medication.  No worsening shortness of breath or wheezing.  Continue to monitor   #TMJD- finally doing better 09/2019   #infrarenal aneurysm slow growing- saw Dr. Donzetta Matters last year with plan for duplex in 1 year- we will go ahead and order this AAA duplex and he will call for follow up scheduling  Recommended follow up: Return in about 6 months (around 04/05/2020) for follow up- or sooner if needed. Future Appointments  Date Time Provider [place]  11/01/2019 11:00 AM [facility]  12/13/2019 10:30 AM Marzetta Board, DPM [facility]   Lab/Order associations: fasting   ICD-10-CM   1. Preventative health care  Z00.00 CBC with Differential/Platelet    Comprehensive metabolic panel    Lipid panel  2. Essential hypertension  I10 Comprehensive metabolic panel  3. Stage 3a chronic kidney disease  N18.31   4. Mixed hyperlipidemia  E78.2 Lipid panel   5. Chronic obstructive pulmonary disease, unspecified COPD type ([place])  J44.9   6. Aortic atherosclerosis (HCC) Chronic I70.0   7. Thrombocytopenia (HCC) Chronic D69.6   8. Atrial fibrillation, unspecified type ([place])  I48.91   9. Primary insomnia  F51.01   10. History of cancer of rectosigmoid junction  Z85.048   11. Former smoker  Z87.891   66. Benign prostatic hyperplasia with lower urinary tract symptoms, symptom details unspecified  N40.1   13. Chronic gout without tophus, unspecified cause, unspecified site  M1A.9XX0     No orders of the defined types were placed in this encounter.   Return precautions advised.  [name], MD

## 2019-10-06 LAB — CEA: CEA: 1.8 ng/mL

## 2019-10-18 ENCOUNTER — Other Ambulatory Visit: Payer: Self-pay | Admitting: Family Medicine

## 2019-10-18 DIAGNOSIS — Z0000 Encounter for general adult medical examination without abnormal findings: Secondary | ICD-10-CM

## 2019-10-18 DIAGNOSIS — Z5181 Encounter for therapeutic drug level monitoring: Secondary | ICD-10-CM

## 2019-10-18 DIAGNOSIS — Z87891 Personal history of nicotine dependence: Secondary | ICD-10-CM

## 2019-11-01 ENCOUNTER — Ambulatory Visit (HOSPITAL_COMMUNITY)
Admission: RE | Admit: 2019-11-01 | Discharge: 2019-11-01 | Disposition: A | Discharge status: Home / Self Care | Payer: Medicare Other | Source: Ambulatory Visit | Attending: Internal Medicine | Admitting: Internal Medicine

## 2019-11-01 ENCOUNTER — Ambulatory Visit: Payer: Medicare Other

## 2019-11-01 ENCOUNTER — Other Ambulatory Visit: Payer: Self-pay

## 2019-11-01 DIAGNOSIS — I10 Essential (primary) hypertension: Secondary | ICD-10-CM | POA: Insufficient documentation

## 2019-11-01 DIAGNOSIS — E785 Hyperlipidemia, unspecified: Secondary | ICD-10-CM | POA: Insufficient documentation

## 2019-11-01 DIAGNOSIS — Z87891 Personal history of nicotine dependence: Secondary | ICD-10-CM | POA: Diagnosis not present

## 2019-11-01 DIAGNOSIS — Z0000 Encounter for general adult medical examination without abnormal findings: Secondary | ICD-10-CM

## 2019-11-01 DIAGNOSIS — Z136 Encounter for screening for cardiovascular disorders: Secondary | ICD-10-CM | POA: Diagnosis not present

## 2019-11-14 ENCOUNTER — Other Ambulatory Visit: Payer: Self-pay | Admitting: Family Medicine

## 2019-11-15 ENCOUNTER — Ambulatory Visit (INDEPENDENT_AMBULATORY_CARE_PROVIDER_SITE_OTHER): Payer: Medicare Other | Admitting: General Practice

## 2019-11-15 ENCOUNTER — Other Ambulatory Visit: Payer: Self-pay

## 2019-11-15 DIAGNOSIS — Z5181 Encounter for therapeutic drug level monitoring: Secondary | ICD-10-CM

## 2019-11-15 LAB — POCT INR: INR: 1.8 — AB (ref 2.0–3.0)

## 2019-11-15 NOTE — Patient Instructions (Addendum)
Pre visit review using our clinic review tool, if applicable. No additional management support is needed unless otherwise documented below in the visit note.  Change dosage and take 1 tablet daily except 2 tablets on Saturdays only.  Re-check in 4 weeks.

## 2019-11-15 NOTE — Progress Notes (Signed)
I have reviewed and agree with note, evaluation, plan.   Tobey Schmelzle, MD  

## 2019-11-20 ENCOUNTER — Ambulatory Visit (INDEPENDENT_AMBULATORY_CARE_PROVIDER_SITE_OTHER): Payer: Medicare Other | Admitting: Physician Assistant

## 2019-11-20 ENCOUNTER — Other Ambulatory Visit: Payer: Self-pay

## 2019-11-20 DIAGNOSIS — I739 Peripheral vascular disease, unspecified: Secondary | ICD-10-CM | POA: Diagnosis not present

## 2019-11-20 DIAGNOSIS — I714 Abdominal aortic aneurysm, without rupture, unspecified: Secondary | ICD-10-CM | POA: Insufficient documentation

## 2019-11-20 NOTE — Progress Notes (Signed)
Established Abdominal Aortic Aneurysm   History of Present Illness   Breanna Mcdaniel is a 86 y.o. (February 15, 1926) male who presents with chief complaint: follow up for AAA.  Previous studies demonstrate an AAA, measuring 4.2 cm.  The patient does not have back or abdominal pain.  The patient is a former smoker.  Patient also complains of occasional pain in legs that only occurs at night.  There is no recognizable pattern to symptoms and this does not occur every night.  He denies claudication however admittedly does not do much walking.  He has stasis pigmentation changes but denies other discoloration of BLE.  He also denies non healing wounds.  He has varicose veins and edema but is not bothered by these.  He is on coumadin for atrial fibrillation.  The patient's PMH, PSH, SH, and FamHx were reviewed and are unchanged from prior visit.  Current Outpatient Medications  Medication Sig Dispense Refill  . allopurinol (ZYLOPRIM) 300 MG tablet TAKE ONE-HALF TABLET BY MOUTH IN THE EVENING 45 tablet 1  . amLODipine (NORVASC) 2.5 MG tablet TAKE 1 TABLET BY MOUTH EVERY DAY 90 tablet 1  . colchicine 0.6 MG tablet Take 0.6 mg by mouth 2 (two) times daily as needed (gout).    . fish oil-omega-3 fatty acids 1000 MG capsule Take 1 g by mouth daily.    . fluticasone (FLONASE) 50 MCG/ACT nasal spray Place 2 sprays into both nostrils daily. 48 g 3  . furosemide (LASIX) 20 MG tablet TAKE 1 TABLET BY MOUTH EVERY DAY AS NEEDED 90 tablet 1  . metoprolol succinate (TOPROL-XL) 25 MG 24 hr tablet Take 0.5 tablets (12.5 mg total) by mouth daily. 45 tablet 3  . metoprolol tartrate (LOPRESSOR) 25 MG tablet Take 12.5 mg by mouth 2 (two) times daily.    . Multiple Vitamin (MULTIVITAMIN) tablet Take 1 tablet by mouth daily.    [name] triamterene-hydrochlorothiazide (MAXZIDE-25) 37.5-25 MG tablet TAKE 1/2 TABLET BY MOUTH DAILY 45 tablet 1  . warfarin (COUMADIN) 2.5 MG tablet TAKE 1 TABLET DAILY EXCEPT TAKE 1 1/2 TABLETS ON WED  AND SAT OR TAKE AS DIRECTED BY CLINIC 40 tablet 3  . zolpidem (AMBIEN) 5 MG tablet TAKE 1/2 (ONE-HALF) TABLET BY MOUTH AT BEDTIME AS NEEDED FOR SLEEP 15 tablet 5   No current facility-administered medications for this visit.    REVIEW OF SYSTEMS (negative unless checked):   Cardiac:  []  Chest pain or chest pressure? []  Shortness of breath upon activity? []  Shortness of breath when lying flat? []  Irregular heart rhythm?  Vascular:  []  Pain in calf, thigh, or hip brought on by walking? []  Pain in feet at night that wakes you up from your sleep? []  Blood clot in your veins? []  Leg swelling?  Pulmonary:  []  Oxygen at home? []  Productive cough? []  Wheezing?  Neurologic:  []  Sudden weakness in arms or legs? []  Sudden numbness in arms or legs? []  Sudden onset of difficult speaking or slurred speech? []  Temporary loss of vision in one eye? []  Problems with dizziness?  Gastrointestinal:  []  Blood in stool? []  Vomited blood?  Genitourinary:  []  Burning when urinating? []  Blood in urine?  Psychiatric:  []  Major depression  Hematologic:  []  Bleeding problems? []  Problems with blood clotting?  Dermatologic:  []  Rashes or ulcers?  Constitutional:  []  Fever or chills?  Ear/Nose/Throat:  []  Change in hearing? []  Nose bleeds? []  Sore throat?  Musculoskeletal:  []  Back pain? []  Joint pain? []   Muscle pain?   Physical Examination   Vitals:   11/20/19 1339  BP: 139/78  Pulse: 74  Resp: 20  Temp: (!) 96.8 F (36 C)  TempSrc: Temporal  SpO2: 100%  Weight: 164 lb 9.6 oz (74.7 kg)  Height: 5\' 11"  (1.803 m)   Body mass index is 22.96 kg/m.  General:  WDWN in NAD; vital signs documented above Gait: Not observed HENT: WNL, normocephalic Pulmonary: normal non-labored breathing , without Rales, rhonchi,  wheezing Cardiac: irregular HR Abdomen: soft, NT, no masses Skin: without rashes Vascular Exam/Pulses:  Right Left  Radial 2+ (normal) 2+ (normal)  DP  absent absent  PT absent absent   Extremities: without ischemic changes, without Gangrene , without cellulitis; without open wounds; prominent varicose veins of bilateral shins with stasis pigmentation of bilateral lower legs; R leg with PT and peroneal signals by doppler; LLE with ATA and peroneal signals by doppler Musculoskeletal: no muscle wasting or atrophy  Neurologic: A&O X 3;  No focal weakness or paresthesias are detected Psychiatric:  The pt has Normal affect.   Non-Invasive Vascular Imaging   AAA Duplex  Current size: 4.2 cm  Previous size: 4.2 cm   R CIA: 1.3 cm  L CIA: 1.6 cm   Medical Decision Making   Fulton Merry is a 86 y.o. (29-Aug-1925) male who presents with: asymptomatic AAA   AAA size unchanged over the past year, measuring 4.2cm in largest diameter  Patient also likely has infrainguinal occlusive disease but denies rest pain and non healing wounds  Patient also with varicose veins, edema, and stasis pigmentation but not bothersome  Encouraged regular walking program and reviewed this with the patient  Recheck AAA duplex in 1 year per protocol   Dagoberto Ligas PA-C [facility] Office: [facility] MD: Trula Slade

## 2019-12-13 ENCOUNTER — Encounter: Payer: Self-pay | Admitting: Podiatry

## 2019-12-13 ENCOUNTER — Other Ambulatory Visit: Payer: Self-pay

## 2019-12-13 ENCOUNTER — Ambulatory Visit (INDEPENDENT_AMBULATORY_CARE_PROVIDER_SITE_OTHER): Payer: Medicare Other | Admitting: Podiatry

## 2019-12-13 VITALS — BP 128/79 | HR 45

## 2019-12-13 DIAGNOSIS — M79675 Pain in left toe(s): Secondary | ICD-10-CM

## 2019-12-13 DIAGNOSIS — B351 Tinea unguium: Secondary | ICD-10-CM | POA: Diagnosis not present

## 2019-12-13 DIAGNOSIS — M79674 Pain in right toe(s): Secondary | ICD-10-CM

## 2019-12-13 NOTE — Patient Instructions (Signed)
Recommend Skechers Loafers with stretchable uppers and memory foam insoles. They can be purchased at [employer], [employer] or [employer]. Also on [email or web address].   Peripheral Vascular Disease  Peripheral vascular disease (PVD) is a disease of the blood vessels that are not part of your heart and brain. A simple term for PVD is poor circulation. In most cases, PVD narrows the blood vessels that carry blood from your heart to the rest of your body. This can reduce the supply of blood to your arms, legs, and internal organs, like your stomach or kidneys. However, PVD most often affects a person's lower legs and feet. Without treatment, PVD tends to get worse. PVD can also lead to acute ischemic limb. This is when an arm or leg suddenly cannot get enough blood. This is a medical emergency. Follow these instructions at home: Lifestyle  Do not use any products that contain nicotine or tobacco, such as cigarettes and e-cigarettes. If you need help quitting, ask your doctor.  Lose weight if you are overweight. Or, stay at a healthy weight as told by your doctor.  Eat a diet that is low in fat and cholesterol. If you need help, ask your doctor.  Exercise regularly. Ask your doctor for activities that are right for you. General instructions  Take over-the-counter and prescription medicines only as told by your doctor.  Take good care of your feet: ? Wear comfortable shoes that fit well. ? Check your feet often for any cuts or sores.  Keep all follow-up visits as told by your doctor This is important. Contact a doctor if:  You have cramps in your legs when you walk.  You have leg pain when you are at rest.  You have coldness in a leg or foot.  Your skin changes.  You are unable to get or have an erection (erectile dysfunction).  You have cuts or sores on your feet that do not heal. Get help right away if:  Your arm or leg turns cold, numb, and blue.  Your arms or legs become red, warm,  swollen, painful, or numb.  You have chest pain.  You have trouble breathing.  You suddenly have weakness in your face, arm, or leg.  You become very confused or you cannot speak.  You suddenly have a very bad headache.  You suddenly cannot see. Summary  Peripheral vascular disease (PVD) is a disease of the blood vessels.  A simple term for PVD is poor circulation. Without treatment, PVD tends to get worse.  Treatment may include exercise, low fat and low cholesterol diet, and quitting smoking. This information is not intended to replace advice given to you by your health care provider. Make sure you discuss any questions you have with your health care provider. Document Revised: 03/19/2017 Document Reviewed: 05/14/2016 Elsevier Patient Education  [place].  Onychomycosis/Fungal Toenails  WHAT IS IT? An infection that lies within the keratin of your nail plate that is caused by a fungus.  WHY ME? Fungal infections affect all ages, sexes, races, and creeds.  There may be many factors that predispose you to a fungal infection such as age, coexisting medical conditions such as diabetes, or an autoimmune disease; stress, medications, fatigue, genetics, etc.  Bottom line: fungus thrives in a warm, moist environment and your shoes offer such a location.  IS IT CONTAGIOUS? Theoretically, yes.  You do not want to share shoes, nail clippers or files with someone who has fungal toenails.  Walking around barefoot in the  same room or sleeping in the same bed is unlikely to transfer the organism.  It is important to realize, however, that fungus can spread easily from one nail to the next on the same foot.  HOW DO WE TREAT THIS?  There are several ways to treat this condition.  Treatment may depend on many factors such as age, medications, pregnancy, liver and kidney conditions, etc.  It is best to ask your doctor which options are available to you.  5. No treatment.   Unlike many  other medical concerns, you can live with this condition.  However for many people this can be a painful condition and may lead to ingrown toenails or a bacterial infection.  It is recommended that you keep the nails cut short to help reduce the amount of fungal nail. 6. Topical treatment.  These range from herbal remedies to prescription strength nail lacquers.  About 40-50% effective, topicals require twice daily application for approximately 9 to 12 months or until an entirely new nail has grown out.  The most effective topicals are medical grade medications available through physicians offices. 7. Oral antifungal medications.  With an 80-90% cure rate, the most common oral medication requires 3 to 4 months of therapy and stays in your system for a year as the new nail grows out.  Oral antifungal medications do require blood work to make sure it is a safe drug for you.  A liver function panel will be performed prior to starting the medication and after the first month of treatment.  It is important to have the blood work performed to avoid any harmful side effects.  In general, this medication safe but blood work is required. 8. Laser Therapy.  This treatment is performed by applying a specialized laser to the affected nail plate.  This therapy is noninvasive, fast, and non-painful.  It is not covered by insurance and is therefore, out of pocket.  The results have been very good with a 80-95% cure rate.  The [facility] is the only practice in the area to offer this therapy. 9. Permanent Nail Avulsion.  Removing the entire nail so that a new nail will not grow back.

## 2019-12-17 NOTE — Progress Notes (Signed)
Subjective: William Mahoney presents today referred by Marin Olp, MD for complaint of painful thick toenails that are difficult to trim. Pain interferes with ambulation. Aggravating factors include wearing enclosed shoe gear. Pain is relieved with periodic professional debridement.   He states he has an AAA and cannot bend down to trim his toenails. He is followed by Drs. Peggyann Shoals in Cardiology.   He is accompanied by his wife who also has an appointment on today's visit.  Past Medical History:  Diagnosis Date  . BPH (benign prostatic hypertrophy)   . Chronic atrial fibrillation [facility])    cardiologist--   dr berry  . COPD (chronic obstructive pulmonary disease) ([place])   . Diverticulosis of colon    MODERATE LEFT SIDE  . Epididymal cyst    w/ epididymalitis  . Full dentures   . 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 Hearing loss   . History of alcohol abuse    quit drinking in the mid 80's  . History of cirrhosis of liver    alcoholic--  hx alcohol abuse -- quit drinking 1980's  . History of gout   . History of melanoma excision    2013--  left ear lobe and back of hand  . History of pancreatitis    2008  . History of rectal cancer oncologist-  dr Benay Spice--  no recurrence   dx Sept 2009--  Stage II (T3N0)  s/p  sigmoid colectomy & low anterior resection 04-18-2008  and chemoradiation 2010  . History of shingles 07/27/2010  . History of squamous cell carcinoma excision    left lower leg  . Hyperlipidemia   . Hypertension   . Loose stools    DUE TO ANTIBIOTICS  . Macular degeneration    not sure which eye  . OA (osteoarthritis)   . RBBB (right bundle branch block)   . Renal lesion    chronic-- left side  . Sciatica of right side   . Urge urinary incontinence    intermittant     Patient Active Problem List   Diagnosis Date Noted  . AAA (abdominal aortic aneurysm) without rupture ([place]) 11/20/2019  . PAD (peripheral  artery disease) ([place]) 11/20/2019  . Chronic kidney disease (CKD), stage III (moderate) 02/15/2019  . Aortic atherosclerosis ([place]) 01/05/2019  . Thrombocytopenia ([place]) 11/10/2016  . Edema 11/10/2016  . Insomnia 10/19/2014  . Encounter for therapeutic drug monitoring 08/27/2014  . Gout 01/09/2014  . Allergic rhinitis 08/15/2013  . Arthritis of lumbar spine 01/20/2013  . Inguinal hernia 03/21/2010  . BASAL CELL CARCINOMA SKIN LOWER LIMB INCL HIP 03/21/2010  . Atrial fibrillation ([place]) 12/07/2008  . Actinic keratosis 12/07/2008  . History of cancer of rectosigmoid junction 01/04/2008  . Former smoker 06/23/2007  . Hyperlipidemia, unspecified 02/09/2007  . TMJ (temporomandibular joint disorder) 02/09/2007  . Essential hypertension 12/07/2006  . COPD (chronic obstructive pulmonary disease) ([place]) 12/07/2006  . BPH (benign prostatic hyperplasia) 12/07/2006  . URINARY INCONTINENCE 12/07/2006  . PANCREATITIS, HX OF 12/07/2006     Past Surgical History:  Procedure Laterality Date  . CARDIAC CATHETERIZATION  2001  approx.  in [place]  . CATARACT EXTRACTION W/ INTRAOCULAR LENS  IMPLANT, BILATERAL    . COLONOSCOPY  last one 06-27-2012  . EXPLORATORY LAPAROTOMY/  LOW ANTERIOR RESECTION/  SIGMOID COLECTOMY  04-18-2008   DR Dalbert Batman  . INCISION AND DRAINAGE ABSCESS N/A 11/02/2014   Procedure: INCISION AND DRAINAGE OF SCROTUM;  Surgeon: Ardis Hughs, MD;  Location: [facility];  Service: Urology;  Laterality: N/A;  . INGUINAL HERNIA REPAIR Right 03/23/2014   Procedure: HERNIA REPAIR INGUINAL ADULT OPEN REPAIR RIGHT INGUINAL HERNIA REPAIR;  Surgeon: Fanny Skates, MD;  Location: [facility];  Service: General;  Laterality: Right;  . INSERTION OF MESH Right 03/23/2014   Procedure: INSERTION OF MESH;  Surgeon: Fanny Skates, MD;  Location: [place];  Service: General;  Laterality: Right;  . LAPAROSCOPIC CHOLECYSTECTOMY  1990's  . MASS EXCISION N/A 11/02/2014   Procedure: EXCISION OF  EPIDIDYMAL CYST;  Surgeon: Ardis Hughs, MD;  Location: [facility];  Service: Urology;  Laterality: N/A;  . TONSILLECTOMY  as child  . TRANSTHORACIC ECHOCARDIOGRAM  05-11-2008   pseudonormal LV filling pattern,  ef 60-65%/  mild to moderate MV calcification no stenosis w/ mild to moderate regurg./  mild LAE and RAE/  mild TR     Current Outpatient Medications on File Prior to Visit  Medication Sig Dispense Refill  . allopurinol (ZYLOPRIM) 300 MG tablet TAKE ONE-HALF TABLET BY MOUTH IN THE EVENING 45 tablet 1  . amLODipine (NORVASC) 2.5 MG tablet TAKE 1 TABLET BY MOUTH EVERY DAY 90 tablet 1  . colchicine 0.6 MG tablet Take 0.6 mg by mouth 2 (two) times daily as needed (gout).    . fish oil-omega-3 fatty acids 1000 MG capsule Take 1 g by mouth daily.    . fluticasone (FLONASE) 50 MCG/ACT nasal spray Place 2 sprays into both nostrils daily. 48 g 3  . furosemide (LASIX) 20 MG tablet TAKE 1 TABLET BY MOUTH EVERY DAY AS NEEDED 90 tablet 1  . metoprolol succinate (TOPROL-XL) 25 MG 24 hr tablet Take 0.5 tablets (12.5 mg total) by mouth daily. 45 tablet 3  . metoprolol tartrate (LOPRESSOR) 25 MG tablet Take 12.5 mg by mouth 2 (two) times daily.    . Multiple Vitamin (MULTIVITAMIN) tablet Take 1 tablet by mouth daily.    [name] triamterene-hydrochlorothiazide (MAXZIDE-25) 37.5-25 MG tablet TAKE 1/2 TABLET BY MOUTH DAILY 45 tablet 1  . warfarin (COUMADIN) 2.5 MG tablet TAKE 1 TABLET DAILY EXCEPT TAKE 1 1/2 TABLETS ON WED AND SAT OR TAKE AS DIRECTED BY CLINIC 40 tablet 3  . zolpidem (AMBIEN) 5 MG tablet TAKE 1/2 (ONE-HALF) TABLET BY MOUTH AT BEDTIME AS NEEDED FOR SLEEP 15 tablet 5   No current facility-administered medications on file prior to visit.     No Known Allergies   Social History   Occupational History  . Occupation: Retired    Fish farm manager: RETIRED  Tobacco Use  . Smoking status: Former Smoker    Packs/day: 1.00    Years: 55.00    Pack years: 55.00    Types: Cigarettes     Quit date: 03/20/2008    Years since quitting: 11.7  . Smokeless tobacco: Never Used  Vaping Use  . Vaping Use: Never used  Substance and Sexual Activity  . Alcohol use: No    Comment: hx alcohol abuse -- quit in 1980's   . Drug use: No  . Sexual activity: Not on file     Family History  Problem Relation Age of Onset  . Hypertension Mother   . Lymphoma Father   . Lymphoma Other   . Stroke Other        1st degree relative  . Colon cancer Neg Hx   . Esophageal cancer Neg Hx   . Rectal cancer Neg Hx   .  Stomach cancer Neg Hx      Immunization History  Administered Date(s) Administered  . Fluad Quad(high Dose 65+) 01/05/2019  . Influenza Split 01/14/2011  . Influenza, High Dose Seasonal PF 01/20/2013, 05/21/2017, 03/03/2018  . Influenza,inj,Quad PF,6+ Mos 01/09/2014  . PFIZER SARS-COV-2 Vaccination 05/04/2019, 05/22/2019  . Pneumococcal Conjugate-13 07/10/2014  . Pneumococcal Polysaccharide-23 04/20/2006  . Td 11/10/2012  . Tdap 01/09/2018     Objective: William Mahoney is a pleasant 86 y.o. male WD, WN in NAD.[name] AAO x 3.  Vitals:   12/13/19 1031  BP: 128/79  Pulse: (!) 45    Vascular Examination:  Capillary refill time to digits immediate b/l. Palpable DP pulse(s) b/l lower extremities Faintly palpable PT pulse(s) b/l lower extremities. Pedal hair absent. Lower extremity skin temperature gradient within normal limits. No pain with calf compression b/l. No edema noted b/l lower extremities.  Dermatological Examination: Pedal skin is thin shiny, atrophic b/l lower extremities. No open wounds bilaterally. No interdigital macerations bilaterally. Toenails 1-5 b/l elongated, discolored, dystrophic, thickened, crumbly with subungual debris and tenderness to dorsal palpation.  Musculoskeletal: Normal muscle strength 5/5 to all lower extremity muscle groups bilaterally. No pain crepitus or joint limitation noted with ROM b/l. Patient ambulates independent of any assistive  aids.  Neurological: Protective sensation intact 5/5 intact bilaterally with 10g monofilament b/l. Vibratory sensation intact b/l. Proprioception intact bilaterally. Babinski reflex negative b/l. Clonus negative b/l.  Assessment: 1. Pain due to onychomycosis of toenails of both feet     Plan: -Examined patient. -Toenails 1-5 b/l were debrided in length and girth with sterile nail nippers and dremel without iatrogenic bleeding.  -Patient to report any pedal injuries to medical professional immediately. -Patient to continue soft, supportive shoe gear daily. -Patient/POA to call should there be question/concern in the interim.  Return in about 3 months (around 03/14/2020) for nail trim.

## 2019-12-18 ENCOUNTER — Other Ambulatory Visit: Payer: Self-pay | Admitting: Family Medicine

## 2019-12-20 ENCOUNTER — Other Ambulatory Visit: Payer: Self-pay

## 2019-12-20 ENCOUNTER — Ambulatory Visit (INDEPENDENT_AMBULATORY_CARE_PROVIDER_SITE_OTHER): Payer: Medicare Other | Admitting: General Practice

## 2019-12-20 DIAGNOSIS — Z5181 Encounter for therapeutic drug level monitoring: Secondary | ICD-10-CM

## 2019-12-20 LAB — POCT INR: INR: 2.7 (ref 2.0–3.0)

## 2019-12-20 NOTE — Progress Notes (Signed)
I have reviewed and agree with note, evaluation, plan.   Peggye Poon, MD  

## 2019-12-20 NOTE — Patient Instructions (Signed)
Pre visit review using our clinic review tool, if applicable. No additional management support is needed unless otherwise documented below in the visit note.  Continue to take 1 tablet daily except 2 tablets on Saturdays only.  Re-check in 4 to 5 weeks.

## 2019-12-27 IMAGING — CT CT HEAD W/O CM
3 series · 15 of 47 positions shown, 18 images · non-contrast
Comparison: 01/10/2011

CLINICAL DATA: Fell from a 3 foot stepladder. Patient hit is head
with approximately 2 a skin tear to the left arm. Patient on
Coumadin.

EXAM:
CT HEAD WITHOUT CONTRAST
TECHNIQUE: Contiguous axial images were obtained from the base of the skull
through the vertex without intravenous contrast.

[Series 3: head 5.0 h30s · axial · 0.42mm/px · z∈[-86,+44]mm · 9 of 32 slices shown, 12 images]
[im 3/32  brain]
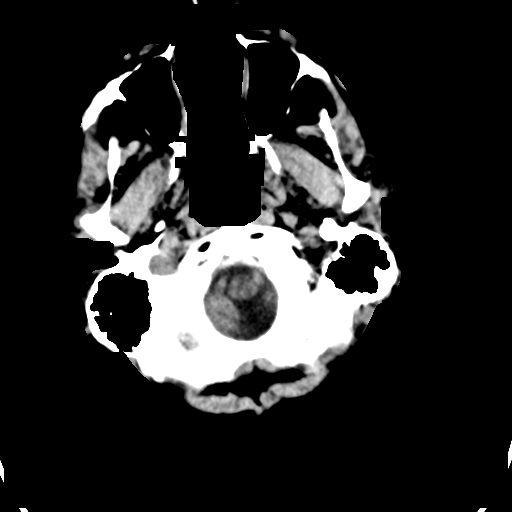
[im 3/32  bone]
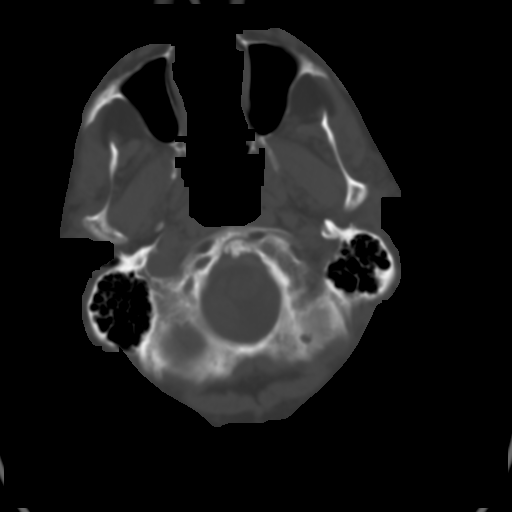
[im 6/32  brain]
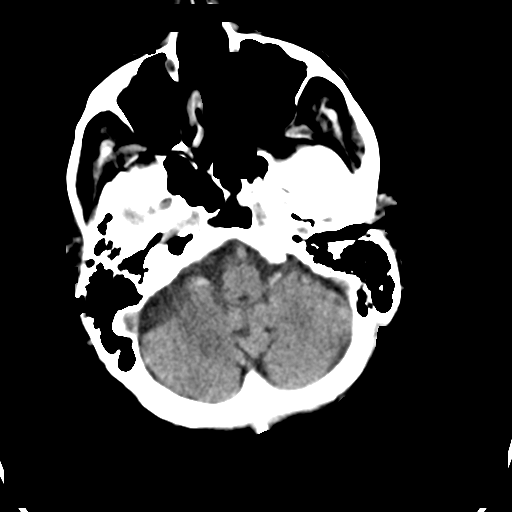
[im 9/32  brain]
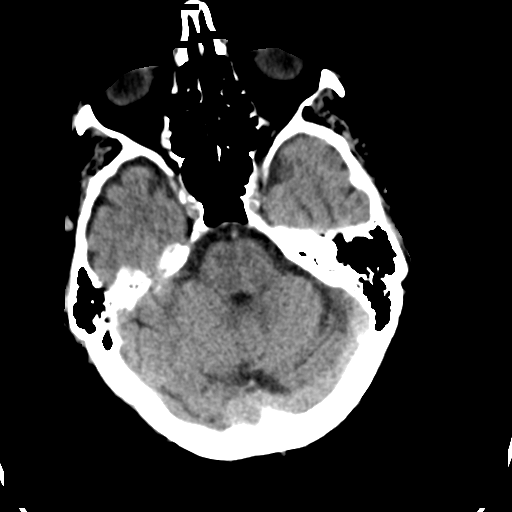
[im 12/32  brain]
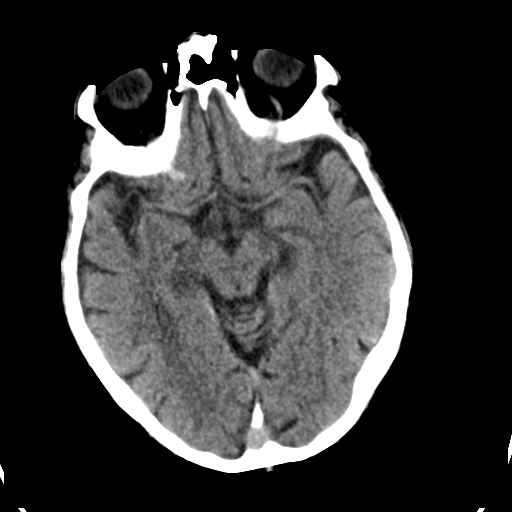
[im 17/32  brain]
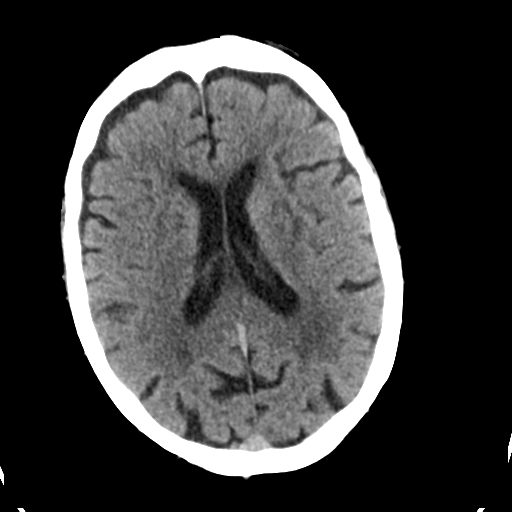
[im 17/32  bone]
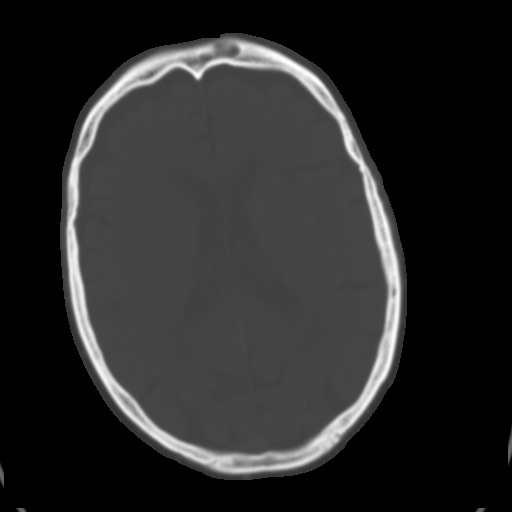
[im 20/32  brain]
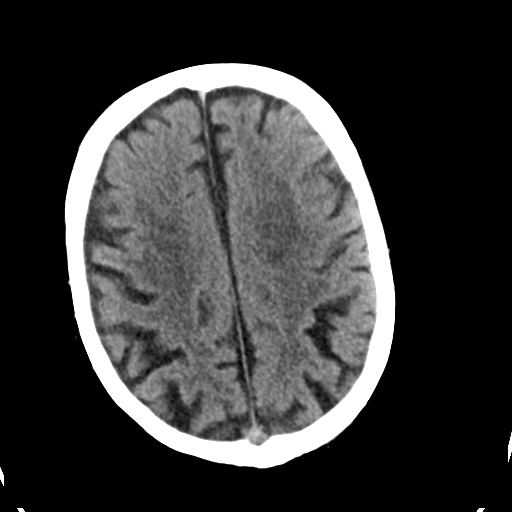
[im 23/32  brain]
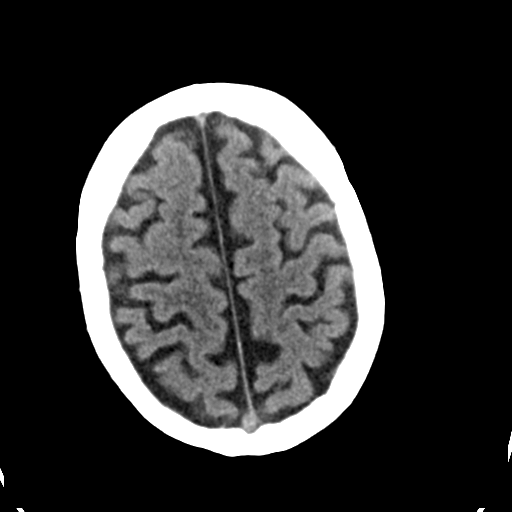
[im 26/32  brain]
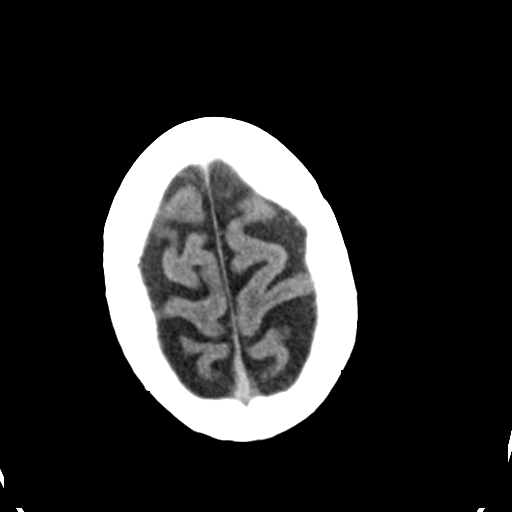
[im 29/32  brain]
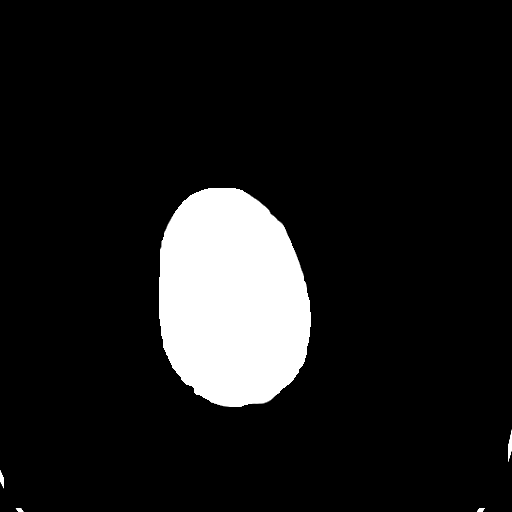
[im 29/32  bone]
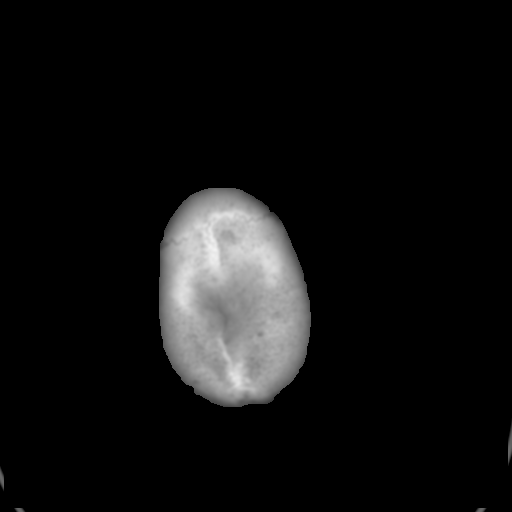

[Series 5: head 3.0 mpr cor · coronal · 0.31mm/px · 3 of 67 slices shown]
[im 23/67  brain]
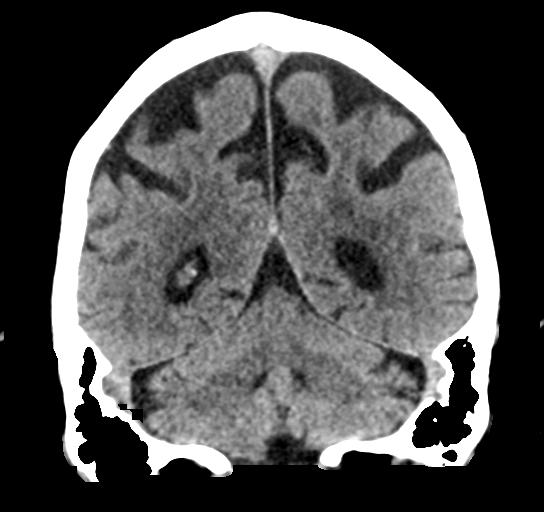
[im 30/67  brain]
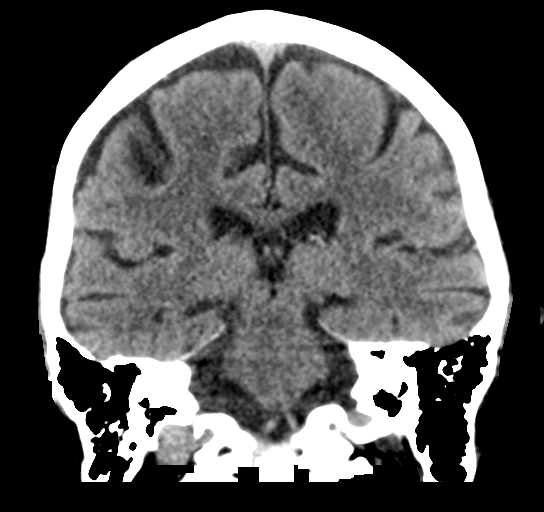
[im 37/67  brain]
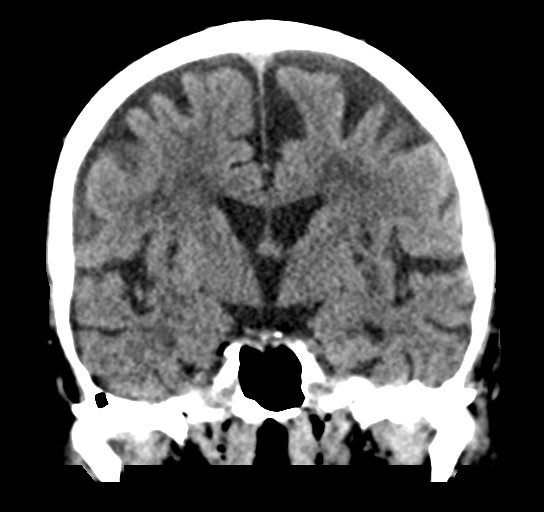

[Series 6: head 3.0 mpr sag · sagittal · 0.31mm/px · 3 of 67 slices shown]
[im 23/67  brain]
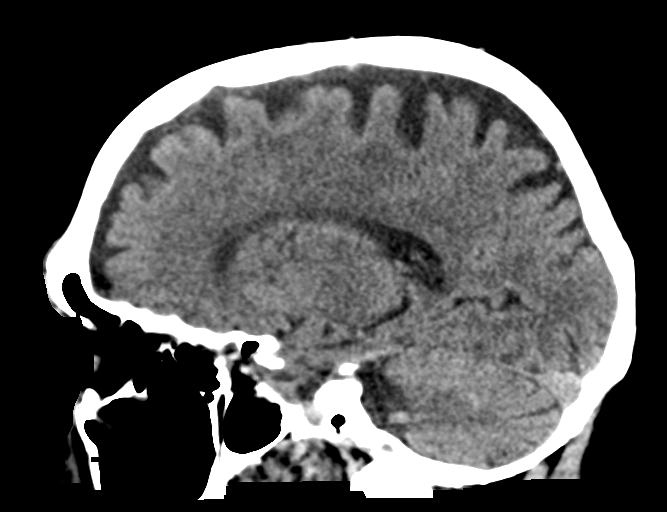
[im 34/67  brain]
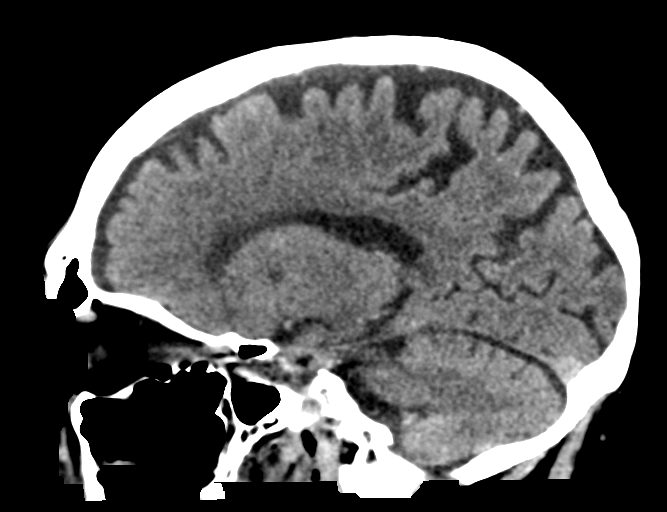
[im 45/67  brain]
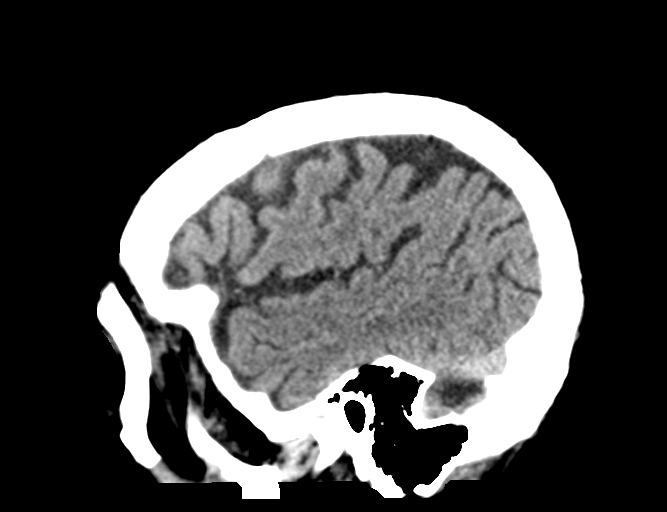

[15 of 47 positions shown; findings below may reference images not displayed]

FINDINGS: Brain: No evidence of acute infarction, hemorrhage, hydrocephalus,
extra-axial collection or mass lesion/mass effect.

Patchy white matter hypoattenuation is noted consistent with mild to
moderate chronic microvascular ischemic change. Small old lacunar
infarcts are suggested in the basal ganglia.

Vascular: No hyperdense vessel or unexpected calcification.

Skull: Normal. Negative for fracture or focal lesion.

Sinuses/Orbits: Globes and orbits are unremarkable. Sinuses and
mastoid air cells are clear.

Other: None.
IMPRESSION: 1. No acute intracranial abnormalities.
2. Chronic microvascular ischemic change. Small old basal ganglia
lacune infarcts.

## 2019-12-28 ENCOUNTER — Ambulatory Visit: Payer: Medicare Other

## 2019-12-29 ENCOUNTER — Encounter: Payer: Self-pay | Admitting: Family Medicine

## 2019-12-29 ENCOUNTER — Other Ambulatory Visit: Payer: Self-pay

## 2019-12-29 ENCOUNTER — Other Ambulatory Visit (INDEPENDENT_AMBULATORY_CARE_PROVIDER_SITE_OTHER): Payer: Medicare Other

## 2019-12-29 DIAGNOSIS — Z23 Encounter for immunization: Secondary | ICD-10-CM | POA: Diagnosis not present

## 2020-01-04 ENCOUNTER — Ambulatory Visit: Payer: Medicare Other

## 2020-01-20 ENCOUNTER — Other Ambulatory Visit: Payer: Self-pay | Admitting: Family Medicine

## 2020-01-22 ENCOUNTER — Other Ambulatory Visit: Payer: Self-pay

## 2020-01-22 MED ORDER — ZOLPIDEM TARTRATE 5 MG PO TABS: 5.0000 mg | ORAL_TABLET | Freq: Every evening | ORAL | 5 refills | Status: DC | PRN

## 2020-01-22 NOTE — Telephone Encounter (Signed)
..   LAST APPOINTMENT DATE: 01/20/2020   NEXT APPOINTMENT DATE:@10 /09/2019  MEDICATION:zolpidem (AMBIEN) 5 MG tablet

## 2020-01-22 NOTE — Telephone Encounter (Signed)
LR: 07-10-2019 Qty: 15 with 5 refills Last office visit: 10-05-2019 Upcoming appointment: 04-05-2020

## 2020-01-24 ENCOUNTER — Other Ambulatory Visit: Payer: Self-pay

## 2020-01-24 ENCOUNTER — Ambulatory Visit (INDEPENDENT_AMBULATORY_CARE_PROVIDER_SITE_OTHER): Payer: Medicare Other | Admitting: General Practice

## 2020-01-24 DIAGNOSIS — Z5181 Encounter for therapeutic drug level monitoring: Secondary | ICD-10-CM | POA: Diagnosis not present

## 2020-01-24 LAB — POCT INR: INR: 2.6 (ref 2.0–3.0)

## 2020-01-24 NOTE — Progress Notes (Signed)
I have reviewed and agree with note, evaluation, plan.   Nirav Sweda, MD  

## 2020-01-24 NOTE — Patient Instructions (Signed)
Pre visit review using our clinic review tool, if applicable. No additional management support is needed unless otherwise documented below in the visit note.  Continue to take 1 tablet daily except 2 tablets on Saturdays only.  Re-check in 6 weeks. 

## 2020-02-05 ENCOUNTER — Telehealth: Payer: Self-pay

## 2020-02-05 NOTE — Telephone Encounter (Signed)
Please be on the look out for form and place in Dr. Yong Channel folder once received,thanks [place].

## 2020-02-05 NOTE — Telephone Encounter (Signed)
Patient stated there will be a form from his dentist office to be filled out for a surgery

## 2020-02-07 NOTE — Telephone Encounter (Signed)
Noted  

## 2020-02-10 ENCOUNTER — Other Ambulatory Visit: Payer: Self-pay | Admitting: Family Medicine

## 2020-02-16 ENCOUNTER — Other Ambulatory Visit: Payer: Self-pay | Admitting: Family Medicine

## 2020-02-26 ENCOUNTER — Telehealth: Payer: Self-pay

## 2020-02-26 DIAGNOSIS — Z5181 Encounter for therapeutic drug level monitoring: Secondary | ICD-10-CM

## 2020-02-26 MED ORDER — WARFARIN SODIUM 2.5 MG PO TABS: ORAL_TABLET | ORAL | 3 refills | Status: DC

## 2020-02-26 NOTE — Telephone Encounter (Signed)
Refill sent.

## 2020-02-26 NOTE — Telephone Encounter (Signed)
°  LAST APPOINTMENT DATE: 02/16/2020   NEXT APPOINTMENT DATE:@11 /17/2021  MEDICATION:warfarin (COUMADIN) 2.5 MG tablet   PHARMACY: [id number] [place],  - [address]  [place]  Please advise

## 2020-03-06 ENCOUNTER — Other Ambulatory Visit: Payer: Self-pay

## 2020-03-06 ENCOUNTER — Ambulatory Visit (INDEPENDENT_AMBULATORY_CARE_PROVIDER_SITE_OTHER): Payer: Medicare Other | Admitting: General Practice

## 2020-03-06 DIAGNOSIS — I4891 Unspecified atrial fibrillation: Secondary | ICD-10-CM

## 2020-03-06 DIAGNOSIS — Z5181 Encounter for therapeutic drug level monitoring: Secondary | ICD-10-CM | POA: Diagnosis not present

## 2020-03-06 LAB — POCT INR: INR: 2.6 (ref 2.0–3.0)

## 2020-03-06 NOTE — Progress Notes (Signed)
I have reviewed and agree with note, evaluation, plan.   Dawnelle Warman, MD  

## 2020-03-06 NOTE — Patient Instructions (Addendum)
Pre visit review using our clinic review tool, if applicable. No additional management support is needed unless otherwise documented below in the visit note.  Continue to take 1 tablet daily except 2 tablets on Saturdays only.  Re-check in 6 weeks.

## 2020-03-17 ENCOUNTER — Other Ambulatory Visit: Payer: Self-pay | Admitting: Family Medicine

## 2020-03-27 ENCOUNTER — Other Ambulatory Visit: Payer: Self-pay

## 2020-03-27 ENCOUNTER — Encounter: Payer: Self-pay | Admitting: Podiatry

## 2020-03-27 ENCOUNTER — Ambulatory Visit (INDEPENDENT_AMBULATORY_CARE_PROVIDER_SITE_OTHER): Payer: Medicare Other | Admitting: Podiatry

## 2020-03-27 DIAGNOSIS — B351 Tinea unguium: Secondary | ICD-10-CM | POA: Diagnosis not present

## 2020-03-27 DIAGNOSIS — M79674 Pain in right toe(s): Secondary | ICD-10-CM | POA: Diagnosis not present

## 2020-03-27 DIAGNOSIS — M79675 Pain in left toe(s): Secondary | ICD-10-CM

## 2020-03-31 NOTE — Progress Notes (Signed)
Subjective: William Mahoney is a pleasant 86 y.o. male patient seen today painful thick toenails that are difficult to trim. Pain interferes with ambulation. Aggravating factors include wearing enclosed shoe gear. Pain is relieved with periodic professional debridement.  Past Medical History:  Diagnosis Date  . BPH (benign prostatic hypertrophy)   . Chronic atrial fibrillation [facility])    cardiologist--   dr berry  . COPD (chronic obstructive pulmonary disease) ([place])   . Diverticulosis of colon    MODERATE LEFT SIDE  . Epididymal cyst    w/ epididymalitis  . Full dentures   . 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 Hearing loss   . History of alcohol abuse    quit drinking in the mid 80's  . History of cirrhosis of liver    alcoholic--  hx alcohol abuse -- quit drinking 1980's  . History of gout   . History of melanoma excision    2013--  left ear lobe and back of hand  . History of pancreatitis    2008  . History of rectal cancer oncologist-  dr Benay Spice--  no recurrence   dx Sept 2009--  Stage II (T3N0)  s/p  sigmoid colectomy & low anterior resection 04-18-2008  and chemoradiation 2010  . History of shingles 07/27/2010  . History of squamous cell carcinoma excision    left lower leg  . Hyperlipidemia   . Hypertension   . Loose stools    DUE TO ANTIBIOTICS  . Macular degeneration    not sure which eye  . OA (osteoarthritis)   . RBBB (right bundle branch block)   . Renal lesion    chronic-- left side  . Sciatica of right side   . Urge urinary incontinence    intermittant    Patient Active Problem List   Diagnosis Date Noted  . AAA (abdominal aortic aneurysm) without rupture ([place]) 11/20/2019  . PAD (peripheral artery disease) ([place]) 11/20/2019  . Chronic kidney disease (CKD), stage III (moderate) ([place]) 02/15/2019  . Aortic atherosclerosis ([place]) 01/05/2019  . Thrombocytopenia ([place]) 11/10/2016  . Edema 11/10/2016  . Insomnia  10/19/2014  . Encounter for therapeutic drug monitoring 08/27/2014  . Gout 01/09/2014  . Allergic rhinitis 08/15/2013  . Arthritis of lumbar spine 01/20/2013  . Inguinal hernia 03/21/2010  . BASAL CELL CARCINOMA SKIN LOWER LIMB INCL HIP 03/21/2010  . Atrial fibrillation ([place]) 12/07/2008  . Actinic keratosis 12/07/2008  . History of cancer of rectosigmoid junction 01/04/2008  . Former smoker 06/23/2007  . Hyperlipidemia, unspecified 02/09/2007  . TMJ (temporomandibular joint disorder) 02/09/2007  . Essential hypertension 12/07/2006  . COPD (chronic obstructive pulmonary disease) ([place]) 12/07/2006  . BPH (benign prostatic hyperplasia) 12/07/2006  . URINARY INCONTINENCE 12/07/2006  . PANCREATITIS, HX OF 12/07/2006    Current Outpatient Medications on File Prior to Visit  Medication Sig Dispense Refill  . allopurinol (ZYLOPRIM) 300 MG tablet TAKE ONE-HALF TABLET BY MOUTH IN THE EVENING 45 tablet 1  . amLODipine (NORVASC) 2.5 MG tablet TAKE 1 TABLET BY MOUTH EVERY DAY 90 tablet 1  . colchicine 0.6 MG tablet Take 0.6 mg by mouth 2 (two) times daily as needed (gout).    . fish oil-omega-3 fatty acids 1000 MG capsule Take 1 g by mouth daily.    . fluticasone (FLONASE) 50 MCG/ACT nasal spray Place 2 sprays into both nostrils daily. 48 g 3  . furosemide (LASIX) 20 MG tablet TAKE 1 TABLET BY MOUTH  EVERY DAY AS NEEDED 90 tablet 1  . metoprolol succinate (TOPROL-XL) 25 MG 24 hr tablet Take 0.5 tablets (12.5 mg total) by mouth daily. 45 tablet 3  . metoprolol tartrate (LOPRESSOR) 25 MG tablet TAKE 1/2 TABLET BY MOUTH TWICE A DAY 90 tablet 1  . Multiple Vitamin (MULTIVITAMIN) tablet Take 1 tablet by mouth daily.    [name] triamterene-hydrochlorothiazide (MAXZIDE-25) 37.5-25 MG tablet TAKE 1/2 TABLET BY MOUTH DAILY 45 tablet 1  . warfarin (COUMADIN) 2.5 MG tablet TAKE 1 TABLET DAILY EXCEPT TAKE 1 1/2 TABLETS ON WED AND SAT OR TAKE AS DIRECTED BY CLINIC 40 tablet 3  . zolpidem (AMBIEN) 5 MG tablet Take 1  tablet (5 mg total) by mouth at bedtime as needed for sleep. 15 tablet 5   No current facility-administered medications on file prior to visit.    No Known Allergies  Objective: Physical Exam  General: William Mahoney is a pleasant 86 y.o. Caucasian male, WD, WN in NAD. AAO x 3.   Vascular:  Capillary refill time to digits immediate b/l. Palpable pedal pulses b/l LE. Pedal hair absent b/l lower extremities. Lower extremity skin temperature gradient within normal limits. No pain with calf compression b/l. No edema noted b/l lower extremities.   Dermatological:  Pedal skin with . No open wounds bilaterally. No interdigital macerations bilaterally. Toenails 1-5 b/l elongated, discolored, dystrophic, thickened, crumbly with subungual debris and tenderness to dorsal palpation. No hyperkeratotic nor porokeratotic lesions present on today's visit.  Musculoskeletal:  Normal muscle strength 5/5 to all lower extremity muscle groups bilaterally. No pain crepitus or joint limitation noted with ROM b/l. No gross bony deformities bilaterally. Patient ambulates independent of any assistive aids.  Neurological:  Protective sensation intact 5/5 intact bilaterally with 10g monofilament b/l. Vibratory sensation intact b/l. Clonus negative b/l.  Assessment and Plan:  1. Pain due to onychomycosis of toenails of both feet     -Examined patient. -No new findings. No new orders. -Toenails 1-5 b/l were debrided in length and girth with sterile nail nippers and dremel without iatrogenic bleeding.  -Patient to report any pedal injuries to medical professional immediately. -Patient to continue soft, supportive shoe gear daily. -Patient/POA to call should there be question/concern in the interim.  Return in about 3 months (around 06/25/2020).  Marzetta Board, DPM

## 2020-04-04 NOTE — Patient Instructions (Addendum)
Health Maintenance Due  Topic Date Due  . COVID-19 Vaccine (3 - Pfizer risk 4-dose series)  Would recommend getting covid 19 booster 06/19/2019   Please stop by lab before you go If you have mychart- we will send your results within 3 business days of [place] receiving them.  If you do not have mychart- we will call you about results within 5 business days of [place] receiving them.  *please note we are currently using [facility] which has a longer processing time than [place] typically so labs may not come back as quickly as in the past *please also note that you will see labs on mychart as soon as they post. I will later go in and write notes on them- will say "notes from Dr. Yong Channel"  Retry flomax but be very careful first day or two when you take it as can cause lightheadedness with standing and we cannot afford to have you falling ! Be extra careful for me

## 2020-04-04 NOTE — Progress Notes (Signed)
Phone [phone] In person visit   Subjective:   William Mahoney is a 86 y.o. year old very pleasant male patient who presents for/with See problem oriented charting Chief Complaint  Patient presents with  . COPD  . Chronic Kidney Disease  . Hypertension   This visit occurred during the SARS-CoV-2 public health emergency.  Safety protocols were in place, including screening questions prior to the visit, additional usage of staff PPE, and extensive cleaning of exam room while observing appropriate contact time as indicated for disinfecting solutions.   Past Medical History-  Patient Active Problem List   Diagnosis Date Noted  . Edema 11/10/2016    Priority: High  . Atrial fibrillation ([place]) 12/07/2008    Priority: High  . Chronic kidney disease (CKD), stage III (moderate) ([place]) 02/15/2019    Priority: Medium  . Insomnia 10/19/2014    Priority: Medium  . Gout 01/09/2014    Priority: Medium  . History of cancer of rectosigmoid junction 01/04/2008    Priority: Medium  . Former smoker 06/23/2007    Priority: Medium  . Hyperlipidemia, unspecified 02/09/2007    Priority: Medium  . Essential hypertension 12/07/2006    Priority: Medium  . COPD (chronic obstructive pulmonary disease) ([place]) 12/07/2006    Priority: Medium  . BPH (benign prostatic hyperplasia) 12/07/2006    Priority: Medium  . Allergic rhinitis 08/15/2013    Priority: Low  . 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 Inguinal hernia 03/21/2010    Priority: Low  . BASAL CELL CARCINOMA SKIN LOWER LIMB INCL HIP 03/21/2010    Priority: Low  . Actinic keratosis 12/07/2008    Priority: Low  . TMJ (temporomandibular joint disorder) 02/09/2007    Priority: Low  . URINARY INCONTINENCE 12/07/2006    Priority: Low  . PANCREATITIS, HX OF 12/07/2006    Priority: Low  . AAA (abdominal aortic aneurysm) without rupture ([place]) 11/20/2019  . PAD (peripheral artery disease) ([place]) 11/20/2019  . Aortic  atherosclerosis ([place]) 01/05/2019  . Thrombocytopenia ([place]) 11/10/2016  . Encounter for therapeutic drug monitoring 08/27/2014    Medications- reviewed and updated Current Outpatient Medications  Medication Sig Dispense Refill  . allopurinol (ZYLOPRIM) 300 MG tablet TAKE ONE-HALF TABLET BY MOUTH IN THE EVENING 45 tablet 1  . amLODipine (NORVASC) 2.5 MG tablet TAKE 1 TABLET BY MOUTH EVERY DAY 90 tablet 1  . colchicine 0.6 MG tablet Take 0.6 mg by mouth 2 (two) times daily as needed (gout).    . furosemide (LASIX) 20 MG tablet TAKE 1 TABLET BY MOUTH EVERY DAY AS NEEDED 90 tablet 1  . metoprolol tartrate (LOPRESSOR) 25 MG tablet TAKE 1/2 TABLET BY MOUTH TWICE A DAY 90 tablet 1  . Multiple Vitamin (MULTIVITAMIN) tablet Take 1 tablet by mouth daily.    [name] triamterene-hydrochlorothiazide (MAXZIDE-25) 37.5-25 MG tablet TAKE 1/2 TABLET BY MOUTH DAILY 45 tablet 1  . warfarin (COUMADIN) 2.5 MG tablet TAKE 1 TABLET DAILY EXCEPT TAKE 1 1/2 TABLETS ON WED AND SAT OR TAKE AS DIRECTED BY CLINIC 40 tablet 3  . zolpidem (AMBIEN) 5 MG tablet Take 1 tablet (5 mg total) by mouth at bedtime as needed for sleep. 15 tablet 5  . fish oil-omega-3 fatty acids 1000 MG capsule Take 1 g by mouth daily. (Patient not taking: Reported on 04/05/2020)    . fluticasone (FLONASE) 50 MCG/ACT nasal spray Place 2 sprays into both nostrils daily. (Patient not taking: Reported on 04/05/2020) 48 g 3  . metoprolol  succinate (TOPROL-XL) 25 MG 24 hr tablet Take 0.5 tablets (12.5 mg total) by mouth daily.- he reports BID 45 tablet 3   No current facility-administered medications for this visit.     Objective:  BP 133/82   Pulse (!) 54   Temp 97.6 F (36.4 C) (Temporal)   Resp 18   Ht 5\' 11"  (1.803 m)   Wt 162 lb 9.6 oz (73.8 kg)   SpO2 98%   BMI 22.68 kg/m  Gen: NAD, resting comfortably CV: irregularly irregular and bradycardic no murmurs rubs or gallops Lungs: CTAB no crackles, wheeze, rhonchi Ext: no edema Skin: warm,  dry    Assessment and Plan   # eye injections- he is doing reasonably well with this- has appointment in a week or two- not sure how much this has helped  # Atrial fibrillation S: Rate controlled with metoprolol 25 mg ER- takes a half tablet twice daily per report Anticoagulated with coumadin through [place] Patient is  followed by cardiology: Dr. Gwenlyn Found  A/P: Stable. Continue current medications.   #hyperlipidemia S: Medication: fish oil 1000Mg  Lab Results  Component Value Date   CHOL 182 10/05/2019   HDL 32.80 (L) 10/05/2019   LDLCALC 114 (H) 10/05/2019   LDLDIRECT 149.3 01/06/2011   TRIG 172.0 (H) 10/05/2019   CHOLHDL 6 10/05/2019   A/P: mild poor contorl but will hold off on meds for primary prevention  #Chronic kidney disease stage III S: GFR is typically in the 50s range -Patient knows to avoid NSAIDs  A/P: hopefuly controlled- cmp planned today   #Gout S: 0 flares in 6 months on allopurinol 300Mg , colchicine 0.6Mg  on hand Lab Results  Component Value Date   LABURIC 5.8 10/05/2019  A/P:Stable. Continue current medications. Uric acid at goal- check next visit   #hypertension S: medication: Maxzide 37.5-25Mg ,  amlodipine 2.5Mg , metoprolol 12.5 mg XR BID BP Readings from Last 3 Encounters:  04/05/20 133/82  12/13/19 128/79  11/20/19 139/78  A/P: Stable. Continue current medications.   # insomnia S:ambien 2.5mg   Now up to 4-5 days a week.  -failed trazodone in past.  A/P: good control and fortunately no falls- will continue current meds   # COPD- stable without meds- monitor  #infrarenal aortic aneurysm- stable 11/01/19 and monitor yearly "Abdominal Aorta: There is evidence of abnormal dilatation of the distal  Abdominal aorta. There is evidence of abnormal dilation of the Left Common  Iliac artery. The largest aortic measurement is 4.2 cm. The largest aortic  diameter remains essentially  unchanged compared to prior exam. Previous diameter  measurement was 4.3 cm  obtained on CT 10/2018. "  # Urinary urgency/incontinence/BPH S:not wearing pads- notes sudden urge to pee nad then has to rush to bathroom- occasionally incontinent  A/P: patient with BPH- flomax didn't help that much in past but hed like to retrial and see if makes a difference   #edema- stable for most part lately. Uses sparing lasix  Recommended follow up: Return in about 6 months (around 10/04/2020) for physical or sooner if needed. Future Appointments  Date Time Provider [place]  04/17/2020 11:00 AM [facility]  06/26/2020  2:30 PM [place], Stephani Police, DPM [facility]   Lab/Order associations:   ICD-10-CM   1. Essential hypertension  I10 CBC With Differential/Platelet    COMPLETE METABOLIC PANEL WITH GFR  2. Chronic gout without tophus, unspecified cause, unspecified site  [id number].[id number]   3. Mixed hyperlipidemia  E78.2  Meds ordered this encounter  Medications  . tamsulosin (FLOMAX) 0.4 MG CAPS capsule    Sig: Take 1 capsule (0.4 mg total) by mouth daily.    Dispense:  30 capsule    Refill:  5    Return precautions advised.  [name], MD

## 2020-04-05 ENCOUNTER — Ambulatory Visit: Payer: Medicare Other | Admitting: Family Medicine

## 2020-04-05 ENCOUNTER — Encounter: Payer: Self-pay | Admitting: Family Medicine

## 2020-04-05 ENCOUNTER — Other Ambulatory Visit: Payer: Self-pay | Admitting: *Deleted

## 2020-04-05 ENCOUNTER — Other Ambulatory Visit: Payer: Self-pay

## 2020-04-05 VITALS — BP 133/82 | HR 54 | Temp 97.6°F | Resp 18 | Ht 71.0 in | Wt 162.6 lb

## 2020-04-05 DIAGNOSIS — E782 Mixed hyperlipidemia: Secondary | ICD-10-CM | POA: Diagnosis not present

## 2020-04-05 DIAGNOSIS — M1A9XX0 Chronic gout, unspecified, without tophus (tophi): Secondary | ICD-10-CM | POA: Diagnosis not present

## 2020-04-05 DIAGNOSIS — I10 Essential (primary) hypertension: Secondary | ICD-10-CM | POA: Diagnosis not present

## 2020-04-05 DIAGNOSIS — N1831 Chronic kidney disease, stage 3a: Secondary | ICD-10-CM | POA: Diagnosis not present

## 2020-04-05 DIAGNOSIS — N401 Enlarged prostate with lower urinary tract symptoms: Secondary | ICD-10-CM

## 2020-04-05 MED ORDER — TAMSULOSIN HCL 0.4 MG PO CAPS: 0.4000 mg | ORAL_CAPSULE | Freq: Every day | ORAL | 5 refills | Status: DC

## 2020-04-06 LAB — CBC WITH DIFFERENTIAL/PLATELET
Absolute Monocytes: 566 cells/uL (ref 200–950)
Basophils Absolute: 50 cells/uL (ref 0–200)
Basophils Relative: 0.9 %
Eosinophils Absolute: 151 cells/uL (ref 15–500)
Eosinophils Relative: 2.7 %
HCT: 43.2 % (ref 38.5–50.0)
Hemoglobin: 14.4 g/dL (ref 13.2–17.1)
Lymphs Abs: 1266 cells/uL (ref 850–3900)
MCH: 31.2 pg (ref 27.0–33.0)
MCHC: 33.3 g/dL (ref 32.0–36.0)
MCV: 93.5 fL (ref 80.0–100.0)
MPV: 11.1 fL (ref 7.5–12.5)
Monocytes Relative: 10.1 %
Neutro Abs: 3567 cells/uL (ref 1500–7800)
Neutrophils Relative %: 63.7 %
Platelets: 114 10*3/uL — ABNORMAL LOW (ref 140–400)
RBC: 4.62 10*6/uL (ref 4.20–5.80)
RDW: 13 % (ref 11.0–15.0)
Total Lymphocyte: 22.6 %
WBC: 5.6 10*3/uL (ref 3.8–10.8)

## 2020-04-06 LAB — COMPLETE METABOLIC PANEL WITH GFR
AG Ratio: 1.9 (calc) (ref 1.0–2.5)
ALT: 11 U/L (ref 9–46)
AST: 19 U/L (ref 10–35)
Albumin: 4.1 g/dL (ref 3.6–5.1)
Alkaline phosphatase (APISO): 61 U/L (ref 35–144)
BUN/Creatinine Ratio: 21 (calc) (ref 6–22)
BUN: 24 mg/dL (ref 7–25)
CO2: 28 mmol/L (ref 20–32)
Calcium: 9.4 mg/dL (ref 8.6–10.3)
Chloride: 104 mmol/L (ref 98–110)
Creat: 1.13 mg/dL — ABNORMAL HIGH (ref 0.70–1.11)
GFR, Est African American: 65 mL/min/{1.73_m2} (ref >=60)
GFR, Est Non African American: 56 mL/min/{1.73_m2} — ABNORMAL LOW (ref >=60)
Globulin: 2.2 g/dL (calc) (ref 1.9–3.7)
Glucose, Bld: 87 mg/dL (ref 65–99)
Potassium: 4 mmol/L (ref 3.5–5.3)
Sodium: 143 mmol/L (ref 135–146)
Total Bilirubin: 0.9 mg/dL (ref 0.2–1.2)
Total Protein: 6.3 g/dL (ref 6.1–8.1)

## 2020-04-17 ENCOUNTER — Ambulatory Visit: Payer: Medicare Other

## 2020-05-06 ENCOUNTER — Ambulatory Visit: Payer: Medicare Other

## 2020-05-13 ENCOUNTER — Ambulatory Visit: Payer: Medicare Other

## 2020-05-19 ENCOUNTER — Other Ambulatory Visit: Payer: Self-pay | Admitting: Family Medicine

## 2020-05-19 DIAGNOSIS — Z5181 Encounter for therapeutic drug level monitoring: Secondary | ICD-10-CM

## 2020-05-21 ENCOUNTER — Telehealth: Payer: Self-pay

## 2020-05-21 NOTE — Telephone Encounter (Signed)
Patient called and left message stating he needed an appointment. Believe the patient may be a little confused. States he saw my number on a sheet of paper and called concerning blood test. Patient told me to disregard.

## 2020-05-22 ENCOUNTER — Ambulatory Visit (INDEPENDENT_AMBULATORY_CARE_PROVIDER_SITE_OTHER): Payer: Medicare Other | Admitting: General Practice

## 2020-05-22 ENCOUNTER — Other Ambulatory Visit: Payer: Self-pay

## 2020-05-22 DIAGNOSIS — I4891 Unspecified atrial fibrillation: Secondary | ICD-10-CM

## 2020-05-22 DIAGNOSIS — Z5181 Encounter for therapeutic drug level monitoring: Secondary | ICD-10-CM

## 2020-05-22 LAB — POCT INR: INR: 2.2 (ref 2.0–3.0)

## 2020-05-22 NOTE — Progress Notes (Signed)
I have reviewed and agree with note, evaluation, plan.   Nannette Zill, MD  

## 2020-05-22 NOTE — Patient Instructions (Addendum)
Pre visit review using our clinic review tool, if applicable. No additional management support is needed unless otherwise documented below in the visit note.  Continue to take 1 tablet daily except 2 tablets on Saturdays only.  Re-check in 6 weeks. 

## 2020-06-18 ENCOUNTER — Other Ambulatory Visit: Payer: Self-pay | Admitting: Family Medicine

## 2020-06-20 ENCOUNTER — Other Ambulatory Visit: Payer: Self-pay | Admitting: Family Medicine

## 2020-06-20 ENCOUNTER — Other Ambulatory Visit: Payer: Self-pay

## 2020-06-20 MED ORDER — ZOLPIDEM TARTRATE 5 MG PO TABS: 5.0000 mg | ORAL_TABLET | Freq: Every evening | ORAL | 5 refills | Status: DC | PRN

## 2020-06-20 NOTE — Telephone Encounter (Signed)
..   LAST APPOINTMENT DATE: 06/18/2020   NEXT APPOINTMENT DATE:@4 /13/2022  MEDICATION:zolpidem (AMBIEN) 5 MG tablet    PHARMACY:[id number] [address], [place] - [place]

## 2020-06-26 ENCOUNTER — Encounter: Payer: Self-pay | Admitting: Podiatry

## 2020-06-26 ENCOUNTER — Other Ambulatory Visit: Payer: Self-pay

## 2020-06-26 ENCOUNTER — Ambulatory Visit (INDEPENDENT_AMBULATORY_CARE_PROVIDER_SITE_OTHER): Payer: Medicare Other | Admitting: Podiatry

## 2020-06-26 DIAGNOSIS — M79675 Pain in left toe(s): Secondary | ICD-10-CM | POA: Diagnosis not present

## 2020-06-26 DIAGNOSIS — B351 Tinea unguium: Secondary | ICD-10-CM

## 2020-06-26 DIAGNOSIS — M79674 Pain in right toe(s): Secondary | ICD-10-CM

## 2020-06-30 NOTE — Progress Notes (Signed)
Subjective: William Mahoney is a pleasant 87 y.o. male patient seen today painful thick toenails that are difficult to trim. Pain interferes with ambulation. Aggravating factors include wearing enclosed shoe gear. Pain is relieved with periodic professional debridement.  He states there was a mix-up with his wife's appointment and she was unable to be seen today. He voices no new pedal concerns on today's visit.  PCP is Dr. [name]. Last visit was 04/05/2020.  No Known Allergies  Objective: Physical Exam  General: William Mahoney is a pleasant 87 y.o. Caucasian male, WD, WN in NAD. AAO x 3.   Vascular:  Capillary refill time to digits immediate b/l. Palpable pedal pulses b/l LE. Pedal hair absent b/l lower extremities. Lower extremity skin temperature gradient within normal limits. No pain with calf compression b/l. No edema noted b/l lower extremities.   Dermatological:  Pedal skin with . No open wounds bilaterally. No interdigital macerations bilaterally. Toenails 1-5 b/l elongated, discolored, dystrophic, thickened, crumbly with subungual debris and tenderness to dorsal palpation. No hyperkeratotic nor porokeratotic lesions present on today's visit.  Musculoskeletal:  Normal muscle strength 5/5 to all lower extremity muscle groups bilaterally. No pain crepitus or joint limitation noted with ROM b/l. No gross bony deformities bilaterally. Patient ambulates independent of any assistive aids.  Neurological:  Protective sensation intact 5/5 intact bilaterally with 10g monofilament b/l. Vibratory sensation intact b/l. Clonus negative b/l.  Assessment and Plan:  1. Pain due to onychomycosis of toenails of both feet     -Examined patient. -No new findings. No new orders. -Toenails 1-5 b/l were debrided in length and girth with sterile nail nippers and dremel without iatrogenic bleeding.  -Patient to report any pedal injuries to medical professional immediately. -Patient to continue  soft, supportive shoe gear daily. -Patient/POA to call should there be question/concern in the interim.  Return in about 3 months (around 09/26/2020) for nail trim.  Marzetta Board, DPM

## 2020-07-06 ENCOUNTER — Other Ambulatory Visit: Payer: Self-pay | Admitting: Family Medicine

## 2020-07-31 ENCOUNTER — Other Ambulatory Visit: Payer: Self-pay

## 2020-07-31 ENCOUNTER — Ambulatory Visit (INDEPENDENT_AMBULATORY_CARE_PROVIDER_SITE_OTHER): Payer: Medicare Other | Admitting: General Practice

## 2020-07-31 DIAGNOSIS — I4891 Unspecified atrial fibrillation: Secondary | ICD-10-CM | POA: Diagnosis not present

## 2020-07-31 DIAGNOSIS — Z5181 Encounter for therapeutic drug level monitoring: Secondary | ICD-10-CM

## 2020-07-31 LAB — POCT INR: INR: 2.4 (ref 2.0–3.0)

## 2020-07-31 NOTE — Patient Instructions (Addendum)
Pre visit review using our clinic review tool, if applicable. No additional management support is needed unless otherwise documented below in the visit note.  Continue taking 1 tablet daily except 1 1/2 tablets on Saturdays.  Re-check in 6 weeks.

## 2020-08-01 NOTE — Progress Notes (Signed)
I have reviewed and agree with note, evaluation, plan.   Cordon Gassett, MD  

## 2020-08-14 ENCOUNTER — Ambulatory Visit (INDEPENDENT_AMBULATORY_CARE_PROVIDER_SITE_OTHER): Payer: Medicare Other | Admitting: Cardiovascular Disease

## 2020-08-14 ENCOUNTER — Encounter: Payer: Self-pay | Admitting: Cardiovascular Disease

## 2020-08-14 ENCOUNTER — Other Ambulatory Visit: Payer: Self-pay

## 2020-08-14 DIAGNOSIS — E782 Mixed hyperlipidemia: Secondary | ICD-10-CM | POA: Diagnosis not present

## 2020-08-14 DIAGNOSIS — I4821 Permanent atrial fibrillation: Secondary | ICD-10-CM | POA: Diagnosis not present

## 2020-08-14 DIAGNOSIS — I10 Essential (primary) hypertension: Secondary | ICD-10-CM

## 2020-08-14 NOTE — Patient Instructions (Signed)
Medication Instructions:   -Only take metoprolol succinate (toprolol-xl) 12.5mg  once daily.   -Stop taking metoprolol tartrate (lopressor).  *If you need a refill on your cardiac medications before your next appointment, please call your pharmacy*   Follow-Up: At [facility], you and your health needs are our priority.  As part of our continuing mission to provide you with exceptional heart care, we have created designated Provider Care Teams.  These Care Teams include your primary Cardiologist (physician) and Advanced Practice Providers (APPs -  Physician Assistants and Nurse Practitioners) who all work together to provide you with the care you need, when you need it.  We recommend signing up for the patient portal called "MyChart".  Sign up information is provided on this After Visit Summary.  MyChart is used to connect with patients for Virtual Visits (Telemedicine).  Patients are able to view lab/test results, encounter notes, upcoming appointments, etc.  Non-urgent messages can be sent to your provider as well.   To learn more about what you can do with MyChart, go to [email or web address].    Your next appointment:   12 month(s)  The format for your next appointment:   In Person  Provider:   Quay Burow, MD

## 2020-08-14 NOTE — Assessment & Plan Note (Signed)
History of atrial fibrillation rate controlled on warfarin oral anticoagulation.  His heart rate is slightly slow 44.  I am going to decrease his Toprol-XL from 12.5 twice daily to 12.5 once a day.

## 2020-08-14 NOTE — Assessment & Plan Note (Signed)
History of abdominal aortic aneurysm measuring 4.3 cm by duplex ultrasound 11/01/2019 followed by Dr. Donzetta Matters.

## 2020-08-14 NOTE — Assessment & Plan Note (Signed)
History of essential hypertension blood pressure measured today 140/82.  He is on amlodipine and metoprolol.

## 2020-08-14 NOTE — Assessment & Plan Note (Signed)
History of hyperlipidemia not on statin therapy with lipid profile performed 10/05/2019 revealing a total cholesterol of 182, LDL 114 and HDL 32.

## 2020-08-14 NOTE — Progress Notes (Signed)
08/14/2020 William Mahoney   [birth year]  [id number]  Primary Physician William Mahoney Brayton Mars, MD Primary Cardiologist: William Harp MD William Mahoney, [place]  HPI:  William Mahoney is a 87 y.o.  thin-appearing married Caucasian male father of 60 children, grandfather of 35 grandchildren referred for preoperative clearance before a elective right inguinal hernia repair which was successfully performed by Dr. Dalbert Mahoney. His primary care physician is Dr. Yong Mahoney. I last saw him in the office  06/27/2019.Marland KitchenHis cardiovascular risk factor profile is notable for 90 pack years of tobacco abuse having quit 6 years ago. History of hypertension. He has never had a heart attack or stroke. He has had colon cancer which was surgically excised profile/31/09 after she stops smoking. He does have chronic atrial fibrillation only on aspirin though his CHA2DSVASC2 score is 2. I did begin him on Coumadin anticoagulation as a result of this.He had a stress test 15 years ago which led to cardiac catheterization which was apparently normal.   Since I saw him a year ago he continues to do well.  His infrarenal abdominal aneurysm measures approximately 4.2 cm and this is followed by Dr. Donzetta Mahoney.  He denies chest pain or shortness of breath.  His A. fib is rate controlled on warfarin oral anticoagulation which is followed by his PCP.   Current Meds  Medication Sig  . allopurinol (ZYLOPRIM) 300 MG tablet TAKE ONE-HALF TABLET BY MOUTH IN THE EVENING  . amLODipine (NORVASC) 2.5 MG tablet TAKE 1 TABLET BY MOUTH EVERY DAY  . colchicine 0.6 MG tablet Take 0.6 mg by mouth 2 (two) times daily as needed (gout).  . fish oil-omega-3 fatty acids 1000 MG capsule Take 1 g by mouth daily.  . fluticasone (FLONASE) 50 MCG/ACT nasal spray Place 2 sprays into both nostrils daily.  . furosemide (LASIX) 20 MG tablet TAKE 1 TABLET BY MOUTH EVERY DAY AS NEEDED  . metoprolol succinate (TOPROL-XL) 25 MG 24 hr tablet Take 0.5 tablets (12.5 mg total)  by mouth daily.  . Multiple Vitamin (MULTIVITAMIN) tablet Take 1 tablet by mouth daily.  . tamsulosin (FLOMAX) 0.4 MG CAPS capsule Take 1 capsule (0.4 mg total) by mouth daily.  [name] triamterene-hydrochlorothiazide (MAXZIDE-25) 37.5-25 MG tablet TAKE 1/2 TABLET BY MOUTH DAILY  . warfarin (COUMADIN) 2.5 MG tablet TAKE 1 TABLET DAILY EXCEPT TAKE 1 AND 1/2 TABLETS ON WED AND SAT OR TAKE AS DIRECTED BY CLINIC  . zolpidem (AMBIEN) 5 MG tablet Take 1 tablet (5 mg total) by mouth at bedtime as needed for sleep.  . [DISCONTINUED] metoprolol tartrate (LOPRESSOR) 25 MG tablet TAKE 1/2 TABLET BY MOUTH TWICE A DAY     No Known Allergies  Social History   Socioeconomic History  . Marital status: Married    Spouse name: Not on file  . Number of children: 6  . Years of education: Not on file  . Highest education level: Not on file  Occupational History  . Occupation: Retired    Fish farm manager: RETIRED  Tobacco Use  . Smoking status: Former Smoker    Packs/day: 1.00    Years: 55.00    Pack years: 55.00    Types: Cigarettes    Quit date: 03/20/2008    Years since quitting: 12.4  . Smokeless tobacco: Never Used  Vaping Use  . Vaping Use: Never used  Substance and Sexual Activity  . Alcohol use: No    Comment: hx alcohol abuse -- quit in 1980's   . Drug use: No  .  Sexual activity: Not on file  Other Topics Concern  . Not on file  Social History Narrative   Married. 6 kids. Wife William Mahoney also a patient of Dr. Ronney Lion.       Retired.    Social Determinants of Health   Financial Resource Strain: Not on file  Food Insecurity: Not on file  Transportation Needs: Not on file  Physical Activity: Not on file  Stress: Not on file  Social Connections: Not on file  Intimate Partner Violence: Not on file     Review of Systems: General: negative for chills, fever, night sweats or weight changes.  Cardiovascular: negative for chest pain, dyspnea on exertion, edema, orthopnea, palpitations, paroxysmal  nocturnal dyspnea or shortness of breath Dermatological: negative for rash Respiratory: negative for cough or wheezing Urologic: negative for hematuria Abdominal: negative for nausea, vomiting, diarrhea, bright red blood per rectum, melena, or hematemesis Neurologic: negative for visual changes, syncope, or dizziness All other systems reviewed and are otherwise negative except as noted above.    Blood pressure 140/82, pulse (!) 41, height 5\' 11"  (1.803 m), weight 163 lb (73.9 kg).  General appearance: alert and no distress Neck: no adenopathy, no carotid bruit, no JVD, supple, symmetrical, trachea midline and thyroid not enlarged, symmetric, no tenderness/mass/nodules Lungs: clear to auscultation bilaterally Heart: irregularly irregular rhythm Extremities: extremities normal, atraumatic, no cyanosis or edema Pulses: 2+ and symmetric Skin: Skin color, texture, turgor normal. No rashes or lesions Neurologic: Alert and oriented X 3, normal strength and tone. Normal symmetric reflexes. Normal coordination and gait  EKG atrial fibrillation with ventricular spots of 41 and right bundle branch block.  I personally reviewed this EKG.  ASSESSMENT AND PLAN:   Hyperlipidemia, unspecified History of hyperlipidemia not on statin therapy with lipid profile performed 10/05/2019 revealing a total cholesterol of 182, LDL 114 and HDL 32.  Essential hypertension History of essential hypertension blood pressure measured today 140/82.  He is on amlodipine and metoprolol.  Atrial fibrillation History of atrial fibrillation rate controlled on warfarin oral anticoagulation.  His heart rate is slightly slow 44.  I am going to decrease his Toprol-XL from 12.5 twice daily to 12.5 once a day.  AAA (abdominal aortic aneurysm) without rupture (HCC) History of abdominal aortic aneurysm measuring 4.3 cm by duplex ultrasound 11/01/2019 followed by Dr. Donzetta Mahoney.      William Harp MD FACP,FACC,FAHA,  [facility] 08/14/2020 4:09 PM

## 2020-08-15 ENCOUNTER — Other Ambulatory Visit: Payer: Self-pay | Admitting: Family Medicine

## 2020-08-26 ENCOUNTER — Other Ambulatory Visit: Payer: Self-pay | Admitting: Family Medicine

## 2020-09-13 ENCOUNTER — Other Ambulatory Visit: Payer: Self-pay | Admitting: Family Medicine

## 2020-09-18 ENCOUNTER — Ambulatory Visit (INDEPENDENT_AMBULATORY_CARE_PROVIDER_SITE_OTHER): Payer: Medicare Other | Admitting: General Practice

## 2020-09-18 ENCOUNTER — Other Ambulatory Visit: Payer: Self-pay

## 2020-09-18 DIAGNOSIS — I4891 Unspecified atrial fibrillation: Secondary | ICD-10-CM | POA: Diagnosis not present

## 2020-09-18 DIAGNOSIS — Z5181 Encounter for therapeutic drug level monitoring: Secondary | ICD-10-CM

## 2020-09-18 LAB — POCT INR: INR: 2.2 (ref 2.0–3.0)

## 2020-09-18 NOTE — Patient Instructions (Signed)
Pre visit review using our clinic review tool, if applicable. No additional management support is needed unless otherwise documented below in the visit note. 

## 2020-09-18 NOTE — Progress Notes (Signed)
I have reviewed and agree with note, evaluation, plan.   Shelena Castelluccio, MD  

## 2020-10-03 ENCOUNTER — Other Ambulatory Visit: Payer: Self-pay | Admitting: Family Medicine

## 2020-10-04 ENCOUNTER — Other Ambulatory Visit: Payer: Self-pay

## 2020-10-07 ENCOUNTER — Ambulatory Visit: Payer: Medicare Other | Admitting: Podiatry

## 2020-10-09 ENCOUNTER — Other Ambulatory Visit: Payer: Self-pay | Admitting: Family Medicine

## 2020-10-14 ENCOUNTER — Ambulatory Visit: Payer: Medicare Other | Admitting: Podiatry

## 2020-10-18 ENCOUNTER — Encounter: Payer: Medicare Other | Admitting: Family Medicine

## 2020-10-30 ENCOUNTER — Ambulatory Visit: Payer: Medicare Other

## 2020-11-06 ENCOUNTER — Ambulatory Visit: Payer: Medicare Other | Admitting: Registered Nurse

## 2020-11-06 ENCOUNTER — Other Ambulatory Visit: Payer: Self-pay

## 2020-11-06 ENCOUNTER — Encounter: Payer: Self-pay | Admitting: Registered Nurse

## 2020-11-06 VITALS — BP 143/64 | HR 60 | Temp 98.1°F | Resp 16 | Ht 71.0 in | Wt 161.4 lb

## 2020-11-06 DIAGNOSIS — N451 Epididymitis: Secondary | ICD-10-CM | POA: Diagnosis not present

## 2020-11-06 MED ORDER — CEFTRIAXONE SODIUM 1 G IJ SOLR
1.0000 g | Freq: Once | INTRAMUSCULAR | Status: AC
Start: 2020-11-06 — End: 2020-11-06
  Administered 2020-11-06: 1 g via INTRAMUSCULAR

## 2020-11-06 MED ORDER — LEVOFLOXACIN 500 MG PO TABS
500.0000 mg | ORAL_TABLET | Freq: Every day | ORAL | 0 refills | Status: AC
Start: 2020-11-06 — End: 2020-11-13

## 2020-11-06 NOTE — Patient Instructions (Signed)
William Mahoney -  Doristine Devoid to meet you.  Today: Concern for epididymitis. This is an infection in one of the vessels around the testicle. Similar to what you had in 2018. Seems like we've caught it early. Can give 1g rocephin injection in the muscle today and a week of levofloxacin 500mg  daily. This may upset your stomach. Would recommend plenty of fluids, taking near a meal, and probiotic/prebiotic food to help keep your gut happy. Some data shows older fluoroquinolones may interact with warfarin but newer data shows that a newer fluoroquinolone like levofloxacin should not. Still, keep an eye on any symptoms. Let me know if pain worsens or fails to improve No sign of hernia today Can call urologist if further concerns arise  Skin lesion: On chest, looks benign, likely just slow to heal with scratching Would recommend giving your dermatologist a call for routine skin exam   AAA (abdominal aortic aneurysm) Routine screening recommended - this is to make sure it doesn't get worse or risk rupture - sometimes we stop screening if we have "bigger fish to fry" - that is to say if a patient's other health is more worrisome, but you're so healthy that I think it's wise to continue screening  Follow up with Dr. Yong Channel as scheduled. He will be Cc'd on today's visit  Thank you  Denice Paradise

## 2020-11-06 NOTE — Progress Notes (Signed)
Acute Office Visit  Subjective:    Patient ID: William Mahoney, male    DOB: 1926-03-14, 87 y.o.   MRN: [id number]  Chief Complaint  Patient presents with   Testicle Pain    Patient states that he has been noticing some testicle pain and swelling and groin problems. He states he has had an operation before but it has been enlarged again.    HPI Patient is in today for r testicle pain and swelling.  Ongoing perhaps around one week Worsening these past few days. No redness or darkening of skin No bowel changes- notes hx of R inguinal hernia w repair, does not feel similar Last year did have epididymitis/orchitis with reactive hydrocele that resulted in surgical repair of hydrocele Pain feeling similar to onset of epididymitis.  No systemic symptoms Denies urinary symptoms.  N/a in regards to ejaculatory symptoms.   Also has questions on skin lesion: upper middle of chest, unsure what it looked like at start, but scratches a lot and notes it doesn't heal. Only mildly itchy. Not spreading. Scabs appropriately. No other spots. Notes he is overdue for follow up with his dermatologist.  Questions on AAA Noted on routine screening Attends annual recommended imaging screening Wants to ensure this is still important to do and of its overall utility.  Past Medical History:  Diagnosis Date   BPH (benign prostatic hypertrophy)    Chronic atrial fibrillation [facility])    cardiologist--   dr berry   COPD (chronic obstructive pulmonary disease) ([place])    Diverticulosis of colon    MODERATE LEFT SIDE   Epididymal cyst    w/ epididymalitis   Full dentures    Hand foot syndrome    secondary to chemotherapy ([place])   cold hands/feet   Hearing loss    History of alcohol abuse    quit drinking in the mid 80's   History of cirrhosis of liver    alcoholic--  hx alcohol abuse -- quit drinking 1980's   History of gout    History of melanoma excision    2013--  left ear lobe and back of hand    History of pancreatitis    2008   History of rectal cancer oncologist-  dr Benay Spice--  no recurrence   dx Sept 2009--  Stage II (T3N0)  s/p  sigmoid colectomy & low anterior resection 04-18-2008  and chemoradiation 2010   History of shingles 07/27/2010   History of squamous cell carcinoma excision    left lower leg   Hyperlipidemia    Hypertension    Loose stools    DUE TO ANTIBIOTICS   Macular degeneration    not sure which eye   OA (osteoarthritis)    RBBB (right bundle branch block)    Renal lesion    chronic-- left side   Sciatica of right side    Urge urinary incontinence    intermittant    Past Surgical History:  Procedure Laterality Date   CARDIAC CATHETERIZATION  2001  approx.  in [place]  IMPLANT, BILATERAL     COLONOSCOPY  last one 06-27-2012   EXPLORATORY LAPAROTOMY/  LOW ANTERIOR RESECTION/  SIGMOID COLECTOMY  04-18-2008   DR INGRAM   INCISION AND DRAINAGE ABSCESS N/A 11/02/2014   Procedure: INCISION AND DRAINAGE OF SCROTUM;  Surgeon: Ardis Hughs, MD;  Location: [facility];  Service: Urology;  Laterality: N/A;   INGUINAL HERNIA REPAIR Right 03/23/2014  Procedure: HERNIA REPAIR INGUINAL ADULT OPEN REPAIR RIGHT INGUINAL HERNIA REPAIR;  Surgeon: Fanny Skates, MD;  Location: [place];  Service: General;  Laterality: Right;   INSERTION OF MESH Right 03/23/2014   Procedure: INSERTION OF MESH;  Surgeon: Fanny Skates, MD;  Location: [place];  Service: General;  Laterality: Right;   LAPAROSCOPIC CHOLECYSTECTOMY  1990's   MASS EXCISION N/A 11/02/2014   Procedure: EXCISION OF EPIDIDYMAL CYST;  Surgeon: Ardis Hughs, MD;  Location: [facility];  Service: Urology;  Laterality: N/A;   TONSILLECTOMY  as child   TRANSTHORACIC ECHOCARDIOGRAM  05-11-2008   pseudonormal LV filling pattern,  ef 60-65%/  mild to moderate MV calcification no stenosis w/ mild to moderate regurg./  mild LAE and RAE/  mild TR     Family History  Problem Relation Age of Onset   Hypertension Mother    Lymphoma Father    Lymphoma Other    Stroke Other        1st degree relative   Colon cancer Neg Hx    Esophageal cancer Neg Hx    Rectal cancer Neg Hx    Stomach cancer Neg Hx     Social History   Socioeconomic History   Marital status: Married    Spouse name: Not on file   Number of children: 6   Years of education: Not on file   Highest education level: Not on file  Occupational History   Occupation: Retired    Fish farm manager: RETIRED  Tobacco Use   Smoking status: Former    Packs/day: 1.00    Years: 55.00    Pack years: 55.00    Types: Cigarettes    Quit date: 03/20/2008    Years since quitting: 12.6   Smokeless tobacco: Never  Vaping Use   Vaping Use: Never used  Substance and Sexual Activity   Alcohol use: No    Comment: hx alcohol abuse -- quit in 1980's    Drug use: No   Sexual activity: Not on file  Other Topics Concern   Not on file  Social History Narrative   Married. 6 kids. Wife Joycelyn Schmid also a patient of Dr. Ronney Lion.       Retired.    Social Determinants of Health   Financial Resource Strain: Not on file  Food Insecurity: Not on file  Transportation Needs: Not on file  Physical Activity: Not on file  Stress: Not on file  Social Connections: Not on file  Intimate Partner Violence: Not on file    Outpatient Medications Prior to Visit  Medication Sig Dispense Refill   allopurinol (ZYLOPRIM) 300 MG tablet TAKE ONE-HALF TABLET BY MOUTH IN THE EVENING 45 tablet 1   amLODipine (NORVASC) 2.5 MG tablet TAKE 1 TABLET BY MOUTH EVERY DAY 90 tablet 1   colchicine 0.6 MG tablet Take 0.6 mg by mouth 2 (two) times daily as needed (gout).     fish oil-omega-3 fatty acids 1000 MG capsule Take 1 g by mouth daily.     fluticasone (FLONASE) 50 MCG/ACT nasal spray Place 2 sprays into both nostrils daily. 48 g 3   furosemide (LASIX) 20 MG tablet TAKE 1 TABLET BY MOUTH EVERY DAY AS NEEDED 90  tablet 1   metoprolol succinate (TOPROL-XL) 25 MG 24 hr tablet Take 0.5 tablets (12.5 mg total) by mouth daily. 45 tablet 3   metoprolol tartrate (LOPRESSOR) 25 MG tablet Take 12.5 mg by mouth 2 (two) times daily.     Multiple Vitamin (  MULTIVITAMIN) tablet Take 1 tablet by mouth daily.     tamsulosin (FLOMAX) 0.4 MG CAPS capsule TAKE 1 CAPSULE BY MOUTH EVERY DAY 90 capsule 1   triamterene-hydrochlorothiazide (MAXZIDE-25) 37.5-25 MG tablet TAKE 1/2 TABLET BY MOUTH DAILY 45 tablet 1   warfarin (COUMADIN) 2.5 MG tablet TAKE 1 TABLET DAILY EXCEPT TAKE 1 AND 1/2 TABLETS ON WED AND SAT OR TAKE AS DIRECTED BY CLINIC 120 tablet 1   zolpidem (AMBIEN) 5 MG tablet Take 1 tablet (5 mg total) by mouth at bedtime as needed for sleep. 15 tablet 5   No facility-administered medications prior to visit.    No Known Allergies  Review of Systems Per hpi      Objective:    Physical Exam Vitals and nursing note reviewed.  Constitutional:      Appearance: Normal appearance.  Cardiovascular:     Rate and Rhythm: Normal rate and regular rhythm.     Pulses: Normal pulses.     Heart sounds: Normal heart sounds. No murmur heard.   No friction rub. No gallop.  Pulmonary:     Effort: Pulmonary effort is normal. No respiratory distress.     Breath sounds: Normal breath sounds. No stridor. No wheezing, rhonchi or rales.  Abdominal:     Hernia: There is no hernia in the right inguinal area.  Genitourinary:    Pubic Area: No rash or pubic lice.      Penis: Normal.      Testes: Cremasteric reflex is present.        Right: Tenderness and swelling present. Testicular hydrocele or varicocele not present.        Left: Tenderness or swelling not present.     Epididymis:     Right: Inflamed. Tenderness present.  Lymphadenopathy:     Lower Body: No right inguinal adenopathy.  Skin:    Findings: Lesion (scab central upper chest.) present.  Neurological:     General: No focal deficit present.     Mental Status:  He is alert. Mental status is at baseline.  Psychiatric:        Mood and Affect: Mood normal.        Behavior: Behavior normal.        Thought Content: Thought content normal.        Judgment: Judgment normal.    BP (!) 143/64   Pulse 60   Temp 98.1 F (36.7 C) (Temporal)   Resp 16   Ht 5\' 11"  (1.803 m)   Wt 161 lb 6.4 oz (73.2 kg)   SpO2 95%   BMI 22.51 kg/m  Wt Readings from Last 3 Encounters:  11/06/20 161 lb 6.4 oz (73.2 kg)  08/14/20 163 lb (73.9 kg)  04/05/20 162 lb 9.6 oz (73.8 kg)    There are no preventive care reminders to display for this patient.  There are no preventive care reminders to display for this patient.   Lab Results  Component Value Date   TSH 3.97 10/05/2019   Lab Results  Component Value Date   WBC 5.6 04/05/2020   HGB 14.4 04/05/2020   HCT 43.2 04/05/2020   MCV 93.5 04/05/2020   PLT 114 (L) 04/05/2020   Lab Results  Component Value Date   NA 143 04/05/2020   K 4.0 04/05/2020   CHLORIDE 104 08/10/2013   CO2 28 04/05/2020   GLUCOSE 87 04/05/2020   BUN 24 04/05/2020   CREATININE 1.13 (H) 04/05/2020   BILITOT 0.9 04/05/2020   ALKPHOS  68 10/05/2019   AST 19 04/05/2020   ALT 11 04/05/2020   PROT 6.3 04/05/2020   ALBUMIN 4.4 10/05/2019   CALCIUM 9.4 04/05/2020   ANIONGAP 15 03/16/2014   GFR 54.53 (L) 10/05/2019   Lab Results  Component Value Date   CHOL 182 10/05/2019   Lab Results  Component Value Date   HDL 32.80 (L) 10/05/2019   Lab Results  Component Value Date   LDLCALC 114 (H) 10/05/2019   Lab Results  Component Value Date   TRIG 172.0 (H) 10/05/2019   Lab Results  Component Value Date   CHOLHDL 6 10/05/2019   No results found for: HGBA1C     Assessment & Plan:   Problem List Items Addressed This Visit   None Visit Diagnoses     Epididymitis    -  Primary   Relevant Medications   cefTRIAXone (ROCEPHIN) injection 1 g (Completed)   levofloxacin (LEVAQUIN) 500 MG tablet        Meds ordered this  encounter  Medications   cefTRIAXone (ROCEPHIN) injection 1 g   levofloxacin (LEVAQUIN) 500 MG tablet    Sig: Take 1 tablet (500 mg total) by mouth daily for 7 days.    Dispense:  7 tablet    Refill:  0    Order Specific Question:   Supervising Provider    Answer:   Carlota Raspberry, JEFFREY R [[id number]]   PLAN Tenderness on posterior R testicle indicative of early epididymitis. Will give IM rocephin and levaquin 500mg  PO qd for 7 days. Discussed risks and benefits of course, pt agrees as he wants to avoid further risk for hydrocele. Note that older fluoroquinolones have been seen to interact with warfarin, though data is not seen for this in newer fluoroquinolones like levaquin.  Return precautions reviewed Reviewed importance of AAA monitoring with patient, discussed outlook. Overall great health for his age - I think it's important to keep screening per PCP's recommendations  Plan to follow up with derm regarding skin lesion. Patient encouraged to call clinic with any questions, comments, or concerns.   Maximiano Coss, NP

## 2020-11-15 ENCOUNTER — Ambulatory Visit (HOSPITAL_BASED_OUTPATIENT_CLINIC_OR_DEPARTMENT_OTHER)
Admission: RE | Admit: 2020-11-15 | Discharge: 2020-11-15 | Disposition: A | Discharge status: Home / Self Care | Payer: Medicare Other | Source: Ambulatory Visit | Attending: Family Medicine | Admitting: Family Medicine

## 2020-11-15 ENCOUNTER — Other Ambulatory Visit: Payer: Self-pay | Admitting: Family Medicine

## 2020-11-15 ENCOUNTER — Telehealth: Payer: Self-pay

## 2020-11-15 ENCOUNTER — Other Ambulatory Visit: Payer: Self-pay

## 2020-11-15 DIAGNOSIS — I714 Abdominal aortic aneurysm, without rupture, unspecified: Secondary | ICD-10-CM

## 2020-11-15 DIAGNOSIS — N50811 Right testicular pain: Secondary | ICD-10-CM

## 2020-11-15 NOTE — Telephone Encounter (Signed)
Hi Richard, this pt was seen by you on 07/20. See below.

## 2020-11-15 NOTE — Telephone Encounter (Signed)
Please see message and advise 

## 2020-11-15 NOTE — Telephone Encounter (Signed)
[place] ordered and I have called and made pt daughter aware that someone will be calling them to get that scheduled.

## 2020-11-15 NOTE — Telephone Encounter (Signed)
First step is testicular ultrasound ultrasound-under right testicular pain-order as urgent Instead we will likely not be able to get this today-at the latest please try to get this set up for Monday-we can determine next steps after we have that imaging and look at working him in but I need imaging to further evaluate at this point

## 2020-11-15 NOTE — Telephone Encounter (Signed)
Patient is calling in stating that he was prescribed antibiotics, and finished them on Tuesday/Wednesday and said he has had no relief and no change in symptoms. Wanting to know what is next.

## 2020-11-15 NOTE — Telephone Encounter (Signed)
Daughter states patient was last seen for infection in his testicles.  States he has completed the round of antibiotic and is not any better.    States patient does not like to see any other providers.  Is requesting to be worked in with Dr. Yong Channel next week.  Please advise.

## 2020-11-18 ENCOUNTER — Other Ambulatory Visit: Payer: Self-pay

## 2020-11-18 ENCOUNTER — Encounter: Payer: Self-pay | Admitting: Podiatry

## 2020-11-18 ENCOUNTER — Ambulatory Visit (INDEPENDENT_AMBULATORY_CARE_PROVIDER_SITE_OTHER): Payer: Medicare Other | Admitting: Podiatry

## 2020-11-18 ENCOUNTER — Telehealth: Payer: Self-pay

## 2020-11-18 DIAGNOSIS — N1831 Chronic kidney disease, stage 3a: Secondary | ICD-10-CM

## 2020-11-18 DIAGNOSIS — B351 Tinea unguium: Secondary | ICD-10-CM | POA: Diagnosis not present

## 2020-11-18 DIAGNOSIS — M79675 Pain in left toe(s): Secondary | ICD-10-CM

## 2020-11-18 DIAGNOSIS — D696 Thrombocytopenia, unspecified: Secondary | ICD-10-CM

## 2020-11-18 DIAGNOSIS — I739 Peripheral vascular disease, unspecified: Secondary | ICD-10-CM | POA: Diagnosis not present

## 2020-11-18 DIAGNOSIS — M79674 Pain in right toe(s): Secondary | ICD-10-CM

## 2020-11-18 NOTE — Telephone Encounter (Signed)
Please be advised. Fatima calling from [place] to report that, Medicare will not cover US Aorta screening, she states it is only for ages 19-75. She will schedule regular screening if covered my medicare.

## 2020-11-18 NOTE — Progress Notes (Signed)
This patient returns to my office for at risk foot care.  This patient requires this care by a professional since this patient will be at risk due to having  PAD, CKD coagulation defect and thrombocytopenia.  Patient is taking coumadin.  This patient is unable to cut nails himself since the patient cannot reach his nails.These nails are painful walking and wearing shoes.  This patient presents for at risk foot care today.  General Appearance  Alert, conversant and in no acute stress.  Vascular  Dorsalis pedis and posterior tibial  pulses are  weakly palpable  bilaterally.  Capillary return is within normal limits  bilaterally. Temperature is within normal limits  bilaterally. Venous disease feet dorsally  B/l.  Neurologic  Senn-Weinstein monofilament wire test within normal limits  bilaterally. Muscle power within normal limits bilaterally.  Nails Thick disfigured discolored nails with subungual debris  from hallux to fifth toes bilaterally. No evidence of bacterial infection or drainage bilaterally.  Orthopedic  No limitations of motion  feet .  No crepitus or effusions noted.  No bony pathology or digital deformities noted.  Skin  normotropic skin with no porokeratosis noted bilaterally.  No signs of infections or ulcers noted.     Onychomycosis  Pain in right toes  Pain in left toes  Consent was obtained for treatment procedures.   Mechanical debridement of nails 1-5  bilaterally performed with a nail nipper.  Filed with dremel without incident.    Return office visit    4 months                  Told patient to return for periodic foot care and evaluation due to potential at risk complications.   Gardiner Barefoot DPM

## 2020-11-18 NOTE — Telephone Encounter (Signed)
Sounds good- you may reorder under AAA with the new order if needed

## 2020-11-19 ENCOUNTER — Other Ambulatory Visit: Payer: Medicare Other

## 2020-11-27 ENCOUNTER — Other Ambulatory Visit: Payer: Self-pay

## 2020-11-27 ENCOUNTER — Other Ambulatory Visit: Payer: Medicare Other

## 2020-11-27 ENCOUNTER — Ambulatory Visit (HOSPITAL_BASED_OUTPATIENT_CLINIC_OR_DEPARTMENT_OTHER)
Admission: RE | Admit: 2020-11-27 | Discharge: 2020-11-27 | Disposition: A | Discharge status: Home / Self Care | Payer: Medicare Other | Source: Ambulatory Visit | Attending: Family Medicine | Admitting: Family Medicine

## 2020-11-27 DIAGNOSIS — I714 Abdominal aortic aneurysm, without rupture, unspecified: Secondary | ICD-10-CM

## 2020-11-28 ENCOUNTER — Encounter (HOSPITAL_BASED_OUTPATIENT_CLINIC_OR_DEPARTMENT_OTHER): Payer: Self-pay | Admitting: Emergency Medicine

## 2020-11-28 ENCOUNTER — Emergency Department (HOSPITAL_BASED_OUTPATIENT_CLINIC_OR_DEPARTMENT_OTHER): Payer: Medicare Other | Admitting: Radiology

## 2020-11-28 ENCOUNTER — Emergency Department (HOSPITAL_BASED_OUTPATIENT_CLINIC_OR_DEPARTMENT_OTHER)
Admission: EM | Admit: 2020-11-28 | Discharge: 2020-11-28 | Disposition: A | Discharge status: Home / Self Care | Payer: Medicare Other | Attending: Emergency Medicine | Admitting: Emergency Medicine

## 2020-11-28 DIAGNOSIS — I129 Hypertensive chronic kidney disease with stage 1 through stage 4 chronic kidney disease, or unspecified chronic kidney disease: Secondary | ICD-10-CM | POA: Insufficient documentation

## 2020-11-28 DIAGNOSIS — Z7901 Long term (current) use of anticoagulants: Secondary | ICD-10-CM | POA: Diagnosis not present

## 2020-11-28 DIAGNOSIS — Z79899 Other long term (current) drug therapy: Secondary | ICD-10-CM | POA: Insufficient documentation

## 2020-11-28 DIAGNOSIS — J449 Chronic obstructive pulmonary disease, unspecified: Secondary | ICD-10-CM | POA: Insufficient documentation

## 2020-11-28 DIAGNOSIS — Z85048 Personal history of other malignant neoplasm of rectum, rectosigmoid junction, and anus: Secondary | ICD-10-CM | POA: Insufficient documentation

## 2020-11-28 DIAGNOSIS — Z87891 Personal history of nicotine dependence: Secondary | ICD-10-CM | POA: Diagnosis not present

## 2020-11-28 DIAGNOSIS — M25572 Pain in left ankle and joints of left foot: Secondary | ICD-10-CM | POA: Insufficient documentation

## 2020-11-28 DIAGNOSIS — M25472 Effusion, left ankle: Secondary | ICD-10-CM

## 2020-11-28 DIAGNOSIS — M7989 Other specified soft tissue disorders: Secondary | ICD-10-CM | POA: Diagnosis not present

## 2020-11-28 DIAGNOSIS — Z85828 Personal history of other malignant neoplasm of skin: Secondary | ICD-10-CM | POA: Diagnosis not present

## 2020-11-28 DIAGNOSIS — N183 Chronic kidney disease, stage 3 unspecified: Secondary | ICD-10-CM | POA: Diagnosis not present

## 2020-11-28 MED ORDER — OXYCODONE HCL 5 MG PO TABS: 5.0000 mg | ORAL_TABLET | Freq: Four times a day (QID) | ORAL | 0 refills | Status: DC | PRN

## 2020-11-28 NOTE — Discharge Instructions (Addendum)
If you develop redness or heat to the joint, new or worsening pain, fevers, or any other new/concerning symptoms then return to the ER for evaluation.

## 2020-11-28 NOTE — ED Notes (Signed)
Doppler at bedside.

## 2020-11-28 NOTE — ED Provider Notes (Signed)
[facility] DEPT Provider Note   CSN: [id number] Arrival date & time: 11/28/20  0747     History Chief Complaint  Patient presents with   Ankle Pain    William Mahoney is a 87 y.o. male.  HPI 87 year old male presents with acute left ankle pain.  Started all of a sudden at 2:30 AM and woke him up.  His ankle is a little swollen on the lateral aspect which is where he is hurting.  No redness.  He took 3 Tylenol and his pain is much better.  He denies a fever or systemic symptoms.  He has had gout in his great toe before but never in his ankle.  Past Medical History:  Diagnosis Date   BPH (benign prostatic hypertrophy)    Chronic atrial fibrillation [facility])    cardiologist--   dr berry   COPD (chronic obstructive pulmonary disease) ([place])    Diverticulosis of colon    MODERATE LEFT SIDE   Epididymal cyst    w/ epididymalitis   Full dentures    Hand foot syndrome    secondary to chemotherapy ([place])   cold hands/feet   Hearing loss    History of alcohol abuse    quit drinking in the mid 80's   History of cirrhosis of liver    alcoholic--  hx alcohol abuse -- quit drinking 1980's   History of gout    History of melanoma excision    2013--  left ear lobe and back of hand   History of pancreatitis    2008   History of rectal cancer oncologist-  dr Benay Spice--  no recurrence   dx Sept 2009--  Stage II (T3N0)  s/p  sigmoid colectomy & low anterior resection 04-18-2008  and chemoradiation 2010   History of shingles 07/27/2010   History of squamous cell carcinoma excision    left lower leg   Hyperlipidemia    Hypertension    Loose stools    DUE TO ANTIBIOTICS   Macular degeneration    not sure which eye   OA (osteoarthritis)    RBBB (right bundle branch block)    Renal lesion    chronic-- left side   Sciatica of right side    Urge urinary incontinence    intermittant    Patient Active Problem List   Diagnosis Date Noted   AAA (abdominal aortic  aneurysm) without rupture ([place]) 11/20/2019   PAD (peripheral artery disease) ([place]) 11/20/2019   Chronic kidney disease (CKD), stage III (moderate) ([place]) 02/15/2019   Aortic atherosclerosis ([place]) 01/05/2019   Thrombocytopenia ([place]) 11/10/2016   Edema 11/10/2016   Insomnia 10/19/2014   Encounter for therapeutic drug monitoring 08/27/2014   Gout 01/09/2014   Allergic rhinitis 08/15/2013   Arthritis of lumbar spine 01/20/2013   Inguinal hernia 03/21/2010   BASAL CELL CARCINOMA SKIN LOWER LIMB INCL HIP 03/21/2010   Atrial fibrillation ([place]) 12/07/2008   Actinic keratosis 12/07/2008   History of cancer of rectosigmoid junction 01/04/2008   Former smoker 06/23/2007   Hyperlipidemia, unspecified 02/09/2007   TMJ (temporomandibular joint disorder) 02/09/2007   Essential hypertension 12/07/2006   COPD (chronic obstructive pulmonary disease) ([place]) 12/07/2006   BPH (benign prostatic hyperplasia) 12/07/2006   URINARY INCONTINENCE 12/07/2006   PANCREATITIS, HX OF 12/07/2006    Past Surgical History:  Procedure Laterality Date   CARDIAC CATHETERIZATION  2001  approx.  in [place], BILATERAL     COLONOSCOPY  last one 06-27-2012   EXPLORATORY LAPAROTOMY/  LOW ANTERIOR RESECTION/  SIGMOID COLECTOMY  04-18-2008   DR INGRAM   INCISION AND DRAINAGE ABSCESS N/A 11/02/2014   Procedure: INCISION AND DRAINAGE OF SCROTUM;  Surgeon: Ardis Hughs, MD;  Location: [facility];  Service: Urology;  Laterality: N/A;   INGUINAL HERNIA REPAIR Right 03/23/2014   Procedure: HERNIA REPAIR INGUINAL ADULT OPEN REPAIR RIGHT INGUINAL HERNIA REPAIR;  Surgeon: Fanny Skates, MD;  Location: [facility];  Service: General;  Laterality: Right;   INSERTION OF MESH Right 03/23/2014   Procedure: INSERTION OF MESH;  Surgeon: Fanny Skates, MD;  Location: [facility];  Service: General;  Laterality: Right;   LAPAROSCOPIC CHOLECYSTECTOMY  1990's   MASS EXCISION N/A  11/02/2014   Procedure: EXCISION OF EPIDIDYMAL CYST;  Surgeon: Ardis Hughs, MD;  Location[facility];  Service: Urology;  Laterality: N/A;   TONSILLECTOMY  as child   TRANSTHORACIC ECHOCARDIOGRAM  05-11-2008   pseudonormal LV filling pattern,  ef 60-65%/  mild to moderate MV calcification no stenosis w/ mild to moderate regurg./  mild LAE and RAE/  mild TR       Family History  Problem Relation Age of Onset   Hypertension Mother    Lymphoma Father    Lymphoma Other    Stroke Other        1st degree relative   Colon cancer Neg Hx    Esophageal cancer Neg Hx    Rectal cancer Neg Hx    Stomach cancer Neg Hx     Social History   Tobacco Use   Smoking status: Former    Packs/day: 1.00    Years: 55.00    Pack years: 55.00    Types: Cigarettes    Quit date: 03/20/2008    Years since quitting: 12.7   Smokeless tobacco: Never  Vaping Use   Vaping Use: Never used  Substance Use Topics   Alcohol use: No    Comment: hx alcohol abuse -- quit in 1980's    Drug use: No    Home Medications Prior to Admission medications   Medication Sig Start Date End Date Taking? Authorizing Provider  oxyCODONE (ROXICODONE) 5 MG immediate release tablet Take 1 tablet (5 mg total) by mouth every 6 (six) hours as needed for breakthrough pain or severe pain. 11/28/20  Yes Sherwood Gambler, MD  allopurinol (ZYLOPRIM) 300 MG tablet TAKE ONE-HALF TABLET BY MOUTH IN THE EVENING 10/10/20   Marin Olp, MD  amLODipine (NORVASC) 2.5 MG tablet TAKE 1 TABLET BY MOUTH EVERY DAY 09/17/20   Marin Olp, MD  colchicine 0.6 MG tablet Take 0.6 mg by mouth 2 (two) times daily as needed (gout).    [provider]  fish oil-omega-3 fatty acids 1000 MG capsule Take 1 g by mouth daily.    [provider]  fluticasone (FLONASE) 50 MCG/ACT nasal spray Place 2 sprays into both nostrils daily. 03/30/19   Marin Olp, MD  furosemide (LASIX) 20 MG tablet TAKE 1 TABLET BY  MOUTH EVERY DAY AS NEEDED 08/15/20   Marin Olp, MD  metoprolol succinate (TOPROL-XL) 25 MG 24 hr tablet Take 0.5 tablets (12.5 mg total) by mouth daily. 03/30/19   Marin Olp, MD  metoprolol tartrate (LOPRESSOR) 25 MG tablet Take 12.5 mg by mouth 2 (two) times daily. 09/14/20   [provider]  Multiple Vitamin (MULTIVITAMIN) tablet Take 1 tablet by mouth daily.    [provider]  tamsulosin (FLOMAX) 0.4 MG CAPS capsule TAKE 1 CAPSULE BY MOUTH EVERY DAY 10/03/20   Marin Olp, MD  triamterene-hydrochlorothiazide [facility]) 37.5-25 MG tablet TAKE 1/2 TABLET BY MOUTH DAILY 08/26/20   Marin Olp, MD  warfarin (COUMADIN) 2.5 MG tablet TAKE 1 TABLET DAILY EXCEPT TAKE 1 AND 1/2 TABLETS ON WED AND SAT OR TAKE AS DIRECTED BY CLINIC 05/20/20   Marin Olp, MD  zolpidem (AMBIEN) 5 MG tablet Take 1 tablet (5 mg total) by mouth at bedtime as needed for sleep. 06/20/20   Marin Olp, MD    Allergies    Patient has no known allergies.  Review of Systems   Review of Systems  Constitutional:  Negative for fever.  Musculoskeletal:  Positive for arthralgias and joint swelling.  Skin:  Negative for color change.   Physical Exam Updated Vital Signs BP (!) 143/74 (BP Location: Right Arm)   Pulse (!) 58   Temp 98.5 F (36.9 C) (Oral)   Resp 18   Ht '5\' 10"'$  (1.778 m)   Wt 72.6 kg   SpO2 100%   BMI 22.96 kg/m   Physical Exam Vitals and nursing note reviewed.  Constitutional:      Appearance: He is well-developed.  HENT:     Head: Normocephalic and atraumatic.     Right Ear: External ear normal.     Left Ear: External ear normal.     Nose: Nose normal.  Eyes:     General:        Right eye: No discharge.        Left eye: No discharge.  Cardiovascular:     Rate and Rhythm: Normal rate and regular rhythm.     Pulses:          Dorsalis pedis pulses are detected w/ Doppler on the left side.  Pulmonary:     Effort: Pulmonary effort is normal.   Abdominal:     General: There is no distension.  Musculoskeletal:     Left lower leg: No tenderness.     Left ankle: Swelling (mild, lateral) present. Tenderness present over the lateral malleolus (mild). Normal range of motion.     Left Achilles Tendon: No tenderness.     Left foot: No swelling or tenderness.     Comments: No erythema over left ankle  Skin:    General: Skin is warm and dry.  Neurological:     Mental Status: He is alert.  Psychiatric:        Mood and Affect: Mood is not anxious.    ED Results / Procedures / Treatments   Labs (all labs ordered are listed, but only abnormal results are displayed) Labs Reviewed - No data to display  EKG None  Radiology DG Ankle Complete Left  Result Date: 11/28/2020 CLINICAL DATA:  Acute lateral ankle pain.  No known injury. EXAM: LEFT ANKLE COMPLETE - 3+ VIEW COMPARISON:  09/13/2007 FINDINGS: Mild bony demineralization. No acute fracture or dislocation. Ankle mortise is congruent. No appreciable tibiotalar joint effusion. Mild talonavicular arthropathy with dorsal spurring. Small plantar calcaneal spur. Scattered vascular calcifications. No focal soft tissue swelling. IMPRESSION: No acute osseous abnormality, left ankle. Electronically Signed   By: Davina Poke D.O.   On: 11/28/2020 08:44    Procedures Procedures   Medications Ordered in ED Medications - No data to display  ED Course  I have reviewed the triage vital signs and the nursing notes.  Pertinent labs & imaging  results that were available during my care of the patient were reviewed by me and considered in my medical decision making (see chart for details).    MDM Rules/Calculators/A&P                           Based on initial history I was most concerned for gout, which patient has had before.  However there are some things that do not quite line up like he has pretty good range of motion without pain and no redness or heat to the joint.  It could just be  that is very early.  I discussed that he could start his colchicine but otherwise I would just do supportive care and see which way this goes.  I doubt acute infection.  There is no obvious findings on x-ray.  No other swelling in his leg to suggest DVT and he has a good pulse.  We will give oxycodone for breakthrough pain as it seemed like he was in severe pain prior to coming in.  However we discussed that this is to be used only if Tylenol does not work. Final Clinical Impression(s) / ED Diagnoses Final diagnoses:  Left ankle swelling    Rx / DC Orders ED Discharge Orders          Ordered    oxyCODONE (ROXICODONE) 5 MG immediate release tablet  Every 6 hours PRN        11/28/20 0857             Sherwood Gambler, MD 11/28/20 0901

## 2020-11-28 NOTE — ED Triage Notes (Signed)
Pt arrives to ED with c/o of left ankle pain. Pt reports pain started at 2am last night when the pain awoke him from sleep. Pt reports tylenol helped but pain has returned. Pt reports swelling to left ankle yesterday but experienced no pain. No recent known injury to ankle.

## 2020-11-29 ENCOUNTER — Other Ambulatory Visit: Payer: Self-pay

## 2020-11-29 ENCOUNTER — Encounter: Payer: Self-pay | Admitting: Family Medicine

## 2020-11-29 ENCOUNTER — Telehealth: Payer: Self-pay

## 2020-11-29 MED ORDER — ALLOPURINOL 300 MG PO TABS: ORAL_TABLET | ORAL | 1 refills | Status: DC

## 2020-11-29 MED ORDER — COLCHICINE 0.6 MG PO TABS: 0.6000 mg | ORAL_TABLET | Freq: Two times a day (BID) | ORAL | 2 refills | Status: DC | PRN

## 2020-11-29 NOTE — Telephone Encounter (Signed)
Okay to refill? 

## 2020-11-29 NOTE — Telephone Encounter (Signed)
Refill sent.

## 2020-11-29 NOTE — Telephone Encounter (Signed)
Patient's daughter is calling in stating that Deni was seen in the ED yesterday and was told that he has gout and was prescribed Oxycodone which isnt helping. Eduardo asked if he can get a refill on gout medication.

## 2020-12-02 NOTE — Telephone Encounter (Signed)
Patient called in stating that the medication was sent in but there is no instructions on how often he needs to take it. Wondering if someone can give him a call to review this with him.

## 2020-12-02 NOTE — Telephone Encounter (Signed)
Patient has been scheduled

## 2020-12-03 ENCOUNTER — Encounter: Payer: Self-pay | Admitting: Family Medicine

## 2020-12-03 ENCOUNTER — Ambulatory Visit: Payer: Medicare Other | Admitting: Family Medicine

## 2020-12-03 VITALS — BP 134/83 | HR 64 | Temp 97.8°F | Ht 71.0 in | Wt 153.8 lb

## 2020-12-03 DIAGNOSIS — I10 Essential (primary) hypertension: Secondary | ICD-10-CM

## 2020-12-03 DIAGNOSIS — M1A9XX0 Chronic gout, unspecified, without tophus (tophi): Secondary | ICD-10-CM | POA: Diagnosis not present

## 2020-12-03 DIAGNOSIS — M25572 Pain in left ankle and joints of left foot: Secondary | ICD-10-CM

## 2020-12-03 DIAGNOSIS — I4891 Unspecified atrial fibrillation: Secondary | ICD-10-CM | POA: Diagnosis not present

## 2020-12-03 MED ORDER — COLCHICINE 0.6 MG PO TABS: ORAL_TABLET | ORAL | 2 refills | Status: DC

## 2020-12-03 NOTE — Progress Notes (Signed)
Phone [phone] In person visit   Subjective:   William Mahoney is a 87 y.o. year old very pleasant male patient who presents for/with See problem oriented charting Chief Complaint  Patient presents with   Gout   This visit occurred during the SARS-CoV-2 public health emergency.  Safety protocols were in place, including screening questions prior to the visit, additional usage of staff PPE, and extensive cleaning of exam room while observing appropriate contact time as indicated for disinfecting solutions.   Past Medical History-  Patient Active Problem List   Diagnosis Date Noted   Edema 11/10/2016    Priority: High   Atrial fibrillation ([place]) 12/07/2008    Priority: High   Chronic kidney disease (CKD), stage III (moderate) ([place]) 02/15/2019    Priority: Medium   Insomnia 10/19/2014    Priority: Medium   Gout 01/09/2014    Priority: Medium   History of cancer of rectosigmoid junction 01/04/2008    Priority: Medium   Former smoker 06/23/2007    Priority: Medium   Hyperlipidemia, unspecified 02/09/2007    Priority: Medium   Essential hypertension 12/07/2006    Priority: Medium   COPD (chronic obstructive pulmonary disease) ([place]) 12/07/2006    Priority: Medium   BPH (benign prostatic hyperplasia) 12/07/2006    Priority: Medium   Allergic rhinitis 08/15/2013    Priority: Low   Arthritis of lumbar spine 01/20/2013    Priority: Low   Inguinal hernia 03/21/2010    Priority: Low   BASAL CELL CARCINOMA SKIN LOWER LIMB INCL HIP 03/21/2010    Priority: Low   Actinic keratosis 12/07/2008    Priority: Low   TMJ (temporomandibular joint disorder) 02/09/2007    Priority: Low   URINARY INCONTINENCE 12/07/2006    Priority: Low   PANCREATITIS, HX OF 12/07/2006    Priority: Low   AAA (abdominal aortic aneurysm) without rupture ([place]) 11/20/2019   PAD (peripheral artery disease) ([place]) 11/20/2019   Aortic atherosclerosis ([place]) 01/05/2019   Thrombocytopenia ([place]) 11/10/2016    Encounter for therapeutic drug monitoring 08/27/2014    Medications- reviewed and updated Current Outpatient Medications  Medication Sig Dispense Refill   allopurinol (ZYLOPRIM) 300 MG tablet TAKE ONE-HALF TABLET BY MOUTH IN THE EVENING 45 tablet 1   amLODipine (NORVASC) 2.5 MG tablet TAKE 1 TABLET BY MOUTH EVERY DAY 90 tablet 1   fish oil-omega-3 fatty acids 1000 MG capsule Take 1 g by mouth daily.     fluticasone (FLONASE) 50 MCG/ACT nasal spray Place 2 sprays into both nostrils daily. 48 g 3   furosemide (LASIX) 20 MG tablet TAKE 1 TABLET BY MOUTH EVERY DAY AS NEEDED 90 tablet 1   Multiple Vitamin (MULTIVITAMIN) tablet Take 1 tablet by mouth daily.     oxyCODONE (ROXICODONE) 5 MG immediate release tablet Take 1 tablet (5 mg total) by mouth every 6 (six) hours as needed for breakthrough pain or severe pain. 8 tablet 0   tamsulosin (FLOMAX) 0.4 MG CAPS capsule TAKE 1 CAPSULE BY MOUTH EVERY DAY 90 capsule 1   triamterene-hydrochlorothiazide (MAXZIDE-25) 37.5-25 MG tablet TAKE 1/2 TABLET BY MOUTH DAILY 45 tablet 1   warfarin (COUMADIN) 2.5 MG tablet TAKE 1 TABLET DAILY EXCEPT TAKE 1 AND 1/2 TABLETS ON WED AND SAT OR TAKE AS DIRECTED BY CLINIC 120 tablet 1   zolpidem (AMBIEN) 5 MG tablet Take 1 tablet (5 mg total) by mouth at bedtime as needed for sleep. 15 tablet 5   colchicine 0.6 MG tablet Take 2 tablets (1.2 mg)  at the first sign of flare, followed by 0.6 mg after 1 hour if pain not resolved. Then take once daily after that until pain resolves. Continue allopurinol during gout flares. 180 tablet 2   metoprolol succinate (TOPROL-XL) 25 MG 24 hr tablet Take 0.5 tablets (12.5 mg total) by mouth daily. (Patient not taking: Reported on 12/03/2020) 45 tablet 3   metoprolol tartrate (LOPRESSOR) 25 MG tablet Take 12.5 mg by mouth 2 (two) times daily. (Patient not taking: Reported on 12/03/2020)     No current facility-administered medications for this visit.     Objective:  BP 134/83   Pulse 64    Temp 97.8 F (36.6 C) (Temporal)   Ht '5\' 11"'$  (1.803 m)   Wt 153 lb 12.8 oz (69.8 kg)   SpO2 99%   BMI 21.45 kg/m  Gen: NAD, resting comfortably CV: regular rate no murmurs rubs or gallops Lungs: CTAB no crackles, wheeze, rhonchi Abdomen: soft/nontender/nondistended/normal bowel sounds. No rebound or guarding.  Ext: no edema on right, edema and very tender to palpation on lateral malleolus on the left. Varicosities both legs. No calf pain. No rash. No significant warmth or erythema above baseline    Assessment and Plan  # Hospital F/U for Left Ankle Swelling/pain S: 87 year old patient presented to the Ed 11/28/2020 with acute left ankle pain when he woke up at 2 30 AM (like someone hit him with sledgehammer). Onset was around 2:30 AM that morning. Denied a fever or any systemic symptoms. Ankle was mildly swollen on the lateral aspect - location of where he was hurting. No erythema was noted. He stated that he took 3 Tylenols and the pain had gotten better. Patient noted having gout in his great toe but never in his ankle.  DG ANKLE COMPLETE LEFT  shows "No acute osseous abnormality, left ankle.". apparently had a bedside ultrasound that was reassuring.   There was concern for gout, however, some findings did not line up as that being a final diagnosis - decent range of motion without pain and no redness or heat to the joint. Patient was advised he could start colchicine but otherwise it would be better for him to just do supportive care and to see how it goes. Oxycodone 5 mg immediate release was prescribed for pain - only to be used if Tylenol does not work.  At baseline patient takes 150 mg of allopurinol daily and uric acid has traditionally been well controlled-last June was 5.8. rx for colchicine was given - but he was confused on directions- mainly just once a day. He does not feel like it is helping. Pain 3-4/10 but was up to 9.5/10 in ER. He has continued allopurinol other than missing  one day. Colchicine was $300 unfortunately. No fall or injruy. Oxycodone makes him somewhat confused- advised to stop Lab Results  Component Value Date   LABURIC 5.8 10/05/2019  A/P: 87 year old male  with history of gout with acute pain that woke him up at night in left ankle- no significant warmth or redness but was very painful and tender to touch. Not improving substantially with colchicine as gout has in the past for him.  - I am not sure what is causing this- I would like to get sports medicines opinion - will place urgent referral  - I do not suspect DVT- no calf pain and no history of clot- does have edema in lower leg but pronounced just around ankle - plus on coumadin so low  risk of clot. Also no shortness of breath to suggest PE above baseline with COPD -amlodipine could contribute to edema but should not cause this level of pain  -history of edema that lasix helps but that has bene for bilateral edema.  - considered prednisone- but wants to start with sports medicine to see what underlying diagnosis is- we discussed this may help if gout or pseudogout  # Atrial fibrillation S: Rate controlled with metoprolol 25 mg ER - takes half a tablet daily per report  Anticoagulated with coumadin through [facility] clinic  Patient is followed by cardiology: Dr. Gwenlyn Found  A/P: Stable. Continue current medications.   - see discussion about anticoagulation lowering clot risk above  #hypertension S: medication: Maxzide 37.5-25 mg,  amlodipine 2.5 mg, metoprolol 12.5 mg XR BID BP Readings from Last 3 Encounters:  12/03/20 134/83  11/28/20 (!) 149/77  11/06/20 (!) 143/64  A/P: Stable. Continue current medications.  #family asks about trazodone- failed in 2019. We opted to hold off for now especially with acute pain. Advised to avoid tylenol PM  Recommended follow up: keep October visit Future Appointments  Date Time Provider [place]  12/25/2020 11:00 AM [facility]   02/14/2021  3:40 PM Marin Olp, MD [facility]  03/24/2021  3:15 PM Gardiner Barefoot, DPM [facility]    Lab/Order associations:   ICD-10-CM   1. Acute left ankle pain  M25.572 Ambulatory referral to Sports Medicine    CANCELED: Ambulatory referral to Sports Medicine    2. Chronic gout without tophus, unspecified cause, unspecified site  [id number].[id number]     3. Atrial fibrillation, unspecified type ([place])  I48.91     4. Essential hypertension  I10       Meds ordered this encounter  Medications   colchicine 0.6 MG tablet    Sig: Take 2 tablets (1.2 mg) at the first sign of flare, followed by 0.6 mg after 1 hour if pain not resolved. Then take once daily after that until pain resolves. Continue allopurinol during gout flares.    Dispense:  180 tablet    Refill:  2    I,Harris Phan,acting as a scribe for [name], MD.,have documented all relevant documentation on the behalf of [name], MD,as directed by  [name], MD while in the presence of [name], MD.  I, [name], MD, have reviewed all documentation for this visit. The documentation on 12/03/20 for the exam, diagnosis, procedures, and orders are all accurate and complete.   Return precautions advised.  [name], MD

## 2020-12-03 NOTE — Patient Instructions (Addendum)
Health Maintenance Due  Topic Date Due   INFLUENZA VACCINE Not available in the office YET. Please consider getting your flu shot in the Fall. If you get this outside of our office, please let us know. 11/18/2020   Please continue to take Allopurinol during gout flares!  Try voltaren gel topically over the counter. Tylenol as needed ok- no tylenol PM  Team please let our referrer know about urgent sports medicine referral. Want to have you seen this week. If it is going to be longer then a week when they tell you about appointment- lets reconsider the prednisone  Recommended follow up: Please keep your October visit.

## 2020-12-03 NOTE — Telephone Encounter (Signed)
Pt is scheduled for 12/03/2020.

## 2020-12-04 ENCOUNTER — Other Ambulatory Visit: Payer: Self-pay

## 2020-12-04 ENCOUNTER — Encounter: Payer: Self-pay | Admitting: Family Medicine

## 2020-12-04 ENCOUNTER — Ambulatory Visit (INDEPENDENT_AMBULATORY_CARE_PROVIDER_SITE_OTHER): Payer: Medicare Other | Admitting: Family Medicine

## 2020-12-04 ENCOUNTER — Ambulatory Visit: Payer: Self-pay

## 2020-12-04 VITALS — BP 110/68 | HR 52 | Ht 71.0 in | Wt 154.4 lb

## 2020-12-04 DIAGNOSIS — M25572 Pain in left ankle and joints of left foot: Secondary | ICD-10-CM | POA: Diagnosis not present

## 2020-12-04 DIAGNOSIS — M1A9XX0 Chronic gout, unspecified, without tophus (tophi): Secondary | ICD-10-CM

## 2020-12-04 LAB — BASIC METABOLIC PANEL
BUN: 22 mg/dL (ref 6–23)
CO2: 30 mEq/L (ref 19–32)
Calcium: 9.6 mg/dL (ref 8.4–10.5)
Chloride: 101 mEq/L (ref 96–112)
Creatinine, Ser: 1.22 mg/dL (ref 0.40–1.50)
GFR: 51.24 mL/min — ABNORMAL LOW (ref >60.00)
Glucose, Bld: 103 mg/dL — ABNORMAL HIGH (ref 70–99)
Potassium: 3.4 mEq/L — ABNORMAL LOW (ref 3.5–5.1)
Sodium: 140 mEq/L (ref 135–145)

## 2020-12-04 LAB — URIC ACID: Uric Acid, Serum: 5.8 mg/dL (ref 4.0–7.8)

## 2020-12-04 NOTE — Patient Instructions (Signed)
Thank you for coming in today.   Please get labs today before you leave   If not improving next step is injection.  I could do that in 1 week if needed.   Take the colchicine 1 or 2x daily for about a week if the ankle still hurts.  Stop taking the colchicine when the ankle stops hurting.   Continue the allopurinol to prevent gout attacks.

## 2020-12-04 NOTE — Progress Notes (Signed)
    Subjective:    CC: L ankle pain  I, Molly Weber, LAT, ATC, am serving as scribe for Dr. Lynne Leader.  HPI: Pt is a 87 y/o male presenting w/ acute-onset L ankle pain that began on 11/28/20.  He was seen at the [facility] ED on 11/28/20 and again by his PCP on 12/03/20.  He has a hx of gout but allopurinol and colchicine medications do not seem to be controlling his symptoms and colchicine is prohibitively expensive for him to con't to use.  Today, pt reports that his L ankle pain has improved and describes it more like a dull ache at this point.  He locates his pain to his L ankle w/ no radiating pain noted.  Patient notes his symptoms are improving after starting colchicine.  He feels pretty well now with only mild pain.  L ankle swelling: yes (L distal lower leg and ankle) Aggravating factors: increased pain at night; transitioning from sit-to-stand Treatments tried: Allopurinol, colchicine, oxycodone, Tylenol,   Diagnostic imaging: L ankle XR- 11/28/20  Pertinent review of Systems: No fevers or chills  Relevant historical information: History of gout.  Typically well controlled with allopurinol.  Recently started on colchicine and feeling better.   Objective:    Vitals:   12/04/20 0943  BP: 110/68  Pulse: (!) 52  SpO2: 100%   General: Well Developed, well nourished, and in no acute distress.   MSK: Left ankle mild effusion.  Mildly tender palpation ATFL region. Slight decreased ankle motion.  Lab and Radiology Results    EXAM: LEFT ANKLE COMPLETE - 3+ VIEW   COMPARISON:  09/13/2007   FINDINGS: Mild bony demineralization. No acute fracture or dislocation. Ankle mortise is congruent. No appreciable tibiotalar joint effusion. Mild talonavicular arthropathy with dorsal spurring. Small plantar calcaneal spur. Scattered vascular calcifications. No focal soft tissue swelling.   IMPRESSION: No acute osseous abnormality, left ankle.     Electronically Signed    By: Davina Poke D.O.   On: 11/28/2020 08:44 I, Lynne Leader, personally (independently) visualized and performed the interpretation of the images attached in this note.   Impression and Recommendations:    Assessment and Plan: 87 y.o. male with left ankle pain and swelling.  Pain occurred spontaneously last week and was severe.  X-ray another evaluation so far has been unrevealing.  He does have a history of gout.  His history of this current episode is suspicious for gout flare.  He is improving with colchicine which also supports gout.  Discussed options.  The natural next step at this situation would be aspiration and injection.  However he notes he is doing well enough that he would like to avoid that.  I think it is reasonable.  Plan to check uric acid and metabolic panel.  Continue colchicine for about another week.  If still not doing well return to clinic and we will proceed with aspiration and injection if needed.  PDMP not reviewed this encounter. Orders Placed This Encounter  Procedures   Uric acid    Standing Status:   Future    Number of Occurrences:   1    Standing Expiration Date:   123XX123   Basic Metabolic Panel (BMET)   No orders of the defined types were placed in this encounter.   Discussed warning signs or symptoms. Please see discharge instructions. Patient expresses understanding.   The above documentation has been reviewed and is accurate and complete Lynne Leader, M.D.

## 2020-12-05 NOTE — Progress Notes (Signed)
Uric acid level is normal.  Kidney function is not much changed from a year ago.

## 2020-12-11 ENCOUNTER — Ambulatory Visit: Payer: Medicare Other | Admitting: Family Medicine

## 2020-12-13 ENCOUNTER — Other Ambulatory Visit: Payer: Self-pay | Admitting: Family Medicine

## 2020-12-13 DIAGNOSIS — Z5181 Encounter for therapeutic drug level monitoring: Secondary | ICD-10-CM

## 2020-12-16 ENCOUNTER — Telehealth: Payer: Self-pay

## 2020-12-16 NOTE — Telephone Encounter (Signed)
Team I would recommend follow-up with [facility] at this point for their expert opinion-ultrasound showed stability but if he feels like it is worsening I would like for them to evaluate in person.  When I last spoke to him he reported his pain was improving-would like to hold off on antibiotics unless pain is worsening or other new or worsening symptoms

## 2020-12-16 NOTE — Telephone Encounter (Signed)
Called and spoke with pt and below message given. 

## 2020-12-16 NOTE — Telephone Encounter (Signed)
Pt states his testicle is getting bigger and he states that he saw another provider a few months ago and got a shot in the arm and placed on antibiotics and then he saw you a few weeks later and you agreed that the testicle was getting bigger. Pt wants to know if he can get another round of antibiotics or what he should do to so it wont keep getting bigger.

## 2020-12-16 NOTE — Telephone Encounter (Signed)
Pt called in requesting Dr Yong Channel to call him in some antibiotics. He stated that he would like a message to go back first to see what Dr Yong Channel wants to do. Please Advise.

## 2020-12-21 ENCOUNTER — Ambulatory Visit (INDEPENDENT_AMBULATORY_CARE_PROVIDER_SITE_OTHER): Payer: Medicare Other | Admitting: *Deleted

## 2020-12-21 DIAGNOSIS — Z0000 Encounter for general adult medical examination without abnormal findings: Secondary | ICD-10-CM

## 2020-12-21 NOTE — Patient Instructions (Signed)
William Mahoney , Thank you for taking time to come for your Medicare Wellness Visit. I appreciate your ongoing commitment to your health goals. Please review the following plan we discussed and let me know if I can assist you in the future.   As requested Urologist - [facility] [place], [place]  Screening recommendations/referrals: Recommended yearly ophthalmology/optometry visit for glaucoma screening and checkup Recommended yearly dental visit for hygiene and checkup  Vaccinations: Influenza vaccine: Due Pneumococcal vaccine: up to date Tdap vaccine: up to date  Shingles vaccine: Declined    Covid-19: up to date   Advanced directives: Please bring a copy of your Medical Advance Directives and Living Will to your primary care office    Next appointment: Follow up in one year for your annual wellness visit.   Preventive Care 66 Years and Older, Male Preventive care refers to lifestyle choices and visits with your health care provider that can promote health and wellness. What does preventive care include? A yearly physical exam. This is also called an annual well check. Dental exams once or twice a year. Routine eye exams. Ask your health care provider how often you should have your eyes checked. Personal lifestyle choices, including: Daily care of your teeth and gums. Regular physical activity. Eating a healthy diet. Avoiding tobacco and drug use. Limiting alcohol use. Practicing safe sex. Taking low doses of aspirin every day. Taking vitamin and mineral supplements as recommended by your health care provider. What happens during an annual well check? The services and screenings done by your health care provider during your annual well check will depend on your age, overall health, lifestyle risk factors, and family history of disease. Counseling  Your health care provider may ask you questions about your: Alcohol use. Tobacco use. Drug  use. Emotional well-being. Home and relationship well-being. Sexual activity. Eating habits. History of falls. Memory and ability to understand (cognition). Work and work Statistician. Screening  You may have the following tests or measurements: Height, weight, and BMI. Blood pressure. Lipid and cholesterol levels. These may be checked every 5 years, or more frequently if you are over 26 years old. Skin check. Lung cancer screening. You may have this screening every year starting at age 52 if you have a 30-pack-year history of smoking and currently smoke or have quit within the past 15 years. Fecal occult blood test (FOBT) of the stool. You may have this test every year starting at age 3. Flexible sigmoidoscopy or colonoscopy. You may have a sigmoidoscopy every 5 years or a colonoscopy every 10 years starting at age 68. Prostate cancer screening. Recommendations will vary depending on your family history and other risks. Hepatitis C blood test. Hepatitis B blood test. Sexually transmitted disease (STD) testing. Diabetes screening. This is done by checking your blood sugar (glucose) after you have not eaten for a while (fasting). You may have this done every 1-3 years. Abdominal aortic aneurysm (AAA) screening. You may need this if you are a current or former smoker. Osteoporosis. You may be screened starting at age 6 if you are at high risk. Talk with your health care provider about your test results, treatment options, and if necessary, the need for more tests. Vaccines  Your health care provider may recommend certain vaccines, such as: Influenza vaccine. This is recommended every year. Tetanus, diphtheria, and acellular pertussis (Tdap, Td) vaccine. You may need a Td booster every 10 years. Zoster vaccine. You may need this after age 43. Pneumococcal 13-valent conjugate (  PCV13) vaccine. One dose is recommended after age 69. Pneumococcal polysaccharide (PPSV23) vaccine. One dose is  recommended after age 38. Talk to your health care provider about which screenings and vaccines you need and how often you need them. This information is not intended to replace advice given to you by your health care provider. Make sure you discuss any questions you have with your health care provider. Document Released: 05/03/2015 Document Revised: 12/25/2015 Document Reviewed: 02/05/2015 Elsevier Interactive Patient Education  2017 [employer] Prevention in the Home Falls can cause injuries. They can happen to people of all ages. There are many things you can do to make your home safe and to help prevent falls. What can I do on the outside of my home? Regularly fix the edges of walkways and driveways and fix any cracks. Remove anything that might make you trip as you walk through a door, such as a raised step or threshold. Trim any bushes or trees on the path to your home. Use bright outdoor lighting. Clear any walking paths of anything that might make someone trip, such as rocks or tools. Regularly check to see if handrails are loose or broken. Make sure that both sides of any steps have handrails. Any raised decks and porches should have guardrails on the edges. Have any leaves, snow, or ice cleared regularly. Use sand or salt on walking paths during winter. Clean up any spills in your garage right away. This includes oil or grease spills. What can I do in the bathroom? Use night lights. Install grab bars by the toilet and in the tub and shower. Do not use towel bars as grab bars. Use non-skid mats or decals in the tub or shower. If you need to sit down in the shower, use a plastic, non-slip stool. Keep the floor dry. Clean up any water that spills on the floor as soon as it happens. Remove soap buildup in the tub or shower regularly. Attach bath mats securely with double-sided non-slip rug tape. Do not have throw rugs and other things on the floor that can make you  trip. What can I do in the bedroom? Use night lights. Make sure that you have a light by your bed that is easy to reach. Do not use any sheets or blankets that are too big for your bed. They should not hang down onto the floor. Have a firm chair that has side arms. You can use this for support while you get dressed. Do not have throw rugs and other things on the floor that can make you trip. What can I do in the kitchen? Clean up any spills right away. Avoid walking on wet floors. Keep items that you use a lot in easy-to-reach places. If you need to reach something above you, use a strong step stool that has a grab bar. Keep electrical cords out of the way. Do not use floor polish or wax that makes floors slippery. If you must use wax, use non-skid floor wax. Do not have throw rugs and other things on the floor that can make you trip. What can I do with my stairs? Do not leave any items on the stairs. Make sure that there are handrails on both sides of the stairs and use them. Fix handrails that are broken or loose. Make sure that handrails are as long as the stairways. Check any carpeting to make sure that it is firmly attached to the stairs. Fix any carpet that is  loose or worn. Avoid having throw rugs at the top or bottom of the stairs. If you do have throw rugs, attach them to the floor with carpet tape. Make sure that you have a light switch at the top of the stairs and the bottom of the stairs. If you do not have them, ask someone to add them for you. What else can I do to help prevent falls? Wear shoes that: Do not have high heels. Have rubber bottoms. Are comfortable and fit you well. Are closed at the toe. Do not wear sandals. If you use a stepladder: Make sure that it is fully opened. Do not climb a closed stepladder. Make sure that both sides of the stepladder are locked into place. Ask someone to hold it for you, if possible. Clearly mark and make sure that you can  see: Any grab bars or handrails. First and last steps. Where the edge of each step is. Use tools that help you move around (mobility aids) if they are needed. These include: Canes. Walkers. Scooters. Crutches. Turn on the lights when you go into a dark area. Replace any light bulbs as soon as they burn out. Set up your furniture so you have a clear path. Avoid moving your furniture around. If any of your floors are uneven, fix them. If there are any pets around you, be aware of where they are. Review your medicines with your doctor. Some medicines can make you feel dizzy. This can increase your chance of falling. Ask your doctor what other things that you can do to help prevent falls. This information is not intended to replace advice given to you by your health care provider. Make sure you discuss any questions you have with your health care provider. Document Released: 01/31/2009 Document Revised: 09/12/2015 Document Reviewed: 05/11/2014 Elsevier Interactive Patient Education  2017 [employer].

## 2020-12-21 NOTE — Progress Notes (Signed)
Subjective:   William Mahoney is a 87 y.o. male who presents for Medicare Annual/Subsequent preventive examination.  Virtual Visit via Telephone Note  I connected with  [name] on 12/21/20 at 10:00 AM EDT by telephone and verified that I am speaking with the correct person using two identifiers.  Location: Patient: home with wife Provider: office Persons participating in the virtual visit: patient/Nurse Health Advisor   I discussed the limitations, risks, security and privacy concerns of performing an evaluation and management service by telephone and the availability of in person appointments. The patient expressed understanding and agreed to proceed.   We continued and completed visit with audio only.  Some vital signs may be absent or patient reported.   Massie Maroon, CMA   Review of Systems     Cardiac Risk Factors include: advanced age (>23mn, >>72women);male gender     Objective:    There were no vitals filed for this visit. There is no height or weight on file to calculate BMI.  Advanced Directives 12/21/2020 11/28/2020 12/09/2018 11/18/2018 01/09/2018 06/17/2017 11/02/2014  Does Patient Have a Medical Advance Directive? Yes Yes No No;Yes No No No  Type of Advance Directive Living will [place] - [place] will - - -  Does patient want to make changes to medical advance directive? - No - Patient declined - No - Patient declined - - -  Copy of [place]? No - copy requested - - No - copy requested - - -  Would patient like information on creating a medical advance directive? - - No - Patient declined - No - Patient declined - Yes - Educational materials given    Current Medications (verified) Outpatient Encounter Medications as of 12/21/2020  Medication Sig   allopurinol (ZYLOPRIM) 300 MG tablet TAKE ONE-HALF TABLET BY MOUTH IN THE EVENING (Patient taking differently: TAKE  ONE-HALF TABLET BY MOUTH IN THE afternoon)   amLODipine (NORVASC) 2.5 MG tablet Take 2.5 mg by mouth daily. 1/2 tablet daily   colchicine 0.6 MG tablet Take 2 tablets (1.2 mg) at the first sign of flare, followed by 0.6 mg after 1 hour if pain not resolved. Then take once daily after that until pain resolves. Continue allopurinol during gout flares.   fluticasone (FLONASE) 50 MCG/ACT nasal spray Place 2 sprays into both nostrils daily.   furosemide (LASIX) 20 MG tablet TAKE 1 TABLET BY MOUTH EVERY DAY AS NEEDED   metoprolol tartrate (LOPRESSOR) 25 MG tablet TAKE 1/2 TABLET BY MOUTH TWICE A DAY   Multiple Vitamin (MULTIVITAMIN) tablet Take 1 tablet by mouth daily.   oxyCODONE (ROXICODONE) 5 MG immediate release tablet Take 1 tablet (5 mg total) by mouth every 6 (six) hours as needed for breakthrough pain or severe pain.   tamsulosin (FLOMAX) 0.4 MG CAPS capsule TAKE 1 CAPSULE BY MOUTH EVERY DAY   triamterene-hydrochlorothiazide (MAXZIDE-25) 37.5-25 MG tablet TAKE 1/2 TABLET BY MOUTH DAILY   warfarin (COUMADIN) 2.5 MG tablet TAKE 1 TABLET DAILY EXCEPT TAKE 1 AND 1/2 TABLETS ON WED AND SAT OR TAKE AS DIRECTED BY CLINIC   zolpidem (AMBIEN) 5 MG tablet Take 1 tablet (5 mg total) by mouth at bedtime as needed for sleep. (Patient taking differently: Take 2.5 mg by mouth at bedtime as needed for sleep.)   [DISCONTINUED] amLODipine (NORVASC) 2.5 MG tablet TAKE 1 TABLET BY MOUTH EVERY DAY (Patient taking differently: Reports taking 1/2 tablet daily)   [DISCONTINUED]  metoprolol succinate (TOPROL-XL) 25 MG 24 hr tablet Take 0.5 tablets (12.5 mg total) by mouth daily.   [DISCONTINUED] fish oil-omega-3 fatty acids 1000 MG capsule Take 1 g by mouth daily.   No facility-administered encounter medications on file as of 12/21/2020.    Allergies (verified) Patient has no known allergies.   History: Past Medical History:  Diagnosis Date   BPH (benign prostatic hypertrophy)    Chronic atrial fibrillation [facility])     cardiologist--   dr berry   COPD (chronic obstructive pulmonary disease) ([place])    Diverticulosis of colon    MODERATE LEFT SIDE   Epididymal cyst    w/ epididymalitis   Full dentures    Gout    Hand foot syndrome    secondary to chemotherapy ([place])   cold hands/feet   Hearing loss    History of alcohol abuse    quit drinking in the mid 80's   History of cirrhosis of liver    alcoholic--  hx alcohol abuse -- quit drinking 1980's   History of gout    History of melanoma excision    2013--  left ear lobe and back of hand   History of pancreatitis    2008   History of rectal cancer oncologist-  dr Benay Spice--  no recurrence   dx Sept 2009--  Stage II (T3N0)  s/p  sigmoid colectomy & low anterior resection 04-18-2008  and chemoradiation 2010   History of shingles 07/27/2010   History of squamous cell carcinoma excision    left lower leg   Hyperlipidemia    Hypertension    Loose stools    DUE TO ANTIBIOTICS   Macular degeneration    not sure which eye   OA (osteoarthritis)    RBBB (right bundle branch block)    Renal lesion    chronic-- left side   Sciatica of right side    Urge urinary incontinence    intermittant   Past Surgical History:  Procedure Laterality Date   CARDIAC CATHETERIZATION  2001  approx.  in [place]  IMPLANT, BILATERAL     COLONOSCOPY  last one 06-27-2012   EXPLORATORY LAPAROTOMY/  LOW ANTERIOR RESECTION/  SIGMOID COLECTOMY  04-18-2008   DR INGRAM   INCISION AND DRAINAGE ABSCESS N/A 11/02/2014   Procedure: INCISION AND DRAINAGE OF SCROTUM;  Surgeon: Ardis Hughs, MD;  Location: [facility];  Service: Urology;  Laterality: N/A;   INGUINAL HERNIA REPAIR Right 03/23/2014   Procedure: HERNIA REPAIR INGUINAL ADULT OPEN REPAIR RIGHT INGUINAL HERNIA REPAIR;  Surgeon: Fanny Skates, MD;  Location: [place];  Service: General;  Laterality: Right;   INSERTION OF MESH Right 03/23/2014   Procedure:  INSERTION OF MESH;  Surgeon: Fanny Skates, MD;  Location: [place];  Service: General;  Laterality: Right;   LAPAROSCOPIC CHOLECYSTECTOMY  1990's   MASS EXCISION N/A 11/02/2014   Procedure: EXCISION OF EPIDIDYMAL CYST;  Surgeon: Ardis Hughs, MD;  Location: [facility];  Service: Urology;  Laterality: N/A;   TONSILLECTOMY  as child   TRANSTHORACIC ECHOCARDIOGRAM  05-11-2008   pseudonormal LV filling pattern,  ef 60-65%/  mild to moderate MV calcification no stenosis w/ mild to moderate regurg./  mild LAE and RAE/  mild TR   Family History  Problem Relation Age of Onset   Hypertension Mother    Lymphoma Father    Lymphoma Other    Stroke Other  1st degree relative   Colon cancer Neg Hx    Esophageal cancer Neg Hx    Rectal cancer Neg Hx    Stomach cancer Neg Hx    Social History   Socioeconomic History   Marital status: Married    Spouse name: Not on file   Number of children: 6   Years of education: Not on file   Highest education level: High school graduate  Occupational History   Occupation: Retired    Fish farm manager: RETIRED  Tobacco Use   Smoking status: Former    Packs/day: 1.00    Years: 55.00    Pack years: 55.00    Types: Cigarettes    Quit date: 03/20/2008    Years since quitting: 12.7   Smokeless tobacco: Never  Vaping Use   Vaping Use: Never used  Substance and Sexual Activity   Alcohol use: No    Comment: hx alcohol abuse -- quit in 1980's    Drug use: No   Sexual activity: Not on file  Other Topics Concern   Not on file  Social History Narrative   Married. 6 kids. Wife Joycelyn Schmid also a patient of Dr. Ronney Lion.       Retired.    Social Determinants of Health   Financial Resource Strain: Not on file  Food Insecurity: Not on file  Transportation Needs: Not on file  Physical Activity: Not on file  Stress: Not on file  Social Connections: Not on file    Tobacco Counseling Pt quit in 1980s   Clinical Intake:  Pre-visit  preparation completed: Yes  Pain : No/denies pain     BMI - recorded:  (weight 157lbs) Nutritional Risks: None Diabetes: No  How often do you need to have someone help you when you read instructions, pamphlets, or other written materials from your doctor or pharmacy?: 1 - Never What is the last grade level you completed in school?: greaduated high school  Diabetic? No   Interpreter Needed?: No  Information entered by :: Marcellous Snarski, CMA   Activities of Daily Living In your present state of health, do you have any difficulty performing the following activities: 12/21/2020 04/05/2020  Hearing? Y N  Comment is considering hearing aids -  Vision? Y N  Comment has glasses and see [facility] in [place] -  Difficulty concentrating or making decisions? N N  Walking or climbing stairs? N N  Dressing or bathing? Y N  Comment some difficulty getting dressed - has walk in shower and rails - has to take it slow when dressing and bathing -  Doing errands, shopping? Y N  Comment lives with daughter who helps with pt and wife -  Conservation officer, nature and eating ? N -  Using the Toilet? N -  In the past six months, have you accidently leaked urine? Y -  Do you have problems with loss of bowel control? Y -  Managing your Medications? N -  Managing your Finances? N -  Housekeeping or managing your Housekeeping? N -  Some recent data might be hidden    Patient Care Team: Marin Olp, MD as PCP - General (Family Medicine) Waynetta Sandy, MD as Consulting Physician (Vascular Surgery) Hortencia Pilar, MD as Consulting Physician (Ophthalmology) Dermatology, [facility] as Consulting Physician  Indicate any recent [place] you may have received from other than [facility] providers in the past year (date may be approximate).     Assessment:   This is a routine wellness  examination for Joshoa.  Hearing/Vision screen Hearing Screening - Comments:: Cannot hear - does  not wear hearing aids but is considering  Vision Screening - Comments:: Cannot see well has macular degeneration in both eyes - wears glasses some - sees ophthalmologist - [facility] in [place] issues and exercise activities discussed: Current Exercise Habits: Home exercise routine, Type of exercise: walking, Time (Minutes): 20, Frequency (Times/Week): 7, Weekly Exercise (Minutes/Week): 140, Exercise limited by: Other - see comments (pt says just old age and arthritis)   Goals Addressed             This Visit's Progress    Activity and Exercise Increased        Notes: Happy that he can walk around the house daily with his wife     Patient Stated   On track    Will try pool exercise or get out with wife Try the y nearby       Depression Screen PHQ 2/9 Scores 12/21/2020 11/06/2020 10/05/2019 03/30/2019 01/05/2019 12/09/2018 11/10/2018  PHQ - 2 Score 2 0 0 0 0 0 0  PHQ- 9 Score 5 - 0 0 - - -    Fall Risk Fall Risk  12/21/2020 11/06/2020 04/05/2020 03/30/2019 01/05/2019  Falls in the past year? 0 0 0 0 0  Number falls in past yr: - 0 0 0 0  Injury with Fall? - 0 0 0 0  Risk for fall due to : Impaired balance/gait No Fall Risks - - -  Follow up Education provided Falls evaluation completed - - -    FALL RISK PREVENTION PERTAINING TO THE HOME:  Any stairs in or around the home? Yes  If so, are there any without handrails? No  Home free of loose throw rugs in walkways, pet beds, electrical cords, etc? Yes  Adequate lighting in your home to reduce risk of falls? Yes   ASSISTIVE DEVICES UTILIZED TO PREVENT FALLS:   Use of a cane, walker or w/c? Yes  Grab bars in the bathroom? Yes  Shower chair or bench in shower? Yes  Elevated toilet seat or a handicapped toilet? No   TIMED UP AND GO:  Was the test performed? No . Phone appointment     Cognitive Function: MMSE - Mini Mental State Exam 06/17/2017  Not completed: (No Data)     6CIT Screen 12/21/2020  What  Year? 0 points  What month? 0 points  What time? 0 points  Count back from 20 0 points  Months in reverse 0 points    Immunizations Immunization History  Administered Date(s) Administered   Fluad Quad(high Dose 65+) 01/05/2019, 12/29/2019   Influenza Split 01/14/2011   Influenza, High Dose Seasonal PF 01/20/2013, 05/21/2017, 03/03/2018   Influenza,inj,Quad PF,6+ Mos 01/09/2014   PFIZER(Purple Top)SARS-COV-2 Vaccination 05/04/2019, 05/22/2019   Pneumococcal Conjugate-13 07/10/2014   Pneumococcal Polysaccharide-23 04/20/2006   Td 11/10/2012   Tdap 01/09/2018    TDAP status: Up to date  Flu Vaccine status: Due, Education has been provided regarding the importance of this vaccine. Advised may receive this vaccine at local pharmacy or Health Dept. Aware to provide a copy of the vaccination record if obtained from local pharmacy or Health Dept. Verbalized acceptance and understanding.  Pneumococcal vaccine status: Up to date  Covid-19 vaccine status: Completed vaccines  Qualifies for Shingles Vaccine? Yes   Zostavax completed No   Shingrix Completed?: No.    Education has been provided regarding the importance of this  vaccine. Patient has been advised to call insurance company to determine out of pocket expense if they have not yet received this vaccine. Advised may also receive vaccine at local pharmacy or Health Dept. Verbalized acceptance and understanding.  Screening Tests Health Maintenance  Topic Date Due   COVID-19 Vaccine (3 - Pfizer risk series) 06/19/2019   INFLUENZA VACCINE  11/18/2020   Zoster Vaccines- Shingrix (1 of 2) 02/06/2021 (Originally 10/12/1944)   TETANUS/TDAP  01/10/2028   PNA vac Low Risk Adult  Completed   HPV VACCINES  Aged Out    Health Maintenance  Health Maintenance Due  Topic Date Due   COVID-19 Vaccine (3 - Pfizer risk series) 06/19/2019   INFLUENZA VACCINE  11/18/2020    Colorectal cancer screening: No longer required.   Lung Cancer  Screening: (Low Dose CT Chest recommended if Age 75-80 years, 30 pack-year currently smoking OR have quit w/in 15years.) does not qualify.   Lung Cancer Screening Referral: not indicated   Additional Screening:   Vision Screening: Recommended annual ophthalmology exams for early detection of glaucoma and other disorders of the eye. Is the patient up to date with their annual eye exam?  Yes  Who is the provider or what is the name of the office in which the patient attends annual eye exams? [place], [place]   Dental Screening: Recommended annual dental exams for proper oral hygiene  Community Resource Referral / Chronic Care Management: CRR required this visit?  No   CCM required this visit?  No      Plan:     I have personally reviewed and noted the following in the patient's chart:   Medical and social history Use of alcohol, tobacco or illicit drugs  Current medications and supplements including opioid prescriptions. Patient is currently taking opioid prescriptions. Information provided to patient regarding non-opioid alternatives. Patient advised to discuss non-opioid treatment plan with their provider. Pt only takes as needed very rarely  Functional ability and status Nutritional status Physical activity Advanced directives List of other physicians Hospitalizations, surgeries, and ER visits in previous 12 months Vitals Screenings to include cognitive, depression, and falls Referrals and appointments  In addition, I have reviewed and discussed with patient certain preventive protocols, quality metrics, and best practice recommendations. A written personalized care plan for preventive services as well as general preventive health recommendations were provided to patient.     Massie Maroon, [place]   12/21/2020   Nurse Notes: none - non face to face 60 minute visit

## 2020-12-23 ENCOUNTER — Telehealth: Payer: Self-pay | Admitting: Family Medicine

## 2020-12-24 ENCOUNTER — Ambulatory Visit: Payer: Medicare Other | Admitting: Family Medicine

## 2020-12-25 ENCOUNTER — Other Ambulatory Visit: Payer: Self-pay

## 2020-12-25 ENCOUNTER — Telehealth: Payer: Self-pay | Admitting: Cardiovascular Disease

## 2020-12-25 ENCOUNTER — Telehealth: Payer: Self-pay

## 2020-12-25 ENCOUNTER — Ambulatory Visit (INDEPENDENT_AMBULATORY_CARE_PROVIDER_SITE_OTHER): Payer: Medicare Other

## 2020-12-25 DIAGNOSIS — Z7901 Long term (current) use of anticoagulants: Secondary | ICD-10-CM | POA: Diagnosis not present

## 2020-12-25 LAB — POCT INR: INR: 2.2 (ref 2.0–3.0)

## 2020-12-25 MED ORDER — METOPROLOL TARTRATE 25 MG PO TABS: 12.5000 mg | ORAL_TABLET | Freq: Every day | ORAL | 1 refills | Status: DC

## 2020-12-25 NOTE — Telephone Encounter (Signed)
Pt c/o medication issue:  1. Name of Medication: metoprolol tartrate (LOPRESSOR) 25 MG tablet  2. How are you currently taking this medication (dosage and times per day)? Half tablet daily  3. Are you having a reaction (difficulty breathing--STAT)? no  4. What is your medication issue? Patient states he was supposed to stop taking the metoprolol tartrate and start taking succinate but he never received the succinate. He states he spoke with his pharmacy and they never received the prescription. He says he has still been taking the metoprolol tartrate.

## 2020-12-25 NOTE — Telephone Encounter (Signed)
Called patient, he states that back in April when he seen Dr.Berry he suggested to stop the Metoprolol Tartrate and start the Metoprolol Succinate- he states that he did not realize that the pharmacy has been giving him the Tartrate this whole time and he has not been taking the Succinate- he states that he take Metoprolol Succinate 12.5 once daily (RX on his list states BID) I advised I would route to Dr.Berry to advise- patient recently seen PCP in August (vitals in chart) patient states he is feeling okay, but just wanted to mention this to [facility] and see what he suggest to do.   Will route to MD.

## 2020-12-25 NOTE — Patient Instructions (Addendum)
Pre visit review using our clinic review tool, if applicable. No additional management support is needed unless otherwise documented below in the visit note.  Continue  to take 1 tablet daily except 1 1/2 tablets on Saturdays only.  Re-check in 6 weeks.

## 2020-12-25 NOTE — Telephone Encounter (Signed)
I reviewed chart and appears metopropolol XL was discontinued 12/21/20 1013 Discontinue Ashworth, Staci T, CMA Reason:Discontinued by provider  12/21/20 1014 Taking Flag Checked Ashworth, Staci T, CMA    I am not sure why this was discontinued- will reach out to [facility] to check on this. Unless she sees a reason to discontinue- either she can renew the toprol XL 12.'5mg'$  once daily (half of 25 mg tablet) daily #45 with 3 refills or our team can

## 2020-12-25 NOTE — Telephone Encounter (Signed)
Spoke with [facility] who confirmed they only had Lopressor 12.5 mg bid on file - spoke with Mr Gilchrest who says he just realized that he has been taking Lopressor 12.5 mg daily and remembered that Dr Gwenlyn Found wanted to change to Toprol XL 12.5 mg daily back in April due to bradycardia pt wants to switch to Toprol XL 12.5 mg as Dr Gwenlyn Found recommended - pt has also contacted Dr Gwenlyn Found office (I reached out to Caprice Beaver, LPN) she says she will wait on response from Dr Gwenlyn Found and will update medication list and send in rx/refills

## 2020-12-25 NOTE — Telephone Encounter (Signed)
Noted.  Will update chart to verify that he is taking Metoprolol Tartrate 12.5 once daily ** (I originally stated it was Succinate, but wanted to just clarify that was a mistype) thank you!

## 2020-12-25 NOTE — Progress Notes (Signed)
I have reviewed and agree with note, evaluation, plan.   Brittney Mucha, MD  

## 2020-12-25 NOTE — Telephone Encounter (Signed)
Pt was in today for coumadin clinic and reported he noticed yesterday when picking up metoprolol that it was tartrate and succinate. He was told by Dr. Gwenlyn Found, cardiologist, in April of this year he was to stop taking tartrate and take succinate due to the tartrate doing something to his heart, he could not remember exactly what. Advised pt a msg would be sent to Dr. Yong Channel since he sent the script in to let him know and see if he will sen d the succinate in so he does not need to contact Dr. Gwenlyn Found. Pt was very appreciative and would like [facility] [address].  Part of Dr. Kennon Holter note below from 08/14/20 concerning the metoprolol;  Atrial fibrillation History of atrial fibrillation rate controlled on warfarin oral anticoagulation.  His heart rate is slightly slow 44.  I am going to decrease his Toprol-XL from 12.5 twice daily to 12.5 once a day  Medication Instructions:    -Only take metoprolol succinate (toprolol-xl) 12.'5mg'$  once daily.    -Stop taking metoprolol tartrate (lopressor).

## 2020-12-29 NOTE — Telephone Encounter (Signed)
This is being addressed by cardiology

## 2021-01-03 ENCOUNTER — Telehealth: Payer: Self-pay

## 2021-01-03 NOTE — Telephone Encounter (Signed)
LAST APPOINTMENT DATE:  12/03/20  NEXT APPOINTMENT DATE: 02/14/21  MEDICATION:tamsulosin (FLOMAX) 0.4 MG CAPS capsule  [id number] [facility] [address]

## 2021-01-03 NOTE — Telephone Encounter (Signed)
Pt can follow up at the pharmacy for refills. He had a 6 month refill in June.

## 2021-01-07 NOTE — Telephone Encounter (Signed)
Patient is calling in stating he hasnt heard anything, and the pharmacy is requesting pt call us.

## 2021-01-08 NOTE — Telephone Encounter (Signed)
Pt needs to follow up with the pharmacy. He got a 6 month refill in June.

## 2021-01-08 NOTE — Telephone Encounter (Signed)
[facility] to [facility] about [facility]ill. I was told they never got the refill. I gave verbal order for refill.

## 2021-01-08 NOTE — Telephone Encounter (Signed)
Called to speak with the pharmacy regarding this but the pharmacy is not open, will call back later.

## 2021-01-15 ENCOUNTER — Other Ambulatory Visit: Payer: Self-pay | Admitting: Family Medicine

## 2021-02-05 ENCOUNTER — Ambulatory Visit (INDEPENDENT_AMBULATORY_CARE_PROVIDER_SITE_OTHER): Payer: Medicare Other

## 2021-02-05 ENCOUNTER — Other Ambulatory Visit: Payer: Self-pay

## 2021-02-05 DIAGNOSIS — Z7901 Long term (current) use of anticoagulants: Secondary | ICD-10-CM | POA: Diagnosis not present

## 2021-02-05 LAB — POCT INR: INR: 2.2 (ref 2.0–3.0)

## 2021-02-05 NOTE — Progress Notes (Signed)
I have reviewed and agree with note, evaluation, plan.   Renay Crammer, MD  

## 2021-02-05 NOTE — Patient Instructions (Addendum)
Pre visit review using our clinic review tool, if applicable. No additional management support is needed unless otherwise documented below in the visit note.  Continue  to take 1 tablet daily except 1 1/2 tablets on Saturdays only.  Re-check in 7 weeks.

## 2021-02-07 NOTE — Progress Notes (Signed)
Phone: [phone]   Subjective:  Patient presents today for their annual physical. Chief complaint-noted.   See problem oriented charting- Review of Systems  Constitutional:  Negative for chills and fever.  HENT:  Positive for hearing loss and nosebleeds (yesterday morning- allergies were bothering him- using flonase likely contributed). Negative for ear pain.   Eyes:  Negative for blurred vision (above baseline- macular degeneration) and double vision.  Respiratory:  Negative for cough and shortness of breath.   Cardiovascular:  Negative for chest pain and palpitations.  Gastrointestinal:  Positive for diarrhea (occasional mild- fiber helps). Negative for blood in stool, constipation, heartburn, melena, nausea and vomiting.  Genitourinary:  Positive for frequency (on diuretics). Negative for dysuria.  Musculoskeletal:  Negative for back pain and joint pain.  Skin:  Positive for rash (encouraged him to schedule follow up with derm- says whitish discoloratoin to skin in am with red at times plus a spot on left chest that could be skin cancer (agreeds to call to schedule)). Negative for itching.  Neurological:  Negative for dizziness and headaches.  Endo/Heme/Allergies:  Negative for polydipsia. Bruises/bleeds easily.  Psychiatric/Behavioral:  Negative for depression and suicidal ideas.    The following were reviewed and entered/updated in epic: Past Medical History:  Diagnosis Date   BPH (benign prostatic hypertrophy)    Chronic atrial fibrillation [facility])    cardiologist--   dr berry   COPD (chronic obstructive pulmonary disease) ([place])    Diverticulosis of colon    MODERATE LEFT SIDE   Epididymal cyst    w/ epididymalitis   Full dentures    Gout    Hand foot syndrome    secondary to chemotherapy ([place])   cold hands/feet   Hearing loss    History of alcohol abuse    quit drinking in the mid 80's   History of cirrhosis of liver    alcoholic--  hx alcohol abuse -- quit  drinking 1980's   History of gout    History of melanoma excision    2013--  left ear lobe and back of hand   History of pancreatitis    2008   History of rectal cancer oncologist-  dr Benay Spice--  no recurrence   dx Sept 2009--  Stage II (T3N0)  s/p  sigmoid colectomy & low anterior resection 04-18-2008  and chemoradiation 2010   History of shingles 07/27/2010   History of squamous cell carcinoma excision    left lower leg   Hyperlipidemia    Hypertension    Loose stools    DUE TO ANTIBIOTICS   Macular degeneration    not sure which eye   OA (osteoarthritis)    RBBB (right bundle branch block)    Renal lesion    chronic-- left side   Sciatica of right side    Urge urinary incontinence    intermittant   Patient Active Problem List   Diagnosis Date Noted   Edema 11/10/2016    Priority: 1.   Atrial fibrillation ([place]) 12/07/2008    Priority: 1.   Chronic kidney disease (CKD), stage III (moderate) ([place]) 02/15/2019    Priority: 2.   Insomnia 10/19/2014    Priority: 2.   Gout 01/09/2014    Priority: 2.   History of cancer of rectosigmoid junction 01/04/2008    Priority: 2.   Former smoker 06/23/2007    Priority: 2.   Hyperlipidemia, unspecified 02/09/2007    Priority: 2.   Essential hypertension 12/07/2006    Priority: 2.  COPD (chronic obstructive pulmonary disease) ([place]) 12/07/2006    Priority: 2.   BPH (benign prostatic hyperplasia) 12/07/2006    Priority: 2.   Allergic rhinitis 08/15/2013    Priority: 3.   Arthritis of lumbar spine 01/20/2013    Priority: 3.   Inguinal hernia 03/21/2010    Priority: 3.   BASAL CELL CARCINOMA SKIN LOWER LIMB INCL HIP 03/21/2010    Priority: 3.   Actinic keratosis 12/07/2008    Priority: 3.   TMJ (temporomandibular joint disorder) 02/09/2007    Priority: 3.   URINARY INCONTINENCE 12/07/2006    Priority: 3.   PANCREATITIS, HX OF 12/07/2006    Priority: 3.   AAA (abdominal aortic aneurysm) without rupture 11/20/2019   PAD  (peripheral artery disease) ([place]) 11/20/2019   Aortic atherosclerosis ([place]) 01/05/2019   Thrombocytopenia ([place]) 11/10/2016   Encounter for therapeutic drug monitoring 08/27/2014   Past Surgical History:  Procedure Laterality Date   CARDIAC CATHETERIZATION  2001  approx.  in [place]  IMPLANT, BILATERAL     COLONOSCOPY  last one 06-27-2012   EXPLORATORY LAPAROTOMY/  LOW ANTERIOR RESECTION/  SIGMOID COLECTOMY  04-18-2008   DR INGRAM   INCISION AND DRAINAGE ABSCESS N/A 11/02/2014   Procedure: INCISION AND DRAINAGE OF SCROTUM;  Surgeon: Ardis Hughs, MD;  Location: [facility];  Service: Urology;  Laterality: N/A;   INGUINAL HERNIA REPAIR Right 03/23/2014   Procedure: HERNIA REPAIR INGUINAL ADULT OPEN REPAIR RIGHT INGUINAL HERNIA REPAIR;  Surgeon: Fanny Skates, MD;  Location: [place];  Service: General;  Laterality: Right;   INSERTION OF MESH Right 03/23/2014   Procedure: INSERTION OF MESH;  Surgeon: Fanny Skates, MD;  Location: [place];  Service: General;  Laterality: Right;   LAPAROSCOPIC CHOLECYSTECTOMY  1990's   MASS EXCISION N/A 11/02/2014   Procedure: EXCISION OF EPIDIDYMAL CYST;  Surgeon: Ardis Hughs, MD;  Location[facility];  Service: Urology;  Laterality: N/A;   TONSILLECTOMY  as child   TRANSTHORACIC ECHOCARDIOGRAM  05-11-2008   pseudonormal LV filling pattern,  ef 60-65%/  mild to moderate MV calcification no stenosis w/ mild to moderate regurg./  mild LAE and RAE/  mild TR    Family History  Problem Relation Age of Onset   Hypertension Mother    Lymphoma Father    Lymphoma Other    Stroke Other        1st degree relative   Colon cancer Neg Hx    Esophageal cancer Neg Hx    Rectal cancer Neg Hx    Stomach cancer Neg Hx     Medications- reviewed and updated Current Outpatient Medications  Medication Sig Dispense Refill   allopurinol (ZYLOPRIM) 300 MG tablet TAKE ONE-HALF TABLET BY MOUTH  IN THE EVENING 45 tablet 1   amLODipine (NORVASC) 2.5 MG tablet Take 2.5 mg by mouth daily. 1/2 tablet daily     colchicine 0.6 MG tablet Take 2 tablets (1.2 mg) at the first sign of flare, followed by 0.6 mg after 1 hour if pain not resolved. Then take once daily after that until pain resolves. Continue allopurinol during gout flares. 180 tablet 2   fluticasone (FLONASE) 50 MCG/ACT nasal spray Place 2 sprays into both nostrils daily. 48 g 3   furosemide (LASIX) 20 MG tablet TAKE 1 TABLET BY MOUTH EVERY DAY AS NEEDED 90 tablet 1   metoprolol tartrate (LOPRESSOR) 25 MG tablet Take 0.5 tablets (12.5 mg total)  by mouth daily. 90 tablet 1   Multiple Vitamin (MULTIVITAMIN) tablet Take 1 tablet by mouth daily.     oxyCODONE (ROXICODONE) 5 MG immediate release tablet Take 1 tablet (5 mg total) by mouth every 6 (six) hours as needed for breakthrough pain or severe pain. 8 tablet 0   tamsulosin (FLOMAX) 0.4 MG CAPS capsule TAKE 1 CAPSULE BY MOUTH EVERY DAY 90 capsule 1   zolpidem (AMBIEN) 5 MG tablet Take 0.5 tablets (2.5 mg total) by mouth at bedtime as needed for sleep. 15 tablet 5   triamterene-hydrochlorothiazide (MAXZIDE-25) 37.5-25 MG tablet TAKE 1/2 TABLET BY MOUTH DAILY 45 tablet 1   warfarin (COUMADIN) 2.5 MG tablet TAKE 1 TABLET DAILY EXCEPT TAKE 1 AND 1/2 TABLETS ON WED AND SAT OR TAKE AS DIRECTED BY CLINIC (Patient taking differently: Take 2.5 mg by mouth. TAKE 1 TABLET DAILY EXCEPT TAKE 1 AND 1/2 TABLETS ON SAT OR TAKE AS DIRECTED BY CLINIC) 120 tablet 1   No current facility-administered medications for this visit.    Allergies-reviewed and updated No Known Allergies  Social History   Social History Narrative   Married. 6 kids. Wife Joycelyn Schmid also a patient of Dr. Ronney Lion.       Retired.    Objective  Objective:  BP 124/79   Pulse (!) 53   Temp 98 F (36.7 C) (Oral)   Ht 5\' 11"  (1.803 m)   Wt 158 lb 12.8 oz (72 kg)   SpO2 99%   BMI 22.15 kg/m  Gen: NAD, resting  comfortably HEENT: Mucous membranes are moist. Oropharynx normal Neck: no thyromegaly CV: slightly bradycardic but regular rate no murmurs rubs or gallops Lungs: CTAB no crackles, wheeze, rhonchi Abdomen: soft/nontender/nondistended/normal bowel sounds. No rebound or guarding.  Ext: 1+ edema on right, 2+ on left lower leg. Venous stasis skin changes.. no ankle pain on exam Skin: warm, dry Neuro: grossly normal, moves all extremities, PERRLA    Assessment and Plan  87 y.o. male presenting for annual physical.  Health Maintenance counseling: 1. Anticipatory guidance: Patient counseled regarding regular dental exams- wears dentures , eye exams- yearly, more frequently due to injections macular degeneration,  avoiding smoking and second hand smoke , limiting alcohol to 2 beverages per day - doesn't drink.  No illicit drugs -  2. Risk factor reduction:  Advised patient of need for regular exercise and diet rich and fruits and vegetables to reduce risk of heart attack and stroke. Exercise- extremely limited, encouraged to start even minimal walking- doesn't like leaving his wife though .  Diet-weight down 8 pounds over the last year-a lot of stress in caregiving-encourage no further weight loss-we will check a TSH  Wt Readings from Last 3 Encounters:  02/14/21 158 lb 12.8 oz (72 kg)  12/04/20 154 lb 6.4 oz (70 kg)  12/03/20 153 lb 12.8 oz (69.8 kg)  3. Immunizations/screenings/ancillary studies DISCUSSED:  -Shingrix vac #1 - discussed at pharmacy -covid booster #3 repeat planned - bivalent vaccination recommended-he declines -Flu vac (last one 09/21) - flu shot today high-dose Immunization History  Administered Date(s) Administered   Fluad Quad(high Dose 65+) 01/05/2019, 12/29/2019   Influenza Split 01/14/2011   Influenza, High Dose Seasonal PF 01/20/2013, 05/21/2017, 03/03/2018   Influenza,inj,Quad PF,6+ Mos 01/09/2014   PFIZER(Purple Top)SARS-COV-2 Vaccination 05/04/2019, 05/22/2019    Pneumococcal Conjugate-13 07/10/2014   Pneumococcal Polysaccharide-23 04/20/2006   Td 11/10/2012   Tdap 01/09/2018  4. Prostate cancer screening- passed age based screening recommendations. Continues to have BPH issues- frequency  and incontinence. Will get UAif he can urinate  Lab Results  Component Value Date   PSA 1.04 01/06/2011   PSA 1.52 02/09/2007   5. Colon cancer screening - colonoscopy 06/27/12 - passed age based screening recommendations- oncology released him and no further colonoscopy after surgery, radiation, chemo for cancer of rectosigmoid junction.  No blood in the stool or dark black stool 6. Skin cancer screening-sees dermatology - needs follow up. advised regular sunscreen use. Rash plus new spot on chest 7. Smoking associated screening (lung cancer screening, AAA screen 65-75, UA)- Former smoker- quit smoking in 2009 after cancer diagnosis.  No regular screening required other than we will get UA  8. STD screening - declines monogomous  Status of chronic or acute concerns   # Hospital F/U for Left Ankle Swelling/pain S: 87 year old patient presented to the Ed 11/28/2020 with acute left ankle pain. From last a/p "87 year old male  with history of gout with acute pain that woke him up at night in left ankle- no significant warmth or redness but was very painful and tender to touch. Not improving substantially with colchicine as gout has in the past for him.  - I am not sure what is causing this- I would like to get sports medicines opinion - will place urgent referral  - I do not suspect DVT- no calf pain and no history of clot- does have edema in lower leg but pronounced just around ankle - plus on coumadin so low risk of clot. Also no shortness of breath to suggest PE above baseline with COPD -amlodipine could contribute to edema but should not cause this level of pain  -history of edema that lasix helps but that has bene for bilateral edema.  - considered prednisone- but  wants to start with sports medicine to see what underlying diagnosis is- we discussed this may help if gout or pseudogout ".  Saw Dr. Georgina Snell on 12/04/2020-they remain concerned for potential gout and they offered aspiration and injection-due to patient improvement by time of visit he opted to continue to monitor.  Plan was to continue colchicine for another week and return to clinic if fails to improve  Swelling improves each night when wakes up and then worsens as day goes on.  A/P: left ankle continues to swell. Still on coumadin- doubt DVT. No calf pain. No size differential 10 cm below tibial plateau. He is not sure lasix helps that much - encourage elevation as much as possible- if worsening symptoms let us know. No pain in left ankle right now- do not think he needs to see sports medicine at the minute  # Atrial fibrillation S: Rate controlled with metoprolol 25 mg daily- IR per Dr.Berry since patient has done well with this- takes half a tablet daily per report  Anticoagulated with coumadin through [facility]  Patient was followed by cardiology: Dr. Gwenlyn Found  A/P: Appropriately anticoagulated and rate controlled-continue current medication-    #hypertension S: medication: Maxzide 37.5-25 mg daily,  amlodipine 2.5 mg daily, metoprolol 12.5 mg daily, Lasix 20 mg as needed- not sure how much this is helping  BP Readings from Last 3 Encounters:  02/14/21 124/79  12/04/20 110/68  12/03/20 134/83  A/P: Blood pressure well controlled-continue current medication. He thinks taking full maxzide -25 and BP contorlled- he may try half but would need to monitor blood pressure  #Chronic kidney disease stage III S: GFR is typically in the 50s range -Patient knows to avoid NSAIDs  A/P: hopefully stable- update cmp today.    #Gout S: Medication: On allopurinol 300 mg; colchicine 0.6Mg  on hand if flared Lab Results  Component Value Date   LABURIC 5.8 12/04/2020  A/P: see discussion above about  possible gout- uric acid has been controlled- do not need to repeat uric acid as controlled last visit. I do not think left ankle issues are related to gout as of today (swelling but no pain)  # insomnia S:ambien 2.5mg  as needed. Up to 4-5 days a week.  -failed trazodone in 2019 opted to hold off around the the time with acute pain. Advised to avoid Tylenol PM.  A/P: working ok- continue current meds   # COPD- stable without meds- will monitor  #infrarenal aortic aneurysm- stable 11/01/19 and monitor yearly "Abdominal Aorta: There is evidence of abnormal dilatation of the distal  Abdominal aorta. There is evidence of abnormal dilation of the Left Common  Iliac artery. The largest aortic measurement is 4.2 cm. The largest aortic  diameter remains essentially  unchanged compared to prior exam. Previous diameter measurement was 4.3 cm  obtained on CT 10/2018. "    Recommended follow up: Return in about 3 months (around 05/17/2021) for follow-up or sooner if needed. Future Appointments  Date Time Provider [place]  03/24/2021  3:15 PM Gardiner Barefoot, DPM [facility]  03/26/2021 11:00 AM [facility]  12/26/2021 11:00 AM [facility] HEALTH COACH [facility]   Lab/Order associations: NOT fasting   ICD-10-CM   1. Preventative health care  Z00.00     2. Essential hypertension  I10 POCT Urinalysis Dipstick (Automated)    3. Mixed hyperlipidemia  E78.2 TSH    Comprehensive metabolic panel    CBC with Differential/Platelet    Lipid panel    4. Atrial fibrillation, unspecified type ([place])  I48.91     5. Chronic gout without tophus, unspecified cause, unspecified site  [id number]     6. Stage 3a chronic kidney disease (HCC)  N18.31     7. Chronic obstructive pulmonary disease, unspecified COPD type ([place])  J44.9     8. Abdominal aortic aneurysm (AAA) without rupture, unspecified part  I71.40 VAS [place] AAA DUPLEX    9. Primary insomnia  F51.01     10.  History of cancer of rectosigmoid junction  Z85.048 CEA     No orders of the defined types were placed in this encounter.  I,Jada Bradford,acting as a scribe for [name], MD.,have documented all relevant documentation on the behalf of [name], MD,as directed by  [name], MD while in the presence of [name], MD.  I, [name], MD, have reviewed all documentation for this visit. The documentation on 02/14/21 for the exam, diagnosis, procedures, and orders are all accurate and complete.  Return precautions advised.  [name], MD

## 2021-02-09 NOTE — Telephone Encounter (Signed)
This concern has been previously addressed by myself and/or another provider.  If they patient has ongoing concerns, they can contact me at their convenience.  Thank you,  Rich Marlaysia Lenig, NP 

## 2021-02-14 ENCOUNTER — Ambulatory Visit (INDEPENDENT_AMBULATORY_CARE_PROVIDER_SITE_OTHER): Payer: Medicare Other | Admitting: Family Medicine

## 2021-02-14 ENCOUNTER — Encounter: Payer: Self-pay | Admitting: Family Medicine

## 2021-02-14 ENCOUNTER — Other Ambulatory Visit: Payer: Self-pay

## 2021-02-14 VITALS — BP 124/79 | HR 53 | Temp 98.0°F | Ht 71.0 in | Wt 158.8 lb

## 2021-02-14 DIAGNOSIS — J449 Chronic obstructive pulmonary disease, unspecified: Secondary | ICD-10-CM

## 2021-02-14 DIAGNOSIS — Z0000 Encounter for general adult medical examination without abnormal findings: Secondary | ICD-10-CM | POA: Diagnosis not present

## 2021-02-14 DIAGNOSIS — E782 Mixed hyperlipidemia: Secondary | ICD-10-CM

## 2021-02-14 DIAGNOSIS — I10 Essential (primary) hypertension: Secondary | ICD-10-CM | POA: Diagnosis not present

## 2021-02-14 DIAGNOSIS — I714 Abdominal aortic aneurysm, without rupture, unspecified: Secondary | ICD-10-CM

## 2021-02-14 DIAGNOSIS — Z23 Encounter for immunization: Secondary | ICD-10-CM | POA: Diagnosis not present

## 2021-02-14 DIAGNOSIS — M1A9XX0 Chronic gout, unspecified, without tophus (tophi): Secondary | ICD-10-CM

## 2021-02-14 DIAGNOSIS — I4891 Unspecified atrial fibrillation: Secondary | ICD-10-CM | POA: Diagnosis not present

## 2021-02-14 DIAGNOSIS — N1831 Chronic kidney disease, stage 3a: Secondary | ICD-10-CM

## 2021-02-14 DIAGNOSIS — F5101 Primary insomnia: Secondary | ICD-10-CM

## 2021-02-14 DIAGNOSIS — Z85048 Personal history of other malignant neoplasm of rectum, rectosigmoid junction, and anus: Secondary | ICD-10-CM

## 2021-02-14 LAB — POC URINALSYSI DIPSTICK (AUTOMATED)
Bilirubin, UA: NEGATIVE
Blood, UA: NEGATIVE
Glucose, UA: NEGATIVE
Ketones, UA: NEGATIVE
Leukocytes, UA: NEGATIVE
Nitrite, UA: NEGATIVE
Protein, UA: NEGATIVE
Spec Grav, UA: 1.015 (ref 1.010–1.025)
Urobilinogen, UA: 1 E.U./dL
pH, UA: 6 (ref 5.0–8.0)

## 2021-02-14 NOTE — Patient Instructions (Addendum)
Health Maintenance Due  Topic Date Due   Zoster Vaccines- Shingrix (1 of 2)- Please check with your pharmacy to see if they have the shingrix vaccine. If they do- please get this immunization and update [place] by phone call or mychart with dates you receive the vaccine Never done   High dose flu shot done today. Get wife scheduled too  Use Eucerin lotion or CeraVe lotion for dry skin.   Please schedule a Dermatology follow-up.  Please stop by lab before you go If you have mychart- we will send your results within 3 business days of [place] receiving them.  If you do not have mychart- we will call you about results within 5 business days of [place] receiving them.  *please also note that you will see labs on mychart as soon as they post. I will later go in and write notes on them- will say "notes from Dr. Yong Channel"  Recommended follow up: Return in about 3 months (around 05/17/2021) for follow-up or sooner if needed. Can do 6 months if you prefer if doing well

## 2021-02-14 NOTE — Addendum Note (Signed)
Addended by: Loura Back on: 02/14/2021 04:34 PM   Modules accepted: Orders

## 2021-02-15 LAB — CBC WITH DIFFERENTIAL/PLATELET
Absolute Monocytes: 563 cells/uL (ref 200–950)
Basophils Absolute: 30 cells/uL (ref 0–200)
Basophils Relative: 0.7 %
Eosinophils Absolute: 99 cells/uL (ref 15–500)
Eosinophils Relative: 2.3 %
HCT: 44.7 % (ref 38.5–50.0)
Hemoglobin: 14.5 g/dL (ref 13.2–17.1)
Lymphs Abs: 1084 cells/uL (ref 850–3900)
MCH: 30.1 pg (ref 27.0–33.0)
MCHC: 32.4 g/dL (ref 32.0–36.0)
MCV: 92.9 fL (ref 80.0–100.0)
MPV: 11.1 fL (ref 7.5–12.5)
Monocytes Relative: 13.1 %
Neutro Abs: 2524 cells/uL (ref 1500–7800)
Neutrophils Relative %: 58.7 %
Platelets: 124 10*3/uL — ABNORMAL LOW (ref 140–400)
RBC: 4.81 10*6/uL (ref 4.20–5.80)
RDW: 13.1 % (ref 11.0–15.0)
Total Lymphocyte: 25.2 %
WBC: 4.3 10*3/uL (ref 3.8–10.8)

## 2021-02-15 LAB — COMPREHENSIVE METABOLIC PANEL
AG Ratio: 1.8 (calc) (ref 1.0–2.5)
ALT: 10 U/L (ref 9–46)
AST: 19 U/L (ref 10–35)
Albumin: 4.2 g/dL (ref 3.6–5.1)
Alkaline phosphatase (APISO): 69 U/L (ref 35–144)
BUN: 25 mg/dL (ref 7–25)
CO2: 28 mmol/L (ref 20–32)
Calcium: 9.5 mg/dL (ref 8.6–10.3)
Chloride: 102 mmol/L (ref 98–110)
Creat: 1.15 mg/dL (ref 0.70–1.22)
Globulin: 2.3 g/dL (calc) (ref 1.9–3.7)
Glucose, Bld: 94 mg/dL (ref 65–99)
Potassium: 3.6 mmol/L (ref 3.5–5.3)
Sodium: 141 mmol/L (ref 135–146)
Total Bilirubin: 0.7 mg/dL (ref 0.2–1.2)
Total Protein: 6.5 g/dL (ref 6.1–8.1)

## 2021-02-15 LAB — LIPID PANEL
Cholesterol: 182 mg/dL (ref <200)
HDL: 42 mg/dL (ref >=40)
LDL Cholesterol (Calc): 115 mg/dL (calc) — ABNORMAL HIGH
Non-HDL Cholesterol (Calc): 140 mg/dL (calc) — ABNORMAL HIGH (ref <130)
Total CHOL/HDL Ratio: 4.3 (calc) (ref <5.0)
Triglycerides: 133 mg/dL (ref <150)

## 2021-02-15 LAB — TSH: TSH: 4.65 mIU/L — ABNORMAL HIGH (ref 0.40–4.50)

## 2021-02-15 LAB — CEA: CEA: 2.1 ng/mL

## 2021-02-18 ENCOUNTER — Telehealth: Payer: Self-pay

## 2021-02-18 NOTE — Telephone Encounter (Signed)
Appreciate the updates-noted

## 2021-02-18 NOTE — Telephone Encounter (Signed)
FYI, see message. 

## 2021-02-18 NOTE — Telephone Encounter (Signed)
Pt called regarding some appts that he wants Dr Yong Channel to be aware of. He stated that he was seen for an X-ray on 8/11 and is now scheduled for an AAA Duplex on 11/28.

## 2021-02-20 ENCOUNTER — Other Ambulatory Visit: Payer: Self-pay | Admitting: Family Medicine

## 2021-03-11 ENCOUNTER — Telehealth: Payer: Self-pay

## 2021-03-11 NOTE — Telephone Encounter (Signed)
Pt called stating that he is having a AAA Duplex on 11/28. He stated that it has not been 6 month since the last one. Pt is confused and wants to know if he needs to do it. Please Advise.

## 2021-03-11 NOTE — Telephone Encounter (Signed)
Please advise 

## 2021-03-11 NOTE — Telephone Encounter (Signed)
Look under CV imaging- appears last one was 2021- am I overlooking something?   Testicular ultrasound more recently

## 2021-03-17 ENCOUNTER — Ambulatory Visit (HOSPITAL_COMMUNITY)
Admission: RE | Admit: 2021-03-17 | Discharge: 2021-03-17 | Disposition: A | Discharge status: Home / Self Care | Payer: Medicare Other | Source: Ambulatory Visit | Attending: Internal Medicine | Admitting: Internal Medicine

## 2021-03-17 ENCOUNTER — Other Ambulatory Visit: Payer: Self-pay | Admitting: Family Medicine

## 2021-03-17 ENCOUNTER — Other Ambulatory Visit: Payer: Self-pay

## 2021-03-17 DIAGNOSIS — I714 Abdominal aortic aneurysm, without rupture, unspecified: Secondary | ICD-10-CM | POA: Insufficient documentation

## 2021-03-17 DIAGNOSIS — I7143 Infrarenal abdominal aortic aneurysm, without rupture: Secondary | ICD-10-CM | POA: Diagnosis not present

## 2021-03-24 ENCOUNTER — Ambulatory Visit: Payer: Medicare Other | Admitting: Podiatry

## 2021-03-26 ENCOUNTER — Ambulatory Visit: Payer: Medicare Other

## 2021-04-02 ENCOUNTER — Other Ambulatory Visit: Payer: Self-pay

## 2021-04-02 ENCOUNTER — Ambulatory Visit (INDEPENDENT_AMBULATORY_CARE_PROVIDER_SITE_OTHER): Payer: Medicare Other

## 2021-04-02 DIAGNOSIS — Z7901 Long term (current) use of anticoagulants: Secondary | ICD-10-CM

## 2021-04-02 LAB — POCT INR: INR: 1.9 — AB (ref 2.0–3.0)

## 2021-04-02 NOTE — Progress Notes (Signed)
I have reviewed and agree with note, evaluation, plan.   Eulala Newcombe, MD  

## 2021-04-02 NOTE — Patient Instructions (Addendum)
Pre visit review using our clinic review tool, if applicable. No additional management support is needed unless otherwise documented below in the visit note.  Take 1 1/2 tablets today and then continue  to take 1 tablet daily except 1 1/2 tablets on Saturdays only.  Re-check in 6 wks.

## 2021-04-02 NOTE — Progress Notes (Signed)
Take 1 1/2 tablets today and then continue  to take 1 tablet daily except 1 1/2 tablets on Saturdays only.  Re-check in 6 wks.

## 2021-04-17 ENCOUNTER — Encounter: Payer: Self-pay | Admitting: Family Medicine

## 2021-04-17 ENCOUNTER — Ambulatory Visit: Payer: Medicare Other | Admitting: Family Medicine

## 2021-04-17 VITALS — BP 118/68 | HR 88 | Temp 98.3°F | Resp 15 | Ht 71.0 in | Wt 158.4 lb

## 2021-04-17 DIAGNOSIS — R051 Acute cough: Secondary | ICD-10-CM

## 2021-04-17 DIAGNOSIS — R509 Fever, unspecified: Secondary | ICD-10-CM | POA: Diagnosis not present

## 2021-04-17 LAB — POC INFLUENZA A&B (BINAX/QUICKVUE)
Influenza A, POC: NEGATIVE
Influenza B, POC: NEGATIVE

## 2021-04-17 NOTE — Progress Notes (Signed)
Subjective:  Patient ID: William Mahoney, male    DOB: 04-Jan-1926  Age: 87 y.o. MRN: [id number]  CC:  Chief Complaint  Patient presents with   Fever    Pt reports started yesterday had fever, chills, fatigue, reports 100.4 fever yesterday, went to bed feeling better, home COVID test negative this morning, sinus drainage and notes eyes are watery     HPI William Mahoney presents for   Acute visit, PCP Dr. Yong Channel.   Fever, fatigue, chills. Started yesterday morning at 11am, felt out of sorts, chills, Temp 100.8 last night. Chills, shivering last night. Here with dtr - lives with her. She had flu 3.5wks ago, and works at daycare. No recent contact with flu/covid known.  Some sinus drainage, watery eyes. No dysuria, or new urinary symptoms, abd pain,n/v.  Min cough. No dyspnea/chest pain.  Some fluids. No confusion. Eating ok.  Home COVID test negative this morning.  Tx: tylenol - last night once.   Past medical history noted including CKD, PAD, thrombocytopenia, A. fib, COPD.  He is on anticoagulation with Coumadin, followed by anticoagulation at [facility] primary care or [place], INR 04/02/2021 1.9.  Next scheduled appointment January 25.  Takes 3.75 mg every Saturday, 2.5 mg all other days.  Total weekly dose 18.75 mg. Creat 1.15 in 01/2021 COVID risk of complication score 6. COVID vaccination January 2021, February 2021, booster:none.  Flu vaccine October 28th, high-dose.  Immunization History  Administered Date(s) Administered   Fluad Quad(high Dose 65+) 01/05/2019, 12/29/2019, 02/14/2021   Influenza Split 01/14/2011   Influenza, High Dose Seasonal PF 01/20/2013, 05/21/2017, 03/03/2018   Influenza,inj,Quad PF,6+ Mos 01/09/2014   PFIZER(Purple Top)SARS-COV-2 Vaccination 05/04/2019, 05/22/2019   Pneumococcal Conjugate-13 07/10/2014   Pneumococcal Polysaccharide-23 04/20/2006   Td 11/10/2012   Tdap 01/09/2018       History Patient Active Problem List   Diagnosis Date  Noted   AAA (abdominal aortic aneurysm) without rupture 11/20/2019   PAD (peripheral artery disease) ([place]) 11/20/2019   Chronic kidney disease (CKD), stage III (moderate) ([place]) 02/15/2019   Aortic atherosclerosis ([place]) 01/05/2019   Thrombocytopenia ([place]) 11/10/2016   Edema 11/10/2016   Insomnia 10/19/2014   Encounter for therapeutic drug monitoring 08/27/2014   Gout 01/09/2014   Allergic rhinitis 08/15/2013   Arthritis of lumbar spine 01/20/2013   Inguinal hernia 03/21/2010   BASAL CELL CARCINOMA SKIN LOWER LIMB INCL HIP 03/21/2010   Atrial fibrillation ([place]) 12/07/2008   Actinic keratosis 12/07/2008   History of cancer of rectosigmoid junction 01/04/2008   Former smoker 06/23/2007   Hyperlipidemia, unspecified 02/09/2007   TMJ (temporomandibular joint disorder) 02/09/2007   Essential hypertension 12/07/2006   COPD (chronic obstructive pulmonary disease) ([place]) 12/07/2006   BPH (benign prostatic hyperplasia) 12/07/2006   URINARY INCONTINENCE 12/07/2006   PANCREATITIS, HX OF 12/07/2006   Past Medical History:  Diagnosis Date   BPH (benign prostatic hypertrophy)    Chronic atrial fibrillation [facility])    cardiologist--   dr berry   COPD (chronic obstructive pulmonary disease) ([place])    Diverticulosis of colon    MODERATE LEFT SIDE   Epididymal cyst    w/ epididymalitis   Full dentures    Gout    Hand foot syndrome    secondary to chemotherapy ([place])   cold hands/feet   Hearing loss    History of alcohol abuse    quit drinking in the mid 80's   History of cirrhosis of liver    alcoholic--  hx alcohol abuse -- quit  drinking 1980's   History of gout    History of melanoma excision    2013--  left ear lobe and back of hand   History of pancreatitis    2008   History of rectal cancer oncologist-  dr Benay Spice--  no recurrence   dx Sept 2009--  Stage II (T3N0)  s/p  sigmoid colectomy & low anterior resection 04-18-2008  and chemoradiation 2010   History of shingles 07/27/2010    History of squamous cell carcinoma excision    left lower leg   Hyperlipidemia    Hypertension    Loose stools    DUE TO ANTIBIOTICS   Macular degeneration    not sure which eye   OA (osteoarthritis)    RBBB (right bundle branch block)    Renal lesion    chronic-- left side   Sciatica of right side    Urge urinary incontinence    intermittant   Past Surgical History:  Procedure Laterality Date   CARDIAC CATHETERIZATION  2001  approx.  in [place]  IMPLANT, BILATERAL     COLONOSCOPY  last one 06-27-2012   EXPLORATORY LAPAROTOMY/  LOW ANTERIOR RESECTION/  SIGMOID COLECTOMY  04-18-2008   DR INGRAM   INCISION AND DRAINAGE ABSCESS N/A 11/02/2014   Procedure: INCISION AND DRAINAGE OF SCROTUM;  Surgeon: Ardis Hughs, MD;  Location[facility];  Service: Urology;  Laterality: N/A;   INGUINAL HERNIA REPAIR Right 03/23/2014   Procedure: HERNIA REPAIR INGUINAL ADULT OPEN REPAIR RIGHT INGUINAL HERNIA REPAIR;  Surgeon: Fanny Skates, MD;  Location: [place];  Service: General;  Laterality: Right;   INSERTION OF MESH Right 03/23/2014   Procedure: INSERTION OF MESH;  Surgeon: Fanny Skates, MD;  Location: [place];  Service: General;  Laterality: Right;   LAPAROSCOPIC CHOLECYSTECTOMY  1990's   MASS EXCISION N/A 11/02/2014   Procedure: EXCISION OF EPIDIDYMAL CYST;  Surgeon: Ardis Hughs, MD;  Location: [facility];  Service: Urology;  Laterality: N/A;   TONSILLECTOMY  as child   TRANSTHORACIC ECHOCARDIOGRAM  05-11-2008   pseudonormal LV filling pattern,  ef 60-65%/  mild to moderate MV calcification no stenosis w/ mild to moderate regurg./  mild LAE and RAE/  mild TR   No Known Allergies Prior to Admission medications   Medication Sig Start Date End Date Taking? Authorizing Provider  allopurinol (ZYLOPRIM) 300 MG tablet TAKE ONE-HALF TABLET BY MOUTH IN THE EVENING 12/24/20  Yes Marin Olp, MD  amLODipine  (NORVASC) 2.5 MG tablet TAKE 1 TABLET BY MOUTH EVERY DAY 03/17/21  Yes Marin Olp, MD  colchicine 0.6 MG tablet Take 2 tablets (1.2 mg) at the first sign of flare, followed by 0.6 mg after 1 hour if pain not resolved. Then take once daily after that until pain resolves. Continue allopurinol during gout flares. 12/03/20  Yes Marin Olp, MD  fluticasone [facility]) 50 MCG/ACT nasal spray Place 2 sprays into both nostrils daily. 03/30/19  Yes Marin Olp, MD  furosemide (LASIX) 20 MG tablet TAKE 1 TABLET BY MOUTH EVERY DAY AS NEEDED 08/15/20  Yes Marin Olp, MD  metoprolol tartrate (LOPRESSOR) 25 MG tablet Take 0.5 tablets (12.5 mg total) by mouth daily. 12/25/20  Yes Lorretta Harp, MD  Multiple Vitamin (MULTIVITAMIN) tablet Take 1 tablet by mouth daily.   Yes [provider]  oxyCODONE (ROXICODONE) 5 MG immediate release tablet Take 1 tablet (5 mg total) by  mouth every 6 (six) hours as needed for breakthrough pain or severe pain. 11/28/20  Yes Sherwood Gambler, MD  tamsulosin (FLOMAX) 0.4 MG CAPS capsule TAKE 1 CAPSULE BY MOUTH EVERY DAY 01/16/21  Yes Marin Olp, MD  triamterene-hydrochlorothiazide (MAXZIDE-25) 37.5-25 MG tablet TAKE 1/2 TABLET BY MOUTH EVERY DAY 02/20/21  Yes Marin Olp, MD  warfarin (COUMADIN) 2.5 MG tablet TAKE 1 TABLET DAILY EXCEPT TAKE 1 AND 1/2 TABLETS ON WED AND SAT OR TAKE AS DIRECTED BY CLINIC Patient taking differently: Take 2.5 mg by mouth. TAKE 1 TABLET DAILY EXCEPT TAKE 1 AND 1/2 TABLETS ON SAT OR TAKE AS DIRECTED BY CLINIC 12/13/20  Yes Marin Olp, MD  zolpidem (AMBIEN) 5 MG tablet Take 0.5 tablets (2.5 mg total) by mouth at bedtime as needed for sleep. 01/16/21  Yes Marin Olp, MD   Social History   Socioeconomic History   Marital status: Married    Spouse name: Not on file   Number of children: 6   Years of education: Not on file   Highest education level: High school graduate  Occupational History    Occupation: Retired    Fish farm manager: RETIRED  Tobacco Use   Smoking status: Former    Packs/day: 1.00    Years: 55.00    Pack years: 55.00    Types: Cigarettes    Quit date: 03/20/2008    Years since quitting: 13.0   Smokeless tobacco: Never  Vaping Use   Vaping Use: Never used  Substance and Sexual Activity   Alcohol use: No    Comment: hx alcohol abuse -- quit in 1980's    Drug use: No   Sexual activity: Not on file  Other Topics Concern   Not on file  Social History Narrative   Married. 6 kids. Wife Joycelyn Schmid also a patient of Dr. Ronney Lion.       Retired.    Social Determinants of Health   Financial Resource Strain: Not on file  Food Insecurity: Not on file  Transportation Needs: Not on file  Physical Activity: Not on file  Stress: Not on file  Social Connections: Not on file  Intimate Partner Violence: Not on file    Review of Systems Per HPI  Objective:   Vitals:   04/17/21 1347  BP: 118/68  Pulse: 88  Resp: 15  Temp: 98.3 F (36.8 C)  TempSrc: Temporal  SpO2: 94%  Weight: 158 lb 6.4 oz (71.8 kg)  Height: 5\' 11"  (1.803 m)     Physical Exam Vitals reviewed.  Constitutional:      General: He is not in acute distress.    Appearance: Normal appearance. He is well-developed. He is not ill-appearing, toxic-appearing or diaphoretic.  HENT:     Head: Normocephalic and atraumatic.     Right Ear: External ear normal.     Left Ear: External ear normal.     Nose: No rhinorrhea.     Mouth/Throat:     Pharynx: No oropharyngeal exudate or posterior oropharyngeal erythema.  Eyes:     Conjunctiva/sclera: Conjunctivae normal.     Pupils: Pupils are equal, round, and reactive to light.  Cardiovascular:     Rate and Rhythm: Normal rate and regular rhythm.     Heart sounds: Normal heart sounds. No murmur heard. Pulmonary:     Effort: Pulmonary effort is normal.     Breath sounds: Normal breath sounds. No wheezing, rhonchi or rales.  Abdominal:     General: There  is  no distension.     Palpations: Abdomen is soft.     Tenderness: There is no abdominal tenderness.  Musculoskeletal:     Cervical back: Neck supple.  Lymphadenopathy:     Cervical: No cervical adenopathy.  Skin:    General: Skin is warm and dry.     Findings: No rash.  Neurological:     Mental Status: He is alert and oriented to person, place, and time.  Psychiatric:        Behavior: Behavior normal.      Assessment & Plan:  William Mahoney is a 87 y.o. male . Fever, unspecified fever cause - Plan: POC Influenza A&B(BINAX/QUICKVUE), Novel Coronavirus, NAA ([facility])  Acute cough - Plan: POC Influenza A&B(BINAX/QUICKVUE), Novel Coronavirus, NAA ([facility]) Fever, chills, URI symptoms.  Possible viral illness such as flu or COVID but flu testing negative and initial COVID testing negative.  Possible false negative given timing of symptoms.  COVID PCR test sent.  Nontoxic appearance.  Symptomatic care discussed for now with ER/urgent care precautions.  Would strongly consider antiviral if positive COVID testing given initial COVID-vaccine but no booster and other risk for complications.  ER/urgent care precautions given.  Isolation/masking precautions discussed.  No orders of the defined types were placed in this encounter.  Patient Instructions  Flu test was normal/negative in the office today.  Glad to hear that the COVID test was negative at home today but that may have been falsely negative as test was done fairly soon after your symptoms have started.  I did send out a PCR COVID test today and should have results likely by tomorrow.  In the meantime I do recommend drinking plenty of fluids, rest, Tylenol as needed for fever, body aches or chills.  Mucinex if needed for cough.  If you have any chest pains, shortness of breath (especially at rest) or confusion be seen right away through emergency room or urgent care.  I will be contacting you as soon as I have results of the COVID test  and if that is positive would consider prescribing an antiviral medication.  We do need to start that within the first few days of infection so we will be talking to you soon.  In the meantime I do recommend that you try to isolate from others, and wear a mask anytime you do need to be around others.  Please let us know if there are questions.  Fever, Adult   A fever is an increase in the body's temperature. It is usually defined as a temperature of 100.15F (38C) or higher. Brief mild or moderate fevers generally have no long-term effects, and they often do not need treatment. Moderate or high fevers may make you feel uncomfortable and can sometimes be a sign of a serious illness or disease. The sweating that may occur with repeated or prolonged fever may also cause a loss of fluid in the body (dehydration). Fever is confirmed by taking a temperature with a thermometer. A measured temperature can vary with: Age. Time of day. Where in the body you take the temperature. Readings may vary if you place the thermometer: In the mouth (oral). In the rectum (rectal). In the ear (tympanic). Under the arm (axillary). On the forehead (temporal). Follow these instructions at home: Medicines Take over-the counter and prescription medicines only as told by your health care provider. Follow the dosing instructions carefully. If you were prescribed an antibiotic medicine, take it as told by your health care provider. Do not  stop taking the antibiotic even if you start to feel better. General instructions Watch your condition for any changes. Let your health care provider know about them. Rest as needed. Drink enough fluid to keep your urine pale yellow. This helps to prevent dehydration. Sponge yourself or bathe with room-temperature water to help reduce your body temperature as needed. Do not use ice water. Do not use too many blankets or wear clothes that are too heavy. If your fever may be caused by an  infection that spreads from person to person (is contagious), such as a cold or the flu, you should stay home from work and public gatherings for at least 24 hours after your fever is gone. Your fever should be gone without the need to use medicines. Contact a health care provider if: You vomit. You cannot eat or drink without vomiting. You have diarrhea. You have pain when you urinate. Your symptoms do not improve with treatment. You develop new symptoms. You develop excessive weakness. Get help right away if: You have shortness of breath or have trouble breathing. You are dizzy or you faint. You are disoriented or confused. You develop signs of dehydration, such as: Dark urine, very little urine, or no urine. Cracked lips. Dry mouth. Sunken eyes. Sleepiness. Weakness. You develop severe pain in your abdomen. You have persistent vomiting or diarrhea. You develop a skin rash. Your symptoms suddenly get worse. Summary A fever is an increase in the body's temperature. It is usually defined as a temperature of 100.9F (38C) or higher. Moderate or high fevers can sometimes be a sign of a serious illness or disease. The sweating that may occur with repeated or prolonged fever may also cause dehydration. Pay attention to any changes in your symptoms and contact your health care provider if your symptoms do not improve with treatment. Take over-the counter and prescription medicines only as told by your health care provider. Follow the dosing instructions carefully. If your fever is from an infection that may be contagious, such as cold or flu, you should stay home from work and public gatherings for at least 24 hours after your fever is gone. Your fever should be gone without the need to use medicines. Get help right away if you develop signs of dehydration, such as dark urine, cracked lips, dry mouth, sunken eyes, sleepiness, or weakness. This information is not intended to replace advice  given to you by your health care provider. Make sure you discuss any questions you have with your health care provider. Document Revised: 08/27/2020 Document Reviewed: 08/27/2020 Elsevier Patient Education  2022 [place],   Merri Ray, MD [place], [facility] 04/17/21 3:12 PM

## 2021-04-17 NOTE — Patient Instructions (Addendum)
Flu test was normal/negative in the office today.  Glad to hear that the COVID test was negative at home today but that may have been falsely negative as test was done fairly soon after your symptoms have started.  I did send out a PCR COVID test today and should have results likely by tomorrow.  In the meantime I do recommend drinking plenty of fluids, rest, Tylenol as needed for fever, body aches or chills.  Mucinex if needed for cough.  If you have any chest pains, shortness of breath (especially at rest) or confusion be seen right away through emergency room or urgent care.  I will be contacting you as soon as I have results of the COVID test and if that is positive would consider prescribing an antiviral medication.  We do need to start that within the first few days of infection so we will be talking to you soon.  In the meantime I do recommend that you try to isolate from others, and wear a mask anytime you do need to be around others.  Please let us know if there are questions.  Fever, Adult   A fever is an increase in the body's temperature. It is usually defined as a temperature of 100.85F (38C) or higher. Brief mild or moderate fevers generally have no long-term effects, and they often do not need treatment. Moderate or high fevers may make you feel uncomfortable and can sometimes be a sign of a serious illness or disease. The sweating that may occur with repeated or prolonged fever may also cause a loss of fluid in the body (dehydration). Fever is confirmed by taking a temperature with a thermometer. A measured temperature can vary with: Age. Time of day. Where in the body you take the temperature. Readings may vary if you place the thermometer: In the mouth (oral). In the rectum (rectal). In the ear (tympanic). Under the arm (axillary). On the forehead (temporal). Follow these instructions at home: Medicines Take over-the counter and prescription medicines only as told by your health  care provider. Follow the dosing instructions carefully. If you were prescribed an antibiotic medicine, take it as told by your health care provider. Do not stop taking the antibiotic even if you start to feel better. General instructions Watch your condition for any changes. Let your health care provider know about them. Rest as needed. Drink enough fluid to keep your urine pale yellow. This helps to prevent dehydration. Sponge yourself or bathe with room-temperature water to help reduce your body temperature as needed. Do not use ice water. Do not use too many blankets or wear clothes that are too heavy. If your fever may be caused by an infection that spreads from person to person (is contagious), such as a cold or the flu, you should stay home from work and public gatherings for at least 24 hours after your fever is gone. Your fever should be gone without the need to use medicines. Contact a health care provider if: You vomit. You cannot eat or drink without vomiting. You have diarrhea. You have pain when you urinate. Your symptoms do not improve with treatment. You develop new symptoms. You develop excessive weakness. Get help right away if: You have shortness of breath or have trouble breathing. You are dizzy or you faint. You are disoriented or confused. You develop signs of dehydration, such as: Dark urine, very little urine, or no urine. Cracked lips. Dry mouth. Sunken eyes. Sleepiness. Weakness. You develop severe pain in  your abdomen. You have persistent vomiting or diarrhea. You develop a skin rash. Your symptoms suddenly get worse. Summary A fever is an increase in the body's temperature. It is usually defined as a temperature of 100.83F (38C) or higher. Moderate or high fevers can sometimes be a sign of a serious illness or disease. The sweating that may occur with repeated or prolonged fever may also cause dehydration. Pay attention to any changes in your symptoms  and contact your health care provider if your symptoms do not improve with treatment. Take over-the counter and prescription medicines only as told by your health care provider. Follow the dosing instructions carefully. If your fever is from an infection that may be contagious, such as cold or flu, you should stay home from work and public gatherings for at least 24 hours after your fever is gone. Your fever should be gone without the need to use medicines. Get help right away if you develop signs of dehydration, such as dark urine, cracked lips, dry mouth, sunken eyes, sleepiness, or weakness. This information is not intended to replace advice given to you by your health care provider. Make sure you discuss any questions you have with your health care provider. Document Revised: 08/27/2020 Document Reviewed: 08/27/2020 Elsevier Patient Education  [place].

## 2021-04-18 ENCOUNTER — Telehealth: Payer: Self-pay | Admitting: Family Medicine

## 2021-04-18 DIAGNOSIS — R52 Pain, unspecified: Secondary | ICD-10-CM

## 2021-04-18 DIAGNOSIS — R509 Fever, unspecified: Secondary | ICD-10-CM

## 2021-04-18 LAB — NOVEL CORONAVIRUS, NAA: SARS-CoV-2, NAA: NOT DETECTED

## 2021-04-18 LAB — SPECIMEN STATUS REPORT

## 2021-04-18 LAB — SARS-COV-2, NAA 2 DAY TAT

## 2021-04-18 MED ORDER — OSELTAMIVIR PHOSPHATE 75 MG PO CAPS
75.0000 mg | ORAL_CAPSULE | Freq: Two times a day (BID) | ORAL | 0 refills | Status: DC
Start: 2021-04-18 — End: 2021-04-29

## 2021-04-18 NOTE — Telephone Encounter (Signed)
Called pt - advised of negative covid test, some back and neck soreness, no new HA/photophobia, phonophobia or neck stiffness and fever resolved. No CP/dyspnea.  Will treat with tamiflu as sx's suspicious for flu with false negative testing. Potential side effects and ER precautions discussed. Understanding expressed.

## 2021-04-22 ENCOUNTER — Other Ambulatory Visit: Payer: Self-pay

## 2021-04-22 ENCOUNTER — Encounter: Payer: Self-pay | Admitting: Physician Assistant

## 2021-04-22 ENCOUNTER — Ambulatory Visit: Payer: Medicare Other | Admitting: Physician Assistant

## 2021-04-22 VITALS — BP 135/78 | HR 53 | Temp 98.2°F | Ht 71.0 in

## 2021-04-22 DIAGNOSIS — M545 Low back pain, unspecified: Secondary | ICD-10-CM

## 2021-04-22 DIAGNOSIS — Z7901 Long term (current) use of anticoagulants: Secondary | ICD-10-CM

## 2021-04-22 LAB — POC URINALSYSI DIPSTICK (AUTOMATED)
Bilirubin, UA: POSITIVE
Blood, UA: NEGATIVE
Glucose, UA: NEGATIVE
Ketones, UA: NEGATIVE
Leukocytes, UA: NEGATIVE
Nitrite, UA: NEGATIVE
Protein, UA: POSITIVE — AB
Spec Grav, UA: 1.025 (ref 1.010–1.025)
Urobilinogen, UA: 1 E.U./dL
pH, UA: 6 (ref 5.0–8.0)

## 2021-04-22 NOTE — Progress Notes (Signed)
Subjective:    Patient ID: William Mahoney, male    DOB: 05-18-1925, 88 y.o.   MRN: [id number]  Chief Complaint  Patient presents with   Back Pain    Left side    HPI Patient is in today for complaints of left sided lower back pain x 1 week. He is here with his wife and daughter. Daughter, Vania Rea, brought him to appointment because she is wondering if he may have a UTI causing his back pain.   Pt was seen on 04/17/21 by Dr. Carlota Raspberry for acute onset of fever, fatigue, chills, "shaking" from being so chilled. He was also complaining of some back and neck soreness at that time as well. His POC flu and COVID-19 tests were negative, but he was treated with Tamiflu for suspicion of false negative testing. He is feeling much better overall aside from the low back pain.  Daughter lives on 2nd floor above patient and his wife and she attests that he never complains about back pain or has any issues in that regard. Only "injury' they can think of was that patient attempted to help his wife out of the bathtub last week and it sounds like it was quite the struggle. She had to really "pull on him" to get herself out of the tub.  Negative for any fevers, saddle anesthesia, foot-drop, incontinence of urine or stool. No falls. No dysuria, hematuria, or urinary frequency.   He is on Coumadin long term for A-fib.   Past Medical History:  Diagnosis Date   BPH (benign prostatic hypertrophy)    Chronic atrial fibrillation [facility])    cardiologist--   dr berry   COPD (chronic obstructive pulmonary disease) ([place])    Diverticulosis of colon    MODERATE LEFT SIDE   Epididymal cyst    w/ epididymalitis   Full dentures    Gout    Hand foot syndrome    secondary to chemotherapy ([place])   cold hands/feet   Hearing loss    History of alcohol abuse    quit drinking in the mid 80's   History of cirrhosis of liver    alcoholic--  hx alcohol abuse -- quit drinking 1980's   History of gout    History of melanoma  excision    2013--  left ear lobe and back of hand   History of pancreatitis    2008   History of rectal cancer oncologist-  dr Benay Spice--  no recurrence   dx Sept 2009--  Stage II (T3N0)  s/p  sigmoid colectomy & low anterior resection 04-18-2008  and chemoradiation 2010   History of shingles 07/27/2010   History of squamous cell carcinoma excision    left lower leg   Hyperlipidemia    Hypertension    Loose stools    DUE TO ANTIBIOTICS   Macular degeneration    not sure which eye   OA (osteoarthritis)    RBBB (right bundle branch block)    Renal lesion    chronic-- left side   Sciatica of right side    Urge urinary incontinence    intermittant    Past Surgical History:  Procedure Laterality Date   CARDIAC CATHETERIZATION  2001  approx.  in [place]  IMPLANT, BILATERAL     COLONOSCOPY  last one 06-27-2012   EXPLORATORY LAPAROTOMY/  LOW ANTERIOR RESECTION/  SIGMOID COLECTOMY  04-18-2008   DR INGRAM   INCISION AND DRAINAGE ABSCESS N/A 11/02/2014  Procedure: INCISION AND DRAINAGE OF SCROTUM;  Surgeon: Ardis Hughs, MD;  Location: [facility];  Service: Urology;  Laterality: N/A;   INGUINAL HERNIA REPAIR Right 03/23/2014   Procedure: HERNIA REPAIR INGUINAL ADULT OPEN REPAIR RIGHT INGUINAL HERNIA REPAIR;  Surgeon: Fanny Skates, MD;  Location: [place];  Service: General;  Laterality: Right;   INSERTION OF MESH Right 03/23/2014   Procedure: INSERTION OF MESH;  Surgeon: Fanny Skates, MD;  Location: [place];  Service: General;  Laterality: Right;   LAPAROSCOPIC CHOLECYSTECTOMY  1990's   MASS EXCISION N/A 11/02/2014   Procedure: EXCISION OF EPIDIDYMAL CYST;  Surgeon: Ardis Hughs, MD;  Location: [facility];  Service: Urology;  Laterality: N/A;   TONSILLECTOMY  as child   TRANSTHORACIC ECHOCARDIOGRAM  05-11-2008   pseudonormal LV filling pattern,  ef 60-65%/  mild to moderate MV calcification no stenosis w/  mild to moderate regurg./  mild LAE and RAE/  mild TR    Family History  Problem Relation Age of Onset   Hypertension Mother    Lymphoma Father    Lymphoma Other    Stroke Other        1st degree relative   Colon cancer Neg Hx    Esophageal cancer Neg Hx    Rectal cancer Neg Hx    Stomach cancer Neg Hx     Social History   Tobacco Use   Smoking status: Former    Packs/day: 1.00    Years: 55.00    Pack years: 55.00    Types: Cigarettes    Quit date: 03/20/2008    Years since quitting: 13.1   Smokeless tobacco: Never  Vaping Use   Vaping Use: Never used  Substance Use Topics   Alcohol use: No    Comment: hx alcohol abuse -- quit in 1980's    Drug use: No     No Known Allergies  Review of Systems NEGATIVE UNLESS OTHERWISE INDICATED IN HPI      Objective:     BP 135/78    Pulse (!) 53    Temp 98.2 F (36.8 C)    Ht 5\' 11"  (1.803 m)    SpO2 97%    BMI 22.09 kg/m   Wt Readings from Last 3 Encounters:  04/17/21 158 lb 6.4 oz (71.8 kg)  02/14/21 158 lb 12.8 oz (72 kg)  12/04/20 154 lb 6.4 oz (70 kg)    BP Readings from Last 3 Encounters:  04/22/21 135/78  04/17/21 118/68  02/14/21 124/79     Physical Exam Vitals reviewed.  Constitutional:      General: He is not in acute distress.    Appearance: Normal appearance. He is well-developed. He is not ill-appearing, toxic-appearing or diaphoretic.  HENT:     Head: Normocephalic and atraumatic.     Right Ear: External ear normal.     Left Ear: External ear normal.     Nose: No rhinorrhea.     Mouth/Throat:     Pharynx: No oropharyngeal exudate or posterior oropharyngeal erythema.  Eyes:     Conjunctiva/sclera: Conjunctivae normal.     Pupils: Pupils are equal, round, and reactive to light.  Cardiovascular:     Rate and Rhythm: Normal rate and regular rhythm.     Heart sounds: Normal heart sounds. No murmur heard. Pulmonary:     Effort: Pulmonary effort is normal.     Breath sounds: Normal breath  sounds. No wheezing, rhonchi or rales.  Abdominal:  General: There is no distension.     Palpations: Abdomen is soft.     Tenderness: There is no abdominal tenderness.  Musculoskeletal:     Cervical back: Neck supple.  Lymphadenopathy:     Cervical: No cervical adenopathy.  Skin:    General: Skin is warm and dry.     Comments: See photo below. Left-sided mid-low back area of ecchymosis with what appears to be a puncture wound from what wife attests is her nail mark (consistent with story of him helping her out of the tub). Area is tender to palpation. No midline bony tenderness. SLR negative.   Neurological:     Mental Status: He is alert and oriented to person, place, and time.  Psychiatric:        Behavior: Behavior normal.          Assessment & Plan:   Problem List Items Addressed This Visit   None Visit Diagnoses     Acute left-sided low back pain without sciatica    -  Primary   Relevant Orders   POCT Urinalysis Dipstick (Automated) (Completed)   Long term (current) use of anticoagulants           1. Acute left-sided low back pain without sciatica 2. Long term (current) use of anticoagulants -I personally reviewed prior notes from Dr. Carlota Raspberry and lab results. -Also checked POC UA for thoroughness today, which was clear. -Glad that his body aches / fever resolved - appears this was probably viral, maybe flu, and Tamiflu helped  -Exam of back consistent with recent "trauma" of helping wife out of the tub - he is on Coumadin, so this makes sense that he would bruise easily with this event. -There are no red flags present. -Overall he does say that his pain has been improving each day since onset. -Reassured that pain should continue to resolve as he heals. -Recommend ice or heat, whichever feels better, Tylenol as needed, and rest. -Needs to be careful with his back and whatever lifting / twisting movements he does. -Plan to recheck prn  This note was prepared  with assistance of Dragon voice recognition software. Occasional wrong-word or sound-a-like substitutions may have occurred due to the inherent limitations of voice recognition software.  Time Spent: 35 minutes of total time was spent on the date of the encounter performing the following actions: chart review prior to seeing the patient, obtaining history, performing a medically necessary exam, counseling on the treatment plan, placing orders, and documenting in our EHR.    Abrahim Sargent M Tahja Liao, PA-C

## 2021-04-28 ENCOUNTER — Telehealth: Payer: Self-pay | Admitting: Family Medicine

## 2021-04-28 NOTE — Telephone Encounter (Signed)
See below

## 2021-04-28 NOTE — Telephone Encounter (Signed)
Pts daughter called and said that the pt has now tested positive for covid, his wife has already been testing positive for covid. The daughter wants to know if they can get a video appt with Dr Yong Channel because she wants to get anti virals for pt and also because she said that they are sending hospice care out to their house and she is confused and scared. Please advise. Callback [phone]

## 2021-04-28 NOTE — Telephone Encounter (Signed)
Yes, ok to schedule virtual.

## 2021-04-28 NOTE — Telephone Encounter (Signed)
States patient tested positive for COVID over the weekend.   Does not want to schedule virtual visit with Dr. Yong Channel.  Is requesting to send a message back to ask for antiviral. I informed daughter that we currently have virtual openings for tomorrow 1/10 and that scheduling the appt would be the faster route for care.  Informed her that more than likely patient will need OV in order to get medication, therefore delaying care.   Daughter still wanted to send message first to get Dr. Ronney Lion response.

## 2021-04-28 NOTE — Telephone Encounter (Signed)
I do recommend virtual visits for covid as noted- please schedule for visit- for wife I had said Wednesday but I could probably have them both (wife and husband) scheduled at 23 20 tomorrow- looking at schedule again- though it may be closer to 5 when I can get on

## 2021-04-29 ENCOUNTER — Other Ambulatory Visit: Payer: Self-pay

## 2021-04-29 ENCOUNTER — Ambulatory Visit: Payer: Medicare Other | Admitting: Podiatry

## 2021-04-29 ENCOUNTER — Telehealth (INDEPENDENT_AMBULATORY_CARE_PROVIDER_SITE_OTHER): Payer: Medicare Other | Admitting: Family Medicine

## 2021-04-29 ENCOUNTER — Encounter: Payer: Self-pay | Admitting: Family Medicine

## 2021-04-29 VITALS — BP 110/76 | Ht 71.0 in | Wt 158.0 lb

## 2021-04-29 DIAGNOSIS — U071 COVID-19: Secondary | ICD-10-CM

## 2021-04-29 DIAGNOSIS — I10 Essential (primary) hypertension: Secondary | ICD-10-CM

## 2021-04-29 MED ORDER — MOLNUPIRAVIR EUA 200MG CAPSULE: 4.0000 | ORAL_CAPSULE | Freq: Two times a day (BID) | ORAL | 0 refills | Status: AC

## 2021-04-29 NOTE — Progress Notes (Signed)
Phone [phone] Virtual visit via Video note   Subjective:  Chief complaint: Chief Complaint  Patient presents with   Acute Home Visit    Covid positive    This visit type was conducted due to national recommendations for restrictions regarding the COVID-19 Pandemic (e.g. social distancing).  This format is felt to be most appropriate for this patient at this time balancing risks to patient and risks to population by having him in for in person visit.  No physical exam was performed (except for noted visual exam or audio findings with Telehealth visits).    Our team/I connected with [name] at  4:40 PM EST by a video enabled telemedicine application (doxy.me or caregility through epic) and verified that I am speaking with the correct person using two identifiers.  Location patient: [facility] Location provider: [facility], office Persons participating in the virtual visit:  patient  Our team/I discussed the limitations of evaluation and management by telemedicine and the availability of in person appointments. In light of current covid-19 pandemic, patient also understands that we are trying to protect them by minimizing in office contact if at all possible.  The patient expressed consent for telemedicine visit and agreed to proceed. Patient understands insurance will be billed.   Past Medical History-  Patient Active Problem List   Diagnosis Date Noted   Edema 11/10/2016    Priority: High   Atrial fibrillation ([place]) 12/07/2008    Priority: High   Chronic kidney disease (CKD), stage III (moderate) ([place]) 02/15/2019    Priority: Medium    Insomnia 10/19/2014    Priority: Medium    Gout 01/09/2014    Priority: Medium    History of cancer of rectosigmoid junction 01/04/2008    Priority: Medium    Former smoker 06/23/2007    Priority: Medium    Hyperlipidemia, unspecified 02/09/2007    Priority: Medium    Essential hypertension 12/07/2006    Priority: Medium    COPD  (chronic obstructive pulmonary disease) ([place]) 12/07/2006    Priority: Medium    BPH (benign prostatic hyperplasia) 12/07/2006    Priority: Medium    Allergic rhinitis 08/15/2013    Priority: Low   Arthritis of lumbar spine 01/20/2013    Priority: Low   Inguinal hernia 03/21/2010    Priority: Low   BASAL CELL CARCINOMA SKIN LOWER LIMB INCL HIP 03/21/2010    Priority: Low   Actinic keratosis 12/07/2008    Priority: Low   TMJ (temporomandibular joint disorder) 02/09/2007    Priority: Low   URINARY INCONTINENCE 12/07/2006    Priority: Low   PANCREATITIS, HX OF 12/07/2006    Priority: Low   AAA (abdominal aortic aneurysm) without rupture 11/20/2019   PAD (peripheral artery disease) ([place]) 11/20/2019   Aortic atherosclerosis ([place]) 01/05/2019   Thrombocytopenia ([place]) 11/10/2016   Encounter for therapeutic drug monitoring 08/27/2014    Medications- reviewed and updated Current Outpatient Medications  Medication Sig Dispense Refill   allopurinol (ZYLOPRIM) 300 MG tablet TAKE ONE-HALF TABLET BY MOUTH IN THE EVENING 45 tablet 1   amLODipine (NORVASC) 2.5 MG tablet TAKE 1 TABLET BY MOUTH EVERY DAY 90 tablet 1   colchicine 0.6 MG tablet Take 2 tablets (1.2 mg) at the first sign of flare, followed by 0.6 mg after 1 hour if pain not resolved. Then take once daily after that until pain resolves. Continue allopurinol during gout flares. 180 tablet 2   fluticasone (FLONASE) 50 MCG/ACT nasal spray Place 2 sprays into both nostrils  daily. 48 g 3   furosemide (LASIX) 20 MG tablet TAKE 1 TABLET BY MOUTH EVERY DAY AS NEEDED 90 tablet 1   metoprolol tartrate (LOPRESSOR) 25 MG tablet Take 0.5 tablets (12.5 mg total) by mouth daily. 90 tablet 1   molnupiravir EUA (LAGEVRIO) 200 mg CAPS capsule Take 4 capsules (800 mg total) by mouth 2 (two) times daily for 5 days. 40 capsule 0   Multiple Vitamin (MULTIVITAMIN) tablet Take 1 tablet by mouth daily.     tamsulosin (FLOMAX) 0.4 MG CAPS capsule TAKE 1 CAPSULE  BY MOUTH EVERY DAY 90 capsule 1   triamterene-hydrochlorothiazide (MAXZIDE-25) 37.5-25 MG tablet TAKE 1/2 TABLET BY MOUTH EVERY DAY 45 tablet 1   warfarin (COUMADIN) 2.5 MG tablet TAKE 1 TABLET DAILY EXCEPT TAKE 1 AND 1/2 TABLETS ON WED AND SAT OR TAKE AS DIRECTED BY CLINIC (Patient taking differently: Take 2.5 mg by mouth. TAKE 1 TABLET DAILY EXCEPT TAKE 1 AND 1/2 TABLETS ON SAT OR TAKE AS DIRECTED BY CLINIC) 120 tablet 1   zolpidem (AMBIEN) 5 MG tablet Take 0.5 tablets (2.5 mg total) by mouth at bedtime as needed for sleep. 15 tablet 5   No current facility-administered medications for this visit.     Objective:  BP 110/76    Ht 5\' 11"  (1.803 m)    Wt 158 lb (71.7 kg)    SpO2 98%    BMI 22.04 kg/m  self reported vitals Gen: NAD, resting comfortably Lungs: nonlabored, normal respiratory rate  Skin: appears dry, no obvious rash     Assessment and Plan   # [id number] Infection S: Patient reports that he had tested positive for COVID over the weekend. Symptoms include mild sore throat within last 5 days- minor. Started on Friday of last week. Also has mild runny nose but known allergies, some increased muscle aches in neck- but had been hurting from the flu  No fever, chills, cough, congestion, runny nose (same as baseline), shortness of breath, fatigue, body aches, sore throat, headache, nausea, vomiting, diarrhea, or new loss of taste or smell.   A/P: Patient with testing confirming covid 19 with first day of covid 19 symptoms 04/25/21 Vaccination status: original 2 vaccinations-has not received booster   Therefore: - recommended patient watch closely for shortness of breath or confusion or worsening symptoms and if those occur patient should contact us immediately or seek care in the emergency department -recommended patient consider purchasing pulse oximeter and if levels 94% or below persistently- seek care at the hospital - Patient needs to self isolate  for at least 5 days since first  symptom AND at least 24 hours fever free without fever reducing medications AND have improvement in respiratory symptoms . After 5 days can end self isolation but still needs to wear mask for additional 5 days .  -Patient should inform close contacts about exposure (anyone patient been around unmasked for more than 15 minutes)  - we opted for molnupiravir (no interaction fi needs colchicine) - keep pushing fluids to stay well hydrated  #hypertension S: medication: amlodipine 2.5 mg, metoprolol 12.5 mg BID, triamterene hctz 37.5-25mg  half tablet BP Readings from Last 3 Encounters:  04/29/21 110/76  04/22/21 135/78  04/17/21 118/68  A/P: Blood pressure is well controlled.  On the main issues I have seen with COVID is becoming dehydrated and with blood pressure running on the lower side of normal-we opted to hold his half tablet of triamterene hctz 37.5-25mg  for now but restart if blood pressure  gets above 140/90 at home   Recommended follow up: As needed for new or worsening symptoms or if symptoms fail to improve Future Appointments  Date Time Provider [place]  05/14/2021 11:00 AM [facility]  05/29/2021  3:40 PM Marin Olp, MD [facility]  12/26/2021 11:00 AM [facility] HEALTH COACH [facility]    Lab/Order associations:   ICD-10-CM   1. COVID-19  U07.1     2. Essential hypertension  I10       Meds ordered this encounter  Medications   molnupiravir EUA (LAGEVRIO) 200 mg CAPS capsule    Sig: Take 4 capsules (800 mg total) by mouth 2 (two) times daily for 5 days.    Dispense:  40 capsule    Refill:  0    Return precautions advised.  [name], MD

## 2021-04-29 NOTE — Patient Instructions (Signed)
Health Maintenance Due  Topic Date Due   Zoster Vaccines- Shingrix (1 of 2) Never done   COVID-19 Vaccine (3 - Pfizer risk series) 06/19/2019    Recommended follow up: No follow-ups on file.

## 2021-04-29 NOTE — Telephone Encounter (Signed)
Patient is scheduled for this evening.

## 2021-05-10 ENCOUNTER — Encounter (HOSPITAL_BASED_OUTPATIENT_CLINIC_OR_DEPARTMENT_OTHER): Payer: Self-pay

## 2021-05-10 ENCOUNTER — Other Ambulatory Visit: Payer: Self-pay

## 2021-05-10 ENCOUNTER — Emergency Department (HOSPITAL_BASED_OUTPATIENT_CLINIC_OR_DEPARTMENT_OTHER)
Admission: EM | Admit: 2021-05-10 | Discharge: 2021-05-10 | Disposition: A | Discharge status: Home / Self Care | Payer: Medicare Other | Attending: Emergency Medicine | Admitting: Emergency Medicine

## 2021-05-10 DIAGNOSIS — M7989 Other specified soft tissue disorders: Secondary | ICD-10-CM | POA: Insufficient documentation

## 2021-05-10 DIAGNOSIS — Z5321 Procedure and treatment not carried out due to patient leaving prior to being seen by health care provider: Secondary | ICD-10-CM | POA: Diagnosis not present

## 2021-05-10 NOTE — ED Notes (Signed)
When entering room NT asked to re-check vitals and family member refused for pt. NT asked pt to get into hospital gown and family member decided to take pt and leave.

## 2021-05-10 NOTE — ED Triage Notes (Signed)
Patient here POV from Home with Finger Swelling.  Patient has had Left Middle/Ring Swelling and Irritation for approximately 3-4 days. Patient is unsure if it related to recent contact with Fluocinonide or Bodily Fluids.  No Fevers. Pain to Same.  NAD Noted during Triage. A&Ox4. GCS 15. Ambulatory.

## 2021-05-10 NOTE — ED Notes (Signed)
Pt left without being scene

## 2021-05-12 ENCOUNTER — Ambulatory Visit: Payer: Medicare Other | Admitting: Physician Assistant

## 2021-05-12 ENCOUNTER — Other Ambulatory Visit: Payer: Self-pay

## 2021-05-12 ENCOUNTER — Encounter: Payer: Self-pay | Admitting: Physician Assistant

## 2021-05-12 VITALS — BP 110/70 | HR 56 | Temp 97.7°F | Ht 71.0 in | Wt 156.2 lb

## 2021-05-12 DIAGNOSIS — L249 Irritant contact dermatitis, unspecified cause: Secondary | ICD-10-CM

## 2021-05-12 MED ORDER — CEPHALEXIN 500 MG PO CAPS: 500.0000 mg | ORAL_CAPSULE | Freq: Four times a day (QID) | ORAL | 0 refills | Status: DC

## 2021-05-12 NOTE — Patient Instructions (Signed)
It was great to see you!  I think you have contact dermatitis -- possible early infection in your hand from something that got on it  Please start the oral antibiotic, the keflex, for 5 days.  Please let me or Dr Yong Channel know if You have a fever. You do not start to get better after 1-2 days of treatment. Your bone or joint under the infected area starts to hurt after the skin has healed. Your infection comes back. This can happen in the same area or another area. You have a swollen bump in the area. You have new symptoms. You feel ill and have muscle aches and pains.  Take care,  Inda Coke PA-C

## 2021-05-12 NOTE — Progress Notes (Signed)
William Mahoney is a 88 y.o. male here for a finger problem.  History of Present Illness:   Chief Complaint  Patient presents with   finger problem    Pt c/o swelling and pain left middle and ring finger x 1 week. Pt said he thinks he got from medication Fluocinonide.    HPI  Finger Problem [name] presents with c/o irritation in left middle and ring finger that has been onset for a week. States that he did use his external fluocinonide solution and noticed a burning sensation upon dropping a small amount in the web space of his middle and ring finger. He did clean this immediately and didn't notice any opening in his skin at that time. Due to redness and tightening feeling when closing his hand, he believed he had an infection. Pt is unsure if external solution caused sx. He did visit the ED on 05/10/21, but did not stay for evaluation due to wait time. Denies fever, chills, swelling, drainage, or use of new hygenic products.   Past Medical History:  Diagnosis Date   BPH (benign prostatic hypertrophy)    Chronic atrial fibrillation [facility])    cardiologist--   dr berry   COPD (chronic obstructive pulmonary disease) ([place])    Diverticulosis of colon    MODERATE LEFT SIDE   Epididymal cyst    w/ epididymalitis   Full dentures    Gout    Hand foot syndrome    secondary to chemotherapy ([place])   cold hands/feet   Hearing loss    History of alcohol abuse    quit drinking in the mid 80's   History of cirrhosis of liver    alcoholic--  hx alcohol abuse -- quit drinking 1980's   History of gout    History of melanoma excision    2013--  left ear lobe and back of hand   History of pancreatitis    2008   History of rectal cancer oncologist-  dr Benay Spice--  no recurrence   dx Sept 2009--  Stage II (T3N0)  s/p  sigmoid colectomy & low anterior resection 04-18-2008  and chemoradiation 2010   History of shingles 07/27/2010   History of squamous cell carcinoma excision    left lower leg    Hyperlipidemia    Hypertension    Loose stools    DUE TO ANTIBIOTICS   Macular degeneration    not sure which eye   OA (osteoarthritis)    RBBB (right bundle branch block)    Renal lesion    chronic-- left side   Sciatica of right side    Urge urinary incontinence    intermittant     Social History   Tobacco Use   Smoking status: Former    Packs/day: 1.00    Years: 55.00    Pack years: 55.00    Types: Cigarettes    Quit date: 03/20/2008    Years since quitting: 13.1   Smokeless tobacco: Never  Vaping Use   Vaping Use: Never used  Substance Use Topics   Alcohol use: No    Comment: hx alcohol abuse -- quit in 1980's    Drug use: No    Past Surgical History:  Procedure Laterality Date   CARDIAC CATHETERIZATION  2001  approx.  in [place]     COLONOSCOPY  last one 06-27-2012   EXPLORATORY LAPAROTOMY/  LOW ANTERIOR RESECTION/  SIGMOID COLECTOMY  04-18-2008  DR Dalbert Batman   INCISION AND DRAINAGE ABSCESS N/A 11/02/2014   Procedure: INCISION AND DRAINAGE OF SCROTUM;  Surgeon: Ardis Hughs, MD;  Location: [facility];  Service: Urology;  Laterality: N/A;   INGUINAL HERNIA REPAIR Right 03/23/2014   Procedure: HERNIA REPAIR INGUINAL ADULT OPEN REPAIR RIGHT INGUINAL HERNIA REPAIR;  Surgeon: Fanny Skates, MD;  Location: [facility];  Service: General;  Laterality: Right;   INSERTION OF MESH Right 03/23/2014   Procedure: INSERTION OF MESH;  Surgeon: Fanny Skates, MD;  Location: [facility];  Service: General;  Laterality: Right;   LAPAROSCOPIC CHOLECYSTECTOMY  1990's   MASS EXCISION N/A 11/02/2014   Procedure: EXCISION OF EPIDIDYMAL CYST;  Surgeon: Ardis Hughs, MD;  [facility];  Service: Urology;  Laterality: N/A;   TONSILLECTOMY  as child   TRANSTHORACIC ECHOCARDIOGRAM  05-11-2008   pseudonormal LV filling pattern,  ef 60-65%/  mild to moderate MV calcification no stenosis w/ mild  to moderate regurg./  mild LAE and RAE/  mild TR    Family History  Problem Relation Age of Onset   Hypertension Mother    Lymphoma Father    Lymphoma Other    Stroke Other        1st degree relative   Colon cancer Neg Hx    Esophageal cancer Neg Hx    Rectal cancer Neg Hx    Stomach cancer Neg Hx     No Known Allergies  Current Medications:   Current Outpatient Medications:    allopurinol (ZYLOPRIM) 300 MG tablet, TAKE ONE-HALF TABLET BY MOUTH IN THE EVENING, Disp: 45 tablet, Rfl: 1   amLODipine (NORVASC) 2.5 MG tablet, TAKE 1 TABLET BY MOUTH EVERY DAY, Disp: 90 tablet, Rfl: 1   colchicine 0.6 MG tablet, Take 2 tablets (1.2 mg) at the first sign of flare, followed by 0.6 mg after 1 hour if pain not resolved. Then take once daily after that until pain resolves. Continue allopurinol during gout flares., Disp: 180 tablet, Rfl: 2   fluocinonide (LIDEX) 0.05 % external solution, SMARTSIG:1 Milliliter(s) Topical Twice Daily, Disp: , Rfl:    fluticasone (FLONASE) 50 MCG/ACT nasal spray, Place 2 sprays into both nostrils daily., Disp: 48 g, Rfl: 3   furosemide (LASIX) 20 MG tablet, TAKE 1 TABLET BY MOUTH EVERY DAY AS NEEDED, Disp: 90 tablet, Rfl: 1   metoprolol tartrate (LOPRESSOR) 25 MG tablet, Take 0.5 tablets (12.5 mg total) by mouth daily., Disp: 90 tablet, Rfl: 1   Multiple Vitamin (MULTIVITAMIN) tablet, Take 1 tablet by mouth daily., Disp: , Rfl:    tamsulosin (FLOMAX) 0.4 MG CAPS capsule, TAKE 1 CAPSULE BY MOUTH EVERY DAY, Disp: 90 capsule, Rfl: 1   triamterene-hydrochlorothiazide (MAXZIDE-25) 37.5-25 MG tablet, TAKE 1/2 TABLET BY MOUTH EVERY DAY, Disp: 45 tablet, Rfl: 1   warfarin (COUMADIN) 2.5 MG tablet, TAKE 1 TABLET DAILY EXCEPT TAKE 1 AND 1/2 TABLETS ON WED AND SAT OR TAKE AS DIRECTED BY CLINIC (Patient taking differently: Take 2.5 mg by mouth. TAKE 1 TABLET DAILY EXCEPT TAKE 1 AND 1/2 TABLETS ON SAT OR TAKE AS DIRECTED BY CLINIC), Disp: 120 tablet, Rfl: 1   zolpidem (AMBIEN) 5  MG tablet, Take 0.5 tablets (2.5 mg total) by mouth at bedtime as needed for sleep., Disp: 15 tablet, Rfl: 5   Review of Systems:   ROS Negative unless otherwise specified per HPI.  Vitals:   Vitals:   05/12/21 1141  BP: 110/70  Pulse: (!) 56  Temp:  97.7 F (36.5 C)  TempSrc: Temporal  Weight: 156 lb 4 oz (70.9 kg)  Height: 5\' 11"  (1.803 m)     Body mass index is 21.79 kg/m.  Physical Exam:   Physical Exam Vitals and nursing note reviewed.  Constitutional:      Appearance: He is well-developed.  HENT:     Head: Normocephalic.  Eyes:     Conjunctiva/sclera: Conjunctivae normal.     Pupils: Pupils are equal, round, and reactive to light.  Cardiovascular:     Comments: Normal cap refill in fingers Pulmonary:     Effort: Pulmonary effort is normal.  Musculoskeletal:        General: Normal range of motion.     Cervical back: Normal range of motion.     Comments: Interdigital proximal erythema between third and fourth finger  No significant TTP to phalanges or joints Slight decrease in ROM with flexion of these fingers due to pain   Skin:    General: Skin is warm and dry.  Neurological:     Mental Status: He is alert and oriented to person, place, and time.     Comments: Normal sensation to fingers  Psychiatric:        Behavior: Behavior normal.        Thought Content: Thought content normal.        Judgment: Judgment normal.    Assessment and Plan:   Irritant Dermatitis No red flags on exam, suspect contact dermatitis with possible early cellulitis Start keflex 500 mg four times daily x 5 days Informed patient that if they experience new or worsening symptoms such fever, increased pain, or muscle aches to reach out immediately  Consider hand xray -- he declined this at this time but verbalized understanding that if this worsens, we will be obtaining xrays Patient also briefly seen and examined by Dr. Sumner Boast C Ratchford,acting as a scribe  for Inda Coke, PA.,have documented all relevant documentation on the behalf of Inda Coke, PA,as directed by  Inda Coke, PA while in the presence of Inda Coke, [place].  I, Inda Coke, [place], have reviewed all documentation for this visit. The documentation on 05/12/21 for the exam, diagnosis, procedures, and orders are all accurate and complete.     Inda Coke, PA-C

## 2021-05-14 ENCOUNTER — Ambulatory Visit: Payer: Medicare Other

## 2021-05-22 ENCOUNTER — Telehealth: Payer: Self-pay

## 2021-05-22 NOTE — Telephone Encounter (Signed)
Pt cancelled apt for 1/31 and has not called to RS apt. Tried to contact pt on two numbers, one reports it is no longer in service and the other, no answer and no VM.

## 2021-05-28 ENCOUNTER — Ambulatory Visit: Payer: Medicare Other

## 2021-05-28 DIAGNOSIS — Z7901 Long term (current) use of anticoagulants: Secondary | ICD-10-CM | POA: Diagnosis not present

## 2021-05-28 LAB — POCT INR: INR: 1.8 — AB (ref 2.0–3.0)

## 2021-05-28 NOTE — Progress Notes (Addendum)
Take 1 1/2 tablets today and then change weekly dose to take 1 tablet daily except take 1 1/2 tablets on Tuesdays and Saturdays.  Re-check in 3 wks.

## 2021-05-28 NOTE — Patient Instructions (Addendum)
Pre visit review using our clinic review tool, if applicable. No additional management support is needed unless otherwise documented below in the visit note.  Take 1 1/2 tablets today and then change weekly dose to take 1 tablet daily except take 1 1/2 tablets on Tuesdays and Saturdays.  Re-check in 3 wks.

## 2021-05-28 NOTE — Telephone Encounter (Signed)
Contacted Kristin to RS pt for today at [facility]. She reported pt can go to [facility] or [facility] location.

## 2021-05-29 ENCOUNTER — Ambulatory Visit: Payer: Medicare Other | Admitting: Family Medicine

## 2021-06-05 ENCOUNTER — Telehealth: Payer: Self-pay

## 2021-06-05 MED ORDER — FUROSEMIDE 20 MG PO TABS: 20.0000 mg | ORAL_TABLET | Freq: Every day | ORAL | 1 refills | Status: DC | PRN

## 2021-06-05 NOTE — Telephone Encounter (Signed)
Medication refilled

## 2021-06-05 NOTE — Telephone Encounter (Signed)
..   Encourage patient to contact the pharmacy for refills or they can request refills through [facility]: 05/29/21  NEXT APPOINTMENT DATE:  07/10/21  MEDICATION:  furosemide   Is the patient out of medication?   PHARMACY:  [facility] at [address]   Let patient know to contact pharmacy at the end of the day to make sure medication is ready.  Please notify patient to allow 48-72 hours to process  CLINICAL FILLS OUT ALL BELOW:   LAST REFILL:  QTY:  REFILL DATE:    OTHER COMMENTS:    Okay for refill?  Please advise

## 2021-06-18 ENCOUNTER — Ambulatory Visit: Payer: Medicare Other

## 2021-06-19 ENCOUNTER — Encounter: Payer: Self-pay | Admitting: Neurology

## 2021-06-23 NOTE — Progress Notes (Incomplete)
? ?Phone [phone] ?In person visit ?  ?Subjective:  ? ?William Mahoney is a 88 y.o. year old very pleasant male patient who presents for/with See problem oriented charting ?No chief complaint on file. ? ? ?This visit occurred during the SARS-CoV-2 public health emergency.  Safety protocols were in place, including screening questions prior to the visit, additional usage of staff PPE, and extensive cleaning of exam room while observing appropriate contact time as indicated for disinfecting solutions.  ? ?Past Medical History-  ?Patient Active Problem List  ? Diagnosis Date Noted  ? AAA (abdominal aortic aneurysm) without rupture 11/20/2019  ? PAD (peripheral artery disease) ([place]) 11/20/2019  ? Chronic kidney disease (CKD), stage III (moderate) ([place]) 02/15/2019  ? Aortic atherosclerosis ([place]) 01/05/2019  ? Thrombocytopenia ([place]) 11/10/2016  ? Edema 11/10/2016  ? Insomnia 10/19/2014  ? Encounter for therapeutic drug monitoring 08/27/2014  ? Gout 01/09/2014  ? Allergic rhinitis 08/15/2013  ? Arthritis of lumbar spine 01/20/2013  ? Inguinal hernia 03/21/2010  ? BASAL CELL CARCINOMA SKIN LOWER LIMB INCL HIP 03/21/2010  ? Atrial fibrillation ([place]) 12/07/2008  ? Actinic keratosis 12/07/2008  ? History of cancer of rectosigmoid junction 01/04/2008  ? Former smoker 06/23/2007  ? Hyperlipidemia, unspecified 02/09/2007  ? TMJ (temporomandibular joint disorder) 02/09/2007  ? Essential hypertension 12/07/2006  ? COPD (chronic obstructive pulmonary disease) ([place]) 12/07/2006  ? BPH (benign prostatic hyperplasia) 12/07/2006  ? URINARY INCONTINENCE 12/07/2006  ? PANCREATITIS, HX OF 12/07/2006  ? ? ?Medications- reviewed and updated ?Current Outpatient Medications  ?Medication Sig Dispense Refill  ? allopurinol (ZYLOPRIM) 300 MG tablet TAKE ONE-HALF TABLET BY MOUTH IN THE EVENING 45 tablet 1  ? amLODipine (NORVASC) 2.5 MG tablet TAKE 1 TABLET BY MOUTH EVERY DAY 90 tablet 1  ? cephALEXin (KEFLEX) 500 MG capsule Take 1 capsule (500  mg total) by mouth 4 (four) times daily. 20 capsule 0  ? colchicine 0.6 MG tablet Take 2 tablets (1.2 mg) at the first sign of flare, followed by 0.6 mg after 1 hour if pain not resolved. Then take once daily after that until pain resolves. Continue allopurinol during gout flares. 180 tablet 2  ? fluocinonide (LIDEX) 0.05 % external solution SMARTSIG:1 Milliliter(s) Topical Twice Daily    ? fluticasone (FLONASE) 50 MCG/ACT nasal spray Place 2 sprays into both nostrils daily. 48 g 3  ? furosemide (LASIX) 20 MG tablet Take 1 tablet (20 mg total) by mouth daily as needed. 90 tablet 1  ? metoprolol tartrate (LOPRESSOR) 25 MG tablet Take 0.5 tablets (12.5 mg total) by mouth daily. 90 tablet 1  ? Multiple Vitamin (MULTIVITAMIN) tablet Take 1 tablet by mouth daily.    ? tamsulosin (FLOMAX) 0.4 MG CAPS capsule TAKE 1 CAPSULE BY MOUTH EVERY DAY 90 capsule 1  ? triamterene-hydrochlorothiazide (MAXZIDE-25) 37.5-25 MG tablet TAKE 1/2 TABLET BY MOUTH EVERY DAY 45 tablet 1  ? warfarin (COUMADIN) 2.5 MG tablet TAKE 1 TABLET DAILY EXCEPT TAKE 1 AND 1/2 TABLETS ON WED AND SAT OR TAKE AS DIRECTED BY CLINIC (Patient taking differently: Take 2.5 mg by mouth. TAKE 1 TABLET DAILY EXCEPT TAKE 1 AND 1/2 TABLETS ON SAT OR TAKE AS DIRECTED BY CLINIC) 120 tablet 1  ? zolpidem (AMBIEN) 5 MG tablet Take 0.5 tablets (2.5 mg total) by mouth at bedtime as needed for sleep. 15 tablet 5  ? ?No current facility-administered medications for this visit.  ? ?  ?Objective:  ?There were no vitals taken for this visit. ?Gen: NAD, resting comfortably ?CV:  RRR no murmurs rubs or gallops ?Lungs: CTAB no crackles, wheeze, rhonchi ?Abdomen: soft/nontender/nondistended/normal bowel sounds. No rebound or guarding.  ?Ext: no edema ?Skin: warm, dry ?Neuro: grossly normal, moves all extremities ? ?*** ?  ? ?Assessment and Plan  ? ?Medicare AWVS: 12/09/2018 *** ?cpe 10/05/19 *** ? ?eye injections *** ?repeat aneurysm study ***Abdominal aortic aneurysm-slight increase  in size of aneurysm up to 4.6 cm from 4.3 cm but these are different imaging tests-could certainly recheck in 6 months or refer to vascular surgery if he would like on 11/28/2020 ? ? ?# Leg swelling / pain  ?S:ED visit 11/28/2020 for acute left ankle pain. Had sport medicine referral - seen Dr. Georgina Snell on 12/04/20 he suspected potential gout and offered aspiration and injections- pt opted to continue monitoring. Pain improved on 02/15/2021 visit and considered to opt out of sports medicine around the time.   ?A/P: ***  ? ? ?# Atrial fibrillation ?S: Rate controlled with metoprolol 25 mg daily- IR per Dr.Berry since patient has done well with this- takes half a tablet daily per report  ?Anticoagulated with coumadin through [facility] clinic  ?Patient was followed by cardiology: Dr. Gwenlyn Found  ?A/P: ***gout ?  ?#hypertension ?S: medication: Maxzide 37.5-25 mg daily,  amlodipine 2.5 mg daily, metoprolol 12.5 mg daily, Lasix 20 mg as needed- not sure how much this is helping  ?Home readings #s: *** ?BP Readings from Last 3 Encounters:  ?05/12/21 110/70  ?05/10/21 128/85  ?04/29/21 110/76  ?A/P: *** ? ?#Chronic kidney disease stage III ?S: GFR is typically in the 50s *** range ?-Patient knows to avoid NSAIDs  ?A/P: *** ?  ?#Gout ?S: Medication: On allopurinol 300 mg; colchicine 0.'6Mg'$  on hand if flared ?Lab Results  ?Component Value Date  ? LABURIC 5.8 12/04/2020  ? ?A/P:***  ?  ?# insomnia ?S:ambien 2.'5mg'$  as needed. Up to 4-5 days a week.  ?-failed trazodone in 2019 opted to hold off around the the time with acute pain. Advised to avoid Tylenol PM.  ?A/P: *** ?  ?# COPD- stable without meds- will monitor ?  ?#infrarenal aortic aneurysm- stable 11/01/19 and monitor yearly "Abdominal Aorta: There was evidence of abnormal dilatation of the distal  ?Abdominal aorta. There was evidence of abnormal dilation of the Left Common  ?Iliac artery. The largest aortic measurement is 4.2 cm. The largest aortic  ?diameter remains essentially  ?unchanged  compared to prior exam. Previous diameter measurement was 4.3 cm  ?obtained on CT 10/2018. "   ?Recommended follow up: No follow-ups on file. ?Future Appointments  ?Date Time Provider [place]  ?06/25/2021  3:45 PM [facility] COUMADIN [facility]  ?07/10/2021  2:40 PM Marin Olp, MD [facility]  ?08/15/2021  2:50 PM Pieter Partridge, DO [facility] None  ?12/26/2021 11:00 AM [facility] HEALTH COACH [facility]  ? ? ?Lab/Order associations: ?No diagnosis found. ? ?No orders of the defined types were placed in this encounter. ? ?I,Jada Bradford,acting as a scribe for [name], MD.,have documented all relevant documentation on the behalf of [name], MD,as directed by  [name], MD while in the presence of [name], MD. ? ?*** ? ?Return precautions advised.  ?William Mahoney ? ? ?

## 2021-06-24 ENCOUNTER — Other Ambulatory Visit: Payer: Self-pay

## 2021-06-24 ENCOUNTER — Encounter: Payer: Self-pay | Admitting: Family Medicine

## 2021-06-24 ENCOUNTER — Ambulatory Visit: Payer: Medicare Other | Admitting: Family Medicine

## 2021-06-24 VITALS — BP 124/72 | HR 52 | Temp 97.5°F | Ht 71.0 in | Wt 155.8 lb

## 2021-06-24 DIAGNOSIS — R202 Paresthesia of skin: Secondary | ICD-10-CM | POA: Diagnosis not present

## 2021-06-24 DIAGNOSIS — J449 Chronic obstructive pulmonary disease, unspecified: Secondary | ICD-10-CM

## 2021-06-24 DIAGNOSIS — D696 Thrombocytopenia, unspecified: Secondary | ICD-10-CM

## 2021-06-24 DIAGNOSIS — I10 Essential (primary) hypertension: Secondary | ICD-10-CM

## 2021-06-24 DIAGNOSIS — I4891 Unspecified atrial fibrillation: Secondary | ICD-10-CM | POA: Diagnosis not present

## 2021-06-24 DIAGNOSIS — E785 Hyperlipidemia, unspecified: Secondary | ICD-10-CM | POA: Diagnosis not present

## 2021-06-24 DIAGNOSIS — N1831 Chronic kidney disease, stage 3a: Secondary | ICD-10-CM

## 2021-06-24 LAB — CBC WITH DIFFERENTIAL/PLATELET
Basophils Absolute: 0 10*3/uL (ref 0.0–0.1)
Basophils Relative: 0.4 % (ref 0.0–3.0)
Eosinophils Absolute: 0.1 10*3/uL (ref 0.0–0.7)
Eosinophils Relative: 2.5 % (ref 0.0–5.0)
HCT: 41.1 % (ref 39.0–52.0)
Hemoglobin: 13.5 g/dL (ref 13.0–17.0)
Lymphocytes Relative: 22.8 % (ref 12.0–46.0)
Lymphs Abs: 1.1 10*3/uL (ref 0.7–4.0)
MCHC: 33 g/dL (ref 30.0–36.0)
MCV: 92.4 fl (ref 78.0–100.0)
Monocytes Absolute: 0.7 10*3/uL (ref 0.1–1.0)
Monocytes Relative: 15 % — ABNORMAL HIGH (ref 3.0–12.0)
Neutro Abs: 2.8 10*3/uL (ref 1.4–7.7)
Neutrophils Relative %: 59.3 % (ref 43.0–77.0)
Platelets: 109 10*3/uL — ABNORMAL LOW (ref 150.0–400.0)
RBC: 4.45 Mil/uL (ref 4.22–5.81)
RDW: 15.3 % (ref 11.5–15.5)
WBC: 4.7 10*3/uL (ref 4.0–10.5)

## 2021-06-24 LAB — COMPREHENSIVE METABOLIC PANEL
ALT: 10 U/L (ref 0–53)
AST: 20 U/L (ref 0–37)
Albumin: 4.1 g/dL (ref 3.5–5.2)
Alkaline Phosphatase: 70 U/L (ref 39–117)
BUN: 24 mg/dL — ABNORMAL HIGH (ref 6–23)
CO2: 32 mEq/L (ref 19–32)
Calcium: 9.3 mg/dL (ref 8.4–10.5)
Chloride: 100 mEq/L (ref 96–112)
Creatinine, Ser: 1.2 mg/dL (ref 0.40–1.50)
GFR: 52.06 mL/min — ABNORMAL LOW (ref >60.00)
Glucose, Bld: 99 mg/dL (ref 70–99)
Potassium: 3.7 mEq/L (ref 3.5–5.1)
Sodium: 139 mEq/L (ref 135–145)
Total Bilirubin: 0.7 mg/dL (ref 0.2–1.2)
Total Protein: 6.5 g/dL (ref 6.0–8.3)

## 2021-06-24 LAB — VITAMIN B12: Vitamin B-12: 964 pg/mL — ABNORMAL HIGH (ref 211–911)

## 2021-06-24 LAB — HEMOGLOBIN A1C: Hgb A1c MFr Bld: 6.3 % (ref 4.6–6.5)

## 2021-06-24 LAB — TSH: TSH: 4.27 u[IU]/mL (ref 0.35–5.50)

## 2021-06-24 NOTE — Patient Instructions (Addendum)
Please stop by lab before you go ?If you have mychart- we will send your results within 3 business days of [place] receiving them.  ?If you do not have mychart- we will call you about results within 5 business days of [place] receiving them.  ?*please also note that you will see labs on mychart as soon as they post. I will later go in and write notes on them- will say "notes from Dr. Yong Channel"  ? ?No changes today ? ?Recommended follow up: Return in about 3 months (around 09/24/2021) for follow up- or sooner if needed. ?

## 2021-06-24 NOTE — Progress Notes (Signed)
?Phone [phone] ?In person visit ?  ?Subjective:  ? ?William Mahoney is a 88 y.o. year old very pleasant male patient who presents for/with See problem oriented charting ?Chief Complaint  ?Patient presents with  ? Follow-up  ? ?This visit occurred during the SARS-CoV-2 public health emergency.  Safety protocols were in place, including screening questions prior to the visit, additional usage of staff PPE, and extensive cleaning of exam room while observing appropriate contact time as indicated for disinfecting solutions.  ? ?Past Medical History-  ?Patient Active Problem List  ? Diagnosis Date Noted  ? Edema 11/10/2016  ?  Priority: High  ? Atrial fibrillation ([place]) 12/07/2008  ?  Priority: High  ? Chronic kidney disease (CKD), stage III (moderate) ([place]) 02/15/2019  ?  Priority: Medium   ? Insomnia 10/19/2014  ?  Priority: Medium   ? Gout 01/09/2014  ?  Priority: Medium   ? History of cancer of rectosigmoid junction 01/04/2008  ?  Priority: Medium   ? Former smoker 06/23/2007  ?  Priority: Medium   ? Hyperlipidemia, unspecified 02/09/2007  ?  Priority: Medium   ? Essential hypertension 12/07/2006  ?  Priority: Medium   ? COPD (chronic obstructive pulmonary disease) ([place]) 12/07/2006  ?  Priority: Medium   ? BPH (benign prostatic hyperplasia) 12/07/2006  ?  Priority: Medium   ? Allergic rhinitis 08/15/2013  ?  Priority: Low  ? Arthritis of lumbar spine 01/20/2013  ?  Priority: Low  ? Inguinal hernia 03/21/2010  ?  Priority: Low  ? BASAL CELL CARCINOMA SKIN LOWER LIMB INCL HIP 03/21/2010  ?  Priority: Low  ? Actinic keratosis 12/07/2008  ?  Priority: Low  ? TMJ (temporomandibular joint disorder) 02/09/2007  ?  Priority: Low  ? URINARY INCONTINENCE 12/07/2006  ?  Priority: Low  ? PANCREATITIS, HX OF 12/07/2006  ?  Priority: Low  ? AAA (abdominal aortic aneurysm) without rupture 11/20/2019  ? PAD (peripheral artery disease) ([place]) 11/20/2019  ? Aortic atherosclerosis ([place]) 01/05/2019  ? Thrombocytopenia ([place])  11/10/2016  ? Encounter for therapeutic drug monitoring 08/27/2014  ? ? ?Medications- reviewed and updated ?Current Outpatient Medications  ?Medication Sig Dispense Refill  ? allopurinol (ZYLOPRIM) 300 MG tablet TAKE ONE-HALF TABLET BY MOUTH IN THE EVENING 45 tablet 1  ? amLODipine (NORVASC) 2.5 MG tablet TAKE 1 TABLET BY MOUTH EVERY DAY 90 tablet 1  ? colchicine 0.6 MG tablet Take 2 tablets (1.2 mg) at the first sign of flare, followed by 0.6 mg after 1 hour if pain not resolved. Then take once daily after that until pain resolves. Continue allopurinol during gout flares. 180 tablet 2  ? fluocinonide (LIDEX) 0.05 % external solution SMARTSIG:1 Milliliter(s) Topical Twice Daily    ? fluticasone (FLONASE) 50 MCG/ACT nasal spray Place 2 sprays into both nostrils daily. 48 g 3  ? furosemide (LASIX) 20 MG tablet Take 1 tablet (20 mg total) by mouth daily as needed. 90 tablet 1  ? metoprolol tartrate (LOPRESSOR) 25 MG tablet Take 0.5 tablets (12.5 mg total) by mouth daily. 90 tablet 1  ? Multiple Vitamin (MULTIVITAMIN) tablet Take 1 tablet by mouth daily.    ? tamsulosin (FLOMAX) 0.4 MG CAPS capsule TAKE 1 CAPSULE BY MOUTH EVERY DAY 90 capsule 1  ? triamterene-hydrochlorothiazide (MAXZIDE-25) 37.5-25 MG tablet TAKE 1/2 TABLET BY MOUTH EVERY DAY 45 tablet 1  ? warfarin (COUMADIN) 2.5 MG tablet TAKE 1 TABLET DAILY EXCEPT TAKE 1 AND 1/2 TABLETS ON WED AND SAT OR TAKE  AS DIRECTED BY CLINIC (Patient taking differently: Take 2.5 mg by mouth. TAKE 1 TABLET DAILY EXCEPT TAKE 1 AND 1/2 TABLETS ON SAT OR TAKE AS DIRECTED BY CLINIC) 120 tablet 1  ? zolpidem (AMBIEN) 5 MG tablet Take 0.5 tablets (2.5 mg total) by mouth at bedtime as needed for sleep. 15 tablet 5  ? ?No current facility-administered medications for this visit.  ? ?  ?Objective:  ?BP 124/72   Pulse (!) 52   Temp (!) 97.5 ?F (36.4 ?C)   Ht '5\' 11"'$  (1.803 m)   Wt 155 lb 12.8 oz (70.7 kg)   SpO2 95%   BMI 21.73 kg/m?  ?Gen: NAD, resting comfortably ?CV:  bradycardic  and irregularly irregular no murmurs rubs or gallops ?Lungs: CTAB no crackles, wheeze, rhonchi ?Ext: trace to 1+ edema ?Skin: warm, dry, red hands- reports numbness/tingling ? ?  ? ?Assessment and Plan  ? ?# Atrial fibrillation ?S: Rate controlled with metoprolol 25 mg daily- IR per Dr. Gwenlyn Found since he has done well with this- reports only taking half tablet daily instead of BID ?Anticoagulated with coumadin through [facility] - has visit tomorrow at [place] ?Patient is  followed by cardiology: Dr. Gwenlyn Found  ?A/P: appears rate controlled. Remains on coumadin for anticoagulation- continue current meds  ? ?#hypertension ?#CKD stage III ?S: medication: Maxide 37.5-25 mg half tablet, amlodipine 2.5 mg daily, metoprolol 12.5 mg daily instant release, Lasix 20 mg as needed ? ?GFR typically in the 50s range ?Home readings #s: usually 130s ?BP Readings from Last 3 Encounters:  ?06/24/21 124/72  ?05/12/21 110/70  ?05/10/21 128/85  ?A/P: Hypertension controlled- continue current meds ?For CKD stage III-hopefully stable-update CMP today  ? ?#Gout ?S: 0 flares in last several months on allopurinol 300 mg, colchicine on hand for flares ?Lab Results  ?Component Value Date  ? LABURIC 5.8 12/04/2020  ?A/P:Uric acid has been at goal- no recent flares-continue current medication ? ?#Insomnia-Ambien takes daily and helpful  Has failed trazodone in the past.  Denies falls or issues at night with stability-would need to discontinue this happened  ? ?#COPD-stable without medication-continue to monitor ? ?#BPH- tamsulosin helping him with urinary symptoms ? ?#Mild thrombocytopenia-trend with labs today ?Lab Results  ?Component Value Date  ? WBC 4.3 02/14/2021  ? HGB 14.5 02/14/2021  ? HCT 44.7 02/14/2021  ? MCV 92.9 02/14/2021  ? PLT 124 (L) 02/14/2021  ? ?#Infrarenal aortic aneurysm-last checked A March 17, 2021 with ultrasound and aneurysm was stable-plan for recheck in 1 year  ? ?#ongoing hand rash- also ? Neuropathy patient reports  per derm but would not usually cause skin changes - has seen 2 dermatologist- multiple creams not helpful- concern possibly neurological  (possible neuropathy- numbness/tingling/warmth) and referred to neurology by dermatology- has visit in April. Does not involve in feet. Seems to have some redness up onto arms- apepars to be some bruising to me in those areas but hands remain very red ?- was also told could be liver or heart related. Wonder if could be some contact issues.  ?-trying neurop away OTC_ has alpha lipoic acid in it ?- also check b12, tsh, a1c ?-tsh has been slightly off but generally wouldn't treat unless over 10 ? ?Recommended follow up: Return in about 3 months (around 09/24/2021) for follow up- or sooner if needed. ?Future Appointments  ?Date Time Provider [place]  ?06/25/2021  3:45 PM [facility]  ?08/15/2021  2:50 PM Jaffe, Adam R, DO [facility] None  ?12/26/2021  11:00 AM [facility] HEALTH COACH [facility]  ? ? ?Lab/Order associations: ?  ICD-10-CM   ?1. Essential hypertension  I10   ?  ?2. Chronic obstructive pulmonary disease, unspecified COPD type (HCC) Chronic J44.9   ?  ?3. Atrial fibrillation, unspecified type (HCC) Chronic I48.91   ?  ?4. Thrombocytopenia (HCC) Chronic D69.6   ?  ?5. Hyperlipidemia, unspecified hyperlipidemia type  E78.5 CBC with Differential/Platelet  ?  Comprehensive metabolic panel  ?  TSH  ?  ?6. Stage 3a chronic kidney disease (HCC)  N18.31   ?  ?7. Paresthesias  R20.2 TSH  ?  Vitamin B12  ?  Hemoglobin A1c  ?  ? ?No orders of the defined types were placed in this encounter. ? ?Return precautions advised.  ?[name], MD ? ?

## 2021-06-25 ENCOUNTER — Ambulatory Visit (INDEPENDENT_AMBULATORY_CARE_PROVIDER_SITE_OTHER): Payer: Medicare Other

## 2021-06-25 ENCOUNTER — Telehealth: Payer: Self-pay

## 2021-06-25 ENCOUNTER — Encounter: Payer: Self-pay | Admitting: Family Medicine

## 2021-06-25 DIAGNOSIS — Z7901 Long term (current) use of anticoagulants: Secondary | ICD-10-CM

## 2021-06-25 DIAGNOSIS — R739 Hyperglycemia, unspecified: Secondary | ICD-10-CM | POA: Insufficient documentation

## 2021-06-25 LAB — POCT INR: INR: 1.9 — AB (ref 2.0–3.0)

## 2021-06-25 NOTE — Patient Instructions (Addendum)
Pre visit review using our clinic review tool, if applicable. No additional management support is needed unless otherwise documented below in the visit note. ? ?Take 1 1/2 tablets today and then change weekly dose to take 1 tablet daily except take 1 1/2 on Mondays, Wednesdays and Fridays.  Re-check in 2 wks. ?

## 2021-06-25 NOTE — Telephone Encounter (Signed)
Pt was in today for coumadin clinic and mentioned he has not hear from PCP concerning his lab results. Printed PCP msg concerning results and went over these with pt. Pt verbalized understanding. ?Unable to document in lab note. Sending msg to PCP to make aware that pt has already received lab results.  ?

## 2021-06-25 NOTE — Progress Notes (Signed)
Take 1 1/2 tablets today and then change weekly dose to take 1 tablet daily except take 1 1/2 on Mondays, Wednesdays and Fridays.  Re-check in 2 wks. ?

## 2021-06-26 NOTE — Telephone Encounter (Signed)
I have documented in the result note.  ?

## 2021-06-26 NOTE — Telephone Encounter (Signed)
FYI team if you could document that results were discussed with patient in result notes ?

## 2021-07-08 ENCOUNTER — Other Ambulatory Visit: Payer: Self-pay

## 2021-07-08 ENCOUNTER — Telehealth: Payer: Self-pay | Admitting: Family Medicine

## 2021-07-08 ENCOUNTER — Ambulatory Visit (HOSPITAL_BASED_OUTPATIENT_CLINIC_OR_DEPARTMENT_OTHER)
Admission: RE | Admit: 2021-07-08 | Discharge: 2021-07-08 | Disposition: A | Discharge status: Home / Self Care | Payer: Medicare Other | Source: Ambulatory Visit | Attending: Family Medicine | Admitting: Family Medicine

## 2021-07-08 DIAGNOSIS — M25572 Pain in left ankle and joints of left foot: Secondary | ICD-10-CM

## 2021-07-08 NOTE — Telephone Encounter (Signed)
Pt's daughter is requesting an xray of pt's left lower leg before his appt with Dr Yong Channel on 07/10/21. Please advise ?

## 2021-07-08 NOTE — Telephone Encounter (Signed)
Ok to order xray prior to visit? ?

## 2021-07-08 NOTE — Telephone Encounter (Signed)
Called and spoke with pt daughter to get clarification on location of pt pain. Pt daughter states it is more of the ankle and not the leg, xray has been order for left ankle, they will go today to have it done.  ?

## 2021-07-08 NOTE — Telephone Encounter (Signed)
I am open to x-ray before visit- would need area of pain so we can have diagnosis code and order appropriate x-ray - if its lower leg above ankle should be tib/fib x-ray ?

## 2021-07-09 ENCOUNTER — Ambulatory Visit: Payer: Medicare Other

## 2021-07-09 NOTE — Progress Notes (Signed)
? ?Phone [phone] ?In person visit ?  ?Subjective:  ? ?William Mahoney is a 88 y.o. year old very pleasant male patient who presents for/with See problem oriented charting ?Chief Complaint  ?Patient presents with  ? Ankle Pain  ?  Pt had a fall over the weekend and has c/o ankle pain, xray done yesterday.  ? f/u on labs  ?  Pt is here to f/u on lab test that were done and also his hands and feet possible neuropathy.  ? ? ?This visit occurred during the SARS-CoV-2 public health emergency.  Safety protocols were in place, including screening questions prior to the visit, additional usage of staff PPE, and extensive cleaning of exam room while observing appropriate contact time as indicated for disinfecting solutions.  ? ?Past Medical History-  ?Patient Active Problem List  ? Diagnosis Date Noted  ? Edema 11/10/2016  ?  Priority: High  ? Atrial fibrillation ([place]) 12/07/2008  ?  Priority: High  ? Hyperglycemia 06/25/2021  ?  Priority: Medium   ? AAA (abdominal aortic aneurysm) without rupture 11/20/2019  ?  Priority: Medium   ? PAD (peripheral artery disease) ([place]) 11/20/2019  ?  Priority: Medium   ? Chronic kidney disease (CKD), stage III (moderate) ([place]) 02/15/2019  ?  Priority: Medium   ? Aortic atherosclerosis ([place]) 01/05/2019  ?  Priority: Medium   ? Thrombocytopenia ([place]) 11/10/2016  ?  Priority: Medium   ? Insomnia 10/19/2014  ?  Priority: Medium   ? Gout 01/09/2014  ?  Priority: Medium   ? History of cancer of rectosigmoid junction 01/04/2008  ?  Priority: Medium   ? Former smoker 06/23/2007  ?  Priority: Medium   ? Hyperlipidemia, unspecified 02/09/2007  ?  Priority: Medium   ? Essential hypertension 12/07/2006  ?  Priority: Medium   ? COPD (chronic obstructive pulmonary disease) ([place]) 12/07/2006  ?  Priority: Medium   ? BPH (benign prostatic hyperplasia) 12/07/2006  ?  Priority: Medium   ? Encounter for therapeutic drug monitoring 08/27/2014  ?  Priority: Low  ? Allergic rhinitis 08/15/2013  ?  Priority:  Low  ? Arthritis of lumbar spine 01/20/2013  ?  Priority: Low  ? Inguinal hernia 03/21/2010  ?  Priority: Low  ? BASAL CELL CARCINOMA SKIN LOWER LIMB INCL HIP 03/21/2010  ?  Priority: Low  ? Actinic keratosis 12/07/2008  ?  Priority: Low  ? TMJ (temporomandibular joint disorder) 02/09/2007  ?  Priority: Low  ? URINARY INCONTINENCE 12/07/2006  ?  Priority: Low  ? PANCREATITIS, HX OF 12/07/2006  ?  Priority: Low  ? ? ?Medications- reviewed and updated ?Current Outpatient Medications  ?Medication Sig Dispense Refill  ? allopurinol (ZYLOPRIM) 300 MG tablet TAKE ONE-HALF TABLET BY MOUTH IN THE EVENING 45 tablet 1  ? amLODipine (NORVASC) 2.5 MG tablet TAKE 1 TABLET BY MOUTH EVERY DAY 90 tablet 1  ? colchicine 0.6 MG tablet Take 2 tablets (1.2 mg) at the first sign of flare, followed by 0.6 mg after 1 hour if pain not resolved. Then take once daily after that until pain resolves. Continue allopurinol during gout flares. 180 tablet 2  ? fluocinonide (LIDEX) 0.05 % external solution SMARTSIG:1 Milliliter(s) Topical Twice Daily    ? fluticasone (FLONASE) 50 MCG/ACT nasal spray Place 2 sprays into both nostrils daily. 48 g 3  ? furosemide (LASIX) 20 MG tablet Take 1 tablet (20 mg total) by mouth daily as needed. 90 tablet 1  ? metoprolol tartrate (LOPRESSOR)  25 MG tablet Take 0.5 tablets (12.5 mg total) by mouth daily. 90 tablet 1  ? Multiple Vitamin (MULTIVITAMIN) tablet Take 1 tablet by mouth daily.    ? tamsulosin (FLOMAX) 0.4 MG CAPS capsule TAKE 1 CAPSULE BY MOUTH EVERY DAY 90 capsule 1  ? triamterene-hydrochlorothiazide (MAXZIDE-25) 37.5-25 MG tablet TAKE 1/2 TABLET BY MOUTH EVERY DAY 45 tablet 1  ? warfarin (COUMADIN) 2.5 MG tablet TAKE 1 TABLET DAILY EXCEPT TAKE 1 AND 1/2 TABLETS ON WED AND SAT OR TAKE AS DIRECTED BY CLINIC (Patient taking differently: Take 2.5 mg by mouth. TAKE 1 TABLET DAILY EXCEPT TAKE 1 AND 1/2 TABLETS ON SAT OR TAKE AS DIRECTED BY CLINIC) 120 tablet 1  ? zolpidem (AMBIEN) 5 MG tablet Take 0.5  tablets (2.5 mg total) by mouth at bedtime as needed for sleep. 15 tablet 5  ? ?No current facility-administered medications for this visit.  ? ?  ?Objective:  ?BP 138/68   Pulse 65   Temp (!) 97.3 ?F (36.3 ?C)   Ht '5\' 11"'$  (1.803 m)   Wt 155 lb (70.3 kg)   SpO2 100%   BMI 21.62 kg/m?  ?Gen: NAD, resting comfortably ?CV: RRR no murmurs rubs or gallops ?Lungs: CTAB no crackles, wheeze, rhonchi ?Ext: no edema ?Skin: warm, dry-erythematous fingers noted for more recent baseline ?Edema at least 1+ on the left leg and very tender to palpation along medial malleolus both anteriorly and posteriorly-no tenderness on lateral malleolus ?  ? ?Assessment and Plan  ? ? ?#Left ankle pain ?S: Patient ran into something concrete-sounds like fireplace-with the inside of his left ankle I believe last Friday and was dealing with a lot of swelling and pain since that time-then on Sunday he fell down 2 or 3 stairs (no report of hitting his head) and ankle pain seem to worsen.  He has a high pain tolerance but at least moderate level pain perceived-walking with a cane which she normally would not do. ? ?Had x-ray done on the 21st but it has not yet been read. ?A/P: Patient with significant swelling and pain around the medial ankle/tibia-I am concerned for possible fracture though x-ray has not been read yet.  We are going to place him in a cam walker boot.  Recommended referral to sports medicine but he would like to hold off at this time and wait on x-ray report ?-Handwritten prescription was given to take to medical supply store ? ?#Hand rash versus neuropathy? ?S: Patient is seen to dermatologist-has tried multiple creams on the hands.  Was told by neurology could potentially be neuropathy related as he also has some numbness/tingling/warmth and was even referred to neurology by dermatology and has a visit in April. ? ?In the past has reported does not involve his feet but more recently has noted some numbness and tingling in the  feet.  TSH (has been mildly off in the past but in normal range on last check and always under 10) and B12 largely reassuring at last visit. ? ?Was also told could be liver or heart related.  Does not appear any biopsies have been performed. ? ?-pain 3-4 in hands. Can sleep with it.  ?Neurop away gel helps- takes down to 1-2/10 ? ?A/P: Unclear etiology of ongoing redness on the hands with some possible neuropathy symptoms as well-I think is reasonable to restart his Neuroaway gel since he gets rather significant relief.  We briefly considered gabapentin but ultimately opted out-some concern with combining gabapentin and Ambien ?  ?#  hypertension ?#CKD stage III ?S: medication: Maxide 37.5-25 mg half tablet, amlodipine 2.5 mg daily, metoprolol 12.5 mg daily instant release, Lasix 20 mg as needed-has used some with leg swelling after injury ?  ?GFR typically in the 50s  range ?BP Readings from Last 3 Encounters:  ?07/10/21 138/68  ?06/24/21 124/72  ?05/12/21 110/70  ?A/P: Hypertension reasonably controlled for his age-continue current medication.  CKD stage III has been stable ? ?Recommended follow up: No follow-ups on file. ?Future Appointments  ?Date Time Provider [place]  ?07/22/2021  3:45 PM Gardiner Barefoot, DPM [facility]  ?08/15/2021  2:50 PM [name] R, DO [facility] None  ?09/25/2021  3:40 PM Marin Olp, MD [facility]  ?12/26/2021 11:00 AM [facility] HEALTH COACH [facility]  ? ? ?Lab/Order associations: ?  ICD-10-CM   ?1. Atrial fibrillation, unspecified type ([place])  I48.91   ?  ?2. Essential hypertension  I10   ?  ?3. Stage 3a chronic kidney disease (HCC)  N18.31   ?  ?4. Chronic gout without tophus, unspecified cause, unspecified site  [id number]   ?  ?5. Chronic obstructive pulmonary disease, unspecified COPD type ([place])  J44.9   ?  ?6. Benign prostatic hyperplasia with lower urinary tract symptoms, symptom details unspecified  N40.1   ?  ?7. Thrombocytopenia ([place])  D69.6   ?  ? ?No orders  of the defined types were placed in this encounter. ? ?I,Jada Bradford,acting as a scribe for [name], MD.,have documented all relevant documentation on the behalf of [name], MD,as directed by  Chauncey Cruel

## 2021-07-10 ENCOUNTER — Ambulatory Visit: Payer: Medicare Other | Admitting: Family Medicine

## 2021-07-10 ENCOUNTER — Encounter: Payer: Self-pay | Admitting: Family Medicine

## 2021-07-10 VITALS — BP 138/68 | HR 65 | Temp 97.3°F | Ht 71.0 in | Wt 155.0 lb

## 2021-07-10 DIAGNOSIS — M25572 Pain in left ankle and joints of left foot: Secondary | ICD-10-CM | POA: Diagnosis not present

## 2021-07-10 DIAGNOSIS — M1A9XX0 Chronic gout, unspecified, without tophus (tophi): Secondary | ICD-10-CM

## 2021-07-10 DIAGNOSIS — I4891 Unspecified atrial fibrillation: Secondary | ICD-10-CM

## 2021-07-10 DIAGNOSIS — N1831 Chronic kidney disease, stage 3a: Secondary | ICD-10-CM | POA: Diagnosis not present

## 2021-07-10 DIAGNOSIS — J449 Chronic obstructive pulmonary disease, unspecified: Secondary | ICD-10-CM

## 2021-07-10 DIAGNOSIS — I10 Essential (primary) hypertension: Secondary | ICD-10-CM | POA: Diagnosis not present

## 2021-07-10 DIAGNOSIS — N401 Enlarged prostate with lower urinary tract symptoms: Secondary | ICD-10-CM

## 2021-07-10 DIAGNOSIS — D696 Thrombocytopenia, unspecified: Secondary | ICD-10-CM

## 2021-07-10 NOTE — Patient Instructions (Addendum)
Team either give him a cam walker for the left foot (walking boot) or write a prescription for cam walker under left ankle pain ? ?We are waiting on formal x-ray reading to determine next step- offered sports medicine visit but you preferred to wait for now.  ? ?Try to stay off the foot if possible and elevate it ? ?Recommended follow up: Return for next already scheduled visit or sooner if needed. ?

## 2021-07-22 ENCOUNTER — Ambulatory Visit (INDEPENDENT_AMBULATORY_CARE_PROVIDER_SITE_OTHER): Payer: Medicare Other | Admitting: Podiatry

## 2021-07-22 ENCOUNTER — Encounter: Payer: Self-pay | Admitting: Podiatry

## 2021-07-22 DIAGNOSIS — I739 Peripheral vascular disease, unspecified: Secondary | ICD-10-CM | POA: Diagnosis not present

## 2021-07-22 DIAGNOSIS — N1831 Chronic kidney disease, stage 3a: Secondary | ICD-10-CM

## 2021-07-22 DIAGNOSIS — M79675 Pain in left toe(s): Secondary | ICD-10-CM | POA: Diagnosis not present

## 2021-07-22 DIAGNOSIS — M79674 Pain in right toe(s): Secondary | ICD-10-CM

## 2021-07-22 DIAGNOSIS — D696 Thrombocytopenia, unspecified: Secondary | ICD-10-CM

## 2021-07-22 DIAGNOSIS — B351 Tinea unguium: Secondary | ICD-10-CM | POA: Diagnosis not present

## 2021-07-22 NOTE — Progress Notes (Signed)
This patient returns to my office for at risk foot care.  This patient requires this care by a professional since this patient will be at risk due to having  PAD, CKD coagulation defect and thrombocytopenia.  Patient is taking coumadin.  This patient is unable to cut nails himself since the patient cannot reach his nails.These nails are painful walking and wearing shoes.  This patient presents for at risk foot care today. ? ?General Appearance  Alert, conversant and in no acute stress. ? ?Vascular  Dorsalis pedis and posterior tibial  pulses are  weakly palpable  bilaterally.  Capillary return is within normal limits  bilaterally. Temperature is within normal limits  bilaterally. Venous disease feet dorsally  B/l. ? ?Neurologic  Senn-Weinstein monofilament wire test within normal limits  bilaterally. Muscle power within normal limits bilaterally. ? ?Nails Thick disfigured discolored nails with subungual debris  from hallux to fifth toes bilaterally. No evidence of bacterial infection or drainage bilaterally. ? ?Orthopedic  No limitations of motion  feet .  No crepitus or effusions noted.  No bony pathology or digital deformities noted. ? ?Skin  normotropic skin with no porokeratosis noted bilaterally.  No signs of infections or ulcers noted.    ? ?Onychomycosis  Pain in right toes  Pain in left toes ? ?Consent was obtained for treatment procedures.   Mechanical debridement of nails 1-5  bilaterally performed with a nail nipper.  Filed with dremel without incident.  ? ? ?Return office visit    4 months                  Told patient to return for periodic foot care and evaluation due to potential at risk complications. ? ? ?Gardiner Barefoot DPM   ?

## 2021-07-23 ENCOUNTER — Other Ambulatory Visit: Payer: Self-pay | Admitting: Family Medicine

## 2021-07-23 DIAGNOSIS — Z5181 Encounter for therapeutic drug level monitoring: Secondary | ICD-10-CM

## 2021-07-24 ENCOUNTER — Other Ambulatory Visit: Payer: Self-pay | Admitting: Family Medicine

## 2021-08-06 ENCOUNTER — Ambulatory Visit (INDEPENDENT_AMBULATORY_CARE_PROVIDER_SITE_OTHER): Payer: Medicare Other

## 2021-08-06 DIAGNOSIS — Z7901 Long term (current) use of anticoagulants: Secondary | ICD-10-CM

## 2021-08-06 LAB — POCT INR: INR: 2.2 (ref 2.0–3.0)

## 2021-08-06 NOTE — Progress Notes (Signed)
Continue 1 tablet daily except take 1 1/2 on Mondays, Wednesdays and Fridays.  Re-check in 6 wks. 

## 2021-08-06 NOTE — Patient Instructions (Addendum)
Pre visit review using our clinic review tool, if applicable. No additional management support is needed unless otherwise documented below in the visit note.  Continue 1 tablet daily except take 1 1/2 on Mondays, Wednesdays and Fridays.  Re-check in 6 wks. 

## 2021-08-13 NOTE — Progress Notes (Signed)
? ?NEUROLOGY CONSULTATION NOTE ? ?[name] ?MRN: [id number] ?DOB: 07-21-1925 ? ?Referring provider: [name], MD ?Primary care provider: [name], MD ? ?Reason for consult:  neuropathy ? ?Assessment/Plan:  ? ?Peripheral neuropathy - however, given the subacute progression of symptoms in the upper extremities, I want to rule out any evidence of a cervical spinal stenosis as well. ? ?Check MRI cervical spine to rule out spinal stenosis ?Check further neuropathy labs:  SPEP/IFE, SSA/SSB antibodies, ACE, B6. ?Start gabapentin titrating to '200mg'$  at bedtime ?Follow up 4-5 months. ? ? ?Subjective:  ?William Mahoney is a 88 year old male with COPD, a fib, PAD, CKD St 3a, thrombocytopenia, and history of rectal cancer, melanoma, gout and remote history of alcoholism who presents for neuropathy.  History supplemented by primary care notes. ? ? ? In January, he started experiencing burning and tingling in the web between his 2nd and 3rd fingers of his left han.  It then involved the same on the right hand..  It subsequently progressed to involve the entire hand and now up the mid-forearm bilaterally.  He has some discoloration of his hands but has become more red than usual.  No weakness.  No neck pain.  No involvement of the lower extremities.  Has seen dermatology.  Topical steroid creams ineffective.  Labs from March include B12 964, TSH 4.27, Hgb A1c 6.3, CBC with PLT 109 but otherwise unremarkable and CMP with GFR 52 but otherwise unremarkable. ? ? ?PAST MEDICAL HISTORY: ?Past Medical History:  ?Diagnosis Date  ? BPH (benign prostatic hypertrophy)   ? Chronic atrial fibrillation (HCC)   ? cardiologist--   dr berry  ? COPD (chronic obstructive pulmonary disease) ([place])   ? Diverticulosis of colon   ? MODERATE LEFT SIDE  ? Epididymal cyst   ? w/ epididymalitis  ? Full dentures   ? Gout   ? Hand foot syndrome   ? secondary to chemotherapy (Xeloda)   cold hands/feet  ? Hearing loss   ? History of alcohol abuse    ? quit drinking in the mid 80's  ? History of cirrhosis of liver   ? alcoholic--  hx alcohol abuse -- quit drinking 1980's  ? History of gout   ? History of melanoma excision   ? 2013--  left ear lobe and back of hand  ? History of pancreatitis   ? 2008  ? History of rectal cancer oncologist-  dr Benay Spice--  no recurrence  ? dx Sept 2009--  Stage II (T3N0)  s/p  sigmoid colectomy & low anterior resection 04-18-2008  and chemoradiation 2010  ? History of shingles 07/27/2010  ? History of squamous cell carcinoma excision   ? left lower leg  ? Hyperlipidemia   ? Hypertension   ? Loose stools   ? DUE TO ANTIBIOTICS  ? Macular degeneration   ? not sure which eye  ? OA (osteoarthritis)   ? RBBB (right bundle branch block)   ? Renal lesion   ? chronic-- left side  ? Sciatica of right side   ? Urge urinary incontinence   ? intermittant  ? ? ?PAST SURGICAL HISTORY: ?Past Surgical History:  ?Procedure Laterality Date  ? CARDIAC CATHETERIZATION  2001  approx.  in [place]  ? CATARACT EXTRACTION W/ INTRAOCULAR LENS  IMPLANT, BILATERAL    ? COLONOSCOPY  last one 06-27-2012  ? EXPLORATORY LAPAROTOMY/  LOW ANTERIOR RESECTION/  SIGMOID COLECTOMY  04-18-2008   DR Dalbert Batman  ? INCISION AND DRAINAGE ABSCESS N/A  11/02/2014  ? Procedure: INCISION AND DRAINAGE OF SCROTUM;  Surgeon: Ardis Hughs, MD;  Location: [facility];  Service: Urology;  Laterality: N/A;  ? INGUINAL HERNIA REPAIR Right 03/23/2014  ? Procedure: HERNIA REPAIR INGUINAL ADULT OPEN REPAIR RIGHT INGUINAL HERNIA REPAIR;  Surgeon: Fanny Skates, MD;  Location: [facility];  Service: General;  Laterality: Right;  ? INSERTION OF MESH Right 03/23/2014  ? Procedure: INSERTION OF MESH;  Surgeon: Fanny Skates, MD;  Location: [facility];  Service: General;  Laterality: Right;  ? LAPAROSCOPIC CHOLECYSTECTOMY  1990's  ? MASS EXCISION N/A 11/02/2014  ? Procedure: EXCISION OF EPIDIDYMAL CYST;  Surgeon: Ardis Hughs, MD;  Location: [facility];  Service:  Urology;  Laterality: N/A;  ? TONSILLECTOMY  as child  ? TRANSTHORACIC ECHOCARDIOGRAM  05-11-2008  ? pseudonormal LV filling pattern,  ef 60-65%/  mild to moderate MV calcification no stenosis w/ mild to moderate regurg./  mild LAE and RAE/  mild TR  ? ? ?MEDICATIONS: ?Current Outpatient Medications on File Prior to Visit  ?Medication Sig Dispense Refill  ? zolpidem (AMBIEN) 5 MG tablet TAKE 0.5 TABLETS (2.5 MG TOTAL) BY MOUTH AT BEDTIME AS NEEDED FOR SLEEP. 15 tablet 5  ? allopurinol (ZYLOPRIM) 300 MG tablet TAKE ONE-HALF TABLET BY MOUTH IN THE EVENING 45 tablet 1  ? amLODipine (NORVASC) 2.5 MG tablet TAKE 1 TABLET BY MOUTH EVERY DAY 90 tablet 1  ? colchicine 0.6 MG tablet Take 2 tablets (1.2 mg) at the first sign of flare, followed by 0.6 mg after 1 hour if pain not resolved. Then take once daily after that until pain resolves. Continue allopurinol during gout flares. 180 tablet 2  ? fluocinonide (LIDEX) 0.05 % external solution SMARTSIG:1 Milliliter(s) Topical Twice Daily    ? fluticasone (FLONASE) 50 MCG/ACT nasal spray Place 2 sprays into both nostrils daily. 48 g 3  ? furosemide (LASIX) 20 MG tablet Take 1 tablet (20 mg total) by mouth daily as needed. 90 tablet 1  ? metoprolol tartrate (LOPRESSOR) 25 MG tablet Take 0.5 tablets (12.5 mg total) by mouth daily. 90 tablet 1  ? Multiple Vitamin (MULTIVITAMIN) tablet Take 1 tablet by mouth daily.    ? tamsulosin (FLOMAX) 0.4 MG CAPS capsule TAKE 1 CAPSULE BY MOUTH EVERY DAY 90 capsule 1  ? triamterene-hydrochlorothiazide (MAXZIDE-25) 37.5-25 MG tablet TAKE 1/2 TABLET BY MOUTH EVERY DAY 45 tablet 1  ? warfarin (COUMADIN) 2.5 MG tablet TAKE 1 TABLET DAILY EXCEPT TAKE 1 AND 1/2 TABLETS ON WED AND SAT OR TAKE AS DIRECTED BY CLINIC 120 tablet 1  ? ?No current facility-administered medications on file prior to visit.  ? ? ?ALLERGIES: ?No Known Allergies ? ?FAMILY HISTORY: ?Family History  ?Problem Relation Age of Onset  ? Hypertension Mother   ? Lymphoma Father   ?  Lymphoma Other   ? Stroke Other   ?     1st degree relative  ? Colon cancer Neg Hx   ? Esophageal cancer Neg Hx   ? Rectal cancer Neg Hx   ? Stomach cancer Neg Hx   ? ? ?Objective:  ?Blood pressure 135/69, pulse (!) 51, height '5\' 10"'$  (1.778 m), weight 158 lb (71.7 kg), SpO2 98 %. ?General: No acute distress.  Patient appears well-groomed.   ?Head:  Normocephalic/atraumatic ?Eyes:  fundi examined but not visualized ?Neck: supple, no paraspinal tenderness, full range of motion ?Heart: regular rate and rhythm ?Neurological Exam: ?Mental status: alert and oriented to person, place, and  time, recent and remote memory intact, fund of knowledge intact, attention and concentration intact, speech fluent and not dysarthric, language intact. ?Cranial nerves: ?CN I: not tested ?CN II: pupils equal, round and reactive to light, visual fields intact ?CN III, IV, VI:  full range of motion, no nystagmus, no ptosis ?CN V: facial sensation intact. ?CN VII: upper and lower face symmetric ?CN VIII: hearing intact ?CN IX, X: gag intact, uvula midline ?CN XI: sternocleidomastoid and trapezius muscles intact ?CN XII: tongue midline ?Bulk & Tone: normal, no fasciculations. ?Motor:  muscle strength 5/5 throughout ?Sensation:  Pinprick sensation reduced in forearms and feet up to below knees, vibratory sensation reduced in feet. ?Deep Tendon Reflexes:  2+ throughout,  toes downgoing.   ?Finger to nose testing:  Without dysmetria.   ?Heel to shin:  Without dysmetria.   ?Gait:  Steady.  Romberg negative ? ? ? ?Thank you for allowing me to take part in the care of this patient. ? ?[name], DO ? ?CC: [name], MD ? ? ? ? ?

## 2021-08-15 ENCOUNTER — Ambulatory Visit (INDEPENDENT_AMBULATORY_CARE_PROVIDER_SITE_OTHER): Payer: Medicare Other | Admitting: Neurology

## 2021-08-15 ENCOUNTER — Encounter: Payer: Self-pay | Admitting: Neurology

## 2021-08-15 VITALS — BP 135/69 | HR 51 | Ht 70.0 in | Wt 158.0 lb

## 2021-08-15 DIAGNOSIS — R202 Paresthesia of skin: Secondary | ICD-10-CM | POA: Diagnosis not present

## 2021-08-15 DIAGNOSIS — R200 Anesthesia of skin: Secondary | ICD-10-CM | POA: Diagnosis not present

## 2021-08-15 MED ORDER — GABAPENTIN 100 MG PO CAPS: ORAL_CAPSULE | ORAL | 0 refills | Status: DC

## 2021-08-15 NOTE — Patient Instructions (Signed)
Start gabapentin '100mg'$  - take  1 pill at bedtime for a week, then 2 pills at bedtime.  If no improvement in 4 weeks, contact me ?Check MRI of cervical spine without contrast ?Check SSA/SSB antibodies, B6, ACE, SPEP/IFE ?Follow up 4-5 months. ?

## 2021-08-19 ENCOUNTER — Telehealth: Payer: Self-pay | Admitting: Neurology

## 2021-08-19 NOTE — Telephone Encounter (Signed)
Patient would like a call back to discuss his after visit summary. He is confused about some things on there ?

## 2021-08-19 NOTE — Telephone Encounter (Signed)
Called patient and he informed me that he has not heard from Dr. Ansel Bong office in regards to getting the lab work Dr,. Tomi Likens has ordered done. I informed William Mahoney that I would fax his lab orders over and request they contact him for an appt,. Patient is aware that if he does not hear back from the office he can call Dr. Ronney Lion office to follow up ion his lab orders. Patient had no additional questions.  ? ?Lab orders has been faxed successfully.  ?

## 2021-08-21 ENCOUNTER — Other Ambulatory Visit: Payer: Self-pay

## 2021-08-21 ENCOUNTER — Other Ambulatory Visit: Payer: Medicare Other

## 2021-08-21 ENCOUNTER — Other Ambulatory Visit (INDEPENDENT_AMBULATORY_CARE_PROVIDER_SITE_OTHER): Payer: Medicare Other

## 2021-08-21 DIAGNOSIS — R200 Anesthesia of skin: Secondary | ICD-10-CM

## 2021-08-21 DIAGNOSIS — R202 Paresthesia of skin: Secondary | ICD-10-CM | POA: Diagnosis not present

## 2021-08-24 ENCOUNTER — Other Ambulatory Visit: Payer: Self-pay | Admitting: Family Medicine

## 2021-08-25 LAB — IMMUNOFIXATION, SERUM
IgA/Immunoglobulin A, Serum: 206 mg/dL (ref 61–437)
IgG (Immunoglobin G), Serum: 726 mg/dL (ref 603–1613)
IgM (Immunoglobulin M), Srm: 32 mg/dL (ref 15–143)

## 2021-08-26 ENCOUNTER — Other Ambulatory Visit: Payer: Self-pay | Admitting: Neurology

## 2021-08-27 LAB — PROTEIN ELECTROPHORESIS, SERUM
Albumin ELP: 3.9 g/dL (ref 3.8–4.8)
Alpha 1: 0.3 g/dL (ref 0.2–0.3)
Alpha 2: 0.8 g/dL (ref 0.5–0.9)
Beta 2: 0.3 g/dL (ref 0.2–0.5)
Beta Globulin: 0.4 g/dL (ref 0.4–0.6)
Gamma Globulin: 0.7 g/dL — ABNORMAL LOW (ref 0.8–1.7)
Total Protein: 6.4 g/dL (ref 6.1–8.1)

## 2021-08-27 LAB — VITAMIN B6: Vitamin B6: 7.5 ng/mL (ref 2.1–21.7)

## 2021-08-27 LAB — ANGIOTENSIN CONVERTING ENZYME: Angiotensin-Converting Enzyme: 44 U/L (ref 9–67)

## 2021-08-27 LAB — SJOGRENS SYNDROME-B EXTRACTABLE NUCLEAR ANTIBODY: SSB (La) (ENA) Antibody, IgG: <1 AI

## 2021-08-27 LAB — SJOGRENS SYNDROME-A EXTRACTABLE NUCLEAR ANTIBODY: SSA (Ro) (ENA) Antibody, IgG: <1 AI

## 2021-08-27 NOTE — Progress Notes (Signed)
Patient advised of his lab results. ?Patient states the Gabapentin not helping to much, and his hands still tingling.

## 2021-08-28 ENCOUNTER — Telehealth: Payer: Self-pay | Admitting: Family Medicine

## 2021-08-28 NOTE — Telephone Encounter (Signed)
Pt's daughter stated the Pharmacy will not refill the RX, but does not know why. ? ?[place] office called [facility], pharmacy states it sent a PA on 05/09 to fill Rx. ?PA is not in the fax queue as of 05/11, and may be in provider's box ? ?Lvm with daughter explaining the 3-5 business day policy to process these requests. ?

## 2021-08-29 NOTE — Telephone Encounter (Signed)
Apologies for the omission ? ?zolpidem (AMBIEN) 5 MG tablet [[id number]]  ?

## 2021-08-29 NOTE — Telephone Encounter (Signed)
Which medication are you referring to?

## 2021-09-11 ENCOUNTER — Other Ambulatory Visit: Payer: Self-pay | Admitting: Neurology

## 2021-09-12 ENCOUNTER — Other Ambulatory Visit: Payer: Self-pay | Admitting: Family Medicine

## 2021-09-12 MED ORDER — GABAPENTIN 100 MG PO CAPS: ORAL_CAPSULE | ORAL | 4 refills | Status: DC

## 2021-09-17 ENCOUNTER — Ambulatory Visit: Payer: Medicare Other

## 2021-09-25 ENCOUNTER — Ambulatory Visit: Payer: Medicare Other | Admitting: Family Medicine

## 2021-09-25 ENCOUNTER — Encounter: Payer: Self-pay | Admitting: Family Medicine

## 2021-09-25 ENCOUNTER — Ambulatory Visit (INDEPENDENT_AMBULATORY_CARE_PROVIDER_SITE_OTHER): Payer: Medicare Other | Admitting: Family Medicine

## 2021-09-25 VITALS — BP 133/67 | HR 84 | Temp 97.8°F | Ht 70.0 in | Wt 153.0 lb

## 2021-09-25 DIAGNOSIS — M542 Cervicalgia: Secondary | ICD-10-CM

## 2021-09-25 MED ORDER — PREDNISONE 20 MG PO TABS: 40.0000 mg | ORAL_TABLET | Freq: Every day | ORAL | 0 refills | Status: DC

## 2021-09-25 NOTE — Patient Instructions (Signed)
For neck-heat, tylenol, muscle rub Pick up prednisone.   Worse, not changing, let us know.  Get MRI scheduled

## 2021-09-25 NOTE — Progress Notes (Signed)
Subjective:     Patient ID: William Mahoney, male    DOB: [birth year], 88 y.o.   MRN: [id number]  Chief Complaint  Patient presents with   Neck Pain    No history of injury     HPI Past 4-5 days, pain in neck-still on L and goes across back of neck.  No falls.  Bends over a lot.  Woke up w/it. No weakness. Heat helps some, ice not help. Not sure if tylenol/muscle rub helps  Since before Jan-tingling hands. No pain.    There are no preventive care reminders to display for this patient.  Past Medical History:  Diagnosis Date   BPH (benign prostatic hypertrophy)    Chronic atrial fibrillation [facility])    cardiologist--   dr berry   COPD (chronic obstructive pulmonary disease) ([place])    Diverticulosis of colon    MODERATE LEFT SIDE   Epididymal cyst    w/ epididymalitis   Full dentures    Gout    Hand foot syndrome    secondary to chemotherapy (Xeloda)   cold hands/feet   Hearing loss    History of alcohol abuse    quit drinking in the mid 80's   History of cirrhosis of liver    alcoholic--  hx alcohol abuse -- quit drinking 1980's   History of gout    History of melanoma excision    2013--  left ear lobe and back of hand   History of pancreatitis    2008   History of rectal cancer oncologist-  dr Benay Spice--  no recurrence   dx Sept 2009--  Stage II (T3N0)  s/p  sigmoid colectomy & low anterior resection 04-18-2008  and chemoradiation 2010   History of shingles 07/27/2010   History of squamous cell carcinoma excision    left lower leg   Hyperlipidemia    Hypertension    Loose stools    DUE TO ANTIBIOTICS   Macular degeneration    not sure which eye   OA (osteoarthritis)    RBBB (right bundle branch block)    Renal lesion    chronic-- left side   Sciatica of right side    Urge urinary incontinence    intermittant    Past Surgical History:  Procedure Laterality Date   CARDIAC CATHETERIZATION  2001  approx.  in [place]   IMPLANT, BILATERAL     COLONOSCOPY  last one 06-27-2012   EXPLORATORY LAPAROTOMY/  LOW ANTERIOR RESECTION/  SIGMOID COLECTOMY  04-18-2008   DR INGRAM   INCISION AND DRAINAGE ABSCESS N/A 11/02/2014   Procedure: INCISION AND DRAINAGE OF SCROTUM;  Surgeon: Ardis Hughs, MD;  Location: [facility];  Service: Urology;  Laterality: N/A;   INGUINAL HERNIA REPAIR Right 03/23/2014   Procedure: HERNIA REPAIR INGUINAL ADULT OPEN REPAIR RIGHT INGUINAL HERNIA REPAIR;  Surgeon: Fanny Skates, MD;  Location: [place];  Service: General;  Laterality: Right;   INSERTION OF MESH Right 03/23/2014   Procedure: INSERTION OF MESH;  Surgeon: Fanny Skates, MD;  Location: [place];  Service: General;  Laterality: Right;   LAPAROSCOPIC CHOLECYSTECTOMY  1990's   MASS EXCISION N/A 11/02/2014   Procedure: EXCISION OF EPIDIDYMAL CYST;  Surgeon: Ardis Hughs, MD;  Location: [facility];  Service: Urology;  Laterality: N/A;   TONSILLECTOMY  as child   TRANSTHORACIC ECHOCARDIOGRAM  05-11-2008   pseudonormal LV filling pattern,  ef 60-65%/  mild to  moderate MV calcification no stenosis w/ mild to moderate regurg./  mild LAE and RAE/  mild TR    Outpatient Medications Prior to Visit  Medication Sig Dispense Refill   allopurinol (ZYLOPRIM) 300 MG tablet TAKE ONE-HALF TABLET BY MOUTH IN THE EVENING 45 tablet 1   amLODipine (NORVASC) 2.5 MG tablet TAKE 1 TABLET BY MOUTH EVERY DAY 90 tablet 1   colchicine 0.6 MG tablet Take 2 tablets (1.2 mg) at the first sign of flare, followed by 0.6 mg after 1 hour if pain not resolved. Then take once daily after that until pain resolves. Continue allopurinol during gout flares. 180 tablet 2   fluocinonide (LIDEX) 0.05 % external solution SMARTSIG:1 Milliliter(s) Topical Twice Daily     fluticasone (FLONASE) 50 MCG/ACT nasal spray Place 2 sprays into both nostrils daily. 48 g 3   furosemide (LASIX) 20 MG tablet Take 1 tablet (20 mg total) by mouth daily as  needed. 90 tablet 1   gabapentin (NEURONTIN) 100 MG capsule 2 capsules at bedtime 60 capsule 4   metoprolol tartrate (LOPRESSOR) 25 MG tablet Take 0.5 tablets (12.5 mg total) by mouth daily. 90 tablet 1   Multiple Vitamin (MULTIVITAMIN) tablet Take 1 tablet by mouth daily.     tamsulosin (FLOMAX) 0.4 MG CAPS capsule TAKE 1 CAPSULE BY MOUTH EVERY DAY 90 capsule 1   triamterene-hydrochlorothiazide (MAXZIDE-25) 37.5-25 MG tablet TAKE 1/2 TABLET BY MOUTH EVERY DAY 45 tablet 1   warfarin (COUMADIN) 2.5 MG tablet TAKE 1 TABLET DAILY EXCEPT TAKE 1 AND 1/2 TABLETS ON WED AND SAT OR TAKE AS DIRECTED BY CLINIC 120 tablet 1   zolpidem (AMBIEN) 5 MG tablet TAKE 0.5 TABLETS (2.5 MG TOTAL) BY MOUTH AT BEDTIME AS NEEDED FOR SLEEP. 15 tablet 5   No facility-administered medications prior to visit.    No Known Allergies ROS neg/noncontributory except as noted HPI/below      Objective:     BP 133/67   Pulse 84   Temp 97.8 F (36.6 C) (Temporal)   Ht '5\' 10"'$  (1.778 m)   Wt 153 lb (69.4 kg)   SpO2 99%   BMI 21.95 kg/m  Wt Readings from Last 3 Encounters:  09/25/21 153 lb (69.4 kg)  08/15/21 158 lb (71.7 kg)  07/10/21 155 lb (70.3 kg)    Physical Exam   Gen: WDWN NAD elderly wm HEENT: NCAT, conjunctiva not injected, sclera nonicteric NECK:  supple, no thyromegaly, no nodes no TTP pain.  Some tightness on L.  Fair rom for 88yo.  Good strength BUE.  Popping w/movement neck.  CARDIAC: RRR, S1S2+,  EXT:  no edema.  Hands red MSK: no gross abnormalities.  NEURO: A&O x3.  CN II-XII intact.  PSYCH: normal mood. Good eye contact  No shingles rash     Assessment & Plan:   Problem List Items Addressed This Visit   None Visit Diagnoses     Cervicalgia    -  Primary      Cervicalgia-suspect arthritis.  Cont heat.  Pred '40mg'$  daily for 5 days.  Tylenol, muscle rub, stretches.   Hand tingles-reviewed notes neuro-pt hasn't done MRI since labs ok.  Advised to do since labs don't show cervical  stenosis and now he's having some neck pain  Meds ordered this encounter  Medications   predniSONE (DELTASONE) 20 MG tablet    Sig: Take 2 tablets (40 mg total) by mouth daily with breakfast for 5 days.    Dispense:  10 tablet  Refill:  0    Wellington Hampshire, MD

## 2021-09-30 ENCOUNTER — Encounter: Payer: Self-pay | Admitting: Family Medicine

## 2021-09-30 ENCOUNTER — Ambulatory Visit (INDEPENDENT_AMBULATORY_CARE_PROVIDER_SITE_OTHER): Payer: Medicare Other | Admitting: Family Medicine

## 2021-09-30 VITALS — BP 130/64 | HR 55 | Temp 97.8°F | Ht 70.0 in | Wt 153.6 lb

## 2021-09-30 DIAGNOSIS — I714 Abdominal aortic aneurysm, without rupture, unspecified: Secondary | ICD-10-CM

## 2021-09-30 DIAGNOSIS — M79645 Pain in left finger(s): Secondary | ICD-10-CM | POA: Diagnosis not present

## 2021-09-30 DIAGNOSIS — M79644 Pain in right finger(s): Secondary | ICD-10-CM

## 2021-09-30 DIAGNOSIS — E785 Hyperlipidemia, unspecified: Secondary | ICD-10-CM

## 2021-09-30 DIAGNOSIS — I4891 Unspecified atrial fibrillation: Secondary | ICD-10-CM

## 2021-09-30 DIAGNOSIS — D696 Thrombocytopenia, unspecified: Secondary | ICD-10-CM

## 2021-09-30 NOTE — Progress Notes (Signed)
Phone [phone] In person visit   Subjective:   William Mahoney is a 88 y.o. year old very pleasant male patient who presents for/with See problem oriented charting Chief Complaint  Patient presents with   Follow-up   Hypertension    Past Medical History-  Patient Active Problem List   Diagnosis Date Noted   AAA (abdominal aortic aneurysm) without rupture 11/20/2019    Priority: High   Edema 11/10/2016    Priority: High   Atrial fibrillation ([place]) 12/07/2008    Priority: High   Hyperglycemia 06/25/2021    Priority: Medium    PAD (peripheral artery disease) ([place]) 11/20/2019    Priority: Medium    Chronic kidney disease (CKD), stage III (moderate) ([place]) 02/15/2019    Priority: Medium    Aortic atherosclerosis ([place]) 01/05/2019    Priority: Medium    Thrombocytopenia ([place]) 11/10/2016    Priority: Medium    Insomnia 10/19/2014    Priority: Medium    Gout 01/09/2014    Priority: Medium    History of cancer of rectosigmoid junction 01/04/2008    Priority: Medium    Former smoker 06/23/2007    Priority: Medium    Hyperlipidemia, unspecified 02/09/2007    Priority: Medium    Essential hypertension 12/07/2006    Priority: Medium    COPD (chronic obstructive pulmonary disease) ([place]) 12/07/2006    Priority: Medium    BPH (benign prostatic hyperplasia) 12/07/2006    Priority: Medium    Encounter for therapeutic drug monitoring 08/27/2014    Priority: Low   Allergic rhinitis 08/15/2013    Priority: Low   Arthritis of lumbar spine 01/20/2013    Priority: Low   Inguinal hernia 03/21/2010    Priority: Low   BASAL CELL CARCINOMA SKIN LOWER LIMB INCL HIP 03/21/2010    Priority: Low   Actinic keratosis 12/07/2008    Priority: Low   TMJ (temporomandibular joint disorder) 02/09/2007    Priority: Low   URINARY INCONTINENCE 12/07/2006    Priority: Low   PANCREATITIS, HX OF 12/07/2006    Priority: Low    Medications- reviewed and updated Current Outpatient Medications   Medication Sig Dispense Refill   allopurinol (ZYLOPRIM) 300 MG tablet TAKE ONE-HALF TABLET BY MOUTH IN THE EVENING 45 tablet 1   amLODipine (NORVASC) 2.5 MG tablet TAKE 1 TABLET BY MOUTH EVERY DAY 90 tablet 1   colchicine 0.6 MG tablet Take 2 tablets (1.2 mg) at the first sign of flare, followed by 0.6 mg after 1 hour if pain not resolved. Then take once daily after that until pain resolves. Continue allopurinol during gout flares. 180 tablet 2   fluocinonide (LIDEX) 0.05 % external solution SMARTSIG:1 Milliliter(s) Topical Twice Daily     fluticasone (FLONASE) 50 MCG/ACT nasal spray Place 2 sprays into both nostrils daily. 48 g 3   furosemide (LASIX) 20 MG tablet Take 1 tablet (20 mg total) by mouth daily as needed. 90 tablet 1   gabapentin (NEURONTIN) 100 MG capsule 2 capsules at bedtime 60 capsule 4   metoprolol tartrate (LOPRESSOR) 25 MG tablet Take 0.5 tablets (12.5 mg total) by mouth daily. 90 tablet 1   Multiple Vitamin (MULTIVITAMIN) tablet Take 1 tablet by mouth daily.     tamsulosin (FLOMAX) 0.4 MG CAPS capsule TAKE 1 CAPSULE BY MOUTH EVERY DAY 90 capsule 1   triamterene-hydrochlorothiazide (MAXZIDE-25) 37.5-25 MG tablet TAKE 1/2 TABLET BY MOUTH EVERY DAY 45 tablet 1   warfarin (COUMADIN) 2.5 MG tablet TAKE 1 TABLET DAILY EXCEPT  TAKE 1 AND 1/2 TABLETS ON WED AND SAT OR TAKE AS DIRECTED BY CLINIC 120 tablet 1   zolpidem (AMBIEN) 5 MG tablet TAKE 0.5 TABLETS (2.5 MG TOTAL) BY MOUTH AT BEDTIME AS NEEDED FOR SLEEP. 15 tablet 5   No current facility-administered medications for this visit.     Objective:  BP 130/64   Pulse (!) 55   Temp 97.8 F (36.6 C)   Ht '5\' 10"'$  (1.778 m)   Wt 153 lb 9.6 oz (69.7 kg)   SpO2 96%   BMI 22.04 kg/m  Gen: NAD, resting comfortably Improved range of motion of neck compared to last visit CV: bradycardic and iregular no murmurs rubs or gallops Lungs: CTAB no crackles, wheeze, rhonchi Ext: left leg edema 1+, trace on right Skin: warm, dry,  erythematous fingers without warmth- PIP joints somewhat enlarged Neuro: grossly normal, moves all extremities     Assessment and Plan   #Cervicalgia- patient has noted some improvement in neck pain on prednisone Dr. Cherlynn Kaiser prescribed 5 days ago. Pain down from 4-5/10 to 1-2/10 and we discussed may have continued improvement. Could refer to PT but wants to hold off for now  # Atrial fibrillation-follows with Dr. Gwenlyn Found of cardiology S: Rate controlled with metoprolol 25 mg-only takes 12.5 mg/day per Dr. Gwenlyn Found Anticoagulated with Coumadin-follows in our clinic A/P: appropriately anticoagulated and rate controlled - continue current medication   #Ongoing redness in fingers- has seem derm in the past with several treatments/topically and they did not think derm related. He asks about Rheumatoid arthritis which son has- no morning stiffness-doubt related. Dr. Ubaldo Glassing actually referred him to Dr. Tomi Likens- thought possibly neuropathy related- see below -gets pain in fingers and son has rheumatoid arthritis. No AM stiffness but he asks for ANA and RF- ordered  #peripheral neuropathy- started on gabapentin by Dr. Tomi Likens- he is not sure if gabapentin 200 mg is helping him- used to take tylenol- takes half an [place] if wakes up in early morning hours- does not drive for 8 hours after ambien  #Aortic aneurysm-continues to have at least yearly checks most recently 03/17/2021 with annual recheck planned   #hypertension #Venous insufficiency S: medication: Amlodipine 2.5 mg, triamterene hydrochlorothiazide 37.5-25 mg, Lasix 20 mg as needed for edema, metoprolol 25 mg-only takes 12.5 mg/day per Dr. Gwenlyn Found A/P: Controlled. Continue current medications.   #PAD added by vascular surgery 11/20/2019-symptoms: no claudication  #hyperlipidemia S: Medication:no statin   A/P: no concrete evidence of PAD on imaging/other than aneurysm- not enoguh to start statin and no claudication. He is also on coumadin- we opted to  continue this alone     #Insomnia S: Medication: Ambien 5 mg (only taking half)-aware of fall risk- no falls recently  A/P: glad tolerating only half tablet  #Gout S: 0 flares in last 6 months on allopurinol 300 mg with colchicine on hand -Uric acid typically below 6 A/P:doing well- continue current meds    #Mild thrombocytopenia-ongoing chronic issue but other cell lines normal-continue to monitor at least every 6 months- checked in march but will check today   Recommended follow up: Return in about 5 months (around 03/02/2022) for physical or sooner if needed.Schedule b4 you leave. Future Appointments  Date Time Provider [place]  10/01/2021  3:30 PM [facility] COUMADIN [facility]  11/25/2021  4:00 PM Gardiner Barefoot, DPM [facility]  12/26/2021 11:00 AM [facility] HEALTH COACH [facility]  01/26/2022  3:30 PM Pieter Partridge, DO [facility] None    Lab/Order  associations:   ICD-10-CM   1. Atrial fibrillation, unspecified type (HCC)  I48.91 Comprehensive metabolic panel    2. Abdominal aortic aneurysm (AAA) without rupture, unspecified part ([place])  I71.40     3. Pain in finger of both hands  M79.645 Rheumatoid Factor   M79.644 Antinuclear Antib (ANA)    4. Thrombocytopenia (HCC)  D69.6 CBC with Differential/Platelet    5. Hyperlipidemia, unspecified hyperlipidemia type  E78.5 Comprehensive metabolic panel    CBC with Differential/Platelet      No orders of the defined types were placed in this encounter.   Return precautions advised.  [name], MD

## 2021-09-30 NOTE — Patient Instructions (Addendum)
No changes today. Thanks for doing labs  Recommended follow up: Return in about 5 months (around 03/02/2022) for physical or sooner if needed.Schedule b4 you leave.

## 2021-10-01 ENCOUNTER — Ambulatory Visit: Payer: Medicare Other

## 2021-10-01 LAB — CBC WITH DIFFERENTIAL/PLATELET
Basophils Absolute: 0 10*3/uL (ref 0.0–0.1)
Basophils Relative: 0.6 % (ref 0.0–3.0)
Eosinophils Absolute: 0 10*3/uL (ref 0.0–0.7)
Eosinophils Relative: 0.1 % (ref 0.0–5.0)
HCT: 40.2 % (ref 39.0–52.0)
Hemoglobin: 13.3 g/dL (ref 13.0–17.0)
Lymphocytes Relative: 5.1 % — ABNORMAL LOW (ref 12.0–46.0)
Lymphs Abs: 0.4 10*3/uL — ABNORMAL LOW (ref 0.7–4.0)
MCHC: 33.1 g/dL (ref 30.0–36.0)
MCV: 91.7 fl (ref 78.0–100.0)
Monocytes Absolute: 0.4 10*3/uL (ref 0.1–1.0)
Monocytes Relative: 4.6 % (ref 3.0–12.0)
Neutro Abs: 6.9 10*3/uL (ref 1.4–7.7)
Neutrophils Relative %: 89.6 % — ABNORMAL HIGH (ref 43.0–77.0)
Platelets: 139 10*3/uL — ABNORMAL LOW (ref 150.0–400.0)
RBC: 4.39 Mil/uL (ref 4.22–5.81)
RDW: 14.1 % (ref 11.5–15.5)
WBC: 7.7 10*3/uL (ref 4.0–10.5)

## 2021-10-01 LAB — COMPREHENSIVE METABOLIC PANEL
ALT: 17 U/L (ref 0–53)
AST: 21 U/L (ref 0–37)
Albumin: 4 g/dL (ref 3.5–5.2)
Alkaline Phosphatase: 69 U/L (ref 39–117)
BUN: 28 mg/dL — ABNORMAL HIGH (ref 6–23)
CO2: 29 mEq/L (ref 19–32)
Calcium: 9.4 mg/dL (ref 8.4–10.5)
Chloride: 102 mEq/L (ref 96–112)
Creatinine, Ser: 1.05 mg/dL (ref 0.40–1.50)
GFR: 60.99 mL/min (ref >60.00)
Glucose, Bld: 161 mg/dL — ABNORMAL HIGH (ref 70–99)
Potassium: 4 mEq/L (ref 3.5–5.1)
Sodium: 139 mEq/L (ref 135–145)
Total Bilirubin: 0.7 mg/dL (ref 0.2–1.2)
Total Protein: 6.7 g/dL (ref 6.0–8.3)

## 2021-10-01 LAB — ANA: Anti Nuclear Antibody (ANA): NEGATIVE

## 2021-10-01 LAB — RHEUMATOID FACTOR: Rheumatoid fact SerPl-aCnc: <14 IU/mL (ref <14)

## 2021-10-04 ENCOUNTER — Other Ambulatory Visit: Payer: Self-pay | Admitting: Family Medicine

## 2021-10-13 ENCOUNTER — Ambulatory Visit: Payer: Medicare Other

## 2021-10-15 ENCOUNTER — Telehealth: Payer: Self-pay

## 2021-10-15 NOTE — Telephone Encounter (Signed)
Pt has missed or cancelled coumadin clinic apts that have been scheduled the last 2 times. Contacted pt's daughter, William Mahoney, who reports her mother is currently in the hospital and that is the reason for the last cancellation. Advised nurse will be out of the office until 7/6 and to contact office at that time to get pt RS. Advised if anything is needed before then to contact the office's main number. William Mahoney verbalized understanding.

## 2021-10-29 ENCOUNTER — Telehealth: Payer: Self-pay | Admitting: Cardiovascular Disease

## 2021-10-29 NOTE — Telephone Encounter (Signed)
Pt requests a refill for the following medication.   metoprolol tartrate (LOPRESSOR) 25 MG tablet [[id number]]    [id number] [place] [place] [address] [place] [place] Phone:  [phone] Fax:  [phone] DEA #:  [id number]

## 2021-10-31 NOTE — Telephone Encounter (Signed)
Unable to process.  Please route to appropriate party.

## 2021-11-03 MED ORDER — METOPROLOL TARTRATE 25 MG PO TABS: 12.5000 mg | ORAL_TABLET | Freq: Every day | ORAL | 3 refills | Status: DC

## 2021-11-03 NOTE — Addendum Note (Signed)
Addended by: Beatrix Fetters on: 11/03/2021 03:23 PM   Modules accepted: Orders

## 2021-11-11 NOTE — Telephone Encounter (Signed)
Very reasonable- will reach out about the loss

## 2021-11-11 NOTE — Telephone Encounter (Signed)
Per pt's daughter, Maudie Mercury, reports pt lost his wife and she lost her mother two days ago; they will go to [place] for burial and funeral. She reports they will be back the weekend of 8/5 and to contact her on 8/7 to get him scheduled for coumadin clinic.

## 2021-11-19 ENCOUNTER — Telehealth: Payer: Self-pay | Admitting: Family Medicine

## 2021-11-19 MED ORDER — ALPRAZOLAM 0.25 MG PO TABS: 0.2500 mg | ORAL_TABLET | Freq: Two times a day (BID) | ORAL | 0 refills | Status: DC | PRN

## 2021-11-19 NOTE — Telephone Encounter (Signed)
I sent this in- make sure to review signature on this with him

## 2021-11-19 NOTE — Telephone Encounter (Signed)
See below

## 2021-11-19 NOTE — Telephone Encounter (Signed)
Caller states -Patient has been very nervous and upset since the passing of his wife -Patient and caller have to fly to [place] for wife's funeral, Franki Stemen.   Caller requests:  -A small dose and temporary prescription of xanax be sent in for patient TODAY.

## 2021-11-19 NOTE — Telephone Encounter (Signed)
Called and lm for pt tcb. 

## 2021-11-23 ENCOUNTER — Other Ambulatory Visit: Payer: Self-pay | Admitting: Family Medicine

## 2021-11-25 ENCOUNTER — Ambulatory Visit (INDEPENDENT_AMBULATORY_CARE_PROVIDER_SITE_OTHER): Payer: Medicare Other | Admitting: Podiatry

## 2021-11-25 ENCOUNTER — Encounter: Payer: Self-pay | Admitting: Podiatry

## 2021-11-25 DIAGNOSIS — M79674 Pain in right toe(s): Secondary | ICD-10-CM

## 2021-11-25 DIAGNOSIS — B351 Tinea unguium: Secondary | ICD-10-CM | POA: Diagnosis not present

## 2021-11-25 DIAGNOSIS — I739 Peripheral vascular disease, unspecified: Secondary | ICD-10-CM | POA: Diagnosis not present

## 2021-11-25 DIAGNOSIS — N1831 Chronic kidney disease, stage 3a: Secondary | ICD-10-CM | POA: Diagnosis not present

## 2021-11-25 DIAGNOSIS — M79675 Pain in left toe(s): Secondary | ICD-10-CM | POA: Diagnosis not present

## 2021-11-25 DIAGNOSIS — Q828 Other specified congenital malformations of skin: Secondary | ICD-10-CM

## 2021-11-25 DIAGNOSIS — D696 Thrombocytopenia, unspecified: Secondary | ICD-10-CM | POA: Diagnosis not present

## 2021-11-25 NOTE — Progress Notes (Signed)
This patient returns to my office for at risk foot care.  This patient requires this care by a professional since this patient will be at risk due to having  PAD, CKD coagulation defect and thrombocytopenia.  Patient is taking coumadin.  This patient is unable to cut nails himself since the patient cannot reach his nails.These nails are painful walking and wearing shoes. Patient also says he painful callus on the bottom of his left heel. This patient presents for at risk foot care today.  General Appearance  Alert, conversant and in no acute stress.  Vascular  Dorsalis pedis and posterior tibial  pulses are  weakly palpable  bilaterally.  Capillary return is within normal limits  bilaterally. Temperature is within normal limits  bilaterally. Venous disease feet dorsally  B/l.  Neurologic  Senn-Weinstein monofilament wire test within normal limits  bilaterally. Muscle power within normal limits bilaterally.  Nails Thick disfigured discolored nails with subungual debris  from hallux to fifth toes bilaterally. No evidence of bacterial infection or drainage bilaterally.  Orthopedic  No limitations of motion  feet .  No crepitus or effusions noted.  No bony pathology or digital deformities noted.  Skin  normotropic skin noted bilaterally.  No signs of infections or ulcers noted.   Porokeratosis left heel.  Onychomycosis  Pain in right toes  Pain in left toes  Porokeratosis left heel.    Consent was obtained for treatment procedures.   Mechanical debridement of nails 1-5  bilaterally performed with a nail nipper.  Filed with dremel without incident. Debride porokeratosis left heel with # 15 blade.     Return office visit    4 months                  Told patient to return for periodic foot care and evaluation due to potential at risk complications.   Gardiner Barefoot DPM

## 2021-11-28 NOTE — Telephone Encounter (Signed)
Contacted pt's daughter, Vania Rea, and scheduled pt for coumadin clinic on 8/14 at 3:45

## 2021-12-01 ENCOUNTER — Ambulatory Visit (INDEPENDENT_AMBULATORY_CARE_PROVIDER_SITE_OTHER): Payer: Medicare Other

## 2021-12-01 DIAGNOSIS — Z7901 Long term (current) use of anticoagulants: Secondary | ICD-10-CM

## 2021-12-01 LAB — POCT INR: INR: 2 (ref 2.0–3.0)

## 2021-12-01 NOTE — Progress Notes (Signed)
Continue 1 tablet daily except take 1 1/2 on Mondays, Wednesdays and Fridays.  Re-check in 6 wks.

## 2021-12-01 NOTE — Patient Instructions (Addendum)
Pre visit review using our clinic review tool, if applicable. No additional management support is needed unless otherwise documented below in the visit note.  Continue 1 tablet daily except take 1 1/2 on Mondays, Wednesdays and Fridays.  Re-check in 6 wks.

## 2021-12-09 ENCOUNTER — Telehealth (INDEPENDENT_AMBULATORY_CARE_PROVIDER_SITE_OTHER): Payer: Medicare Other | Admitting: Family Medicine

## 2021-12-09 ENCOUNTER — Encounter: Payer: Self-pay | Admitting: Family Medicine

## 2021-12-09 VITALS — BP 132/72 | HR 61 | Ht 70.0 in | Wt 153.0 lb

## 2021-12-09 DIAGNOSIS — R1013 Epigastric pain: Secondary | ICD-10-CM | POA: Diagnosis not present

## 2021-12-09 DIAGNOSIS — G4700 Insomnia, unspecified: Secondary | ICD-10-CM

## 2021-12-09 DIAGNOSIS — I10 Essential (primary) hypertension: Secondary | ICD-10-CM

## 2021-12-09 DIAGNOSIS — R140 Abdominal distension (gaseous): Secondary | ICD-10-CM

## 2021-12-09 MED ORDER — ZOLPIDEM TARTRATE 5 MG PO TABS: 5.0000 mg | ORAL_TABLET | Freq: Every evening | ORAL | 5 refills | Status: DC | PRN

## 2021-12-09 NOTE — Patient Instructions (Signed)
Flu shot- we should have these available within a month or two but please let us know if you get at outside pharmacy  

## 2021-12-09 NOTE — Progress Notes (Signed)
Phone [phone] Virtual visit via Video note   Subjective:  Chief complaint: Chief Complaint  Patient presents with   not sleeping well/not digesting food well    Pt states he is feeling good today and states it is hard for him to fall asleep and when he does fall asleep its usulally only about 2-3 hours, pt states his appetite is "ok".    This visit type was conducted due to national recommendations for restrictions regarding the COVID-19 Pandemic (e.g. social distancing).  This format is felt to be most appropriate for this patient at this time balancing risks to patient and risks to population by having him in for in person visit.  No physical exam was performed (except for noted visual exam or audio findings with Telehealth visits).    Our team/I connected with William Mahoney at  4:00 PM EDT by a video enabled telemedicine application (doxy.me or caregility through epic) and verified that I am speaking with the correct person using two identifiers.  Location patient: [facility] Location provider: [facility], office Persons participating in the virtual visit:  patient, daughter  Our team/I discussed the limitations of evaluation and management by telemedicine and the availability of in person appointments. In light of current covid-19 pandemic, patient also understands that we are trying to protect them by minimizing in office contact if at all possible.  The patient expressed consent for telemedicine visit and agreed to proceed. Patient understands insurance will be billed.   Past Medical History-  Patient Active Problem List   Diagnosis Date Noted   AAA (abdominal aortic aneurysm) without rupture 11/20/2019    Priority: High   Edema 11/10/2016    Priority: High   Atrial fibrillation ([place]) 12/07/2008    Priority: High   Hyperglycemia 06/25/2021    Priority: Medium    PAD (peripheral artery disease) ([place]) 11/20/2019    Priority: Medium    Chronic kidney disease (CKD), stage III  (moderate) ([place]) 02/15/2019    Priority: Medium    Aortic atherosclerosis ([place]) 01/05/2019    Priority: Medium    Thrombocytopenia ([place]) 11/10/2016    Priority: Medium    Insomnia 10/19/2014    Priority: Medium    Gout 01/09/2014    Priority: Medium    History of cancer of rectosigmoid junction 01/04/2008    Priority: Medium    Former smoker 06/23/2007    Priority: Medium    Hyperlipidemia, unspecified 02/09/2007    Priority: Medium    Essential hypertension 12/07/2006    Priority: Medium    COPD (chronic obstructive pulmonary disease) ([place]) 12/07/2006    Priority: Medium    BPH (benign prostatic hyperplasia) 12/07/2006    Priority: Medium    Encounter for therapeutic drug monitoring 08/27/2014    Priority: Low   Allergic rhinitis 08/15/2013    Priority: Low   Arthritis of lumbar spine 01/20/2013    Priority: Low   Inguinal hernia 03/21/2010    Priority: Low   BASAL CELL CARCINOMA SKIN LOWER LIMB INCL HIP 03/21/2010    Priority: Low   Actinic keratosis 12/07/2008    Priority: Low   TMJ (temporomandibular joint disorder) 02/09/2007    Priority: Low   URINARY INCONTINENCE 12/07/2006    Priority: Low   PANCREATITIS, HX OF 12/07/2006    Priority: Low   Porokeratosis 11/25/2021    Medications- reviewed and updated Current Outpatient Medications  Medication Sig Dispense Refill   allopurinol (ZYLOPRIM) 300 MG tablet TAKE ONE-HALF TABLET BY MOUTH IN THE EVENING  45 tablet 1   ALPRAZolam (XANAX) 0.25 MG tablet Take 1 tablet (0.25 mg total) by mouth 2 (two) times daily as needed for anxiety (do not drive for 8 hours after taking. have someone by your side with walking if you take this. do not take within 8 hours of ambien.). 20 tablet 0   amLODipine (NORVASC) 2.5 MG tablet TAKE 1 TABLET BY MOUTH EVERY DAY 90 tablet 1   colchicine 0.6 MG tablet Take 2 tablets (1.2 mg) at the first sign of flare, followed by 0.6 mg after 1 hour if pain not resolved. Then take once daily after that  until pain resolves. Continue allopurinol during gout flares. 180 tablet 2   fluocinonide (LIDEX) 0.05 % external solution SMARTSIG:1 Milliliter(s) Topical Twice Daily     fluticasone (FLONASE) 50 MCG/ACT nasal spray Place 2 sprays into both nostrils daily. 48 g 3   furosemide (LASIX) 20 MG tablet TAKE 1 TABLET BY MOUTH EVERY DAY AS NEEDED 90 tablet 1   metoprolol tartrate (LOPRESSOR) 25 MG tablet Take 0.5 tablets (12.5 mg total) by mouth daily. 45 tablet 3   Multiple Vitamin (MULTIVITAMIN) tablet Take 1 tablet by mouth daily.     tamsulosin (FLOMAX) 0.4 MG CAPS capsule TAKE 1 CAPSULE BY MOUTH EVERY DAY 90 capsule 1   triamterene-hydrochlorothiazide (MAXZIDE-25) 37.5-25 MG tablet TAKE 1/2 TABLET BY MOUTH EVERY DAY 45 tablet 1   warfarin (COUMADIN) 2.5 MG tablet TAKE 1 TABLET DAILY EXCEPT TAKE 1 AND 1/2 TABLETS ON WED AND SAT OR TAKE AS DIRECTED BY CLINIC 120 tablet 1   zolpidem (AMBIEN) 5 MG tablet Take 1 tablet (5 mg total) by mouth at bedtime as needed for sleep (dose increase. does not drive. family will be in home when he takes full dose. if any falls will stop). 30 tablet 5   No current facility-administered medications for this visit.     Objective:  BP 132/72   Pulse 61   Ht '5\' 10"'$  (1.778 m)   Wt 153 lb (69.4 kg)   BMI 21.95 kg/m  self reported vitals Gen: appears fatigued Lungs: nonlabored, normal respiratory rate  Abdomen not visibly distended     Assessment and Plan   #Insomnia S: Medication: Ambien 2.5 mg-aware of fall risk  -staying up to 1-2 in the am and then only sleeping 2-3 hours.  - lost wife recently and another close family member a few months ago - a lot of stress trying to care for wife before she passed -has not been sleeping well -gabapentin stopped 5 days ago- pain in hands not worse A/P: Insomnia with very poor control since stress increase with caring for wife before her death-we will short-term increase Ambien back to 5 mg but encouraged him to still  try just half tablet if things can tolerate.  We discussed alternate such as adding trazodone low-dose and that may be neck step.  Daughter lives with him and can watch closely-if has any falls we will have to stop.  He does not drive  # bloating/epigastric abdominal pain S:upper abdominal pain starting about a week ago really bothering him was in fact painful- still feels sore but pain is improving as is gas.  Gas- x was somewhat helpful. No shortness of breath. Some nausea- no vomiting A/P: Patient with bloating/epigastric abdominal pain starting about a week ago that is steadily improving with Gas-X.  I wonder if also has element of reflux and recommended trial of Pepcid over-the-counter twice daily with meals.  I also wonder if stress from poor sleep is contributing-if he fails to continue to improve will need follow-up in the office for exam and potentially labs or imaging - no exertional element reported- seems to be worse with food- doubt cardiac  #Aortic aneurysm-continues to have at least yearly checks most recently 03/17/2021 with annual recheck planned- we consideed early recheck with epigastric pain but with improvement with gas- x opted to hold off. Originally saw august 2022 imaging that was slightly enlarged but comparing 11/01/19 to 03/17/21 scans stable- attempted to reach patient but unable and VM did not appear set up- will discuss at next visit    #hypertension S: medication: Amlodipine 2.5 mg, triamterene hydrochlorothiazide 37.5-25 mg, Lasix 20 mg as needed for edema, metoprolol 25 mg-only takes 12.5 mg/day per Dr. Gwenlyn Found  BP Readings from Last 3 Encounters:  12/09/21 132/72  09/30/21 130/64  09/25/21 133/67   A/P:  Controlled. Continue current medications.    Recommended follow up: 2-3 months follow up recommended or sooner if needed Future Appointments  Date Time Provider [place]  12/26/2021 11:00 AM [facility] HEALTH COACH [facility]  01/12/2022  3:45 PM [facility]  01/26/2022  3:30 PM Pieter Partridge, DO [facility] None  03/02/2022  3:45 PM Gardiner Barefoot, DPM [facility]    Lab/Order associations:   ICD-10-CM   1. Insomnia, unspecified type  G47.00     2. Essential hypertension  I10     3. Bloating  R14.0     4. Abdominal distension (gaseous)  R14.0     5. Epigastric abdominal pain  R10.13       Meds ordered this encounter  Medications   zolpidem (AMBIEN) 5 MG tablet    Sig: Take 1 tablet (5 mg total) by mouth at bedtime as needed for sleep (dose increase. does not drive. family will be in home when he takes full dose. if any falls will stop).    Dispense:  30 tablet    Refill:  5    Return precautions advised.  [name], MD

## 2021-12-15 ENCOUNTER — Ambulatory Visit: Payer: Medicare Other | Admitting: Family Medicine

## 2021-12-16 ENCOUNTER — Other Ambulatory Visit: Payer: Self-pay | Admitting: Family Medicine

## 2021-12-26 ENCOUNTER — Ambulatory Visit: Payer: Medicare Other

## 2021-12-26 ENCOUNTER — Ambulatory Visit (INDEPENDENT_AMBULATORY_CARE_PROVIDER_SITE_OTHER): Payer: Medicare Other

## 2021-12-26 VITALS — BP 112/62 | HR 64 | Temp 97.9°F | Wt 148.6 lb

## 2021-12-26 DIAGNOSIS — Z0000 Encounter for general adult medical examination without abnormal findings: Secondary | ICD-10-CM | POA: Diagnosis not present

## 2021-12-26 NOTE — Patient Instructions (Signed)
William Mahoney , Thank you for taking time to come for your Medicare Wellness Visit. I appreciate your ongoing commitment to your health goals. Please review the following plan we discussed and let me know if I can assist you in the future.   Screening recommendations/referrals: Colonoscopy: no longer required  Recommended yearly ophthalmology/optometry visit for glaucoma screening and checkup Recommended yearly dental visit for hygiene and checkup  Vaccinations: Influenza vaccine: done 02/14/21 repeat every year  Pneumococcal vaccine: Up to date Tdap vaccine: done 01/09/18 repeat every 10 years  Shingles vaccine: Shingrix discussed. Please contact your pharmacy for coverage information.    Covid-19: completed 1/14, & 05/22/19  Advanced directives: Please bring a copy of your health care power of attorney and living will to the office at your convenience.  Conditions/risks identified: none at this time   Next appointment: Follow up in one year for your annual wellness visit.   Preventive Care 53 Years and Older, Male Preventive care refers to lifestyle choices and visits with your health care provider that can promote health and wellness. What does preventive care include? A yearly physical exam. This is also called an annual well check. Dental exams once or twice a year. Routine eye exams. Ask your health care provider how often you should have your eyes checked. Personal lifestyle choices, including: Daily care of your teeth and gums. Regular physical activity. Eating a healthy diet. Avoiding tobacco and drug use. Limiting alcohol use. Practicing safe sex. Taking low doses of aspirin every day. Taking vitamin and mineral supplements as recommended by your health care provider. What happens during an annual well check? The services and screenings done by your health care provider during your annual well check will depend on your age, overall health, lifestyle risk factors, and family  history of disease. Counseling  Your health care provider may ask you questions about your: Alcohol use. Tobacco use. Drug use. Emotional well-being. Home and relationship well-being. Sexual activity. Eating habits. History of falls. Memory and ability to understand (cognition). Work and work Statistician. Screening  You may have the following tests or measurements: Height, weight, and BMI. Blood pressure. Lipid and cholesterol levels. These may be checked every 5 years, or more frequently if you are over 40 years old. Skin check. Lung cancer screening. You may have this screening every year starting at age 69 if you have a 30-pack-year history of smoking and currently smoke or have quit within the past 15 years. Fecal occult blood test (FOBT) of the stool. You may have this test every year starting at age 74. Flexible sigmoidoscopy or colonoscopy. You may have a sigmoidoscopy every 5 years or a colonoscopy every 10 years starting at age 70. Prostate cancer screening. Recommendations will vary depending on your family history and other risks. Hepatitis C blood test. Hepatitis B blood test. Sexually transmitted disease (STD) testing. Diabetes screening. This is done by checking your blood sugar (glucose) after you have not eaten for a while (fasting). You may have this done every 1-3 years. Abdominal aortic aneurysm (AAA) screening. You may need this if you are a current or former smoker. Osteoporosis. You may be screened starting at age 8 if you are at high risk. Talk with your health care provider about your test results, treatment options, and if necessary, the need for more tests. Vaccines  Your health care provider may recommend certain vaccines, such as: Influenza vaccine. This is recommended every year. Tetanus, diphtheria, and acellular pertussis (Tdap, Td) vaccine. You may  need a Td booster every 10 years. Zoster vaccine. You may need this after age 74. Pneumococcal  13-valent conjugate (PCV13) vaccine. One dose is recommended after age 58. Pneumococcal polysaccharide (PPSV23) vaccine. One dose is recommended after age 31. Talk to your health care provider about which screenings and vaccines you need and how often you need them. This information is not intended to replace advice given to you by your health care provider. Make sure you discuss any questions you have with your health care provider. Document Released: 05/03/2015 Document Revised: 12/25/2015 Document Reviewed: 02/05/2015 Elsevier Interactive Patient Education  2017 [place] Prevention in the Home Falls can cause injuries. They can happen to people of all ages. There are many things you can do to make your home safe and to help prevent falls. What can I do on the outside of my home? Regularly fix the edges of walkways and driveways and fix any cracks. Remove anything that might make you trip as you walk through a door, such as a raised step or threshold. Trim any bushes or trees on the path to your home. Use bright outdoor lighting. Clear any walking paths of anything that might make someone trip, such as rocks or tools. Regularly check to see if handrails are loose or broken. Make sure that both sides of any steps have handrails. Any raised decks and porches should have guardrails on the edges. Have any leaves, snow, or ice cleared regularly. Use sand or salt on walking paths during winter. Clean up any spills in your garage right away. This includes oil or grease spills. What can I do in the bathroom? Use night lights. Install grab bars by the toilet and in the tub and shower. Do not use towel bars as grab bars. Use non-skid mats or decals in the tub or shower. If you need to sit down in the shower, use a plastic, non-slip stool. Keep the floor dry. Clean up any water that spills on the floor as soon as it happens. Remove soap buildup in the tub or shower regularly. Attach  bath mats securely with double-sided non-slip rug tape. Do not have throw rugs and other things on the floor that can make you trip. What can I do in the bedroom? Use night lights. Make sure that you have a light by your bed that is easy to reach. Do not use any sheets or blankets that are too big for your bed. They should not hang down onto the floor. Have a firm chair that has side arms. You can use this for support while you get dressed. Do not have throw rugs and other things on the floor that can make you trip. What can I do in the kitchen? Clean up any spills right away. Avoid walking on wet floors. Keep items that you use a lot in easy-to-reach places. If you need to reach something above you, use a strong step stool that has a grab bar. Keep electrical cords out of the way. Do not use floor polish or wax that makes floors slippery. If you must use wax, use non-skid floor wax. Do not have throw rugs and other things on the floor that can make you trip. What can I do with my stairs? Do not leave any items on the stairs. Make sure that there are handrails on both sides of the stairs and use them. Fix handrails that are broken or loose. Make sure that handrails are as long as the stairways.  Check any carpeting to make sure that it is firmly attached to the stairs. Fix any carpet that is loose or worn. Avoid having throw rugs at the top or bottom of the stairs. If you do have throw rugs, attach them to the floor with carpet tape. Make sure that you have a light switch at the top of the stairs and the bottom of the stairs. If you do not have them, ask someone to add them for you. What else can I do to help prevent falls? Wear shoes that: Do not have high heels. Have rubber bottoms. Are comfortable and fit you well. Are closed at the toe. Do not wear sandals. If you use a stepladder: Make sure that it is fully opened. Do not climb a closed stepladder. Make sure that both sides of the  stepladder are locked into place. Ask someone to hold it for you, if possible. Clearly mark and make sure that you can see: Any grab bars or handrails. First and last steps. Where the edge of each step is. Use tools that help you move around (mobility aids) if they are needed. These include: Canes. Walkers. Scooters. Crutches. Turn on the lights when you go into a dark area. Replace any light bulbs as soon as they burn out. Set up your furniture so you have a clear path. Avoid moving your furniture around. If any of your floors are uneven, fix them. If there are any pets around you, be aware of where they are. Review your medicines with your doctor. Some medicines can make you feel dizzy. This can increase your chance of falling. Ask your doctor what other things that you can do to help prevent falls. This information is not intended to replace advice given to you by your health care provider. Make sure you discuss any questions you have with your health care provider. Document Released: 01/31/2009 Document Revised: 09/12/2015 Document Reviewed: 05/11/2014 Elsevier Interactive Patient Education  2017 [employer].

## 2021-12-26 NOTE — Progress Notes (Addendum)
Subjective:   William Mahoney is a 88 y.o. male who presents for Medicare Annual/Subsequent preventive examination.  Review of Systems     Cardiac Risk Factors include: advanced age (>94mn, >>60women);male gender;hypertension;dyslipidemia     Objective:    Today's Vitals   12/26/21 1059  BP: 112/62  Pulse: 64  Temp: 97.9 F (36.6 C)  SpO2: 99%  Weight: 148 lb 9.6 oz (67.4 kg)   Body mass index is 21.32 kg/m.     12/26/2021   11:04 AM 05/10/2021    1:32 PM 12/21/2020   10:18 AM 11/28/2020    8:07 AM 12/09/2018   11:30 AM 11/18/2018    3:07 PM 01/09/2018   11:51 AM  Advanced Directives  Does Patient Have a Medical Advance Directive? Yes No Yes Yes No No;Yes No  Type of Advance Directive Living will;Healthcare Power of Attorney  Living will [place] will  [place]an YsidroLiving will   Does patient want to make changes to medical advance directive?    No - Patient declined  No - Patient declined   [place] of [place]? No - copy requested  No - copy requested   No - copy requested   Would patient like information on creating a medical advance directive?  No - Patient declined   No - Patient declined  No - Patient declined    Current Medications (verified) Outpatient Encounter Medications as of 12/26/2021  Medication Sig   allopurinol (ZYLOPRIM) 300 MG tablet TAKE ONE-HALF TABLET BY MOUTH IN THE EVENING   ALPRAZolam (XANAX) 0.25 MG tablet Take 1 tablet (0.25 mg total) by mouth 2 (two) times daily as needed for anxiety (do not drive for 8 hours after taking. have someone by your side with walking if you take this. do not take within 8 hours of ambien.).   amLODipine (NORVASC) 2.5 MG tablet TAKE 1 TABLET BY MOUTH EVERY DAY   furosemide (LASIX) 20 MG tablet TAKE 1 TABLET BY MOUTH EVERY DAY AS NEEDED   metoprolol tartrate (LOPRESSOR) 25 MG tablet Take 0.5 tablets (12.5 mg total) by mouth daily.   Multiple Vitamin  (MULTIVITAMIN) tablet Take 1 tablet by mouth daily.   tamsulosin (FLOMAX) 0.4 MG CAPS capsule TAKE 1 CAPSULE BY MOUTH EVERY DAY   triamterene-hydrochlorothiazide (MAXZIDE-25) 37.5-25 MG tablet TAKE 1/2 TABLET BY MOUTH EVERY DAY   warfarin (COUMADIN) 2.5 MG tablet TAKE 1 TABLET DAILY EXCEPT TAKE 1 AND 1/2 TABLETS ON WED AND SAT OR TAKE AS DIRECTED BY CLINIC   zolpidem (AMBIEN) 5 MG tablet Take 1 tablet (5 mg total) by mouth at bedtime as needed for sleep (dose increase. does not drive. family will be in home when he takes full dose. if any falls will stop).   colchicine 0.6 MG tablet Take 2 tablets (1.2 mg) at the first sign of flare, followed by 0.6 mg after 1 hour if pain not resolved. Then take once daily after that until pain resolves. Continue allopurinol during gout flares. (Patient not taking: Reported on 12/26/2021)   fluocinonide (LIDEX) 0.05 % external solution SMARTSIG:1 Milliliter(s) Topical Twice Daily (Patient not taking: Reported on 12/26/2021)   fluticasone (FLONASE) 50 MCG/ACT nasal spray Place 2 sprays into both nostrils daily. (Patient not taking: Reported on 12/26/2021)   No facility-administered encounter medications on file as of 12/26/2021.    Allergies (verified) Patient has no known allergies.   History: Past Medical History:  Diagnosis Date   BPH (  benign prostatic hypertrophy)    Chronic atrial fibrillation [facility])    cardiologist--   dr berry   COPD (chronic obstructive pulmonary disease) ([place])    Diverticulosis of colon    MODERATE LEFT SIDE   Epididymal cyst    w/ epididymalitis   Full dentures    Gout    Hand foot syndrome    secondary to chemotherapy (Xeloda)   cold hands/feet   Hearing loss    History of alcohol abuse    quit drinking in the mid 80's   History of cirrhosis of liver    alcoholic--  hx alcohol abuse -- quit drinking 1980's   History of gout    History of melanoma excision    2013--  left ear lobe and back of hand   History of pancreatitis     2008   History of rectal cancer oncologist-  dr Benay Spice--  no recurrence   dx Sept 2009--  Stage II (T3N0)  s/p  sigmoid colectomy & low anterior resection 04-18-2008  and chemoradiation 2010   History of shingles 07/27/2010   History of squamous cell carcinoma excision    left lower leg   Hyperlipidemia    Hypertension    Loose stools    DUE TO ANTIBIOTICS   Macular degeneration    not sure which eye   OA (osteoarthritis)    RBBB (right bundle branch block)    Renal lesion    chronic-- left side   Sciatica of right side    Urge urinary incontinence    intermittant   Past Surgical History:  Procedure Laterality Date   CARDIAC CATHETERIZATION  2001  approx.  in [place]  IMPLANT, BILATERAL     COLONOSCOPY  last one 06-27-2012   EXPLORATORY LAPAROTOMY/  LOW ANTERIOR RESECTION/  SIGMOID COLECTOMY  04-18-2008   DR INGRAM   INCISION AND DRAINAGE ABSCESS N/A 11/02/2014   Procedure: INCISION AND DRAINAGE OF SCROTUM;  Surgeon: Ardis Hughs, MD;  Location: [facility];  Service: Urology;  Laterality: N/A;   INGUINAL HERNIA REPAIR Right 03/23/2014   Procedure: HERNIA REPAIR INGUINAL ADULT OPEN REPAIR RIGHT INGUINAL HERNIA REPAIR;  Surgeon: Fanny Skates, MD;  Location: [place];  Service: General;  Laterality: Right;   INSERTION OF MESH Right 03/23/2014   Procedure: INSERTION OF MESH;  Surgeon: Fanny Skates, MD;  Location: [facility];  Service: General;  Laterality: Right;   LAPAROSCOPIC CHOLECYSTECTOMY  1990's   MASS EXCISION N/A 11/02/2014   Procedure: EXCISION OF EPIDIDYMAL CYST;  Surgeon: Ardis Hughs, MD;  Location[facility];  Service: Urology;  Laterality: N/A;   TONSILLECTOMY  as child   TRANSTHORACIC ECHOCARDIOGRAM  05-11-2008   pseudonormal LV filling pattern,  ef 60-65%/  mild to moderate MV calcification no stenosis w/ mild to moderate regurg./  mild LAE and RAE/  mild TR   Family History  Problem  Relation Age of Onset   Hypertension Mother    Lymphoma Father    Lymphoma Other    Stroke Other        1st degree relative   Colon cancer Neg Hx    Esophageal cancer Neg Hx    Rectal cancer Neg Hx    Stomach cancer Neg Hx    Social History   Socioeconomic History   Marital status: Married    Spouse name: Not on file   Number of children: 6   Years of  education: Not on file   Highest education level: High school graduate  Occupational History   Occupation: Retired    Fish farm manager: RETIRED  Tobacco Use   Smoking status: Former    Packs/day: 1.00    Years: 55.00    Total pack years: 55.00    Types: Cigarettes    Quit date: 03/20/2008    Years since quitting: 13.7   Smokeless tobacco: Never  Vaping Use   Vaping Use: Never used  Substance and Sexual Activity   Alcohol use: No    Comment: hx alcohol abuse -- quit in 1980's    Drug use: No   Sexual activity: Not on file  Other Topics Concern   Not on file  Social History Narrative   Married. 6 kids. Wife Joycelyn Schmid also a patient of Dr. Ronney Lion.       Retired.    Social Determinants of Health   Financial Resource Strain: Not on file  Food Insecurity: No Food Insecurity (12/26/2021)   Hunger Vital Sign    Worried About Running Out of Food in the Last Year: Never true    Ran Out of Food in the Last Year: Never true  Transportation Needs: No Transportation Needs (12/26/2021)   PRAPARE - Hydrologist (Medical): No    Lack of Transportation (Non-Medical): No  Physical Activity: Inactive (12/26/2021)   Exercise Vital Sign    Days of Exercise per Week: 0 days    Minutes of Exercise per Session: 0 min  Stress: Not on file  Social Connections: Socially Isolated (12/26/2021)   Social Connection and Isolation Panel [NHANES]    Frequency of Communication with Friends and Family: Once a week    Frequency of Social Gatherings with Friends and Family: Once a week    Attends Religious Services: Never    Building surveyor or Organizations: No    Attends Archivist Meetings: Never    Marital Status: Widowed    Tobacco Counseling Counseling given: Not Answered   Clinical Intake:  Pre-visit preparation completed: Yes  Pain : No/denies pain     BMI - recorded: 21.32 Nutritional Status: BMI of 19-24  Normal Nutritional Risks: None Diabetes: No  How often do you need to have someone help you when you read instructions, pamphlets, or other written materials from your doctor or pharmacy?: 1 - Never  Diabetic?no  Interpreter Needed?: No  Information entered by :: William Rakes, LPN   Activities of Daily Living    12/26/2021   11:06 AM  In your present state of health, do you have any difficulty performing the following activities:  Hearing? 1  Comment HOH  Vision? 0  Difficulty concentrating or making decisions? 0  Walking or climbing stairs? 0  Dressing or bathing? 0  Doing errands, shopping? 0  Preparing Food and eating ? N  Comment daughter helps when needed  Using the Toilet? N  In the past six months, have you accidently leaked urine? Y  Do you have problems with loss of bowel control? N  Managing your Medications? N  Managing your Finances? N  Housekeeping or managing your Housekeeping? N    Patient Care Team: Marin Olp, MD as PCP - General (Family Medicine) Waynetta Sandy, MD as Consulting Physician (Vascular Surgery) Hortencia Pilar, MD as Consulting Physician (Ophthalmology) Dermatology, [facility] as Consulting Physician  Indicate any recent [facility] you may have received from other than [facility] providers in the  past year (date may be approximate).     Assessment:   This is a routine wellness examination for William Mahoney.  Hearing/Vision screen Hearing Screening - Comments:: Pt stated difficulty with hearing  Vision Screening - Comments:: Pt follows up with provider on [facility]   Dietary issues and exercise  activities discussed: Current Exercise Habits: The patient does not participate in regular exercise at present   Goals Addressed   None    Depression Screen    12/26/2021   11:04 AM 12/26/2021   11:03 AM 09/25/2021   10:37 AM 04/17/2021    1:49 PM 12/21/2020   10:23 AM 11/06/2020   11:43 AM 10/05/2019    1:09 PM  PHQ 2/9 Scores  PHQ - 2 Score 4 0 0 1 2 0 0  PHQ- 9 Score 7    5  0    Fall Risk    12/26/2021   11:06 AM 09/25/2021   10:38 AM 04/17/2021    1:49 PM 12/21/2020   10:22 AM 11/06/2020   11:42 AM  Fall Risk   Falls in the past year? 0 0 0 0 0  Number falls in past yr: 0 0 0  0  Injury with Fall? 0 0 0  0  Risk for fall due to : Impaired vision;Impaired balance/gait No Fall Risks History of fall(s);No Fall Risks Impaired balance/gait No Fall Risks  Follow up Falls prevention discussed  Falls evaluation completed Education provided Falls evaluation completed    [place]:  Any stairs in or around the home? Yes  If so, are there any without handrails? No  Home free of loose throw rugs in walkways, pet beds, electrical cords, etc? Yes  Adequate lighting in your home to reduce risk of falls? Yes   ASSISTIVE DEVICES UTILIZED TO PREVENT FALLS:  Life alert? No  Use of a cane, walker or w/c? Yes  Grab bars in the bathroom? Yes  Shower chair or bench in shower? Yes  Elevated toilet seat or a handicapped toilet? No   TIMED UP AND GO:  Was the test performed? Yes .  Length of time to ambulate 10 feet: 20 sec.   Gait slow and steady without use of assistive device  Cognitive Function:        12/26/2021   11:08 AM 12/21/2020   10:32 AM  6CIT Screen  What Year? 0 points 0 points  What month? 0 points 0 points  What time? 0 points 0 points  Count back from 20 0 points 0 points  Months in reverse 0 points 0 points  Repeat phrase 0 points   Total Score 0 points     Immunizations Immunization History  Administered Date(s) Administered    Fluad Quad(high Dose 65+) 01/05/2019, 12/29/2019, 02/14/2021   Influenza Split 01/14/2011   Influenza, High Dose Seasonal PF 01/20/2013, 05/21/2017, 03/03/2018   Influenza,inj,Quad PF,6+ Mos 01/09/2014   PFIZER(Purple Top)SARS-COV-2 Vaccination 05/04/2019, 05/22/2019   Pneumococcal Conjugate-13 07/10/2014   Pneumococcal Polysaccharide-23 04/20/2006   Td 11/10/2012   Tdap 01/09/2018    TDAP status: Up to date  Flu Vaccine status: Up to date  Pneumococcal vaccine status: Up to date  Covid-19 vaccine status: Completed vaccines  Qualifies for Shingles Vaccine? Yes   Zostavax completed No   Shingrix Completed?: No.    Education has been provided regarding the importance of this vaccine. Patient has been advised to call insurance company to determine out of pocket expense if they have  not yet received this vaccine. Advised may also receive vaccine at local pharmacy or Health Dept. Verbalized acceptance and understanding.  Screening Tests Health Maintenance  Topic Date Due   Zoster Vaccines- Shingrix (1 of 2) 12/26/2021 (Originally 10/12/1944)   INFLUENZA VACCINE  07/19/2022 (Originally 11/18/2021)   TETANUS/TDAP  01/10/2028   Pneumonia Vaccine 41+ Years old  Completed   HPV VACCINES  Aged Out   COVID-19 Vaccine  Discontinued    Health Maintenance  There are no preventive care reminders to display for this patient.  Colorectal cancer screening: No longer required.   Additional Screening:    Vision Screening: Recommended annual ophthalmology exams for early detection of glaucoma and other disorders of the eye. Is the patient up to date with their annual eye exam?  Yes  Who is the provider or what is the name of the office in which the patient attends annual eye exams? [facility] provider  If pt is not established with a provider, would they like to be referred to a provider to establish care? No .   Dental Screening: Recommended annual dental exams for proper oral  hygiene  Community Resource Referral / Chronic Care Management: CRR required this visit?  No   CCM required this visit?  No      Plan:     I have personally reviewed and noted the following in the patient's chart:   Medical and social history Use of alcohol, tobacco or illicit drugs  Current medications and supplements including opioid prescriptions. Patient is not currently taking opioid prescriptions. Functional ability and status Nutritional status Physical activity Advanced directives List of other physicians Hospitalizations, surgeries, and ER visits in previous 12 months Vitals Screenings to include cognitive, depression, and falls Referrals and appointments  In addition, I have reviewed and discussed with patient certain preventive protocols, quality metrics, and best practice recommendations. A written personalized care plan for preventive services as well as general preventive health recommendations were provided to patient.     Willette Brace, LPN   05/26/6387   Nurse Notes: none

## 2022-01-12 ENCOUNTER — Ambulatory Visit: Payer: Medicare Other

## 2022-01-13 ENCOUNTER — Telehealth: Payer: Self-pay

## 2022-01-13 ENCOUNTER — Other Ambulatory Visit: Payer: Self-pay | Admitting: Family Medicine

## 2022-01-13 DIAGNOSIS — Z5181 Encounter for therapeutic drug level monitoring: Secondary | ICD-10-CM

## 2022-01-13 NOTE — Telephone Encounter (Signed)
Patient missed coumadin clinic appointment on 9/25. Called and spoke to patient. Pt states he forgot.  Rescheduled to 9/27 at [facility] coumadin clinic.

## 2022-01-14 ENCOUNTER — Encounter: Payer: Self-pay | Admitting: Internal Medicine

## 2022-01-14 ENCOUNTER — Ambulatory Visit (INDEPENDENT_AMBULATORY_CARE_PROVIDER_SITE_OTHER): Payer: Medicare Other

## 2022-01-14 ENCOUNTER — Ambulatory Visit (INDEPENDENT_AMBULATORY_CARE_PROVIDER_SITE_OTHER): Payer: Medicare Other | Admitting: Internal Medicine

## 2022-01-14 VITALS — BP 130/65 | HR 50 | Temp 97.4°F | Resp 12 | Ht 70.0 in | Wt 156.4 lb

## 2022-01-14 DIAGNOSIS — T17308A Unspecified foreign body in larynx causing other injury, initial encounter: Secondary | ICD-10-CM | POA: Diagnosis not present

## 2022-01-14 DIAGNOSIS — J029 Acute pharyngitis, unspecified: Secondary | ICD-10-CM | POA: Diagnosis not present

## 2022-01-14 DIAGNOSIS — R29818 Other symptoms and signs involving the nervous system: Secondary | ICD-10-CM | POA: Diagnosis not present

## 2022-01-14 DIAGNOSIS — H6592 Unspecified nonsuppurative otitis media, left ear: Secondary | ICD-10-CM | POA: Diagnosis not present

## 2022-01-14 DIAGNOSIS — Z7901 Long term (current) use of anticoagulants: Secondary | ICD-10-CM | POA: Diagnosis not present

## 2022-01-14 DIAGNOSIS — J392 Other diseases of pharynx: Secondary | ICD-10-CM

## 2022-01-14 LAB — POCT INR: INR: 2.1 (ref 2.0–3.0)

## 2022-01-14 LAB — POCT RAPID STREP A (OFFICE): Rapid Strep A Screen: NEGATIVE

## 2022-01-14 MED ORDER — DEBROX 6.5 % OT SOLN: 5.0000 [drp] | Freq: Two times a day (BID) | OTIC | 0 refills | Status: DC

## 2022-01-14 MED ORDER — AMOXICILLIN-POT CLAVULANATE 875-125 MG PO TABS: 1.0000 | ORAL_TABLET | Freq: Two times a day (BID) | ORAL | 0 refills | Status: DC

## 2022-01-14 MED ORDER — HYDROCORTISONE-ACETIC ACID 1-2 % OT SOLN: 4.0000 [drp] | Freq: Two times a day (BID) | OTIC | 0 refills | Status: DC

## 2022-01-14 NOTE — Progress Notes (Signed)
[place] [facility] [employer] [employer]:  [phone]   Routine Medical Office Visit  Patient:  William Mahoney      Age: 88 y.o.       Sex:  male  Date:   01/14/2022  PCP:    Marin Olp, [place] Provider: Loralee Pacas, MD  Assessment/Plan:   Righteous was seen today for sore throat and otalgia.  Focal neurological deficit - drooping of left throat and chokes/coughs with swallowing.  I explained ER is safest and he and daughter have capacity to decline this recommendation. -     MR BRAIN W WO CONTRAST; Future -     CT SOFT TISSUE NECK W CONTRAST; Future  Sore throat -     POCT rapid strep A test negative.  Doesn't seem infectious to me. -     CT SOFT TISSUE NECK W CONTRAST; Future   Other nonsuppurative otitis media of left ear, unspecified chronicity:  something is causing pain of left ear but the white film which is likely hydrogen peroxide chemical effect on wax prevents clear visualization. Advise stopping hydrogen peroxide and using debrox and other ear drops as wax dissolvers, and treat as infection with antibiotic presumptively for now.  -     Amoxicillin-Pot Clavulanate; Take 1 tablet by mouth 2 (two) times daily.  Dispense: 20 tablet; Refill: 0 -     Debrox; Place 5 drops into the left ear 2 (two) times daily. Must let it sit in ear using gravity lying down for about 5 minutes before letting it pour out  Dispense: 15 mL; Refill: 0 -     Hydrocortisone-Acetic Acid; Place 4 drops into the left ear 2 (two) times daily. Apply to affected ear. For ear canal irritation/otitis externa.  Dispense: 10 mL; Refill: 0 -     CT SOFT TISSUE NECK W CONTRAST; Future  Acute pharyngitis, unspecified etiology: I think sore throat and choking due to the abnormality of palate droop vs ? Mass in throat -     CT SOFT TISSUE NECK W CONTRAST; Future  Choking, initial encounter: it doesn't sound like he has aspirated significantly but the frequent coughing sounds like he is  choking on food with the anomaly in his throat. -     Ambulatory referral to Speech Therapy -     CT SOFT TISSUE NECK W CONTRAST; Future  Mass of throat: see picture, left throat palate is drooping vs ? Mass on left side of oropharynx. -     CT SOFT TISSUE NECK W CONTRAST; Future   Return in about 1 week (around 01/21/2022) for Dr. Yong Channel to re-eval response to meds and decide if MRI and CT neck still worth..    Patient is instructed to call or message via MyChart if he has any questions or concerns regarding our treatment plan.  No barriers to understanding were identified, perhaps slight hearing deficity We discussed Red Flag symptoms and signs in detail. He expressed understanding regarding what to do in case of urgent or emergency type symptoms.  He was advised to call the office or go to ER if his condition worsens. Additional information was provided in the AVS (see AVS) regarding the diagnosis/treatment plan for him to review Printout declined- will use mychart    Subjective:   William Mahoney is a 88 y.o. male with PMH significant for: Past Medical History:  Diagnosis Date   BPH (benign prostatic hypertrophy)    Chronic atrial fibrillation (  [facility])    cardiologist--   dr berry   COPD (chronic obstructive pulmonary disease) ([place])    Diverticulosis of colon    MODERATE LEFT SIDE   Epididymal cyst    w/ epididymalitis   Full dentures    Gout    Hand foot syndrome    secondary to chemotherapy (Xeloda)   cold hands/feet   Hearing loss    History of alcohol abuse    quit drinking in the mid 80's   History of cirrhosis of liver    alcoholic--  hx alcohol abuse -- quit drinking 1980's   History of gout    History of melanoma excision    2013--  left ear lobe and back of hand   History of pancreatitis    2008   History of rectal cancer oncologist-  dr Benay Spice--  no recurrence   dx Sept 2009--  Stage II (T3N0)  s/p  sigmoid colectomy & low anterior resection 04-18-2008  and  chemoradiation 2010   History of shingles 07/27/2010   History of squamous cell carcinoma excision    left lower leg   Hyperlipidemia    Hypertension    Loose stools    DUE TO ANTIBIOTICS   Macular degeneration    not sure which eye   OA (osteoarthritis)    RBBB (right bundle branch block)    Renal lesion    chronic-- left side   Sciatica of right side    Urge urinary incontinence    intermittant     He main concern for today's visit is: Chief Complaint  Patient presents with   Sore Throat    For 2 or 3 weeks with no fever.   Otalgia    Left ear.     Additional physician collected history: Ankle swollen for a few days on the left than the right He has aching pain in the left ear only that is only inside the ear a week Sore throat has been 2 or 3 weeks but it feels connected to the ear Positive for: dry cough Down low in throat Feels like a dry choking cough. Eating irritates/triggers it Bruise appearing 50c coin shaped lesion on neck Negative for: Fevers Lumps in neck Nasal drainage Hearing loss (by report, he seems to have hearing loss on exam)          Objective:  Physical Exam: BP 130/65 (BP Location: Right Arm, Patient Position: Sitting)   Pulse (!) 50   Temp (!) 97.4 F (36.3 C) (Temporal)   Resp 12   Ht '5\' 10"'$  (1.778 m)   Wt 156 lb 6.4 oz (70.9 kg)   SpO2 95%   BMI 22.44 kg/m   He  is a polite, friendly, and genuine person Constitutional: NAD, AAO, not ill-appearing  Neuro: alert, no focal deficit obvious, articulate speech Psych: normal mood, behavior, thought content   Problem specific physical exam findings:  See images which show throat with palatal drooping on left and white film that could not be cleared from TM and left EAC via irrigation.  No images are attached to the encounter or orders placed in the encounter.    Results:  Results for orders placed or performed in visit on 01/14/22  POCT rapid strep A  Result Value Ref Range    Rapid Strep A Screen Negative Negative  Results for orders placed or performed in visit on 01/14/22  POCT INR  Result Value Ref Range   INR 2.1 2.0 - 3.0  Recent Results (from the past 2160 hour(s))  POCT INR     Status: None   Collection Time: 12/01/21 12:00 AM  Result Value Ref Range   INR 2.0 2.0 - 3.0  POCT INR     Status: None   Collection Time: 01/14/22 12:00 AM  Result Value Ref Range   INR 2.1 2.0 - 3.0  POCT rapid strep A     Status: Normal   Collection Time: 01/14/22  4:51 PM  Result Value Ref Range   Rapid Strep A Screen Negative Negative         Left ear canal and drum     Drooping left palatal arch vs mass in left oropharynx - No focal neuro deficits otherwise on facial nerve testing.

## 2022-01-14 NOTE — Patient Instructions (Signed)
Continue 1 tablet daily except take 1 1/2 on Mondays, Wednesdays and Fridays.  Re-check in 6 wks.

## 2022-01-14 NOTE — Progress Notes (Signed)
INR 2.1. Continue 1 tablet daily except take 1 1/2 on Mondays, Wednesdays and Fridays.  Re-check in 6 wks.

## 2022-01-15 NOTE — Progress Notes (Signed)
NEUROLOGY FOLLOW UP OFFICE NOTE  William Mahoney [id number]  Assessment/Plan:   1  Peripheral neuropathy - unclear etiology.  A peripheral neuropathy alone wouldn't cause the skin discoloration, suggesting that there is something else going on contributing to the burning/pain discomfort.  Dermatology couldn't find an answer.  Blood work is negative for autoimmune/rheumatological etiology.  I don't think we need MRI of C-spine as this would not be coming from the cervical spine. 2  Soft palate asymmetry.  Unclear if represents hemipalatal weakness or left sided soft palate hyperplasia.  Workup from primary care in progress   Increase gabapentin to '100mg'$  in morning and '200mg'$  at bedtime.  Caution for drowsiness and/or dizziness.  We can further titrate dose in 1-2 weeks if needed. Will follow up on MRI of brain and CT neck. Otherwise, follow up 6 months.     Subjective:  William Mahoney is a 88 year old male with COPD, a fib, PAD, CKD St 3a, thrombocytopenia, and history of rectal cancer, melanoma, gout and remote history of alcoholism who follows up for neuropathy.  He is accompanied by his daughter who supplements history.  UPDATE: Labs:  negative ANA, negative RF, ACE 44, negative SSA/SSB antibodies, B6 7.5, SPEP/IFE negative for monoclonal gammopathy.  MRI of cervial spine was ordered to rule out spinal stenosis but never performed.  Started gabapentin '200mg'$  at bedtime.  No improvement.  It is an aching and sometimes burning.  Has not spread.  Feels some aching in the legs at night but no discoloration.    He reports sore throat and left ear pain.  Saw primary care yesterday.  Diagnosed with ear infection but was told that he had a stoke because half his palate didn't rise.  MRI of brain with and without contrast has been ordered.       HISTORY:  In January 2023, he started experiencing burning and tingling in the web between his 2nd and 3rd fingers of his left han.  It then involved the  same on the right hand..  It subsequently progressed to involve the entire hand and now up the mid-forearm bilaterally.  He has some discoloration of his hands but has become more red than usual.  No weakness.  No neck pain.  No involvement of the lower extremities.  Has seen dermatology.  Topical steroid creams ineffective.  Labs from March include B12 964, TSH 4.27, Hgb A1c 6.3, CBC with PLT 109 but otherwise unremarkable and CMP with GFR 52 but otherwise unremarkable.  PAST MEDICAL HISTORY: Past Medical History:  Diagnosis Date   BPH (benign prostatic hypertrophy)    Chronic atrial fibrillation [facility])    cardiologist--   dr berry   COPD (chronic obstructive pulmonary disease) ([place])    Diverticulosis of colon    MODERATE LEFT SIDE   Epididymal cyst    w/ epididymalitis   Full dentures    Gout    Hand foot syndrome    secondary to chemotherapy ([place])   cold hands/feet   Hearing loss    History of alcohol abuse    quit drinking in the mid 80's   History of cirrhosis of liver    alcoholic--  hx alcohol abuse -- quit drinking 1980's   History of gout    History of melanoma excision    2013--  left ear lobe and back of hand   History of pancreatitis    2008   History of rectal cancer oncologist-  dr Benay Spice--  no recurrence  dx Sept 2009--  Stage II (T3N0)  s/p  sigmoid colectomy & low anterior resection 04-18-2008  and chemoradiation 2010   History of shingles 07/27/2010   History of squamous cell carcinoma excision    left lower leg   Hyperlipidemia    Hypertension    Loose stools    DUE TO ANTIBIOTICS   Macular degeneration    not sure which eye   OA (osteoarthritis)    RBBB (right bundle branch block)    Renal lesion    chronic-- left side   Sciatica of right side    Urge urinary incontinence    intermittant    MEDICATIONS: Current Outpatient Medications on File Prior to Visit  Medication Sig Dispense Refill   acetic acid-hydrocortisone (VOSOL-HC) OTIC solution  Place 4 drops into the left ear 2 (two) times daily. Apply to affected ear. For ear canal irritation/otitis externa. 10 mL 0   allopurinol (ZYLOPRIM) 300 MG tablet TAKE ONE-HALF TABLET BY MOUTH IN THE EVENING 45 tablet 1   ALPRAZolam (XANAX) 0.25 MG tablet Take 1 tablet (0.25 mg total) by mouth 2 (two) times daily as needed for anxiety (do not drive for 8 hours after taking. have someone by your side with walking if you take this. do not take within 8 hours of ambien.). 20 tablet 0   amLODipine (NORVASC) 2.5 MG tablet TAKE 1 TABLET BY MOUTH EVERY DAY 90 tablet 1   amoxicillin-clavulanate (AUGMENTIN) 875-125 MG tablet Take 1 tablet by mouth 2 (two) times daily. 20 tablet 0   carbamide peroxide (DEBROX) 6.5 % OTIC solution Place 5 drops into the left ear 2 (two) times daily. Must let it sit in ear using gravity lying down for about 5 minutes before letting it pour out 15 mL 0   colchicine 0.6 MG tablet Take 2 tablets (1.2 mg) at the first sign of flare, followed by 0.6 mg after 1 hour if pain not resolved. Then take once daily after that until pain resolves. Continue allopurinol during gout flares. (Patient not taking: Reported on 12/26/2021) 180 tablet 2   fluocinonide (LIDEX) 0.05 % external solution      fluticasone (FLONASE) 50 MCG/ACT nasal spray Place 2 sprays into both nostrils daily. 48 g 3   furosemide (LASIX) 20 MG tablet TAKE 1 TABLET BY MOUTH EVERY DAY AS NEEDED 90 tablet 1   metoprolol tartrate (LOPRESSOR) 25 MG tablet TAKE 1/2 TABLET BY MOUTH TWICE A DAY 90 tablet 1   Multiple Vitamin (MULTIVITAMIN) tablet Take 1 tablet by mouth daily.     tamsulosin (FLOMAX) 0.4 MG CAPS capsule TAKE 1 CAPSULE BY MOUTH EVERY DAY 90 capsule 1   triamterene-hydrochlorothiazide (MAXZIDE-25) 37.5-25 MG tablet TAKE 1/2 TABLET BY MOUTH EVERY DAY 45 tablet 1   warfarin (COUMADIN) 2.5 MG tablet TAKE 1 TABLET DAILY EXCEPT TAKE 1 AND 1/2 TABLETS ON WED AND SAT OR TAKE AS DIRECTED BY CLINIC 120 tablet 1   zolpidem  (AMBIEN) 5 MG tablet Take 1 tablet (5 mg total) by mouth at bedtime as needed for sleep (dose increase. does not drive. family will be in home when he takes full dose. if any falls will stop). 30 tablet 5   No current facility-administered medications on file prior to visit.    ALLERGIES: No Known Allergies  FAMILY HISTORY: Family History  Problem Relation Age of Onset   Hypertension Mother    Lymphoma Father    Lymphoma Other    Stroke Other  1st degree relative   Colon cancer Neg Hx    Esophageal cancer Neg Hx    Rectal cancer Neg Hx    Stomach cancer Neg Hx       Objective:  Blood pressure 111/62, pulse 86, height '5\' 11"'$  (1.803 m), weight 154 lb 12.8 oz (70.2 kg), SpO2 100 %. General: No acute distress.  Patient appears well-groomed.   Head:  Normocephalic/atraumatic Eyes:  Fundi examined but not visualized Neck: supple, no paraspinal tenderness, full range of motion Heart:  Regular rate and rhythm Lungs:  Clear to auscultation bilaterally Back: No paraspinal tenderness Neurological Exam: alert and oriented to person, place, and time.  Speech fluent and not dysarthric, language intact.  CN II-XII intact. Bulk and tone normal, muscle strength 5/5 throughout.  Sensation to pinprick and vibration intact.  Deep tendon reflexes 2+ throughout, toes downgoing.  Finger to nose testing intact.  Gait normal, Romberg negative.   [name], DO  CC: [name], MD

## 2022-01-16 ENCOUNTER — Encounter: Payer: Self-pay | Admitting: Neurology

## 2022-01-16 ENCOUNTER — Ambulatory Visit (INDEPENDENT_AMBULATORY_CARE_PROVIDER_SITE_OTHER): Payer: Medicare Other | Admitting: Neurology

## 2022-01-16 VITALS — BP 111/62 | HR 86 | Ht 71.0 in | Wt 154.8 lb

## 2022-01-16 DIAGNOSIS — R202 Paresthesia of skin: Secondary | ICD-10-CM

## 2022-01-16 DIAGNOSIS — R200 Anesthesia of skin: Secondary | ICD-10-CM

## 2022-01-16 DIAGNOSIS — Q385 Congenital malformations of palate, not elsewhere classified: Secondary | ICD-10-CM | POA: Diagnosis not present

## 2022-01-16 MED ORDER — GABAPENTIN 100 MG PO CAPS: ORAL_CAPSULE | ORAL | 5 refills | Status: DC

## 2022-01-18 ENCOUNTER — Ambulatory Visit (HOSPITAL_BASED_OUTPATIENT_CLINIC_OR_DEPARTMENT_OTHER)
Admission: RE | Admit: 2022-01-18 | Discharge: 2022-01-18 | Disposition: A | Discharge status: Home / Self Care | Payer: Medicare Other | Source: Ambulatory Visit | Attending: Internal Medicine | Admitting: Internal Medicine

## 2022-01-18 DIAGNOSIS — J029 Acute pharyngitis, unspecified: Secondary | ICD-10-CM | POA: Diagnosis not present

## 2022-01-18 DIAGNOSIS — T17308A Unspecified foreign body in larynx causing other injury, initial encounter: Secondary | ICD-10-CM | POA: Diagnosis present

## 2022-01-18 DIAGNOSIS — J392 Other diseases of pharynx: Secondary | ICD-10-CM | POA: Diagnosis present

## 2022-01-18 DIAGNOSIS — R29818 Other symptoms and signs involving the nervous system: Secondary | ICD-10-CM

## 2022-01-18 DIAGNOSIS — H6592 Unspecified nonsuppurative otitis media, left ear: Secondary | ICD-10-CM

## 2022-01-18 LAB — POCT I-STAT CREATININE: Creatinine, Ser: 1.1 mg/dL (ref 0.61–1.24)

## 2022-01-18 MED ORDER — IOHEXOL 300 MG/ML  SOLN
100.0000 mL | Freq: Once | INTRAMUSCULAR | Status: AC | PRN
  Administered 2022-01-18: 75 mL via INTRAVENOUS

## 2022-01-20 ENCOUNTER — Encounter: Payer: Self-pay | Admitting: Family Medicine

## 2022-01-20 ENCOUNTER — Ambulatory Visit (INDEPENDENT_AMBULATORY_CARE_PROVIDER_SITE_OTHER): Payer: Medicare Other | Admitting: Family Medicine

## 2022-01-20 VITALS — BP 122/64 | HR 59 | Temp 97.9°F | Ht 71.0 in | Wt 152.2 lb

## 2022-01-20 DIAGNOSIS — I10 Essential (primary) hypertension: Secondary | ICD-10-CM

## 2022-01-20 DIAGNOSIS — R490 Dysphonia: Secondary | ICD-10-CM

## 2022-01-20 DIAGNOSIS — H9202 Otalgia, left ear: Secondary | ICD-10-CM

## 2022-01-20 DIAGNOSIS — I714 Abdominal aortic aneurysm, without rupture, unspecified: Secondary | ICD-10-CM

## 2022-01-20 DIAGNOSIS — R6889 Other general symptoms and signs: Secondary | ICD-10-CM

## 2022-01-20 DIAGNOSIS — I4891 Unspecified atrial fibrillation: Secondary | ICD-10-CM

## 2022-01-20 NOTE — Patient Instructions (Addendum)
Team urgent referral to ENT please  Wait on CT results  Also wait on mri results to rule out stroke  Can try blue emu cream  Recommended follow up: Return in about 1 month (around 02/20/2022) for followup or sooner if needed.Schedule b4 you leave.

## 2022-01-20 NOTE — Progress Notes (Signed)
Phone [phone] In person visit   Subjective:   William Mahoney is a 88 y.o. year old very pleasant male patient who presents for/with See problem oriented charting Chief Complaint  Patient presents with   follow up    Pt states he is here to f/u on visit from Dr. Randol Kern and scans that were performed.    Past Medical History-  Patient Active Problem List   Diagnosis Date Noted   AAA (abdominal aortic aneurysm) without rupture 11/20/2019    Priority: High   Edema 11/10/2016    Priority: High   Atrial fibrillation ([place]) 12/07/2008    Priority: High   Hyperglycemia 06/25/2021    Priority: Medium    PAD (peripheral artery disease) ([place]) 11/20/2019    Priority: Medium    Chronic kidney disease (CKD), stage III (moderate) ([place]) 02/15/2019    Priority: Medium    Aortic atherosclerosis ([place]) 01/05/2019    Priority: Medium    Thrombocytopenia ([place]) 11/10/2016    Priority: Medium    Insomnia 10/19/2014    Priority: Medium    Gout 01/09/2014    Priority: Medium    History of cancer of rectosigmoid junction 01/04/2008    Priority: Medium    Former smoker 06/23/2007    Priority: Medium    Hyperlipidemia, unspecified 02/09/2007    Priority: Medium    Essential hypertension 12/07/2006    Priority: Medium    COPD (chronic obstructive pulmonary disease) ([place]) 12/07/2006    Priority: Medium    BPH (benign prostatic hyperplasia) 12/07/2006    Priority: Medium    Encounter for therapeutic drug monitoring 08/27/2014    Priority: Low   Allergic rhinitis 08/15/2013    Priority: Low   Arthritis of lumbar spine 01/20/2013    Priority: Low   Inguinal hernia 03/21/2010    Priority: Low   BASAL CELL CARCINOMA SKIN LOWER LIMB INCL HIP 03/21/2010    Priority: Low   Actinic keratosis 12/07/2008    Priority: Low   TMJ (temporomandibular joint disorder) 02/09/2007    Priority: Low   URINARY INCONTINENCE 12/07/2006    Priority: Low   PANCREATITIS, HX OF 12/07/2006    Priority: Low    Porokeratosis 11/25/2021    Medications- reviewed and updated Current Outpatient Medications  Medication Sig Dispense Refill   acetic acid-hydrocortisone (VOSOL-HC) OTIC solution Place 4 drops into the left ear 2 (two) times daily. Apply to affected ear. For ear canal irritation/otitis externa. 10 mL 0   allopurinol (ZYLOPRIM) 300 MG tablet TAKE ONE-HALF TABLET BY MOUTH IN THE EVENING 45 tablet 1   ALPRAZolam (XANAX) 0.25 MG tablet Take 1 tablet (0.25 mg total) by mouth 2 (two) times daily as needed for anxiety (do not drive for 8 hours after taking. have someone by your side with walking if you take this. do not take within 8 hours of ambien.). 20 tablet 0   amLODipine (NORVASC) 2.5 MG tablet TAKE 1 TABLET BY MOUTH EVERY DAY 90 tablet 1   amoxicillin-clavulanate (AUGMENTIN) 875-125 MG tablet Take 1 tablet by mouth 2 (two) times daily. 20 tablet 0   carbamide peroxide (DEBROX) 6.5 % OTIC solution Place 5 drops into the left ear 2 (two) times daily. Must let it sit in ear using gravity lying down for about 5 minutes before letting it pour out 15 mL 0   colchicine 0.6 MG tablet Take 2 tablets (1.2 mg) at the first sign of flare, followed by 0.6 mg after 1 hour if pain not resolved.  Then take once daily after that until pain resolves. Continue allopurinol during gout flares. (Patient not taking: Reported on 12/26/2021) 180 tablet 2   fluocinonide (LIDEX) 0.05 % external solution      fluticasone (FLONASE) 50 MCG/ACT nasal spray Place 2 sprays into both nostrils daily. 48 g 3   furosemide (LASIX) 20 MG tablet TAKE 1 TABLET BY MOUTH EVERY DAY AS NEEDED 90 tablet 1   gabapentin (NEURONTIN) 100 MG capsule Take 1 capsule in morning and 2 capsules at night 90 capsule 5   metoprolol tartrate (LOPRESSOR) 25 MG tablet TAKE 1/2 TABLET BY MOUTH TWICE A DAY 90 tablet 1   Multiple Vitamin (MULTIVITAMIN) tablet Take 1 tablet by mouth daily.     tamsulosin (FLOMAX) 0.4 MG CAPS capsule TAKE 1 CAPSULE BY MOUTH EVERY  DAY 90 capsule 1   triamterene-hydrochlorothiazide (MAXZIDE-25) 37.5-25 MG tablet TAKE 1/2 TABLET BY MOUTH EVERY DAY 45 tablet 1   warfarin (COUMADIN) 2.5 MG tablet TAKE 1 TABLET DAILY EXCEPT TAKE 1 AND 1/2 TABLETS ON WED AND SAT OR TAKE AS DIRECTED BY CLINIC 120 tablet 1   zolpidem (AMBIEN) 5 MG tablet Take 1 tablet (5 mg total) by mouth at bedtime as needed for sleep (dose increase. does not drive. family will be in home when he takes full dose. if any falls will stop). 30 tablet 5   No current facility-administered medications for this visit.     Objective:  BP 122/64   Pulse (!) 59   Temp 97.9 F (36.6 C)   Ht '5\' 11"'$  (1.803 m)   Wt 152 lb 3.2 oz (69 kg)   SpO2 98%   BMI 21.23 kg/m  Gen: NAD, resting comfortably Left tympanic membrane now normal, drooping of left palatal arch but not as severe as previously photographed-small triangle coming down CV: RRR no murmurs rubs or gallops Lungs: CTAB no crackles, wheeze, rhonchi Abdomen: soft/nontender/nondistended/normal bowel sounds. No rebound or guarding.  Ext: 1+ edema Skin: warm, dry     Assessment and Plan   #Sore throat/left ear pain S: Patient reported sore throat for 2 to 3 weeks with no fever as well as left ear pain at visit on 01/14/2022 with Dr. Joni Fears felt like sore throat was connected to his left ear discomfort.  Also reported dry cough/choking like cough and also had a bruise appearing 50 cent cone shaped lesion on the neck.  Patient was noted to have a drooping left palatal arch versus mass in the left oropharynx as well as multiple white appearing lesions on the left ear canal.  Strep test was negative.  Dr. Randol Kern was concerned for possible otitis medi and treated with Augmentin for 10 days as well as treatment for otitis externa with hydrocortisone-acetic acid and patient was advised to stop using hydroperoxide.  For the drooping portion of pharynx both MRI of the head was ordered to rule out stroke as  well as CT of his soft tissue of the neck (currently pending an MRI scheduled)  Today patient reports also has developed hoarseness for the last 5 days as well as some right ear pain and only mild improvement in the left ear pain. A/P: Sore throat/left ear pain and palatal arch drooping of unclear origin-pending CT soft tissue of the neck as well as MRI of the brain to rule out stroke - We opted to refer to ENT as well with stat referral - Compare to images from Dr. Dennard Nip evaluation palatal drooping appears somewhat improved  #Chronic  hand irritation/pain S: Concern for neurology for peripheral neuropathy of unclear cause on 1 hand but on the other hand that would not cause the red skin discoloration over the fingers.  Prior dermatology evaluation without cause.  Dr. Tomi Likens with neurology did recommend following through with MRI of the head.  Blood work as not showing any clear autoimmune or rheumatological condition. A/P: Extensive evaluation without clear cause-per patient and daughter's history sounds like this started after applying a cream to his now deceased wife without gloves or using inappropriate gloves.  Has also tried multiple topicals including strawberry, Eucerin, neuropathy cream, steroid cream.  Has not tried blue emu cream and is going to try that after discussion today. -Gabapentin was recently increased to 100 mg and patient may trial this-did not have benefit with 100 mg in the past   #aortic aneurysm-he is with aortic aneurysm and family wants to go ahead and update annual check-this was ordered today.  Blood pressure well controlled for aortic aneurysm history-continue to monitor  #Insomnia-Ambien 5 mg issues with pharmacy- went back up to full tablet with poor sleep.   #Chronic edema-patient mostly using Lasix every day as well as Maxide 25 mg-he asks about taking more Lasix and I discouraged this.  Honestly if tolerates and without leg pain compression stockings may be the  most helpful (but with PAD would need to be very cautious and if any discomfort stop)-suspect more venous insufficiency-amlodipine not ideal but helping control blood pressure  # Atrial fibrillation-follows with Dr. Gwenlyn Found of cardiology S: Rate controlled with metoprolol 25 mg-only takes 12.5 mg/day per Dr. Gwenlyn Found Anticoagulated with Coumadin-follows in our clinic A/P: Appropriately anticoagulated and rate controlled-continue current medication    #hypertension S: medication: Amlodipine 2.5 mg, triamterene hydrochlorothiazide 37.5-25 mg, Lasix 20 mg as needed for edema, metoprolol 25 mg-only takes 12.5 mg/day per Dr. Gwenlyn Found   BP Readings from Last 3 Encounters:  01/20/22 122/64  01/16/22 111/62  01/14/22 130/65   A/P:  Controlled. Continue current medications.    Recommended follow up: Return in about 1 month (around 02/20/2022) for followup or sooner if needed.Schedule b4 you leave. Future Appointments  Date Time Provider [place]  01/22/2022  2:30 PM [facility]  02/20/2022  3:00 PM Marin Olp, MD [facility]  02/25/2022  9:00 AM [facility]MADIN CLINIC [facility]  03/02/2022  3:45 PM Gardiner Barefoot, DPM [facility]  07/20/2022  3:30 PM Pieter Partridge, DO [facility] None  01/01/2023  3:00 PM [facility] HEALTH COACH [facility]    Lab/Order associations:   ICD-10-CM   1. Hoarseness  R49.0 Ambulatory referral to ENT    2. Left ear pain  H92.02 Ambulatory referral to ENT    3. Abnormal ear, nose, and throat evaluation  R68.89 Ambulatory referral to ENT    4. Abdominal aortic aneurysm (AAA) without rupture, unspecified part ([place])  I71.40 VAS [place] AAA DUPLEX    5. Atrial fibrillation, unspecified type ([place])  I48.91     6. Essential hypertension  I10       Return precautions advised.  [name], MD

## 2022-01-22 ENCOUNTER — Ambulatory Visit (HOSPITAL_BASED_OUTPATIENT_CLINIC_OR_DEPARTMENT_OTHER)
Admission: RE | Admit: 2022-01-22 | Discharge: 2022-01-22 | Disposition: A | Discharge status: Home / Self Care | Payer: Medicare Other | Source: Ambulatory Visit | Attending: Internal Medicine | Admitting: Internal Medicine

## 2022-01-22 DIAGNOSIS — R29818 Other symptoms and signs involving the nervous system: Secondary | ICD-10-CM | POA: Diagnosis not present

## 2022-01-22 MED ORDER — GADOPICLENOL 0.5 MMOL/ML IV SOLN
6.9000 mL | Freq: Once | INTRAVENOUS | Status: AC | PRN
  Administered 2022-01-22: 6.9 mL via INTRAVENOUS
  Filled 2022-01-22: qty 7.5

## 2022-01-22 NOTE — Progress Notes (Signed)
I reviewed this CAT scan and really pleased it shows that the asymmetric fullness in the left throat is not really looking like it is due to a mass but probably something that is affecting the nerve in that area although it is possibly still a stroke so we will want that MRI brain.  We should consider going ahead and getting ear nose and throat specialist and/or neurology involvement until the MRI shows whether it might be due to a stroke but the CAT scan does recommend direct visualization .  There is something called a dedicated skull base cranial nerve MRI that can be done instead of the standard MRI brain it looks of the nerves as they come out of the brain to the throat.  Would probably need to be ordered separately from the MRI brain I thought we should just wait and see what the MRI brain shows because if it shows stroke we really do not need any special MRI for peripheral cranial nerves

## 2022-01-23 ENCOUNTER — Telehealth: Payer: Self-pay | Admitting: Family Medicine

## 2022-01-23 NOTE — Telephone Encounter (Signed)
Pt states: -He has had a lot of scans this week, lots of results being received. -He is confused about why he is being scanned for cardiovascular.    Pt requests: -Call back from PCP team

## 2022-01-26 ENCOUNTER — Ambulatory Visit: Payer: Medicare Other | Admitting: Neurology

## 2022-01-27 NOTE — Telephone Encounter (Signed)
Called and spoke with pt and he states he has everything squared away.

## 2022-01-27 NOTE — Telephone Encounter (Signed)
He and daughter specifically requested aneurysm be rechecked at last visit and that is the reason it was ordered  I am glad to see he is scheduled with ear nose and throat on the 25th-very important to keep that appointment

## 2022-02-02 ENCOUNTER — Ambulatory Visit (HOSPITAL_COMMUNITY): Admission: RE | Admit: 2022-02-02 | Payer: Medicare Other | Source: Ambulatory Visit

## 2022-02-06 ENCOUNTER — Other Ambulatory Visit: Payer: Self-pay | Admitting: Family Medicine

## 2022-02-12 ENCOUNTER — Ambulatory Visit (HOSPITAL_COMMUNITY)
Admission: RE | Admit: 2022-02-12 | Discharge: 2022-02-12 | Disposition: A | Discharge status: Home / Self Care | Payer: Medicare Other | Source: Ambulatory Visit | Attending: Family Medicine | Admitting: Family Medicine

## 2022-02-12 DIAGNOSIS — I714 Abdominal aortic aneurysm, without rupture, unspecified: Secondary | ICD-10-CM | POA: Diagnosis present

## 2022-02-12 DIAGNOSIS — I7143 Infrarenal abdominal aortic aneurysm, without rupture: Secondary | ICD-10-CM

## 2022-02-20 ENCOUNTER — Telehealth: Payer: Self-pay | Admitting: Family Medicine

## 2022-02-20 ENCOUNTER — Ambulatory Visit: Payer: Medicare Other | Admitting: Family Medicine

## 2022-02-20 ENCOUNTER — Encounter: Payer: Self-pay | Admitting: Family Medicine

## 2022-02-20 VITALS — BP 130/72 | HR 67 | Temp 97.3°F | Ht 71.0 in | Wt 158.0 lb

## 2022-02-20 DIAGNOSIS — I10 Essential (primary) hypertension: Secondary | ICD-10-CM | POA: Diagnosis not present

## 2022-02-20 DIAGNOSIS — I7300 Raynaud's syndrome without gangrene: Secondary | ICD-10-CM

## 2022-02-20 DIAGNOSIS — I4891 Unspecified atrial fibrillation: Secondary | ICD-10-CM

## 2022-02-20 DIAGNOSIS — R21 Rash and other nonspecific skin eruption: Secondary | ICD-10-CM

## 2022-02-20 DIAGNOSIS — D696 Thrombocytopenia, unspecified: Secondary | ICD-10-CM

## 2022-02-20 NOTE — Telephone Encounter (Signed)
Caller States: -pt was seen by PCP today 02/20/22 -pt was instructed to go back to the Dermatology.  -Dermatologist has retired.   Caller requests: -new referral for dermatology.

## 2022-02-20 NOTE — Progress Notes (Signed)
Phone [phone] In person visit   Subjective:   William Mahoney is a 88 y.o. year old very pleasant male patient who presents for/with See problem oriented charting Chief Complaint  Patient presents with   Follow-up    Pt did see ENT and has been on antibiotics and they are making him sick.   Hypertension   nails    Pt noticed nails are starting to come off.   left hip pain   blotches on skin   Past Medical History-  Patient Active Problem List   Diagnosis Date Noted   AAA (abdominal aortic aneurysm) without rupture 11/20/2019    Priority: High   Edema 11/10/2016    Priority: High   Atrial fibrillation ([place]) 12/07/2008    Priority: High   Hyperglycemia 06/25/2021    Priority: Medium    PAD (peripheral artery disease) ([place]) 11/20/2019    Priority: Medium    Chronic kidney disease (CKD), stage III (moderate) ([place]) 02/15/2019    Priority: Medium    Aortic atherosclerosis ([place]) 01/05/2019    Priority: Medium    Thrombocytopenia ([place]) 11/10/2016    Priority: Medium    Insomnia 10/19/2014    Priority: Medium    Gout 01/09/2014    Priority: Medium    History of cancer of rectosigmoid junction 01/04/2008    Priority: Medium    Former smoker 06/23/2007    Priority: Medium    Hyperlipidemia, unspecified 02/09/2007    Priority: Medium    Essential hypertension 12/07/2006    Priority: Medium    COPD (chronic obstructive pulmonary disease) ([place]) 12/07/2006    Priority: Medium    BPH (benign prostatic hyperplasia) 12/07/2006    Priority: Medium    Encounter for therapeutic drug monitoring 08/27/2014    Priority: Low   Allergic rhinitis 08/15/2013    Priority: Low   Arthritis of lumbar spine 01/20/2013    Priority: Low   Inguinal hernia 03/21/2010    Priority: Low   BASAL CELL CARCINOMA SKIN LOWER LIMB INCL HIP 03/21/2010    Priority: Low   Actinic keratosis 12/07/2008    Priority: Low   TMJ (temporomandibular joint disorder) 02/09/2007    Priority: Low   URINARY  INCONTINENCE 12/07/2006    Priority: Low   PANCREATITIS, HX OF 12/07/2006    Priority: Low   Porokeratosis 11/25/2021    Medications- reviewed and updated Current Outpatient Medications  Medication Sig Dispense Refill   acetic acid-hydrocortisone (VOSOL-HC) OTIC solution Place 4 drops into the left ear 2 (two) times daily. Apply to affected ear. For ear canal irritation/otitis externa. 10 mL 0   allopurinol (ZYLOPRIM) 300 MG tablet TAKE ONE-HALF TABLET BY MOUTH IN THE EVENING 45 tablet 1   ALPRAZolam (XANAX) 0.25 MG tablet Take 1 tablet (0.25 mg total) by mouth 2 (two) times daily as needed for anxiety (do not drive for 8 hours after taking. have someone by your side with walking if you take this. do not take within 8 hours of ambien.). 20 tablet 0   amLODipine (NORVASC) 2.5 MG tablet TAKE 1 TABLET BY MOUTH EVERY DAY 90 tablet 1   amoxicillin-clavulanate (AUGMENTIN) 875-125 MG tablet Take 1 tablet by mouth 2 (two) times daily. 20 tablet 0   carbamide peroxide (DEBROX) 6.5 % OTIC solution Place 5 drops into the left ear 2 (two) times daily. Must let it sit in ear using gravity lying down for about 5 minutes before letting it pour out 15 mL 0   colchicine 0.6 MG  tablet Take 2 tablets (1.2 mg) at the first sign of flare, followed by 0.6 mg after 1 hour if pain not resolved. Then take once daily after that until pain resolves. Continue allopurinol during gout flares. 180 tablet 2   fluocinonide (LIDEX) 0.05 % external solution      fluticasone (FLONASE) 50 MCG/ACT nasal spray Place 2 sprays into both nostrils daily. 48 g 3   furosemide (LASIX) 20 MG tablet TAKE 1 TABLET BY MOUTH EVERY DAY AS NEEDED 90 tablet 1   gabapentin (NEURONTIN) 100 MG capsule Take 1 capsule in morning and 2 capsules at night 90 capsule 5   metoprolol tartrate (LOPRESSOR) 25 MG tablet TAKE 1/2 TABLET BY MOUTH TWICE A DAY 90 tablet 1   Multiple Vitamin (MULTIVITAMIN) tablet Take 1 tablet by mouth daily.     tamsulosin  (FLOMAX) 0.4 MG CAPS capsule TAKE 1 CAPSULE BY MOUTH EVERY DAY 90 capsule 1   triamterene-hydrochlorothiazide (MAXZIDE-25) 37.5-25 MG tablet TAKE 1/2 TABLET BY MOUTH EVERY DAY 45 tablet 1   warfarin (COUMADIN) 2.5 MG tablet TAKE 1 TABLET DAILY EXCEPT TAKE 1 AND 1/2 TABLETS ON WED AND SAT OR TAKE AS DIRECTED BY CLINIC 120 tablet 1   zolpidem (AMBIEN) 5 MG tablet Take 1 tablet (5 mg total) by mouth at bedtime as needed for sleep (dose increase. does not drive. family will be in home when he takes full dose. if any falls will stop). 30 tablet 5   No current facility-administered medications for this visit.     Objective:  BP 130/72   Pulse 67   Temp (!) 97.3 F (36.3 C)   Ht '5\' 11"'$  (1.803 m)   Wt 158 lb (71.7 kg)   SpO2 97%   BMI 22.04 kg/m  Gen: NAD, resting comfortably CV: RRR no murmurs rubs or gallops Lungs: CTAB no crackles, wheeze, rhonchi Abdomen: soft/nontender/nondistended/normal bowel sounds. No rebound or guarding.  Ext: 1+ edema left > right Skin: warm, dry, nonblanching almost appears bruised areas over arms, legs, upper chest, neck     Assessment and Plan   #Soft palate edema-patient was seen by [facility] ENT 02/11/2022-he was seen for ongoing bilateral ear pain as well as droop of the left palate.  CT of the neck showed asymmetry of the palate, MRI of the brain without obvious stroke because this.  He had clinically improved by time of visit-they thought it was potentially an infectious process that had significantly improved but not completely resolved (prior Augmentin) and they placed him on clindamycin 300 mg 3 times a day for 14 days and [place] steroids -Developed upset stomach and bad acid/GERD issues-ENT recommended avoiding taking medicine on empty stomach and to take probiotic daily.  Zofran was also sent in-no alternate therapy was suggested- they wanted him to complete therapy - he states ear pain is gone- still getting some scratchy throat - he feels side  effects are improving and hasn't taken zofran so will try to complete course and try medicine -Daughter would like for him to stop the medication but I suggested remaining on this unless discussed with ENT again-I will not formally go against the recommendations  #bruising like appearance- within a month without trauma  #has had issues with red hands that he has seen dermatology for with no clear cause and Dr. Tomi Likens in case neurological- more recently has noted red splotches over the skin (possible purpura) and also reports now he has noted worsening when hands are cold and fingers turn  a blackish sensation- warms them up and feels better.  -Wonder about Raynaud's - Your symptoms also makes me wonder about blood flow-order arterial duplex - Recommended dermatology visit for red splotches on skin but also check CBC, CMP, pathology smear review particular with thrombocytopenia Lab Results  Component Value Date   WBC 7.7 09/30/2021   HGB 13.3 09/30/2021   HCT 40.2 09/30/2021   MCV 91.7 09/30/2021   PLT 139.0 (L) 09/30/2021  -Recommended follow-up with me once above information is obtained to consider next steps.  Did briefly consider increasing amlodipine but already deals with some edema -Wonder if he would benefit from biopsy of newer skin lesions with dermatology  # Atrial fibrillation-follows with Dr. Alvester Chou of cardiology S: Rate controlled with metoprolol 25 mg-only takes 12.5 mg/day per Dr. Gwenlyn Found Anticoagulated with Coumadin-follows in our clinic A/P: Appropriately anticoagulated and rate controlled-continue current medication  #Abdominal aortic aneurysm-stable most recent check in October-continue to monitor and monitor blood pressure   #hypertension S: medication: Amlodipine 2.5 mg, triamterene hydrochlorothiazide 37.5-25 mg, Lasix 20 mg as needed for edema, metoprolol 25 mg-only takes 12.5 mg/day per Dr. Gwenlyn Found BP Readings from Last 3 Encounters:  02/20/22 130/72  01/20/22 122/64   01/16/22 111/62  A/P:  Controlled. Continue current medications.    #Mild thrombocytopenia-ongoing chronic issue but other cell lines normal-continue to monitor at least every 6 months-with skin changes also add pathology smear review as above and consider hematology consult  Recommended follow up: Return in about 2 months (around 04/22/2022) for followup or sooner if needed.Schedule b4 you leave. Future Appointments  Date Time Provider [place]  02/25/2022  9:00 AM [facility]  03/02/2022  3:45 PM Gardiner Barefoot, DPM [facility]  07/20/2022  3:30 PM Pieter Partridge, DO [facility] None  01/01/2023  3:00 PM [facility] HEALTH COACH [facility]    Lab/Order associations:   ICD-10-CM   1. Raynaud's phenomenon without gangrene  I73.00 VAS [place] UPPER EXTREMITY ARTERIAL DUPLEX    2. Thrombocytopenia ([place])  D69.6 CBC with Differential/Platelet    Comprehensive metabolic panel    Pathologist smear review    Pathologist smear review    Comprehensive metabolic panel    CBC with Differential/Platelet    CANCELED: CBC with Differential/Platelet    CANCELED: Comprehensive metabolic panel    3. Essential hypertension  I10 CBC with Differential/Platelet    Comprehensive metabolic panel    Comprehensive metabolic panel    CBC with Differential/Platelet    CANCELED: CBC with Differential/Platelet    CANCELED: Comprehensive metabolic panel    4. Atrial fibrillation, unspecified type ([place])  I48.91       No orders of the defined types were placed in this encounter.   Return precautions advised.  [name], MD

## 2022-02-20 NOTE — Patient Instructions (Addendum)
We will call you within two weeks about your referral for arterial imaging of the arms. If you do not hear within 2 weeks, give [place] a call.   Seeing your dermatologist is reasonable- even seeing if they think biopsy of one of newer spots is reasonable  Please stop by lab before you go If you have mychart- we will send your results within 3 business days of [place] receiving them.  If you do not have mychart- we will call you about results within 5 business days of [place] receiving them.  *please also note that you will see labs on mychart as soon as they post. I will later go in and write notes on them- will say "notes from Dr. Yong Channel"   Recommended follow up: Return in about 2 months (around 04/22/2022) for followup or sooner if needed.Schedule b4 you leave. -goal is to see dermatology, have bloodwork back, and have areterial test before we follow up so we can determine next steps

## 2022-02-23 ENCOUNTER — Telehealth: Payer: Self-pay

## 2022-02-23 ENCOUNTER — Other Ambulatory Visit: Payer: Self-pay

## 2022-02-23 ENCOUNTER — Telehealth: Payer: Self-pay | Admitting: Family Medicine

## 2022-02-23 DIAGNOSIS — I7300 Raynaud's syndrome without gangrene: Secondary | ICD-10-CM

## 2022-02-23 LAB — CBC WITH DIFFERENTIAL/PLATELET
Absolute Monocytes: 896 cells/uL (ref 200–950)
Basophils Absolute: 42 cells/uL (ref 0–200)
Basophils Relative: 0.6 %
Eosinophils Absolute: 161 cells/uL (ref 15–500)
Eosinophils Relative: 2.3 %
HCT: 42 % (ref 38.5–50.0)
Hemoglobin: 14.4 g/dL (ref 13.2–17.1)
Lymphs Abs: 1589 cells/uL (ref 850–3900)
MCH: 30.4 pg (ref 27.0–33.0)
MCHC: 34.3 g/dL (ref 32.0–36.0)
MCV: 88.8 fL (ref 80.0–100.0)
MPV: 10.4 fL (ref 7.5–12.5)
Monocytes Relative: 12.8 %
Neutro Abs: 4312 cells/uL (ref 1500–7800)
Neutrophils Relative %: 61.6 %
Platelets: 134 10*3/uL — ABNORMAL LOW (ref 140–400)
RBC: 4.73 10*6/uL (ref 4.20–5.80)
RDW: 13.4 % (ref 11.0–15.0)
Total Lymphocyte: 22.7 %
WBC: 7 10*3/uL (ref 3.8–10.8)

## 2022-02-23 LAB — COMPREHENSIVE METABOLIC PANEL
AG Ratio: 1.7 (calc) (ref 1.0–2.5)
ALT: 12 U/L (ref 9–46)
AST: 21 U/L (ref 10–35)
Albumin: 4.1 g/dL (ref 3.6–5.1)
Alkaline phosphatase (APISO): 71 U/L (ref 35–144)
BUN: 23 mg/dL (ref 7–25)
CO2: 28 mmol/L (ref 20–32)
Calcium: 9.1 mg/dL (ref 8.6–10.3)
Chloride: 100 mmol/L (ref 98–110)
Creat: 1.17 mg/dL (ref 0.70–1.22)
Globulin: 2.4 g/dL (calc) (ref 1.9–3.7)
Glucose, Bld: 84 mg/dL (ref 65–99)
Potassium: 4.4 mmol/L (ref 3.5–5.3)
Sodium: 138 mmol/L (ref 135–146)
Total Bilirubin: 0.7 mg/dL (ref 0.2–1.2)
Total Protein: 6.5 g/dL (ref 6.1–8.1)

## 2022-02-23 LAB — PATHOLOGIST SMEAR REVIEW

## 2022-02-23 NOTE — Telephone Encounter (Signed)
Called and spoke with patient's daughter. Appointment rescheduled to 02/25/22 at 4:15.

## 2022-02-23 NOTE — Telephone Encounter (Signed)
Referral has been placed. 

## 2022-02-23 NOTE — Addendum Note (Signed)
Addended by: Clyde Lundborg A on: 02/23/2022 02:35 PM   Modules accepted: Orders

## 2022-02-23 NOTE — Telephone Encounter (Signed)
Order has been fixed.

## 2022-02-23 NOTE — Telephone Encounter (Signed)
I received this message from [facility] at [facility]: Belton Peplinski Male, 88 y.o., 05-18-1925 [phone] MRN: [id number]    MD ordered incorrect study // wrong DX for this patient  After we had a chance to review, he stated the Dx is right but the wrong study was ordered. Can you please have Dr. Yong Channel take a look at the [place] study and change it to the correct one   Thank you, Lattie Haw

## 2022-02-23 NOTE — Telephone Encounter (Signed)
Caller States: -She did not receive a call to schedule 02/25/22 appt for pt. -pt cannot make the appt.  -Needs appointment in the afternoon, after 3:00 pm.   Caller Requests: -Return call to rescheduled.   02/25/22 appointment cancelled.

## 2022-02-25 ENCOUNTER — Ambulatory Visit (INDEPENDENT_AMBULATORY_CARE_PROVIDER_SITE_OTHER): Payer: Medicare Other

## 2022-02-25 ENCOUNTER — Ambulatory Visit: Payer: Medicare Other

## 2022-02-25 DIAGNOSIS — Z7901 Long term (current) use of anticoagulants: Secondary | ICD-10-CM

## 2022-02-25 LAB — POCT INR: INR: 3 (ref 2.0–3.0)

## 2022-02-25 NOTE — Patient Instructions (Addendum)
Continue 1 tablet daily except take 1 1/2 tablets on Mondays, Wednesdays and Fridays.  Recheck on 04/29/21 at 4:15 at [facility].

## 2022-02-25 NOTE — Progress Notes (Signed)
Continue 1 tablet daily except take 1 1/2 tablets on Mondays, Wednesdays and Fridays.  Recheck in 9 weeks per patient request. [place] most of [place] and the first week of January.

## 2022-02-26 ENCOUNTER — Ambulatory Visit: Payer: Medicare Other | Admitting: Family Medicine

## 2022-02-26 ENCOUNTER — Ambulatory Visit (HOSPITAL_COMMUNITY)
Admission: RE | Admit: 2022-02-26 | Discharge: 2022-02-26 | Disposition: A | Discharge status: Home / Self Care | Payer: Medicare Other | Source: Ambulatory Visit | Attending: Family Medicine | Admitting: Family Medicine

## 2022-02-26 ENCOUNTER — Encounter: Payer: Self-pay | Admitting: Family Medicine

## 2022-02-26 VITALS — BP 120/70 | HR 72 | Temp 97.3°F | Ht 71.0 in | Wt 152.2 lb

## 2022-02-26 DIAGNOSIS — I10 Essential (primary) hypertension: Secondary | ICD-10-CM

## 2022-02-26 DIAGNOSIS — M5410 Radiculopathy, site unspecified: Secondary | ICD-10-CM | POA: Diagnosis not present

## 2022-02-26 DIAGNOSIS — I7300 Raynaud's syndrome without gangrene: Secondary | ICD-10-CM | POA: Diagnosis present

## 2022-02-26 DIAGNOSIS — I4891 Unspecified atrial fibrillation: Secondary | ICD-10-CM

## 2022-02-26 MED ORDER — PREDNISONE 20 MG PO TABS: ORAL_TABLET | ORAL | 0 refills | Status: DC

## 2022-02-26 NOTE — Patient Instructions (Addendum)
Address for scan: [address], [place].  We discussed possible physical therapy referral- I think this could be an irritated nerve coming down from the back given pain into the leg. Considered prednisone trial- you opted in- try first thing in the morning for 7 days - can refer to physical therapy or sports medicine if not improving- let me know if symptoms worsens  I am hopeful for answers with test today and or dermatology for your hands  Also hopeful things are ok from ENT perspective off of the antibiotic when you see them  Recommended follow up: Return in about 5 weeks (around 04/02/2022) for followup or sooner if needed.Schedule b4 you leave.

## 2022-02-26 NOTE — Progress Notes (Signed)
Phone [phone] In person visit   Subjective:   William Mahoney is a 88 y.o. year old very pleasant male patient who presents for/with See problem oriented charting Chief Complaint  Patient presents with   Leg Pain    Pt c/o left leg pain that started in the hip and goes all the way down to his calf and no pain in right leg.   Past Medical History-  Patient Active Problem List   Diagnosis Date Noted   AAA (abdominal aortic aneurysm) without rupture 11/20/2019    Priority: High   Edema 11/10/2016    Priority: High   Atrial fibrillation ([place]) 12/07/2008    Priority: High   Hyperglycemia 06/25/2021    Priority: Medium    PAD (peripheral artery disease) ([place]) 11/20/2019    Priority: Medium    Chronic kidney disease (CKD), stage III (moderate) ([place]) 02/15/2019    Priority: Medium    Aortic atherosclerosis ([place]) 01/05/2019    Priority: Medium    Thrombocytopenia ([place]) 11/10/2016    Priority: Medium    Insomnia 10/19/2014    Priority: Medium    Gout 01/09/2014    Priority: Medium    History of cancer of rectosigmoid junction 01/04/2008    Priority: Medium    Former smoker 06/23/2007    Priority: Medium    Hyperlipidemia, unspecified 02/09/2007    Priority: Medium    Essential hypertension 12/07/2006    Priority: Medium    COPD (chronic obstructive pulmonary disease) ([place]) 12/07/2006    Priority: Medium    BPH (benign prostatic hyperplasia) 12/07/2006    Priority: Medium    Encounter for therapeutic drug monitoring 08/27/2014    Priority: Low   Allergic rhinitis 08/15/2013    Priority: Low   Arthritis of lumbar spine 01/20/2013    Priority: Low   Inguinal hernia 03/21/2010    Priority: Low   BASAL CELL CARCINOMA SKIN LOWER LIMB INCL HIP 03/21/2010    Priority: Low   Actinic keratosis 12/07/2008    Priority: Low   TMJ (temporomandibular joint disorder) 02/09/2007    Priority: Low   URINARY INCONTINENCE 12/07/2006    Priority: Low   PANCREATITIS, HX OF  12/07/2006    Priority: Low   Porokeratosis 11/25/2021    Medications- reviewed and updated Current Outpatient Medications  Medication Sig Dispense Refill   acetic acid-hydrocortisone (VOSOL-HC) OTIC solution Place 4 drops into the left ear 2 (two) times daily. Apply to affected ear. For ear canal irritation/otitis externa. 10 mL 0   allopurinol (ZYLOPRIM) 300 MG tablet TAKE ONE-HALF TABLET BY MOUTH IN THE EVENING 45 tablet 1   ALPRAZolam (XANAX) 0.25 MG tablet Take 1 tablet (0.25 mg total) by mouth 2 (two) times daily as needed for anxiety (do not drive for 8 hours after taking. have someone by your side with walking if you take this. do not take within 8 hours of ambien.). 20 tablet 0   amLODipine (NORVASC) 2.5 MG tablet TAKE 1 TABLET BY MOUTH EVERY DAY 90 tablet 1   carbamide peroxide (DEBROX) 6.5 % OTIC solution Place 5 drops into the left ear 2 (two) times daily. Must let it sit in ear using gravity lying down for about 5 minutes before letting it pour out 15 mL 0   colchicine 0.6 MG tablet Take 2 tablets (1.2 mg) at the first sign of flare, followed by 0.6 mg after 1 hour if pain not resolved. Then take once daily after that until pain resolves. Continue allopurinol during  gout flares. 180 tablet 2   fluocinonide (LIDEX) 0.05 % external solution      fluticasone (FLONASE) 50 MCG/ACT nasal spray Place 2 sprays into both nostrils daily. 48 g 3   furosemide (LASIX) 20 MG tablet TAKE 1 TABLET BY MOUTH EVERY DAY AS NEEDED 90 tablet 1   metoprolol tartrate (LOPRESSOR) 25 MG tablet TAKE 1/2 TABLET BY MOUTH TWICE A DAY 90 tablet 1   Multiple Vitamin (MULTIVITAMIN) tablet Take 1 tablet by mouth daily.     predniSONE (DELTASONE) 20 MG tablet Take 1 tablet by mouth daily for 5 days, then 1/2 tablet daily for 2 days 6 tablet 0   tamsulosin (FLOMAX) 0.4 MG CAPS capsule TAKE 1 CAPSULE BY MOUTH EVERY DAY 90 capsule 1   triamterene-hydrochlorothiazide (MAXZIDE-25) 37.5-25 MG tablet TAKE 1/2 TABLET BY  MOUTH EVERY DAY 45 tablet 1   warfarin (COUMADIN) 2.5 MG tablet TAKE 1 TABLET DAILY EXCEPT TAKE 1 AND 1/2 TABLETS ON WED AND SAT OR TAKE AS DIRECTED BY CLINIC 120 tablet 1   zolpidem (AMBIEN) 5 MG tablet Take 1 tablet (5 mg total) by mouth at bedtime as needed for sleep (dose increase. does not drive. family will be in home when he takes full dose. if any falls will stop). 30 tablet 5   No current facility-administered medications for this visit.     Objective:  BP 120/70   Pulse 72   Temp (!) 97.3 F (36.3 C)   Ht '5\' 11"'$  (1.803 m)   Wt 152 lb 3.2 oz (69 kg)   SpO2 93%   BMI 21.23 kg/m  Gen: NAD, resting comfortably CV: Regular heart rate Lungs: CTAB no crackles, wheeze, rhonchi Ext: 1+ edema overall stable Skin: warm, dry Normal hip range of motion without pain.  No pain over greater trochanter.  Negative straight leg raise.    Assessment and Plan   #Possible palate infection/soft palate edema-had to stop antibiotics clindamycin- took several days for stomach upset to resolve. Some residual cough- Was within 2 days of finishing.  Reports having ENT follow-up this afternoon.  I later touch base with family and they reported prior infection had cleared and was examined with the scope-no further current follow-up needs reported  #Discolored fingers S: Reports worsening when hands are cold-temperature and discomfort but seems to always have some discomfort in the hands.  Interestingly enough has seen dermatology as well as Dr. Tomi Likens in case neurological per dermatology recommendations.  Family reports of blackish discoloration when hands are cold-improves when warmed hands up. A/P: We attempted to order arterial duplex but this was transitioned by radiology to a Raynaud's study specifically.  It appears on preliminary report that this was normal -Neurology had also previously recommended gabapentin but when patient does not take medication pain does not worsen-he opts to currently  discontinue gabapentin - Also has upcoming dermatology visit in [place] next week -Patient also had prior MRI cervical spine ordered which was never completed back from April from neurology -Patient has seen vascular surgery in the past in 2021 for aortic aneurysm-could refer back for their opinion potentially  # Left lateral hip pain down to calf S: for 2 days could not walk- apparently had mentioned some mild hip pain but had severe pain that kept him from walking. Knows can't take ibuprofen/aleve. Tylenol arthritis has been helpful and feeling much better. Lower level ache. No back pain ROS-no worsening incontinence or new saddle anesthesia reported  A/P:Left lateral hip pain rated getting to  the left calf-concern for radicular symptoms.  He would like to trial a course of prednisone since ibuprofen is not ideal for him on Coumadin.  If not improving he agrees to consider sports medicine or physical therapy   # Atrial fibrillation-follows with Dr. Alvester Chou of cardiology S: Rate controlled with metoprolol 25 mg-only takes 12.5 mg/day per Dr. Gwenlyn Found Anticoagulated with Coumadin-follows in our clinic A/P: appropriately anticoagulated and rate controlled- continue current medicine   #hypertension S: medication: Amlodipine 2.5 mg, triamterene hydrochlorothiazide 37.5-25 mg, Lasix 20 mg as needed for edema, metoprolol 25 mg-only takes 12.5 mg/day per Dr. Gwenlyn Found  BP Readings from Last 3 Encounters:  02/26/22 120/70  02/20/22 130/72  01/20/22 122/64   A/P: Controlled. Continue current medications.   Recommended follow up: Return in about 5 weeks (around 04/02/2022) for followup or sooner if needed.Schedule b4 you leave. Future Appointments  Date Time Provider [place]  03/16/2022  4:15 PM Gardiner Barefoot, DPM [facility]  04/02/2022  2:20 PM Marin Olp, MD [facility]  04/29/2022  4:15 PM [facility] COUMADIN [facility]  07/20/2022  3:30 PM [name] R, DO [facility] None   01/01/2023  3:00 PM [facility] HEALTH COACH [facility]    Lab/Order associations:   ICD-10-CM   1. Atrial fibrillation, unspecified type ([place])  I48.91     2. Essential hypertension  I10       Meds ordered this encounter  Medications   predniSONE (DELTASONE) 20 MG tablet    Sig: Take 1 tablet by mouth daily for 5 days, then 1/2 tablet daily for 2 days    Dispense:  6 tablet    Refill:  0    Return precautions advised.  [name], MD

## 2022-03-02 ENCOUNTER — Ambulatory Visit: Payer: Medicare Other | Admitting: Podiatry

## 2022-03-06 ENCOUNTER — Encounter: Payer: Medicare Other | Admitting: Family Medicine

## 2022-03-10 ENCOUNTER — Telehealth: Payer: Self-pay | Admitting: Family Medicine

## 2022-03-10 NOTE — Telephone Encounter (Signed)
Caller states: -Patient was seen by previous dermatologist, Dr. Rolm Bookbinder, in January 2023.  - Dr. Ubaldo Glassing removed pre-cancerous spots from patient's head and prescribed him fluocinonide ointment to use.  - Dr. Ubaldo Glassing retired so pt saw another dermatology provider to get spots removed again but didn't realize he needed more of the ointment   Caller requests: -PCP send in refill of fluocinonide ointment   I encouraged caller to call dermatology office to see if they could fill the medication while they wait for PCP response. Caller verbalized understanding and stated she will do so.

## 2022-03-11 MED ORDER — FLUOCINONIDE 0.05 % EX SOLN: CUTANEOUS | 3 refills | Status: DC

## 2022-03-11 NOTE — Telephone Encounter (Signed)
Rx refilled.

## 2022-03-16 ENCOUNTER — Ambulatory Visit (INDEPENDENT_AMBULATORY_CARE_PROVIDER_SITE_OTHER): Payer: Medicare Other | Admitting: Podiatry

## 2022-03-16 ENCOUNTER — Ambulatory Visit: Payer: Medicare Other | Admitting: Podiatry

## 2022-03-16 ENCOUNTER — Encounter: Payer: Self-pay | Admitting: Podiatry

## 2022-03-16 DIAGNOSIS — M79674 Pain in right toe(s): Secondary | ICD-10-CM

## 2022-03-16 DIAGNOSIS — D696 Thrombocytopenia, unspecified: Secondary | ICD-10-CM

## 2022-03-16 DIAGNOSIS — I739 Peripheral vascular disease, unspecified: Secondary | ICD-10-CM

## 2022-03-16 DIAGNOSIS — B351 Tinea unguium: Secondary | ICD-10-CM | POA: Diagnosis not present

## 2022-03-16 DIAGNOSIS — D689 Coagulation defect, unspecified: Secondary | ICD-10-CM | POA: Insufficient documentation

## 2022-03-16 DIAGNOSIS — N1831 Chronic kidney disease, stage 3a: Secondary | ICD-10-CM | POA: Diagnosis not present

## 2022-03-16 DIAGNOSIS — M79675 Pain in left toe(s): Secondary | ICD-10-CM

## 2022-03-16 NOTE — Progress Notes (Signed)
This patient returns to my office for at risk foot care.  This patient requires this care by a professional since this patient will be at risk due to having  PAD, CKD coagulation defect and thrombocytopenia.  Patient is taking coumadin.  This patient is unable to cut nails himself since the patient cannot reach his nails.These nails are painful walking and wearing shoes. This patient presents for at risk foot care today.  General Appearance  Alert, conversant and in no acute stress.  Vascular  Dorsalis pedis and posterior tibial  pulses are  weakly palpable  bilaterally.  Capillary return is within normal limits  bilaterally. Temperature is within normal limits  bilaterally. Venous disease feet dorsally  B/l.  Neurologic  Senn-Weinstein monofilament wire test within normal limits  bilaterally. Muscle power within normal limits bilaterally.  Nails Thick disfigured discolored nails with subungual debris  from hallux to fifth toes bilaterally. No evidence of bacterial infection or drainage bilaterally.  Orthopedic  No limitations of motion  feet .  No crepitus or effusions noted.  No bony pathology or digital deformities noted.  Skin  normotropic skin noted bilaterally.  No signs of infections or ulcers noted.     Onychomycosis  Pain in right toes  Pain in left toes  Porokeratosis left heel.    Consent was obtained for treatment procedures.   Mechanical debridement of nails 1-5  bilaterally performed with a nail nipper.  Filed with dremel without incident.    Return office visit    3  months                  Told patient to return for periodic foot care and evaluation due to potential at risk complications.   Gardiner Barefoot DPM

## 2022-04-02 ENCOUNTER — Encounter: Payer: Self-pay | Admitting: Family Medicine

## 2022-04-02 ENCOUNTER — Ambulatory Visit: Payer: Medicare Other | Admitting: Family Medicine

## 2022-04-02 VITALS — BP 108/62 | HR 60 | Temp 97.3°F | Ht 71.0 in | Wt 162.2 lb

## 2022-04-02 DIAGNOSIS — I4891 Unspecified atrial fibrillation: Secondary | ICD-10-CM | POA: Diagnosis not present

## 2022-04-02 DIAGNOSIS — I10 Essential (primary) hypertension: Secondary | ICD-10-CM

## 2022-04-02 DIAGNOSIS — I7300 Raynaud's syndrome without gangrene: Secondary | ICD-10-CM

## 2022-04-02 NOTE — Patient Instructions (Addendum)
Agree with possible raynaud's concern- lets stop metoprolol to see if helps at all- watch blood pressure and heart rate over the next week then update me- if blood pressure can tolerate it id like to increase amlodipine to see if that helps with blood flow to hands  We will call you within two weeks about your referral to rheumatology. If you do not hear within 2 weeks, give [place] a call.   Recommended follow up: Return in about 1 month (around 05/03/2022) for followup or sooner if needed.Schedule b4 you leave.

## 2022-04-02 NOTE — Progress Notes (Signed)
Phone [phone] In person visit   Subjective:   William Mahoney is a 88 y.o. year old very pleasant male patient who presents for/with See problem oriented charting Chief Complaint  Patient presents with   Follow-up    Pt is here to f/u on Raynauds, nails are falling off and hands are not better.    Past Medical History-  Patient Active Problem List   Diagnosis Date Noted   AAA (abdominal aortic aneurysm) without rupture 11/20/2019    Priority: High   Edema 11/10/2016    Priority: High   Atrial fibrillation ([place]) 12/07/2008    Priority: High   Hyperglycemia 06/25/2021    Priority: Medium    PAD (peripheral artery disease) ([place]) 11/20/2019    Priority: Medium    Chronic kidney disease (CKD), stage III (moderate) ([place]) 02/15/2019    Priority: Medium    Aortic atherosclerosis ([place]) 01/05/2019    Priority: Medium    Thrombocytopenia ([place]) 11/10/2016    Priority: Medium    Insomnia 10/19/2014    Priority: Medium    Gout 01/09/2014    Priority: Medium    History of cancer of rectosigmoid junction 01/04/2008    Priority: Medium    Former smoker 06/23/2007    Priority: Medium    Hyperlipidemia, unspecified 02/09/2007    Priority: Medium    Essential hypertension 12/07/2006    Priority: Medium    COPD (chronic obstructive pulmonary disease) ([place]) 12/07/2006    Priority: Medium    BPH (benign prostatic hyperplasia) 12/07/2006    Priority: Medium    Encounter for therapeutic drug monitoring 08/27/2014    Priority: Low   Allergic rhinitis 08/15/2013    Priority: Low   Arthritis of lumbar spine 01/20/2013    Priority: Low   Inguinal hernia 03/21/2010    Priority: Low   BASAL CELL CARCINOMA SKIN LOWER LIMB INCL HIP 03/21/2010    Priority: Low   Actinic keratosis 12/07/2008    Priority: Low   TMJ (temporomandibular joint disorder) 02/09/2007    Priority: Low   URINARY INCONTINENCE 12/07/2006    Priority: Low   PANCREATITIS, HX OF 12/07/2006    Priority: Low    Blood clotting disorder ([place]) 03/16/2022   Porokeratosis 11/25/2021    Medications- reviewed and updated Current Outpatient Medications  Medication Sig Dispense Refill   acetic acid-hydrocortisone (VOSOL-HC) OTIC solution Place 4 drops into the left ear 2 (two) times daily. Apply to affected ear. For ear canal irritation/otitis externa. 10 mL 0   allopurinol (ZYLOPRIM) 300 MG tablet TAKE ONE-HALF TABLET BY MOUTH IN THE EVENING 45 tablet 1   ALPRAZolam (XANAX) 0.25 MG tablet Take 1 tablet (0.25 mg total) by mouth 2 (two) times daily as needed for anxiety (do not drive for 8 hours after taking. have someone by your side with walking if you take this. do not take within 8 hours of ambien.). 20 tablet 0   amLODipine (NORVASC) 2.5 MG tablet TAKE 1 TABLET BY MOUTH EVERY DAY 90 tablet 1   fluocinonide (LIDEX) 0.05 % external solution APPLY 1 ML TO SKIN EVERY NIGHT APPLY TO AFFECTED AREAS OF SCALP AND CHEST NIGHTLY 60 mL 3   fluticasone (FLONASE) 50 MCG/ACT nasal spray Place 2 sprays into both nostrils daily. 48 g 3   furosemide (LASIX) 20 MG tablet TAKE 1 TABLET BY MOUTH EVERY DAY AS NEEDED 90 tablet 1   metoprolol tartrate (LOPRESSOR) 25 MG tablet TAKE 1/2 TABLET BY MOUTH TWICE A DAY 90 tablet 1  Multiple Vitamin (MULTIVITAMIN) tablet Take 1 tablet by mouth daily.     tamsulosin (FLOMAX) 0.4 MG CAPS capsule TAKE 1 CAPSULE BY MOUTH EVERY DAY 90 capsule 1   triamterene-hydrochlorothiazide (MAXZIDE-25) 37.5-25 MG tablet TAKE 1/2 TABLET BY MOUTH EVERY DAY 45 tablet 1   warfarin (COUMADIN) 2.5 MG tablet TAKE 1 TABLET DAILY EXCEPT TAKE 1 AND 1/2 TABLETS ON WED AND SAT OR TAKE AS DIRECTED BY CLINIC 120 tablet 1   zolpidem (AMBIEN) 5 MG tablet Take 1 tablet (5 mg total) by mouth at bedtime as needed for sleep (dose increase. does not drive. family will be in home when he takes full dose. if any falls will stop). 30 tablet 5   colchicine 0.6 MG tablet Take 2 tablets (1.2 mg) at the first sign of flare,  followed by 0.6 mg after 1 hour if pain not resolved. Then take once daily after that until pain resolves. Continue allopurinol during gout flares. 180 tablet 2   No current facility-administered medications for this visit.     Objective:  BP 108/62   Pulse 60   Temp (!) 97.3 F (36.3 C)   Ht '5\' 11"'$  (1.803 m)   Wt 162 lb 3.2 oz (73.6 kg)   SpO2 96%   BMI 22.62 kg/m  Gen: NAD, resting comfortably CV: irregularly irregular,  no murmurs rubs or gallops Lungs: CTAB no crackles, wheeze, rhonchi Abdomen: soft/nontender/nondistended/normal bowel sounds. No rebound or guarding.  Ext: trace edema Skin: warm, dry, red fingers-extends to the knuckles, multiple brittle or broken nails    Assessment and Plan   # Raynaud's-ongoing issues with red, tingling hands-that turn blue with cold S:also bluish discoloration when they get cold concerning for raynauds though ultrasound testing not clearly positive. Seems to have started in January with intense stress with now deceased but at that time ailing wife.  -Family is also aware that beta-blockers like metoprolol could contribute (we discussed that is less likely to affect him and his mother beta-blockers) -Before knowing about the bluish skin changes we had also referred him to dermatology-he has tried multiple creams without clear benefit though most recently left and dermatology recommended referral to rheumatology-he was referred in November but has never heard -Better with heat-doubt erythromelalgia A/P:  From AVS:  " Agree with possible raynaud's concern- lets stop metoprolol to see if helps at all- watch blood pressure and heart rate over the next week then update me- if blood pressure can tolerate it id like to increase amlodipine to see if that helps with blood flow to hands "  -venous insufficiency history and could swell more on increased amlodipine but I think it is worth trying -refer to rheumatology-discussed basic rheumatology labs  such as ANA but he declines for now -Brittle or broken nails are making it hard to put his gloves on which are helpful-we discussed some possible silicone sleeves-also wonder if the improved blood flow if this may help the nails long-term   # Atrial fibrillation-follows with Dr. Gwenlyn Found of cardiology S: Rate controlled with metoprolol 25 mg-only takes 12.5 mg/day per Dr. Gwenlyn Found Anticoagulated with Coumadin-follows in our clinic A/P: Appropriately anticoagulated-continue Coumadin. - In regards to metoprolol we opted to hold this for now (not removing from medication list yet) and discussed if heart rate elevates particularly over 90 or 100 restart metoprolol.  Our hope is that blood pressure increases slightly off medication and remains rate controlled off medication so that we can increase amlodipine as above  #hypertension #Venous  insufficiency S: medication: Amlodipine 2.5 mg, triamterene hydrochlorothiazide 37.5-25 mg, Lasix 20 mg as needed for edema, metoprolol 25 mg-only takes 12.5 mg/day per Dr. Gwenlyn Found   BP Readings from Last 3 Encounters:  04/02/22 108/62  02/26/22 120/70  02/20/22 130/72    A/P: Blood pressure perhaps overcontrolled but would like to increase amlodipine to try to help with potential Raynaud's-we are holding metoprolol and he will update me in a week-if blood pressure and heart rate tolerate this change-likely increase amlodipine to 5 mg though discussed risks of worsening venous insufficiency-mild issues at present  Recommended follow up: Return in about 1 month (around 05/03/2022) for followup or sooner if needed.Schedule b4 you leave. Future Appointments  Date Time Provider [place]  04/29/2022  4:15 PM [facility]  05/04/2022  2:20 PM Marin Olp, MD [facility]  06/16/2022  4:15 PM Gardiner Barefoot, DPM [facility]  07/20/2022  3:30 PM Pieter Partridge, DO [facility] None  01/01/2023  3:00 PM [facility] HEALTH COACH [facility]    Lab/Order associations:   ICD-10-CM   1. Raynaud's phenomenon without gangrene  I73.00 Ambulatory referral to Rheumatology    2. Atrial fibrillation, unspecified type ([place])  I48.91     3. Essential hypertension  I10      Return precautions advised.  [name], MD

## 2022-04-04 ENCOUNTER — Other Ambulatory Visit: Payer: Self-pay | Admitting: Family Medicine

## 2022-04-29 ENCOUNTER — Ambulatory Visit (INDEPENDENT_AMBULATORY_CARE_PROVIDER_SITE_OTHER): Payer: Medicare Other

## 2022-04-29 ENCOUNTER — Telehealth: Payer: Self-pay

## 2022-04-29 DIAGNOSIS — Z7901 Long term (current) use of anticoagulants: Secondary | ICD-10-CM | POA: Diagnosis not present

## 2022-04-29 LAB — POCT INR: INR: 2.1 (ref 2.0–3.0)

## 2022-04-29 NOTE — Progress Notes (Signed)
Pt started terbinafine 5 days ago. Moderate interaction with warfarin.  Continue 1 tablet daily except take 1 1/2 tablets on Mondays, Wednesdays and Fridays.  Recheck at Dr. Ansel Bong apt on 1/15. Will call with result of that INR and any dosing instructions. .   Advised pt would like to recheck INR in one week due to the new medication to assure it is will not increase his INR. Pt reported he has apt with PCP on 1/15. Advised a msg would be sent to PCP to request a lab INR be drawn. Advised this nurse will f/u with him/daughter that day if the result is back, if not it will be the next day when the result is back. Confirmed pt's daughter has coumadin clinic number. Advised if any bleeding to contact office or go to the ER. Pt and daughter verbalized understanding.  Sent msg to PCP to request lab INR added while at apt on 1/15.

## 2022-04-29 NOTE — Telephone Encounter (Signed)
Yes  will plan on this- thanks for caring for him!

## 2022-04-29 NOTE — Telephone Encounter (Signed)
Pt in today for INR check. Pt started terbinafine 5 days ago. Moderate interaction with warfarin.  INR today was 2.1 but unsure if pt has been taking new medication long enough to show if an interaction will occur for this pt.  Advised pt I would like to recheck INR in one week due to the new medication to assure it will not increase his INR. Pt reported he has apt with PCP on 1/15. Advised a msg would be sent to PCP to request a lab INR be drawn at that apt. Advised this nurse will f/u with him/daughter that day if the result is back, if not it will be the next day when the result is back. Confirmed pt's daughter has coumadin clinic number. Advised if any bleeding to contact office or go to the ER. Pt and daughter verbalized understanding.  Sent msg to PCP advising of need for INR check at [facility] on 1/15. Order has been placed.

## 2022-04-29 NOTE — Patient Instructions (Addendum)
Pre visit review using our clinic review tool, if applicable. No additional management support is needed unless otherwise documented below in the visit note.  Continue 1 tablet daily except take 1 1/2 tablets on Mondays, Wednesdays and Fridays.  Recheck at Dr. Ansel Bong apt on 1/15. Will call with result of that INR and any dosing instructions. [name]

## 2022-05-04 ENCOUNTER — Ambulatory Visit (INDEPENDENT_AMBULATORY_CARE_PROVIDER_SITE_OTHER): Payer: Medicare Other | Admitting: Family Medicine

## 2022-05-04 ENCOUNTER — Encounter: Payer: Self-pay | Admitting: Family Medicine

## 2022-05-04 VITALS — BP 128/70 | HR 74 | Temp 98.0°F | Ht 71.0 in | Wt 163.8 lb

## 2022-05-04 DIAGNOSIS — E785 Hyperlipidemia, unspecified: Secondary | ICD-10-CM

## 2022-05-04 DIAGNOSIS — I4891 Unspecified atrial fibrillation: Secondary | ICD-10-CM | POA: Diagnosis not present

## 2022-05-04 DIAGNOSIS — R739 Hyperglycemia, unspecified: Secondary | ICD-10-CM | POA: Diagnosis not present

## 2022-05-04 DIAGNOSIS — I700 Atherosclerosis of aorta: Secondary | ICD-10-CM | POA: Diagnosis not present

## 2022-05-04 MED ORDER — AMLODIPINE BESYLATE 5 MG PO TABS: 5.0000 mg | ORAL_TABLET | Freq: Every day | ORAL | 5 refills | Status: DC

## 2022-05-04 NOTE — Progress Notes (Signed)
Phone [phone] In person visit   Subjective:   William Mahoney is a 89 y.o. year old very pleasant male patient who presents for/with See problem oriented charting Chief Complaint  Patient presents with   Follow-up    Pt states skin and nails getting worse.    Hypertension   Past Medical History-  Patient Active Problem List   Diagnosis Date Noted   AAA (abdominal aortic aneurysm) without rupture 11/20/2019    Priority: High   Edema 11/10/2016    Priority: High   Atrial fibrillation ([place]) 12/07/2008    Priority: High   Hyperglycemia 06/25/2021    Priority: Medium    PAD (peripheral artery disease) ([place]) 11/20/2019    Priority: Medium    Chronic kidney disease (CKD), stage III (moderate) ([place]) 02/15/2019    Priority: Medium    Aortic atherosclerosis ([place]) 01/05/2019    Priority: Medium    Thrombocytopenia ([place]) 11/10/2016    Priority: Medium    Insomnia 10/19/2014    Priority: Medium    Gout 01/09/2014    Priority: Medium    History of cancer of rectosigmoid junction 01/04/2008    Priority: Medium    Former smoker 06/23/2007    Priority: Medium    Hyperlipidemia, unspecified 02/09/2007    Priority: Medium    Essential hypertension 12/07/2006    Priority: Medium    COPD (chronic obstructive pulmonary disease) ([place]) 12/07/2006    Priority: Medium    BPH (benign prostatic hyperplasia) 12/07/2006    Priority: Medium    Encounter for therapeutic drug monitoring 08/27/2014    Priority: Low   Allergic rhinitis 08/15/2013    Priority: Low   Arthritis of lumbar spine 01/20/2013    Priority: Low   Inguinal hernia 03/21/2010    Priority: Low   BASAL CELL CARCINOMA SKIN LOWER LIMB INCL HIP 03/21/2010    Priority: Low   Actinic keratosis 12/07/2008    Priority: Low   TMJ (temporomandibular joint disorder) 02/09/2007    Priority: Low   URINARY INCONTINENCE 12/07/2006    Priority: Low   PANCREATITIS, HX OF 12/07/2006    Priority: Low   Blood clotting disorder  ([place]) 03/16/2022   Porokeratosis 11/25/2021    Medications- reviewed and updated Current Outpatient Medications  Medication Sig Dispense Refill   acetic acid-hydrocortisone (VOSOL-HC) OTIC solution Place 4 drops into the left ear 2 (two) times daily. Apply to affected ear. For ear canal irritation/otitis externa. 10 mL 0   allopurinol (ZYLOPRIM) 300 MG tablet TAKE ONE-HALF TABLET BY MOUTH IN THE EVENING 45 tablet 1   fluocinonide (LIDEX) 0.05 % external solution APPLY 1 ML TO SKIN EVERY NIGHT APPLY TO AFFECTED AREAS OF SCALP AND CHEST NIGHTLY 60 mL 3   fluticasone (FLONASE) 50 MCG/ACT nasal spray Place 2 sprays into both nostrils daily. 48 g 3   furosemide (LASIX) 20 MG tablet TAKE 1 TABLET BY MOUTH EVERY DAY AS NEEDED 90 tablet 1   Multiple Vitamin (MULTIVITAMIN) tablet Take 1 tablet by mouth daily.     tamsulosin (FLOMAX) 0.4 MG CAPS capsule TAKE 1 CAPSULE BY MOUTH EVERY DAY 90 capsule 1   terbinafine (LAMISIL) 250 MG tablet Take 250 mg by mouth daily.     triamterene-hydrochlorothiazide (MAXZIDE-25) 37.5-25 MG tablet TAKE 1/2 TABLET BY MOUTH EVERY DAY 45 tablet 1   warfarin (COUMADIN) 2.5 MG tablet TAKE 1 TABLET DAILY EXCEPT TAKE 1 AND 1/2 TABLETS ON WED AND SAT OR TAKE AS DIRECTED BY CLINIC 120 tablet 1  zolpidem (AMBIEN) 5 MG tablet Take 1 tablet (5 mg total) by mouth at bedtime as needed for sleep (dose increase. does not drive. family will be in home when he takes full dose. if any falls will stop). 30 tablet 5   amLODipine (NORVASC) 5 MG tablet Take 1 tablet (5 mg total) by mouth daily. For blood pressure and possible Raynauds 30 tablet 5   No current facility-administered medications for this visit.     Objective:  BP 128/70   Pulse 74   Temp 98 F (36.7 C) (Temporal)   Ht '5\' 11"'$  (1.803 m)   Wt 163 lb 12.8 oz (74.3 kg)   SpO2 97%   BMI 22.85 kg/m  Gen: NAD, resting comfortably CV: irregularly irregular  Lungs: CTAB no crackles, wheeze, rhonchi  Ext: 1+ edema Skin: warm,  dry, all fingers up to the knuckles reddish discoloration compared to the rest of the scan    Assessment and Plan   # Concern for Raynaud's-ongoing issues with red, tingling hands that turn blue and cold S:At last visit we opted to stop metoprolol to see if that would help at all with plan to watch blood pressure and heart rate.  Plan was to increase amlodipine if blood pressure headspace. Home BPs 120-130s - We also referred to rheumatology and he wanted to hold off on rheumatology labs-he has a visit in March - Also was having brittle/broken nails and we had discussed silicone sleeves -Has seen dermatology Jule Ser tried ammonium lactate lotion, also has seen [facility]  Dr. Elvera Lennox- most recently nail clipping showing fungus and started on terbinafine) for this and tried multiple creams without clear benefit-apparently dermatology had also recommended rheumatology consult -on day 9 of terbinafine  No improvement yet in reddish hands that turn blue when they get cold. Got a lot of hand warmers for Christmas- that helps some.  A/P: still with concern for Raynauds- we will increase amlodipine and stay off metoprolol. I am not overly confident in this diagnosis but fits best with the bluish discoloration when hands get cold that he reports (but raynauds study were not positive).   -upcoming rheumatology visit- have also considered vascular as we do not have clear answer yet and seemst o reallysdistress him -  For his nails thankful for diagnosis of fungal infection/onychomycosis-hoping terbinafine will be beneficial-recommended 5-week recheck so we could monitor LFTs-does not appear to be planned by dermatology  # Atrial fibrillation-follows with Dr. Gwenlyn Found of cardiology S: Rate controlled with metoprolol 25 mg-only takes 12.5 mg/day per Dr. Gwenlyn Found -Trial off December 2023 to see if helps with Raynaud's and if we can increase amlodipine-he has not noted rapid heart rate Anticoagulated  with Coumadin-follows in our clinic A/P: Appropriately anticoagulated.  Currently rate controlled without medication - Due for PT/INR after starting terbinafine-we checked with labs  #hypertension S: medication: Amlodipine 2.5 mg--> 5 mg, triamterene hydrochlorothiazide 37.5-25 mg, Lasix 20 mg as needed for edema -trying off in 2023/2024 for space with amlodipine-   metoprolol 25 mg-only took 12.5 mg/day per Dr. Gwenlyn Found as some concern associated with raynauds Home readings #s: 120s or 130s A/P: Blood pressure is well-controlled despite stopping metoprolol-which family had read could worsen Raynaud's.  Will try to uptitrate amlodipine to 5 mg  #PAD added by vascular surgery 11/20/2019 but appears this is in reference to aneurysm which is followed yearly or aortic atherosclerosis #hyperlipidemia S: Medication: None for lipids, is on Coumadin  A/P: Will update lipid panel-with aortic atherosclerosis could  consider statin even if just once a week to start  # Hyperglycemia/insulin resistance/prediabetes S:  Medication: None Lab Results  Component Value Date   HGBA1C 6.3 06/24/2021   A/P: prediabetes noted- update a1c    Recommended follow up: Return in about 5 weeks (around 06/08/2022) for followup or sooner if needed.Schedule b4 you leave. Future Appointments  Date Time Provider [place]  06/08/2022  3:00 PM Marin Olp, MD [facility]  06/16/2022  4:15 PM Gardiner Barefoot, DPM [facility]  07/08/2022  8:40 AM Benjamine Mola Resa Miner, MD [facility] None  07/20/2022  3:30 PM Pieter Partridge, DO [facility] None  01/01/2023  3:00 PM [facility] HEALTH COACH [facility]    Lab/Order associations:   ICD-10-CM   1. Atrial fibrillation, unspecified type ([place])  I48.91 CBC with Differential/Platelet    Comprehensive metabolic panel    Protime-INR    2. Hyperglycemia  R73.9 HgB A1c    3. Hyperlipidemia, unspecified hyperlipidemia type  E78.5 Lipid panel    4. Aortic atherosclerosis ([facility])   I70.0       Meds ordered this encounter  Medications   amLODipine (NORVASC) 5 MG tablet    Sig: Take 1 tablet (5 mg total) by mouth daily. For blood pressure and possible Raynauds    Dispense:  30 tablet    Refill:  5    Return precautions advised.  [name], MD

## 2022-05-04 NOTE — Patient Instructions (Addendum)
Please stop by lab before you go If you have mychart- we will send your results within 3 business days of [place] receiving them.  If you do not have mychart- we will call you about results within 5 business days of [place] receiving them.  *please also note that you will see labs on mychart as soon as they post. I will later go in and write notes on them- will say "notes from Dr. Yong Channel"   Increase amlodipine from 2.5 mg to 5 mg   Follow up in 5 weeks- recheck liver then on terbinafine  Hoping swelling in legs doesn't worsen much on amlodipine. Consider compression stockings  Recommended follow up: Return in about 5 weeks (around 06/08/2022) for followup or sooner if needed.Schedule b4 you leave.

## 2022-05-05 ENCOUNTER — Other Ambulatory Visit: Payer: Medicare Other

## 2022-05-05 LAB — CBC WITH DIFFERENTIAL/PLATELET
Basophils Absolute: 0.1 10*3/uL (ref 0.0–0.1)
Basophils Relative: 1 % (ref 0.0–3.0)
Eosinophils Absolute: 0.1 10*3/uL (ref 0.0–0.7)
Eosinophils Relative: 2.3 % (ref 0.0–5.0)
HCT: 43.7 % (ref 39.0–52.0)
Hemoglobin: 14.3 g/dL (ref 13.0–17.0)
Lymphocytes Relative: 16.2 % (ref 12.0–46.0)
Lymphs Abs: 1 10*3/uL (ref 0.7–4.0)
MCHC: 32.8 g/dL (ref 30.0–36.0)
MCV: 93.1 fl (ref 78.0–100.0)
Monocytes Absolute: 0.6 10*3/uL (ref 0.1–1.0)
Monocytes Relative: 9.6 % (ref 3.0–12.0)
Neutro Abs: 4.3 10*3/uL (ref 1.4–7.7)
Neutrophils Relative %: 70.9 % (ref 43.0–77.0)
Platelets: 135 10*3/uL — ABNORMAL LOW (ref 150.0–400.0)
RBC: 4.69 Mil/uL (ref 4.22–5.81)
RDW: 15.4 % (ref 11.5–15.5)
WBC: 6 10*3/uL (ref 4.0–10.5)

## 2022-05-05 LAB — PROTIME-INR
INR: 1.8 ratio — ABNORMAL HIGH (ref 0.8–1.0)
Prothrombin Time: 18.7 s — ABNORMAL HIGH (ref 9.6–13.1)

## 2022-05-05 LAB — HEMOGLOBIN A1C: Hgb A1c MFr Bld: 6.5 % (ref 4.6–6.5)

## 2022-05-05 LAB — LIPID PANEL
Cholesterol: 204 mg/dL — ABNORMAL HIGH (ref 0–200)
HDL: 49 mg/dL (ref >39.00)
LDL Cholesterol: 124 mg/dL — ABNORMAL HIGH (ref 0–99)
NonHDL: 155.43
Total CHOL/HDL Ratio: 4
Triglycerides: 159 mg/dL — ABNORMAL HIGH (ref 0.0–149.0)
VLDL: 31.8 mg/dL (ref 0.0–40.0)

## 2022-05-05 LAB — COMPREHENSIVE METABOLIC PANEL
ALT: 13 U/L (ref 0–53)
AST: 25 U/L (ref 0–37)
Albumin: 4.3 g/dL (ref 3.5–5.2)
Alkaline Phosphatase: 62 U/L (ref 39–117)
BUN: 30 mg/dL — ABNORMAL HIGH (ref 6–23)
CO2: 30 mEq/L (ref 19–32)
Calcium: 9.8 mg/dL (ref 8.4–10.5)
Chloride: 103 mEq/L (ref 96–112)
Creatinine, Ser: 1.37 mg/dL (ref 0.40–1.50)
GFR: 44.14 mL/min — ABNORMAL LOW (ref >60.00)
Glucose, Bld: 137 mg/dL — ABNORMAL HIGH (ref 70–99)
Potassium: 4.5 mEq/L (ref 3.5–5.1)
Sodium: 142 mEq/L (ref 135–145)
Total Bilirubin: 0.7 mg/dL (ref 0.2–1.2)
Total Protein: 6.4 g/dL (ref 6.0–8.3)

## 2022-06-01 ENCOUNTER — Ambulatory Visit (INDEPENDENT_AMBULATORY_CARE_PROVIDER_SITE_OTHER): Payer: Medicare Other

## 2022-06-01 ENCOUNTER — Ambulatory Visit: Payer: Medicare Other | Admitting: Family Medicine

## 2022-06-01 ENCOUNTER — Ambulatory Visit: Payer: Medicare Other

## 2022-06-01 ENCOUNTER — Encounter: Payer: Self-pay | Admitting: Family Medicine

## 2022-06-01 ENCOUNTER — Ambulatory Visit (HOSPITAL_BASED_OUTPATIENT_CLINIC_OR_DEPARTMENT_OTHER)
Admission: RE | Admit: 2022-06-01 | Discharge: 2022-06-01 | Disposition: A | Discharge status: Home / Self Care | Payer: Medicare Other | Source: Ambulatory Visit | Attending: Family Medicine | Admitting: Family Medicine

## 2022-06-01 VITALS — BP 120/64 | HR 73 | Temp 97.3°F | Ht 71.0 in | Wt 163.8 lb

## 2022-06-01 DIAGNOSIS — M25571 Pain in right ankle and joints of right foot: Secondary | ICD-10-CM | POA: Diagnosis present

## 2022-06-01 DIAGNOSIS — I10 Essential (primary) hypertension: Secondary | ICD-10-CM | POA: Diagnosis not present

## 2022-06-01 DIAGNOSIS — I4891 Unspecified atrial fibrillation: Secondary | ICD-10-CM

## 2022-06-01 LAB — COMPREHENSIVE METABOLIC PANEL
ALT: 13 U/L (ref 0–53)
AST: 21 U/L (ref 0–37)
Albumin: 4.2 g/dL (ref 3.5–5.2)
Alkaline Phosphatase: 71 U/L (ref 39–117)
BUN: 28 mg/dL — ABNORMAL HIGH (ref 6–23)
CO2: 32 mEq/L (ref 19–32)
Calcium: 9.6 mg/dL (ref 8.4–10.5)
Chloride: 97 mEq/L (ref 96–112)
Creatinine, Ser: 1.46 mg/dL (ref 0.40–1.50)
GFR: 40.87 mL/min — ABNORMAL LOW (ref >60.00)
Glucose, Bld: 91 mg/dL (ref 70–99)
Potassium: 3.4 mEq/L — ABNORMAL LOW (ref 3.5–5.1)
Sodium: 141 mEq/L (ref 135–145)
Total Bilirubin: 0.8 mg/dL (ref 0.2–1.2)
Total Protein: 6.4 g/dL (ref 6.0–8.3)

## 2022-06-01 LAB — PROTIME-INR
INR: 1.7 ratio — ABNORMAL HIGH (ref 0.8–1.0)
Prothrombin Time: 17.7 s — ABNORMAL HIGH (ref 9.6–13.1)

## 2022-06-01 NOTE — Progress Notes (Unsigned)
William Mahoney D.William Mahoney William Mahoney Phone: [phone]   Assessment and Plan:     There are no diagnoses linked to this encounter.  ***   Pertinent previous records reviewed include ***   Follow Up: ***     Subjective:   I, William Mahoney, am serving as a Education administrator for William Mahoney  Chief Complaint: right ankle pain   HPI:   06/02/2022 Patient is a 89 year old male complaining of right ankle pain. Patient states atient had a fall when trying to get out of bed last Wednesday, February 7. Got out of bed and took 2 steps and knew he was going down- felt weak in leg. William Mahoney was out of reach (advised to put closer to bed)    He has noted pain in his right lower leg as well as right foot and ankle when putting pressure on them since that time.Pain now concentrates over right lateral mallelous - goes across the ankle towards the other side with pressure.  -did not tell his daughter until Saturday morning and she scheduled a visit with [place] asap- he was concerned about covid/flu risk so wanted to avoid urgent care - is able to walk on it but has significant pain up to 10/10. He dos agree to use cane or walker at all times. Most of the time more 6/10 pain. Not taking pain medicine other than tylenol at night (have advised against tylenol PM particularly with fall risk)  Relevant Historical Information: ***  Additional pertinent review of systems negative.   Current Outpatient Medications:    acetic acid-hydrocortisone (VOSOL-HC) OTIC solution, Place 4 drops into the left ear 2 (two) times daily. Apply to affected ear. For ear canal irritation/otitis externa., Disp: 10 mL, Rfl: 0   allopurinol (ZYLOPRIM) 300 MG tablet, TAKE ONE-HALF TABLET BY MOUTH IN THE EVENING, Disp: 45 tablet, Rfl: 1   amLODipine (NORVASC) 5 MG tablet, Take 1 tablet (5 mg total) by mouth daily. For blood pressure and possible Raynauds, Disp: 30  tablet, Rfl: 5   fluocinonide (LIDEX) 0.05 % external solution, APPLY 1 ML TO SKIN EVERY NIGHT APPLY TO AFFECTED AREAS OF SCALP AND CHEST NIGHTLY, Disp: 60 mL, Rfl: 3   fluticasone (FLONASE) 50 MCG/ACT nasal spray, Place 2 sprays into both nostrils daily., Disp: 48 g, Rfl: 3   furosemide (LASIX) 20 MG tablet, TAKE 1 TABLET BY MOUTH EVERY DAY AS NEEDED, Disp: 90 tablet, Rfl: 1   Multiple Vitamin (MULTIVITAMIN) tablet, Take 1 tablet by mouth daily., Disp: , Rfl:    tamsulosin (FLOMAX) 0.4 MG CAPS capsule, TAKE 1 CAPSULE BY MOUTH EVERY DAY, Disp: 90 capsule, Rfl: 1   terbinafine (LAMISIL) 250 MG tablet, Take 250 mg by mouth daily., Disp: , Rfl:    triamterene-hydrochlorothiazide (MAXZIDE-25) 37.5-25 MG tablet, TAKE 1/2 TABLET BY MOUTH EVERY DAY, Disp: 45 tablet, Rfl: 1   warfarin (COUMADIN) 2.5 MG tablet, TAKE 1 TABLET DAILY EXCEPT TAKE 1 AND 1/2 TABLETS ON WED AND SAT OR TAKE AS DIRECTED BY CLINIC, Disp: 120 tablet, Rfl: 1   zolpidem (AMBIEN) 5 MG tablet, Take 1 tablet (5 mg total) by mouth at bedtime as needed for sleep (dose increase. does not drive. family will be in home when he takes full dose. if any falls will stop)., Disp: 30 tablet, Rfl: 5   Objective:     There were no vitals filed for this visit.    There is  no height or weight on file to calculate BMI.    Physical Exam:    ***   Electronically signed by:  William Mahoney D.William Mahoney [facility] 4:32 PM 06/01/22

## 2022-06-01 NOTE — Patient Instructions (Addendum)
Pre visit review using our clinic review tool, if applicable. No additional management support is needed unless otherwise documented below in the visit note.  Increase dose today to take 2 tablets and then change weekly dose to take 1 1/2 tablets daily except take 1 Sunday, Tuesday and Thursday.  Recheck in 3 weeks.

## 2022-06-01 NOTE — Progress Notes (Signed)
Pt had fall last week and resulting ankle/foot pain. Pt had OV with PCP today and had INR drawn and cancelled apt with coumadin clinic today.  Increase dose today to take 2 tablets and then change weekly dose to take 1 1/2 tablets daily except take 1 Sunday, Tuesday and Thursday.  Recheck in 3 weeks.   Contacted pt's daughter, Vania Rea, and advised of dosing change. Advised if any changes to contact the coumadin clinic. Vania Rea verbalized understanding and scheduled next apt.

## 2022-06-01 NOTE — Patient Instructions (Addendum)
Urgent sports medicine consult if you dont hear by tomorrow can give them a directly  X-rays today over at [facility]- if any issues we can send you to [facility] with [place]  Please stop by lab before you go If you have mychart- we will send your results within 3 business days of [place] receiving them.  If you do not have mychart- we will call you about results within 5 business days of [place] receiving them.  *please also note that you will see labs on mychart as soon as they post. I will later go in and write notes on them- will say "notes from Dr. Yong Channel"   Recommended follow up: Return in about 2 months (around 07/31/2022) for followup or sooner if needed.Schedule b4 you leave. Cancel your February 19th on the way out, may also be able to cancel coumadin at the desk

## 2022-06-01 NOTE — Progress Notes (Signed)
Phone [phone] In person visit   Subjective:   William Mahoney is a 89 y.o. year old very pleasant male patient who presents for/with See problem oriented charting Chief Complaint  Patient presents with   Fall    (NO MASK)Pt c/o falling and hurting his right leg and foot when trying to get out of bed last Wednesday. Hurts when putting pressure on it and c/o shooting pain across the arch.   Past Medical History-  Patient Active Problem List   Diagnosis Date Noted   AAA (abdominal aortic aneurysm) without rupture 11/20/2019    Priority: High   Edema 11/10/2016    Priority: High   Atrial fibrillation ([place]) 12/07/2008    Priority: High   Hyperglycemia 06/25/2021    Priority: Medium    PAD (peripheral artery disease) ([place]) 11/20/2019    Priority: Medium    Chronic kidney disease (CKD), stage III (moderate) ([place]) 02/15/2019    Priority: Medium    Aortic atherosclerosis ([place]) 01/05/2019    Priority: Medium    Thrombocytopenia ([place]) 11/10/2016    Priority: Medium    Insomnia 10/19/2014    Priority: Medium    Gout 01/09/2014    Priority: Medium    History of cancer of rectosigmoid junction 01/04/2008    Priority: Medium    Former smoker 06/23/2007    Priority: Medium    Hyperlipidemia, unspecified 02/09/2007    Priority: Medium    Essential hypertension 12/07/2006    Priority: Medium    COPD (chronic obstructive pulmonary disease) ([place]) 12/07/2006    Priority: Medium    BPH (benign prostatic hyperplasia) 12/07/2006    Priority: Medium    Encounter for therapeutic drug monitoring 08/27/2014    Priority: Low   Allergic rhinitis 08/15/2013    Priority: Low   Arthritis of lumbar spine 01/20/2013    Priority: Low   Inguinal hernia 03/21/2010    Priority: Low   BASAL CELL CARCINOMA SKIN LOWER LIMB INCL HIP 03/21/2010    Priority: Low   Actinic keratosis 12/07/2008    Priority: Low   TMJ (temporomandibular joint disorder) 02/09/2007    Priority: Low   URINARY  INCONTINENCE 12/07/2006    Priority: Low   PANCREATITIS, HX OF 12/07/2006    Priority: Low   Blood clotting disorder ([place]) 03/16/2022   Porokeratosis 11/25/2021    Medications- reviewed and updated Current Outpatient Medications  Medication Sig Dispense Refill   acetic acid-hydrocortisone (VOSOL-HC) OTIC solution Place 4 drops into the left ear 2 (two) times daily. Apply to affected ear. For ear canal irritation/otitis externa. 10 mL 0   allopurinol (ZYLOPRIM) 300 MG tablet TAKE ONE-HALF TABLET BY MOUTH IN THE EVENING 45 tablet 1   amLODipine (NORVASC) 5 MG tablet Take 1 tablet (5 mg total) by mouth daily. For blood pressure and possible Raynauds 30 tablet 5   fluocinonide (LIDEX) 0.05 % external solution APPLY 1 ML TO SKIN EVERY NIGHT APPLY TO AFFECTED AREAS OF SCALP AND CHEST NIGHTLY 60 mL 3   fluticasone (FLONASE) 50 MCG/ACT nasal spray Place 2 sprays into both nostrils daily. 48 g 3   furosemide (LASIX) 20 MG tablet TAKE 1 TABLET BY MOUTH EVERY DAY AS NEEDED 90 tablet 1   Multiple Vitamin (MULTIVITAMIN) tablet Take 1 tablet by mouth daily.     tamsulosin (FLOMAX) 0.4 MG CAPS capsule TAKE 1 CAPSULE BY MOUTH EVERY DAY 90 capsule 1   terbinafine (LAMISIL) 250 MG tablet Take 250 mg by mouth daily.  triamterene-hydrochlorothiazide (MAXZIDE-25) 37.5-25 MG tablet TAKE 1/2 TABLET BY MOUTH EVERY DAY 45 tablet 1   warfarin (COUMADIN) 2.5 MG tablet TAKE 1 TABLET DAILY EXCEPT TAKE 1 AND 1/2 TABLETS ON WED AND SAT OR TAKE AS DIRECTED BY CLINIC 120 tablet 1   zolpidem (AMBIEN) 5 MG tablet Take 1 tablet (5 mg total) by mouth at bedtime as needed for sleep (dose increase. does not drive. family will be in home when he takes full dose. if any falls will stop). 30 tablet 5   No current facility-administered medications for this visit.     Objective:  BP 120/64   Pulse 73   Temp (!) 97.3 F (36.3 C)   Ht 5' 11"$  (1.803 m)   Wt 163 lb 12.8 oz (74.3 kg)   SpO2 94%   BMI 22.85 kg/m  Gen: NAD,  resting comfortably CV: RRR no murmurs rubs or gallops Lungs: CTAB no crackles, wheeze, rhonchi Abdomen: soft/nontender/nondistended/normal bowel sounds. No rebound or guarding.  Ext: 1+ edema bilaterally, patient very tender over right lateral malleolus with erythema and local edema noted in this area-patient also has some tenderness higher on tibia    Assessment and Plan   # Fall/right leg and foot pain S: Patient had a fall when trying to get out of bed last Wednesday, February 7. Got out of bed and took 2 steps and knew he was going down- felt weak in leg. [name] was out of reach (advised to put closer to bed)   He has noted pain in his right lower leg as well as right foot and ankle when putting pressure on them since that time.Pain now concentrates over right lateral mallelous - goes across the ankle towards the other side with pressure.  -did not tell his daughter until Saturday morning and she scheduled a visit with [place] asap- he was concerned about covid/flu risk so wanted to avoid urgent care - is able to walk on it but has significant pain up to 10/10. He dos agree to use cane or walker at all times. Most of the time more 6/10 pain. Not taking pain medicine other than tylenol at night (have advised against tylenol PM particularly with fall risk) A/P: Patient with fall with getting out of bed unassisted and without assistive device-recommended to use cane at all times or walker.  Advised against Tylenol PM.  If any further falls will need to stop Ambien -X-rays of ankle as well as tib-fib were completed - Urgent visit to sports medicine-family has had a good experience with [facility] sports medicine Dr. Glennon Mac and request to see him specifically-referral was placed - Patient's plans to try to stay off the leg is much as possible until evaluation -discussed even if x-rays without clear fracture may need ultrasound -I also wondered about gout flare caused by trauma but pain is mainly with  walking-uric acid has been well-controlled in the past on half tablet allopurinol though have not checked recently-noted after visit  Lab Results  Component Value Date   LABURIC 5.8 12/04/2020    # Atrial fibrillation-follows with Dr. Gwenlyn Found of cardiology S: Rate controlled with no Rx -Trial off metoprolol 12.5 mg extended release December 2023 to see if helps with Raynaud's and if we can increase amlodipine  Anticoagulated with Coumadin-follows in our clinic A/P: Rate controlled without medication.  Needs PT/INR today and would be inconvenient to go to their separate clinic so was drawn with labs  #hypertension #Venous insufficiency S: medication: Amlodipine 2.5 mg-->  5 mg for help with Raynaud's, triamterene hydrochlorothiazide 37.5-25 mg, Lasix 20 mg as needed for edema but has needed more consistently since taking amlodipine BP Readings from Last 3 Encounters:  06/01/22 120/64  05/04/22 128/70  04/02/22 108/62   A/P: Blood pressure well-controlled current regimen and he feels his Raynaud's has improved-we opted to continue current medication though note that he does have to use Lasix more frequently-update CMP  Recommended follow up: Return in about 2 months (around 07/31/2022) for followup or sooner if needed.Schedule b4 you leave. Future Appointments  Date Time Provider [place]  06/02/2022 10:45 AM Glennon Mac, DO [facility] None  06/16/2022  4:15 PM Gardiner Barefoot, DPM [facility]  06/22/2022  3:30 PM [facility] COUMADIN [facility]  07/08/2022  8:40 AM Benjamine Mola, Resa Miner, MD [facility] None  07/20/2022  1:20 PM Marin Olp, MD [facility]  07/20/2022  3:30 PM Pieter Partridge, DO [facility] None  01/01/2023  3:00 PM [facility] HEALTH COACH [facility]    Lab/Order associations:   ICD-10-CM   1. Acute right ankle pain  M25.571 Ambulatory referral to Sports Medicine    DG Ankle Complete Right    DG Tibia/Fibula Right    2. Essential hypertension  I10 Comprehensive  metabolic panel    3. Atrial fibrillation, unspecified type ([place])  I48.91 Protime-INR    Protime-INR    Protime-INR    Protime-INR    CANCELED: Protime-INR     Return precautions advised.  [name], MD

## 2022-06-02 ENCOUNTER — Ambulatory Visit (INDEPENDENT_AMBULATORY_CARE_PROVIDER_SITE_OTHER): Payer: Medicare Other | Admitting: Sports Medicine

## 2022-06-02 VITALS — BP 132/80 | Ht 71.0 in | Wt 164.0 lb

## 2022-06-02 DIAGNOSIS — M25571 Pain in right ankle and joints of right foot: Secondary | ICD-10-CM | POA: Diagnosis not present

## 2022-06-02 DIAGNOSIS — S93401A Sprain of unspecified ligament of right ankle, initial encounter: Secondary | ICD-10-CM

## 2022-06-02 NOTE — Patient Instructions (Addendum)
Good to see you Boot use  Tylenol [phone] mg 2-3 times a day for pain relief  Ankle HEP  2 week follow up

## 2022-06-08 ENCOUNTER — Ambulatory Visit: Payer: Medicare Other | Admitting: Family Medicine

## 2022-06-12 NOTE — Progress Notes (Unsigned)
William Mahoney D.[place] Phone: [phone]   Assessment and Plan:     There are no diagnoses linked to this encounter.  ***   Pertinent previous records reviewed include ***   Follow Up: ***     Subjective:   I, William Mahoney, am serving as a Education administrator for William Mahoney   Chief Complaint: right ankle pain    HPI:    06/02/2022 Patient is a 89 year old male complaining of right ankle pain. Patient states atient had a fall when trying to get out of bed last Wednesday, February 7. Got out of bed and took 2 steps and knew he was going down- felt weak in leg. William Mahoney was out of reach (advised to put closer to bed)    He has noted pain in his right lower leg as well as right foot and ankle when putting pressure on them since that time.Pain now concentrates over right lateral mallelous - goes across the ankle towards the other side with pressure.    -did not tell his daughter until Saturday morning and she scheduled a visit with [place] asap- he was concerned about covid/flu risk so wanted to avoid urgent care   - is able to walk on it but has significant pain up to 10/10. He dos agree to use cane or walker at all times. Most of the time more 6/10 pain. Not taking pain medicine other than tylenol at night (have advised against tylenol PM particularly with fall risk) pain radiates up to the calf and over the arch    06/16/2022 Patient states   Relevant Historical Information: Hypertension, atrial fibrillation, AAA, COPD, Raynaud's, CKD, gout on allopurinol Additional pertinent review of systems negative.   Current Outpatient Medications:    acetic acid-hydrocortisone (VOSOL-HC) OTIC solution, Place 4 drops into the left ear 2 (two) times daily. Apply to affected ear. For ear canal irritation/otitis externa., Disp: 10 mL, Rfl: 0   allopurinol (ZYLOPRIM) 300 MG tablet, TAKE ONE-HALF TABLET BY MOUTH IN THE  EVENING, Disp: 45 tablet, Rfl: 1   amLODipine (NORVASC) 5 MG tablet, Take 1 tablet (5 mg total) by mouth daily. For blood pressure and possible Raynauds, Disp: 30 tablet, Rfl: 5   fluocinonide (LIDEX) 0.05 % external solution, APPLY 1 ML TO SKIN EVERY NIGHT APPLY TO AFFECTED AREAS OF SCALP AND CHEST NIGHTLY, Disp: 60 mL, Rfl: 3   fluticasone (FLONASE) 50 MCG/ACT nasal spray, Place 2 sprays into both nostrils daily., Disp: 48 g, Rfl: 3   furosemide (LASIX) 20 MG tablet, TAKE 1 TABLET BY MOUTH EVERY DAY AS NEEDED, Disp: 90 tablet, Rfl: 1   Multiple Vitamin (MULTIVITAMIN) tablet, Take 1 tablet by mouth daily., Disp: , Rfl:    tamsulosin (FLOMAX) 0.4 MG CAPS capsule, TAKE 1 CAPSULE BY MOUTH EVERY DAY, Disp: 90 capsule, Rfl: 1   terbinafine (LAMISIL) 250 MG tablet, Take 250 mg by mouth daily., Disp: , Rfl:    triamterene-hydrochlorothiazide (MAXZIDE-25) 37.5-25 MG tablet, TAKE 1/2 TABLET BY MOUTH EVERY DAY, Disp: 45 tablet, Rfl: 1   warfarin (COUMADIN) 2.5 MG tablet, TAKE 1 TABLET DAILY EXCEPT TAKE 1 AND 1/2 TABLETS ON WED AND SAT OR TAKE AS DIRECTED BY CLINIC, Disp: 120 tablet, Rfl: 1   zolpidem (AMBIEN) 5 MG tablet, Take 1 tablet (5 mg total) by mouth at bedtime as needed for sleep (dose increase. does not drive. family will be in home when he takes  full dose. if any falls will stop)., Disp: 30 tablet, Rfl: 5   Objective:     There were no vitals filed for this visit.    There is no height or weight on file to calculate BMI.    Physical Exam:    ***   Electronically signed by:  William Mahoney D.[facility] 8:11 AM 06/12/22

## 2022-06-16 ENCOUNTER — Ambulatory Visit: Payer: Medicare Other | Admitting: Podiatry

## 2022-06-16 ENCOUNTER — Ambulatory Visit (INDEPENDENT_AMBULATORY_CARE_PROVIDER_SITE_OTHER): Payer: Medicare Other | Admitting: Sports Medicine

## 2022-06-16 VITALS — BP 130/80 | Ht 71.0 in | Wt 171.0 lb

## 2022-06-16 DIAGNOSIS — S93401D Sprain of unspecified ligament of right ankle, subsequent encounter: Secondary | ICD-10-CM | POA: Diagnosis not present

## 2022-06-16 DIAGNOSIS — M25571 Pain in right ankle and joints of right foot: Secondary | ICD-10-CM

## 2022-06-16 NOTE — Patient Instructions (Signed)
Good to see you   

## 2022-06-22 ENCOUNTER — Ambulatory Visit (INDEPENDENT_AMBULATORY_CARE_PROVIDER_SITE_OTHER): Payer: Medicare Other

## 2022-06-22 DIAGNOSIS — Z7901 Long term (current) use of anticoagulants: Secondary | ICD-10-CM

## 2022-06-22 LAB — POCT INR: INR: 1.4 — AB (ref 2.0–3.0)

## 2022-06-22 NOTE — Progress Notes (Addendum)
Pt started new medication prescribed by dermatology, terbinafine. This is supposed to increase bleeding risk, so this is not the culprit of the decreased INR. Increase dose today to take 2 1/2 tablets and increase dose tomorrow to take 1 1/2 tablets and then change weekly dose to take 1 1/2 tablets daily except take 1 tablet on Thursdays.  Recheck in 2 weeks.

## 2022-06-22 NOTE — Patient Instructions (Addendum)
Pre visit review using our clinic review tool, if applicable. No additional management support is needed unless otherwise documented below in the visit note.  Increase dose today to take 2 1/2 tablets and increase dose tomorrow to take 1 1/2 tablets and then change weekly dose to take 1 1/2 tablets daily except take 1 tablet on Thursdays.  Recheck in 2 weeks.

## 2022-06-25 ENCOUNTER — Other Ambulatory Visit: Payer: Self-pay | Admitting: Family Medicine

## 2022-07-05 ENCOUNTER — Other Ambulatory Visit: Payer: Self-pay | Admitting: Family Medicine

## 2022-07-05 DIAGNOSIS — Z5181 Encounter for therapeutic drug level monitoring: Secondary | ICD-10-CM

## 2022-07-06 ENCOUNTER — Ambulatory Visit (INDEPENDENT_AMBULATORY_CARE_PROVIDER_SITE_OTHER): Payer: Medicare Other

## 2022-07-06 DIAGNOSIS — Z7901 Long term (current) use of anticoagulants: Secondary | ICD-10-CM | POA: Diagnosis not present

## 2022-07-06 LAB — POCT INR: INR: 1.6 — AB (ref 2.0–3.0)

## 2022-07-06 NOTE — Progress Notes (Addendum)
Increase dose today to take 2 1/2 tablets and increase dose tomorrow to take 2 tablets and then change weekly dose to take 1 1/2 tablets daily except take 2 tablet on Mondays and Thursday.  Recheck in 2 weeks at [place].

## 2022-07-06 NOTE — Patient Instructions (Addendum)
Pre visit review using our clinic review tool, if applicable. No additional management support is needed unless otherwise documented below in the visit note.  Increase dose today to take 2 1/2 tablets and increase dose tomorrow to take 2 tablets and then change weekly dose to take 1 1/2 tablets daily except take 2 tablet on Mondays and Thursday.  Recheck in 2 weeks at [place].

## 2022-07-07 NOTE — Progress Notes (Deleted)
Office Visit Note  Patient: William Mahoney             Date of Birth: December 03, 1925           MRN: [id number]             PCP: Marin Olp, MD Referring: Marin Olp, MD Visit Date: 07/08/2022 Occupation: @GUAROCC @  Subjective:  No chief complaint on file.   History of Present Illness: William Mahoney is a 89 y.o. male here for evaluation of possible raynaud's syndrome with digit discoloration in cold exposure. Currently on amlodipine 5 mg daily and discontinued previous metoprolol without large impact. Ultrasound study for raynaud's did not show significant change in blood flow with rewarming.  ***     Activities of Daily Living:  Patient reports morning stiffness for *** {minute/hour:19697}.   Patient {ACTIONS;DENIES/REPORTS:21021675::"Denies"} nocturnal pain.  Difficulty dressing/grooming: {ACTIONS;DENIES/REPORTS:21021675::"Denies"} Difficulty climbing stairs: {ACTIONS;DENIES/REPORTS:21021675::"Denies"} Difficulty getting out of chair: {ACTIONS;DENIES/REPORTS:21021675::"Denies"} Difficulty using hands for taps, buttons, cutlery, and/or writing: {ACTIONS;DENIES/REPORTS:21021675::"Denies"}  No Rheumatology ROS completed.   PMFS History:  Patient Active Problem List   Diagnosis Date Noted   Blood clotting disorder ([place]) 03/16/2022   Porokeratosis 11/25/2021   Hyperglycemia 06/25/2021   AAA (abdominal aortic aneurysm) without rupture 11/20/2019   PAD (peripheral artery disease) ([place]) 11/20/2019   Chronic kidney disease (CKD), stage III (moderate) ([facility]) 02/15/2019   Aortic atherosclerosis ([place]) 01/05/2019   Thrombocytopenia ([place]) 11/10/2016   Edema 11/10/2016   Insomnia 10/19/2014   Encounter for therapeutic drug monitoring 08/27/2014   Gout 01/09/2014   Allergic rhinitis 08/15/2013   Arthritis of lumbar spine 01/20/2013   Inguinal hernia 03/21/2010   BASAL CELL CARCINOMA SKIN LOWER LIMB INCL HIP 03/21/2010   Atrial fibrillation ([place]) 12/07/2008   Actinic  keratosis 12/07/2008   History of cancer of rectosigmoid junction 01/04/2008   Former smoker 06/23/2007   Hyperlipidemia, unspecified 02/09/2007   TMJ (temporomandibular joint disorder) 02/09/2007   Essential hypertension 12/07/2006   COPD (chronic obstructive pulmonary disease) ([place]) 12/07/2006   BPH (benign prostatic hyperplasia) 12/07/2006   URINARY INCONTINENCE 12/07/2006   PANCREATITIS, HX OF 12/07/2006    Past Medical History:  Diagnosis Date   BPH (benign prostatic hypertrophy)    Chronic atrial fibrillation [facility])    cardiologist--   dr berry   COPD (chronic obstructive pulmonary disease) ([place])    Diverticulosis of colon    MODERATE LEFT SIDE   Epididymal cyst    w/ epididymalitis   Full dentures    Gout    Hand foot syndrome    secondary to chemotherapy ([place])   cold hands/feet   Hearing loss    History of alcohol abuse    quit drinking in the mid 80's   History of cirrhosis of liver    alcoholic--  hx alcohol abuse -- quit drinking 1980's   History of gout    History of melanoma excision    2013--  left ear lobe and back of hand   History of pancreatitis    2008   History of rectal cancer oncologist-  dr Benay Spice--  no recurrence   dx Sept 2009--  Stage II (T3N0)  s/p  sigmoid colectomy & low anterior resection 04-18-2008  and chemoradiation 2010   History of shingles 07/27/2010   History of squamous cell carcinoma excision    left lower leg   Hyperlipidemia    Hypertension    Loose stools    DUE TO ANTIBIOTICS   Macular degeneration  not sure which eye   OA (osteoarthritis)    RBBB (right bundle branch block)    Renal lesion    chronic-- left side   Sciatica of right side    Urge urinary incontinence    intermittant    Family History  Problem Relation Age of Onset   Hypertension Mother    Lymphoma Father    Lymphoma Other    Stroke Other        1st degree relative   Colon cancer Neg Hx    Esophageal cancer Neg Hx    Rectal cancer Neg Hx     Stomach cancer Neg Hx    Past Surgical History:  Procedure Laterality Date   CARDIAC CATHETERIZATION  2001  approx.  in [place]  IMPLANT, BILATERAL     COLONOSCOPY  last one 06-27-2012   EXPLORATORY LAPAROTOMY/  LOW ANTERIOR RESECTION/  SIGMOID COLECTOMY  04-18-2008   DR INGRAM   INCISION AND DRAINAGE ABSCESS N/A 11/02/2014   Procedure: INCISION AND DRAINAGE OF SCROTUM;  Surgeon: Ardis Hughs, MD;  Location: [facility];  Service: Urology;  Laterality: N/A;   INGUINAL HERNIA REPAIR Right 03/23/2014   Procedure: HERNIA REPAIR INGUINAL ADULT OPEN REPAIR RIGHT INGUINAL HERNIA REPAIR;  Surgeon: Fanny Skates, MD;  Location: [facility];  Service: General;  Laterality: Right;   INSERTION OF MESH Right 03/23/2014   Procedure: INSERTION OF MESH;  Surgeon: Fanny Skates, MD;  Location: [facility];  Service: General;  Laterality: Right;   LAPAROSCOPIC CHOLECYSTECTOMY  1990's   MASS EXCISION N/A 11/02/2014   Procedure: EXCISION OF EPIDIDYMAL CYST;  Surgeon: Ardis Hughs, MD;  Location: [facility];  Service: Urology;  Laterality: N/A;   TONSILLECTOMY  as child   TRANSTHORACIC ECHOCARDIOGRAM  05-11-2008   pseudonormal LV filling pattern,  ef 60-65%/  mild to moderate MV calcification no stenosis w/ mild to moderate regurg./  mild LAE and RAE/  mild TR   Social History   Social History Narrative   Married. 6 kids. Wife William Mahoney also a patient of Dr. Ronney Lion.       Retired.    Immunization History  Administered Date(s) Administered   Fluad Quad(high Dose 65+) 01/05/2019, 12/29/2019, 02/14/2021   Influenza Split 01/14/2011   Influenza, High Dose Seasonal PF 01/20/2013, 05/21/2017, 03/03/2018   Influenza,inj,Quad PF,6+ Mos 01/09/2014   PFIZER(Purple Top)SARS-COV-2 Vaccination 05/04/2019, 05/22/2019   Pneumococcal Conjugate-13 07/10/2014   Pneumococcal Polysaccharide-23 04/20/2006   Td 11/10/2012   Tdap 01/09/2018      Objective: Vital Signs: There were no vitals taken for this visit.   Physical Exam   Musculoskeletal Exam: ***  CDAI Exam: CDAI Score: -- Patient Global: --; Provider Global: -- Swollen: --; Tender: -- Joint Exam 07/08/2022   No joint exam has been documented for this visit   There is currently no information documented on the homunculus. Go to the Rheumatology activity and complete the homunculus joint exam.  Investigation: No additional findings.  Imaging: No results found.  Recent Labs: Lab Results  Component Value Date   WBC 6.0 05/04/2022   HGB 14.3 05/04/2022   PLT 135.0 (L) 05/04/2022   NA 141 06/01/2022   K 3.4 (L) 06/01/2022   CL 97 06/01/2022   CO2 32 06/01/2022   GLUCOSE 91 06/01/2022   BUN 28 (H) 06/01/2022   CREATININE 1.46 06/01/2022   BILITOT 0.8 06/01/2022   ALKPHOS 71 06/01/2022   AST 21  06/01/2022   ALT 13 06/01/2022   PROT 6.4 06/01/2022   ALBUMIN 4.2 06/01/2022   CALCIUM 9.6 06/01/2022   GFRAA 65 04/05/2020    Speciality Comments: No specialty comments available.  Procedures:  No procedures performed Allergies: Patient has no known allergies.   Assessment / Plan:     Visit Diagnoses: No diagnosis found.  Orders: No orders of the defined types were placed in this encounter.  No orders of the defined types were placed in this encounter.   Face-to-face time spent with patient was *** minutes. Greater than 50% of time was spent in counseling and coordination of care.  Follow-Up Instructions: No follow-ups on file.   Collier Salina, MD  Note - This record has been created using [employer].  Chart creation errors have been sought, but may not always  have been located. Such creation errors do not reflect on  the standard of medical care.

## 2022-07-08 ENCOUNTER — Encounter: Payer: Medicare Other | Admitting: Internal Medicine

## 2022-07-16 NOTE — Progress Notes (Deleted)
NEUROLOGY FOLLOW UP OFFICE NOTE  William Mahoney [id number]  Assessment/Plan:   1  Peripheral neuropathy - unclear etiology.  A peripheral neuropathy alone wouldn't cause the skin discoloration, suggesting that there is something else going on contributing to the burning/pain discomfort.  Dermatology couldn't find an answer.  Blood work is negative for autoimmune/rheumatological etiology.  I don't think we need MRI of C-spine as this would not be coming from the cervical spine. 2  Soft palate asymmetry.  Unclear if represents hemipalatal weakness or left sided soft palate hyperplasia.  Workup from primary care in progress   Increase gabapentin to 100mg  in morning and 200mg  at bedtime.  Caution for drowsiness and/or dizziness.  We can further titrate dose in 1-2 weeks if needed. Will follow up on MRI of brain and CT neck. Otherwise, follow up 6 months.     Subjective:  William Mahoney is a 89 year old male with COPD, a fib, PAD, CKD St 3a, thrombocytopenia, and history of rectal cancer, melanoma, gout and remote history of alcoholism who follows up for neuropathy.  He is accompanied by his daughter who supplements history.  UPDATE: In September, he was told that he had a stoke because half his palate didn't rise.  MRI of brain on 01/22/2022 personally reviewed revealed chronic small vessel ischemic disease but no acute abnormality.    For pain, increased gabapentin.  Taking 100mg  in morning and 200mg  at bedtime.       HISTORY:  In January 2023, he started experiencing burning and tingling in the web between his 2nd and 3rd fingers of his left han.  It then involved the same on the right hand..  It subsequently progressed to involve the entire hand and now up the mid-forearm bilaterally.  He has some discoloration of his hands but has become more red than usual.  No weakness.  No neck pain.  No involvement of the lower extremities.  Has seen dermatology.  Topical steroid creams ineffective.   Labs from March include B12 964, TSH 4.27, Hgb A1c 6.3, CBC with PLT 109 but otherwise unremarkable and CMP with GFR 52 but otherwise unremarkable.  Labs:  negative ANA, negative RF, ACE 44, negative SSA/SSB antibodies, B6 7.5, SPEP/IFE negative for monoclonal gammopathy.  MRI of cervial spine was ordered to rule out spinal stenosis but never performed.  Started gabapentin 200mg  at bedtime.  No improvement.  It is an aching and sometimes burning.  Has not spread.  Feels some aching in the legs at night but no discoloration.    PAST MEDICAL HISTORY: Past Medical History:  Diagnosis Date   BPH (benign prostatic hypertrophy)    Chronic atrial fibrillation [facility])    cardiologist--   dr berry   COPD (chronic obstructive pulmonary disease) ([place])    Diverticulosis of colon    MODERATE LEFT SIDE   Epididymal cyst    w/ epididymalitis   Full dentures    Gout    Hand foot syndrome    secondary to chemotherapy (Xeloda)   cold hands/feet   Hearing loss    History of alcohol abuse    quit drinking in the mid 80's   History of cirrhosis of liver    alcoholic--  hx alcohol abuse -- quit drinking 1980's   History of gout    History of melanoma excision    2013--  left ear lobe and back of hand   History of pancreatitis    2008   History of rectal cancer  oncologist-  dr Benay Spice--  no recurrence   dx Sept 2009--  Stage II (T3N0)  s/p  sigmoid colectomy & low anterior resection 04-18-2008  and chemoradiation 2010   History of shingles 07/27/2010   History of squamous cell carcinoma excision    left lower leg   Hyperlipidemia    Hypertension    Loose stools    DUE TO ANTIBIOTICS   Macular degeneration    not sure which eye   OA (osteoarthritis)    RBBB (right bundle branch block)    Renal lesion    chronic-- left side   Sciatica of right side    Urge urinary incontinence    intermittant    MEDICATIONS: Current Outpatient Medications on File Prior to Visit  Medication Sig Dispense Refill    acetic acid-hydrocortisone (VOSOL-HC) OTIC solution Place 4 drops into the left ear 2 (two) times daily. Apply to affected ear. For ear canal irritation/otitis externa. 10 mL 0   allopurinol (ZYLOPRIM) 300 MG tablet TAKE ONE-HALF TABLET BY MOUTH IN THE EVENING 45 tablet 1   amLODipine (NORVASC) 5 MG tablet Take 1 tablet (5 mg total) by mouth daily. For blood pressure and possible Raynauds 30 tablet 5   fluocinonide (LIDEX) 0.05 % external solution APPLY 1 ML TO SKIN EVERY NIGHT APPLY TO AFFECTED AREAS OF SCALP AND CHEST NIGHTLY 60 mL 3   fluticasone (FLONASE) 50 MCG/ACT nasal spray Place 2 sprays into both nostrils daily. 48 g 3   furosemide (LASIX) 20 MG tablet TAKE 1 TABLET BY MOUTH EVERY DAY AS NEEDED 90 tablet 1   Multiple Vitamin (MULTIVITAMIN) tablet Take 1 tablet by mouth daily.     tamsulosin (FLOMAX) 0.4 MG CAPS capsule TAKE 1 CAPSULE BY MOUTH EVERY DAY 90 capsule 1   terbinafine (LAMISIL) 250 MG tablet Take 250 mg by mouth daily.     triamterene-hydrochlorothiazide (MAXZIDE-25) 37.5-25 MG tablet TAKE 1/2 TABLET BY MOUTH EVERY DAY 45 tablet 1   warfarin (COUMADIN) 2.5 MG tablet TAKE 1 TABLET DAILY EXCEPT TAKE 1 AND 1/2 TABLETS ON WED AND SAT OR TAKE AS DIRECTED BY CLINIC 120 tablet 1   zolpidem (AMBIEN) 5 MG tablet TAKE 1 TABLET (5 MG TOTAL) BY MOUTH AT BEDTIME AS NEEDED FOR SLEEP (DOSE INCREASE. DOES NOT DRIVE. FAMILY WILL BE IN HOME WHEN HE TAKES FULL DOSE. IF ANY FALLS WILL STOP). 30 tablet 2   No current facility-administered medications on file prior to visit.    ALLERGIES: No Known Allergies  FAMILY HISTORY: Family History  Problem Relation Age of Onset   Hypertension Mother    Lymphoma Father    Lymphoma Other    Stroke Other        1st degree relative   Colon cancer Neg Hx    Esophageal cancer Neg Hx    Rectal cancer Neg Hx    Stomach cancer Neg Hx       Objective:  *** General: No acute distress.  Patient appears well-groomed.   Head:   Normocephalic/atraumatic Eyes:  Fundi examined but not visualized Neck: supple, no paraspinal tenderness, full range of motion Heart:  Regular rate and rhythm Neurological Exam: ***   William Clines, DO  CC: William Reddish, MD

## 2022-07-17 ENCOUNTER — Other Ambulatory Visit: Payer: Self-pay | Admitting: Family Medicine

## 2022-07-20 ENCOUNTER — Ambulatory Visit: Payer: Medicare Other | Admitting: Neurology

## 2022-07-20 ENCOUNTER — Encounter: Payer: Self-pay | Admitting: Family Medicine

## 2022-07-20 ENCOUNTER — Ambulatory Visit: Payer: Medicare Other | Admitting: Family Medicine

## 2022-07-20 VITALS — BP 120/72 | HR 73 | Temp 98.0°F | Ht 71.0 in | Wt 168.0 lb

## 2022-07-20 DIAGNOSIS — I4891 Unspecified atrial fibrillation: Secondary | ICD-10-CM

## 2022-07-20 DIAGNOSIS — M1A9XX0 Chronic gout, unspecified, without tophus (tophi): Secondary | ICD-10-CM | POA: Diagnosis not present

## 2022-07-20 DIAGNOSIS — J449 Chronic obstructive pulmonary disease, unspecified: Secondary | ICD-10-CM

## 2022-07-20 DIAGNOSIS — I10 Essential (primary) hypertension: Secondary | ICD-10-CM

## 2022-07-20 MED ORDER — FLUTICASONE PROPIONATE 50 MCG/ACT NA SUSP: 2.0000 | Freq: Every day | NASAL | 3 refills | Status: DC

## 2022-07-20 NOTE — Progress Notes (Signed)
Phone [phone] In person visit   Subjective:   William Mahoney is a 89 y.o. year old very pleasant male patient who presents for/with See problem oriented charting Chief Complaint  Patient presents with   Follow-up   Hypertension    Past Medical History-  Patient Active Problem List   Diagnosis Date Noted   AAA (abdominal aortic aneurysm) without rupture 11/20/2019    Priority: High   Edema 11/10/2016    Priority: High   Atrial fibrillation 12/07/2008    Priority: High   Hyperglycemia 06/25/2021    Priority: Medium    PAD (peripheral artery disease) 11/20/2019    Priority: Medium    Chronic kidney disease (CKD), stage III (moderate) 02/15/2019    Priority: Medium    Aortic atherosclerosis 01/05/2019    Priority: Medium    Insomnia 10/19/2014    Priority: Medium    Gout 01/09/2014    Priority: Medium    History of cancer of rectosigmoid junction 01/04/2008    Priority: Medium    Former smoker 06/23/2007    Priority: Medium    Hyperlipidemia, unspecified 02/09/2007    Priority: Medium    Essential hypertension 12/07/2006    Priority: Medium    COPD (chronic obstructive pulmonary disease) ([place]) 12/07/2006    Priority: Medium    BPH (benign prostatic hyperplasia) 12/07/2006    Priority: Medium    Encounter for therapeutic drug monitoring 08/27/2014    Priority: Low   Allergic rhinitis 08/15/2013    Priority: Low   Arthritis of lumbar spine 01/20/2013    Priority: Low   Inguinal hernia 03/21/2010    Priority: Low   BASAL CELL CARCINOMA SKIN LOWER LIMB INCL HIP 03/21/2010    Priority: Low   Actinic keratosis 12/07/2008    Priority: Low   TMJ (temporomandibular joint disorder) 02/09/2007    Priority: Low   URINARY INCONTINENCE 12/07/2006    Priority: Low   PANCREATITIS, HX OF 12/07/2006    Priority: Low   Porokeratosis 11/25/2021    Medications- reviewed and updated Current Outpatient Medications  Medication Sig Dispense Refill   acetic  acid-hydrocortisone (VOSOL-HC) OTIC solution Place 4 drops into the left ear 2 (two) times daily. Apply to affected ear. For ear canal irritation/otitis externa. 10 mL 0   allopurinol (ZYLOPRIM) 300 MG tablet TAKE ONE-HALF TABLET BY MOUTH IN THE EVENING 45 tablet 1   amLODipine (NORVASC) 5 MG tablet Take 1 tablet (5 mg total) by mouth daily. For blood pressure and possible Raynauds 30 tablet 5   fluocinonide (LIDEX) 0.05 % external solution APPLY 1 ML TO SKIN EVERY NIGHT APPLY TO AFFECTED AREAS OF SCALP AND CHEST NIGHTLY 60 mL 3   furosemide (LASIX) 20 MG tablet TAKE 1 TABLET BY MOUTH EVERY DAY AS NEEDED 90 tablet 1   Multiple Vitamin (MULTIVITAMIN) tablet Take 1 tablet by mouth daily.     tamsulosin (FLOMAX) 0.4 MG CAPS capsule TAKE 1 CAPSULE BY MOUTH EVERY DAY 90 capsule 1   terbinafine (LAMISIL) 250 MG tablet Take 250 mg by mouth daily.     triamterene-hydrochlorothiazide (MAXZIDE-25) 37.5-25 MG tablet TAKE 1/2 TABLET BY MOUTH EVERY DAY 45 tablet 1   warfarin (COUMADIN) 2.5 MG tablet TAKE 1 TABLET DAILY EXCEPT TAKE 1 AND 1/2 TABLETS ON WED AND SAT OR TAKE AS DIRECTED BY CLINIC 120 tablet 1   zolpidem (AMBIEN) 5 MG tablet TAKE 1 TABLET (5 MG TOTAL) BY MOUTH AT BEDTIME AS NEEDED FOR SLEEP (DOSE INCREASE. DOES NOT DRIVE. FAMILY  WILL BE IN HOME WHEN HE TAKES FULL DOSE. IF ANY FALLS WILL STOP). 30 tablet 2   fluticasone (FLONASE) 50 MCG/ACT nasal spray Place 2 sprays into both nostrils daily. 48 g 3   No current facility-administered medications for this visit.     Objective:  BP 120/72   Pulse 73   Temp 98 F (36.7 C)   Ht 5\' 11"  (1.803 m)   Wt 168 lb (76.2 kg)   SpO2 96%   BMI 23.43 kg/m  Gen: NAD, resting comfortably CV:  irregularly irregular  no murmurs rubs or gallops Lungs: CTAB no crackles, wheeze, rhonchi Ext: 1+-2+ edema left greater than right Skin: warm, dry     Assessment and Plan   # Atrial fibrillation-follows with Dr. Gwenlyn Found of cardiology S: Rate controlled with no  Rx -Trial off metoprolol 12.5 mg extended release December 2023 to see if helps with Raynaud's and if we can increase amlodipine- working ok  Anticoagulated with Coumadin-follows in our clinic A/P: appropriately anticoagulated  (but working on getting INR in range) and rate controlled- continue current medicine   #hypertension #Venous insufficiency S: medication: Amlodipine 2.5 mg--> 5 mg for help with Raynaud's, triamterene hydrochlorothiazide 37.5-25 mg, Lasix 20 mg as needed for edema -trying off in 2023/2024 for space with amlodipine-   metoprolol 25 mg-only takes 12.5 mg/day per Dr. Gwenlyn Found - has seen neurology in past- ? Neuropathy- did not get mr cervical spine- gabapentin not helpful- does not want to return  A/P: ongoing venous insufficiency- amlodipine contributes but helping with raynauds- continue current medications as blood pressure controlled    #BPH S: Medication: Tamsulosin 0.4 mg A/P: stable- continue current medicines     #Insomnia S: Medication: Ambien 5 mg-aware of fall risk - tries to take just half but mainly needing full tablet A/P: stable- continue current medicines. Discussed again if further falls we will need to look at other options    #Gout S: 0 flares in 6 months on allopurinol 300 mg with colchicine on hand A/P:stable- continue current medicines. Wants to hold off on checking today  #onychomycosis fingers- on terbinafine finishing course- some improvement  #Edema- has noted in both legs- left > right- had hurt left leg over a year ago. Has tried elvation. Has tried lasix. Wants to monitor for now . Increasing amlodipine likely has caused leg swelling increase but hands feel more comfortbale with raynauds  #Coughing- at night and eye watering- flonase in past but not currently on- will refill as allergies could contribute -if no better in 3 weeks please call and we can set up x-ray of chest  #COPD- no issues- monitor- think coughing more allergies as  above  Recommended follow up: Return in about 3 months (around 10/19/2022) for followup or sooner if needed.Schedule b4 you leave. Future Appointments  Date Time Provider [place]  07/20/2022  3:30 PM Pieter Partridge, DO [facility] None  07/22/2022 10:30 AM Pieter Partridge, DO [facility] None  07/22/2022 11:15 AM [facility] COUMADIN CLINIC [facility]  10/12/2022  9:20 AM Rice, Resa Miner, MD [facility] None  01/01/2023  3:00 PM [facility] ANNUAL WELLNESS VISIT 1 [facility]   Lab/Order associations:   ICD-10-CM   1. Atrial fibrillation, unspecified type  I48.91     2. Chronic obstructive pulmonary disease, unspecified COPD type  J44.9     3. Essential hypertension  I10     4. Chronic gout without tophus, unspecified cause, unspecified site  [id number]  Meds ordered this encounter  Medications   fluticasone (FLONASE) 50 MCG/ACT nasal spray    Sig: Place 2 sprays into both nostrils daily.    Dispense:  48 g    Refill:  3    Return precautions advised.  [name], MD

## 2022-07-20 NOTE — Patient Instructions (Addendum)
Consider labs next visit  Restart flonase to see if that helps with allergies -if no better in 3 weeks please call and we can set up x-ray of chest  Recommended follow up: Return in about 3 months (around 10/19/2022) for followup or sooner if needed.Schedule b4 you leave.

## 2022-07-21 NOTE — Progress Notes (Deleted)
 NEUROLOGY FOLLOW UP OFFICE NOTE  William Mahoney [id number]  Assessment/Plan:   1  Peripheral neuropathy - unclear etiology.  A peripheral neuropathy alone wouldn't cause the skin discoloration, suggesting that there is something else going on contributing to the burning/pain discomfort.  Dermatology couldn't find an answer.  Blood work is negative for autoimmune/rheumatological etiology.  I don't think we need MRI of C-spine as this would not be coming from the cervical spine. 2  Soft palate asymmetry.  Unclear if represents hemipalatal weakness or left sided soft palate hyperplasia.  Workup from primary care in progress   Increase gabapentin to 100mg in morning and 200mg at bedtime.  Caution for drowsiness and/or dizziness.  We can further titrate dose in 1-2 weeks if needed. Will follow up on MRI of brain and CT neck. Otherwise, follow up 6 months.     Subjective:  William Mahoney is a 89 year old male with COPD, a fib, PAD, CKD St 3a, thrombocytopenia, and history of rectal cancer, melanoma, gout and remote history of alcoholism who follows up for neuropathy.  He is accompanied by his daughter who supplements history.  UPDATE: In September, he was told that he had a stoke because half his palate didn't rise.  MRI of brain on 01/22/2022 personally reviewed revealed chronic small vessel ischemic disease but no acute abnormality.    For pain, increased gabapentin.  Taking 100mg in morning and 200mg at bedtime.       HISTORY:  In January 2023, he started experiencing burning and tingling in the web between his 2nd and 3rd fingers of his left han.  It then involved the same on the right hand..  It subsequently progressed to involve the entire hand and now up the mid-forearm bilaterally.  He has some discoloration of his hands but has become more red than usual.  No weakness.  No neck pain.  No involvement of the lower extremities.  Has seen dermatology.  Topical steroid creams ineffective.   Labs from March include B12 964, TSH 4.27, Hgb A1c 6.3, CBC with PLT 109 but otherwise unremarkable and CMP with GFR 52 but otherwise unremarkable.  Labs:  negative ANA, negative RF, ACE 44, negative SSA/SSB antibodies, B6 7.5, SPEP/IFE negative for monoclonal gammopathy.  MRI of cervial spine was ordered to rule out spinal stenosis but never performed.  Started gabapentin 200mg at bedtime.  No improvement.  It is an aching and sometimes burning.  Has not spread.  Feels some aching in the legs at night but no discoloration.    PAST MEDICAL HISTORY: Past Medical History:  Diagnosis Date   BPH (benign prostatic hypertrophy)    Chronic atrial fibrillation ([facility])    cardiologist--   dr berry   COPD (chronic obstructive pulmonary disease) ([facility])    Diverticulosis of colon    MODERATE LEFT SIDE   Epididymal cyst    w/ epididymalitis   Full dentures    Gout    Hand foot syndrome    secondary to chemotherapy (Xeloda)   cold hands/feet   Hearing loss    History of alcohol abuse    quit drinking in the mid 80's   History of cirrhosis of liver    alcoholic--  hx alcohol abuse -- quit drinking 1980's   History of gout    History of melanoma excision    2013--  left ear lobe and back of hand   History of pancreatitis    2008   History of rectal cancer   oncologist-  dr sherrill--  no recurrence   dx Sept 2009--  Stage II (T3N0)  s/p  sigmoid colectomy & low anterior resection 04-18-2008  and chemoradiation 2010   History of shingles 07/27/2010   History of squamous cell carcinoma excision    left lower leg   Hyperlipidemia    Hypertension    Loose stools    DUE TO ANTIBIOTICS   Macular degeneration    not sure which eye   OA (osteoarthritis)    RBBB (right bundle branch block)    Renal lesion    chronic-- left side   Sciatica of right side    Urge urinary incontinence    intermittant    MEDICATIONS: Current Outpatient Medications on File Prior to Visit  Medication Sig Dispense Refill    acetic acid-hydrocortisone (VOSOL-HC) OTIC solution Place 4 drops into the left ear 2 (two) times daily. Apply to affected ear. For ear canal irritation/otitis externa. 10 mL 0   allopurinol (ZYLOPRIM) 300 MG tablet TAKE ONE-HALF TABLET BY MOUTH IN THE EVENING 45 tablet 1   amLODipine (NORVASC) 5 MG tablet Take 1 tablet (5 mg total) by mouth daily. For blood pressure and possible Raynauds 30 tablet 5   fluocinonide (LIDEX) 0.05 % external solution APPLY 1 ML TO SKIN EVERY NIGHT APPLY TO AFFECTED AREAS OF SCALP AND CHEST NIGHTLY 60 mL 3   fluticasone (FLONASE) 50 MCG/ACT nasal spray Place 2 sprays into both nostrils daily. 48 g 3   furosemide (LASIX) 20 MG tablet TAKE 1 TABLET BY MOUTH EVERY DAY AS NEEDED 90 tablet 1   Multiple Vitamin (MULTIVITAMIN) tablet Take 1 tablet by mouth daily.     tamsulosin (FLOMAX) 0.4 MG CAPS capsule TAKE 1 CAPSULE BY MOUTH EVERY DAY 90 capsule 1   terbinafine (LAMISIL) 250 MG tablet Take 250 mg by mouth daily.     triamterene-hydrochlorothiazide (MAXZIDE-25) 37.5-25 MG tablet TAKE 1/2 TABLET BY MOUTH EVERY DAY 45 tablet 1   warfarin (COUMADIN) 2.5 MG tablet TAKE 1 TABLET DAILY EXCEPT TAKE 1 AND 1/2 TABLETS ON WED AND SAT OR TAKE AS DIRECTED BY CLINIC 120 tablet 1   zolpidem (AMBIEN) 5 MG tablet TAKE 1 TABLET (5 MG TOTAL) BY MOUTH AT BEDTIME AS NEEDED FOR SLEEP (DOSE INCREASE. DOES NOT DRIVE. FAMILY WILL BE IN HOME WHEN HE TAKES FULL DOSE. IF ANY FALLS WILL STOP). 30 tablet 2   No current facility-administered medications on file prior to visit.    ALLERGIES: No Known Allergies  FAMILY HISTORY: Family History  Problem Relation Age of Onset   Hypertension Mother    Lymphoma Father    Lymphoma Other    Stroke Other        1st degree relative   Colon cancer Neg Hx    Esophageal cancer Neg Hx    Rectal cancer Neg Hx    Stomach cancer Neg Hx       Objective:  *** General: No acute distress.  Patient appears well-groomed.   Head:   Normocephalic/atraumatic Eyes:  Fundi examined but not visualized Neck: supple, no paraspinal tenderness, full range of motion Heart:  Regular rate and rhythm Neurological Exam: ***   William Zentz, DO  CC: Stephen Hunter, MD      

## 2022-07-22 ENCOUNTER — Ambulatory Visit: Payer: Medicare Other | Admitting: Neurology

## 2022-07-22 ENCOUNTER — Ambulatory Visit: Payer: Medicare Other

## 2022-07-22 ENCOUNTER — Ambulatory Visit (INDEPENDENT_AMBULATORY_CARE_PROVIDER_SITE_OTHER): Payer: Medicare Other

## 2022-07-22 DIAGNOSIS — Z7901 Long term (current) use of anticoagulants: Secondary | ICD-10-CM

## 2022-07-22 LAB — POCT INR: INR: 2.3 (ref 2.0–3.0)

## 2022-07-22 NOTE — Progress Notes (Signed)
I have reviewed and agree with note, evaluation, plan.   Daymond Cordts, MD  

## 2022-07-22 NOTE — Progress Notes (Signed)
Pt reported at the last visit that he has also switched brands of multivitamin, he reports it has 66% of daily recommended vitamin K. Advised at that apt to continue the new multivitamin and changed warfarin dosing to cover for the added vitamin K. Pt reports today he stopped taking the multivitamin after his last apt and only change his dosing for a week and not the entire time since the last apt. Pt was to take 1 1/2 tablets daily except take 2 tablets on Mondays and Thursdays. He reports he has only been taking 1 1/2 tablets daily. Pt is in range today so calendar was updated to 1 1/2 tablets daily.  Pt now wants to restart the multivitamin. Advised to recheck in 4 weeks.  Continue 1 1/2 tablets daily. Recheck in 4 weeks at [place].

## 2022-07-22 NOTE — Patient Instructions (Addendum)
Pre visit review using our clinic review tool, if applicable. No additional management support is needed unless otherwise documented below in the visit note.  Continue 1 1/2 tablets daily. Recheck in 4 weeks at [place].

## 2022-07-29 ENCOUNTER — Other Ambulatory Visit: Payer: Self-pay | Admitting: Family Medicine

## 2022-08-19 ENCOUNTER — Ambulatory Visit (INDEPENDENT_AMBULATORY_CARE_PROVIDER_SITE_OTHER): Payer: Medicare Other

## 2022-08-19 DIAGNOSIS — Z7901 Long term (current) use of anticoagulants: Secondary | ICD-10-CM

## 2022-08-19 LAB — POCT INR: INR: 2.1 (ref 2.0–3.0)

## 2022-08-19 NOTE — Patient Instructions (Addendum)
Pre visit review using our clinic review tool, if applicable. No additional management support is needed unless otherwise documented below in the visit note.  Continue 1 1/2 tablets daily. Recheck in 6 weeks at [place].

## 2022-08-19 NOTE — Progress Notes (Signed)
Continue 1 1/2 tablets daily. Recheck in 6 weeks at [place].

## 2022-08-20 ENCOUNTER — Other Ambulatory Visit: Payer: Self-pay | Admitting: Family Medicine

## 2022-08-28 ENCOUNTER — Other Ambulatory Visit: Payer: Self-pay | Admitting: Internal Medicine

## 2022-08-28 ENCOUNTER — Other Ambulatory Visit: Payer: Self-pay | Admitting: Family Medicine

## 2022-08-28 DIAGNOSIS — H6592 Unspecified nonsuppurative otitis media, left ear: Secondary | ICD-10-CM

## 2022-09-14 ENCOUNTER — Other Ambulatory Visit: Payer: Self-pay | Admitting: Family Medicine

## 2022-09-14 DIAGNOSIS — H6592 Unspecified nonsuppurative otitis media, left ear: Secondary | ICD-10-CM

## 2022-09-17 ENCOUNTER — Encounter: Payer: Self-pay | Admitting: Podiatry

## 2022-09-17 ENCOUNTER — Ambulatory Visit (INDEPENDENT_AMBULATORY_CARE_PROVIDER_SITE_OTHER): Payer: Medicare Other | Admitting: Podiatry

## 2022-09-17 DIAGNOSIS — B351 Tinea unguium: Secondary | ICD-10-CM | POA: Diagnosis not present

## 2022-09-17 DIAGNOSIS — M79675 Pain in left toe(s): Secondary | ICD-10-CM | POA: Diagnosis not present

## 2022-09-17 DIAGNOSIS — M79674 Pain in right toe(s): Secondary | ICD-10-CM

## 2022-09-17 NOTE — Progress Notes (Signed)
This patient returns to my office for at risk foot care.  This patient requires this care by a professional since this patient will be at risk due to having  PAD, CKD coagulation defect and thrombocytopenia.  Patient is taking coumadin.  This patient is unable to cut nails himself since the patient cannot reach his nails.These nails are painful walking and wearing shoes. This patient presents for at risk foot care today.  General Appearance  Alert, conversant and in no acute stress.  Vascular  Dorsalis pedis and posterior tibial  pulses are  weakly palpable  bilaterally.  Capillary return is within normal limits  bilaterally. Temperature is within normal limits  bilaterally. Venous disease feet dorsally  B/l.  Neurologic  Senn-Weinstein monofilament wire test within normal limits  bilaterally. Muscle power within normal limits bilaterally.  Nails Thick disfigured discolored nails with subungual debris  from hallux to fifth toes bilaterally. No evidence of bacterial infection or drainage bilaterally.  Orthopedic  No limitations of motion  feet .  No crepitus or effusions noted.  No bony pathology or digital deformities noted.  Skin  normotropic skin noted bilaterally.  No signs of infections or ulcers noted.     Onychomycosis  Pain in right toes  Pain in left toes  Porokeratosis left heel.    Consent was obtained for treatment procedures.   Mechanical debridement of nails 1-5  bilaterally performed with a nail nipper.  Filed with dremel without incident.    Return office visit    3  months                  Told patient to return for periodic foot care and evaluation due to potential at risk complications.   Abree Romick DPM   

## 2022-09-18 ENCOUNTER — Encounter: Payer: Self-pay | Admitting: Family Medicine

## 2022-09-18 ENCOUNTER — Ambulatory Visit: Payer: Medicare Other | Admitting: Family Medicine

## 2022-09-18 VITALS — BP 124/75 | HR 81 | Temp 98.8°F | Resp 16 | Ht 70.0 in | Wt 161.2 lb

## 2022-09-18 DIAGNOSIS — R051 Acute cough: Secondary | ICD-10-CM | POA: Diagnosis not present

## 2022-09-18 DIAGNOSIS — N401 Enlarged prostate with lower urinary tract symptoms: Secondary | ICD-10-CM | POA: Diagnosis not present

## 2022-09-18 DIAGNOSIS — I10 Essential (primary) hypertension: Secondary | ICD-10-CM | POA: Diagnosis not present

## 2022-09-18 DIAGNOSIS — R509 Fever, unspecified: Secondary | ICD-10-CM

## 2022-09-18 DIAGNOSIS — R5383 Other fatigue: Secondary | ICD-10-CM | POA: Diagnosis not present

## 2022-09-18 LAB — POC COVID19 BINAXNOW: SARS Coronavirus 2 Ag: NEGATIVE

## 2022-09-18 MED ORDER — PREDNISONE 20 MG PO TABS: ORAL_TABLET | ORAL | 0 refills | Status: DC

## 2022-09-18 MED ORDER — AMOXICILLIN-POT CLAVULANATE 875-125 MG PO TABS: 1.0000 | ORAL_TABLET | Freq: Two times a day (BID) | ORAL | 0 refills | Status: AC

## 2022-09-18 MED ORDER — ALBUTEROL SULFATE HFA 108 (90 BASE) MCG/ACT IN AERS: 2.0000 | INHALATION_SPRAY | Freq: Four times a day (QID) | RESPIRATORY_TRACT | 2 refills | Status: AC | PRN

## 2022-09-18 MED ORDER — TAMSULOSIN HCL 0.4 MG PO CAPS: 0.4000 mg | ORAL_CAPSULE | Freq: Every day | ORAL | 3 refills | Status: DC

## 2022-09-18 NOTE — Progress Notes (Signed)
Phone [phone] In person visit   Subjective:   William Mahoney is a 89 y.o. year old very pleasant male patient who presents for/with See problem oriented charting Chief Complaint  Patient presents with   Cough    Productive cough x 2 days  At home COVID test negative, but test was expired, daughter requesting in office COVID test today   Fever    Highest 100.6    Fatigue    Past Medical History-  Patient Active Problem List   Diagnosis Date Noted   AAA (abdominal aortic aneurysm) without rupture 11/20/2019    Priority: High   Edema 11/10/2016    Priority: High   Atrial fibrillation (HCC) 12/07/2008    Priority: High   Hyperglycemia 06/25/2021    Priority: Medium    PAD (peripheral artery disease) (HCC) 11/20/2019    Priority: Medium    Chronic kidney disease (CKD), stage III (moderate) (HCC) 02/15/2019    Priority: Medium    Aortic atherosclerosis (HCC) 01/05/2019    Priority: Medium    Insomnia 10/19/2014    Priority: Medium    Gout 01/09/2014    Priority: Medium    History of cancer of rectosigmoid junction 01/04/2008    Priority: Medium    Former smoker 06/23/2007    Priority: Medium    Hyperlipidemia, unspecified 02/09/2007    Priority: Medium    Essential hypertension 12/07/2006    Priority: Medium    COPD (chronic obstructive pulmonary disease) (HCC) 12/07/2006    Priority: Medium    BPH (benign prostatic hyperplasia) 12/07/2006    Priority: Medium    Encounter for therapeutic drug monitoring 08/27/2014    Priority: Low   Allergic rhinitis 08/15/2013    Priority: Low   Arthritis of lumbar spine 01/20/2013    Priority: Low   Inguinal hernia 03/21/2010    Priority: Low   BASAL CELL CARCINOMA SKIN LOWER LIMB INCL HIP 03/21/2010    Priority: Low   Actinic keratosis 12/07/2008    Priority: Low   TMJ (temporomandibular joint disorder) 02/09/2007    Priority: Low   URINARY INCONTINENCE 12/07/2006    Priority: Low   PANCREATITIS, HX OF 12/07/2006     Priority: Low   Porokeratosis 11/25/2021    Medications- reviewed and updated Current Outpatient Medications  Medication Sig Dispense Refill   acetic acid-hydrocortisone (VOSOL-HC) OTIC solution PLACE 4 DROPS INTO THE AFFECTED EAR(S) 2 (TWO) TIMES DAILY FOR EAR CANAL IRRITATION/OTITIS EXTERNA. 10 mL 0   albuterol (VENTOLIN HFA) 108 (90 Base) MCG/ACT inhaler Inhale 2 puffs into the lungs every 6 (six) hours as needed for wheezing or shortness of breath. 1 each 2   allopurinol (ZYLOPRIM) 300 MG tablet TAKE ONE-HALF TABLET BY MOUTH IN THE EVENING 45 tablet 1   amLODipine (NORVASC) 5 MG tablet TAKE 1 TABLET (5 MG TOTAL) BY MOUTH DAILY. FOR BLOOD PRESSURE AND POSSIBLE RAYNAUDS 90 tablet 1   amoxicillin-clavulanate (AUGMENTIN) 875-125 MG tablet Take 1 tablet by mouth 2 (two) times daily for 7 days. 14 tablet 0   fluocinonide (LIDEX) 0.05 % external solution APPLY 1 ML TO SKIN EVERY NIGHT APPLY TO AFFECTED AREAS OF SCALP AND CHEST NIGHTLY 60 mL 3   fluticasone (FLONASE) 50 MCG/ACT nasal spray Place 2 sprays into both nostrils daily. 48 g 3   furosemide (LASIX) 20 MG tablet TAKE 1 TABLET BY MOUTH EVERY DAY AS NEEDED 90 tablet 1   Multiple Vitamin (MULTIVITAMIN) tablet Take 1 tablet by mouth daily.  predniSONE (DELTASONE) 20 MG tablet Take 1 tablet by mouth daily for 5 days, then 1/2 tablet daily for 2 days 6 tablet 0   terbinafine (LAMISIL) 250 MG tablet Take 250 mg by mouth daily.     triamterene-hydrochlorothiazide (MAXZIDE-25) 37.5-25 MG tablet TAKE 1/2 TABLET BY MOUTH DAILY 45 tablet 1   warfarin (COUMADIN) 2.5 MG tablet TAKE 1 TABLET DAILY EXCEPT TAKE 1 AND 1/2 TABLETS ON WED AND SAT OR TAKE AS DIRECTED BY CLINIC 120 tablet 1   zolpidem (AMBIEN) 5 MG tablet TAKE 1 TABLET (5 MG TOTAL) BY MOUTH AT BEDTIME AS NEEDED FOR SLEEP (DOSE INCREASE. DOES NOT DRIVE. FAMILY WILL BE IN HOME WHEN HE TAKES FULL DOSE. IF ANY FALLS WILL STOP). 30 tablet 2   tamsulosin (FLOMAX) 0.4 MG CAPS capsule Take 1  capsule (0.4 mg total) by mouth daily. 90 capsule 3   No current facility-administered medications for this visit.     Objective:  BP 124/75   Pulse 81   Temp 98.8 F (37.1 C) (Temporal)   Resp 16   Ht 5\' 10"  (1.778 m)   Wt 161 lb 3.2 oz (73.1 kg)   SpO2 97%   BMI 23.13 kg/m  Gen: NAD, resting comfortably, coarse cough during visit Pharynx with mild drainage, tympanic membranes normal bilaterally, nasal turbinates erythematous and yellow discharge noted, no sinus tenderness CV: RRR today (a fib not obvious on exam) no murmurs rubs or gallops Lungs: CTAB other than occasional diffuse wheeze  Ext: 1+ edema-stable overall Skin: warm, dry  Results for orders placed or performed in visit on 09/18/22 (from the past 24 hour(s))  POC COVID-19     Status: Normal   Collection Time: 09/18/22  9:56 AM  Result Value Ref Range   SARS Coronavirus 2 Ag Negative Negative       Assessment and Plan   # Cough/chest congestion/wheeze S:productive cough for 2 days. At home COVID test negative but expired- in office test requested. Feeling fatigued. Fever up to 100.6 at home and felt run down. Some chest congestion and nasal congestion with this and a lot of phlegm- whitish. No abdominal pain. Increased wheeze.  -daughter has not been sick but works with children and there have been some RSV cases -has had cryotherapy -taking some tylenol- has used pm version- advised against. A/P: 89 year old male with typically well-controlled COPD presenting with cough and fever along with increased sputum production and wheezing - Lungs were clear on exam - COVID test was negative but we discussed is still within the range of possibility - We do not have an adult RSV test-would have to go to emergency department - Suspected COPD exacerbation-treat with prednisone (though tried low-dose as he is not sure if he tolerates minutes (And Augmentin-asked him to let me know if fails to improve by Monday-will  consider chest x-ray or simply adding on azithromycin or doxycycline for atypical bacteria coverage -Would avoid quinolones with aortic aneurysm history   #hypertension S: medication: Amlodipine 5 mg for help with Raynaud's, triamterene hydrochlorothiazide 37.5-25 mg, Lasix 20 mg as needed for edema but has not needed lately A/P: Blood pressure well-controlled but we discussed with illness sometimes blood pressure will drop lower-encouraged taking half dose of amlodipine and triamterene hydrochlorothiazide if blood pressure drops any further from today  #BPH S: Medication: Tamsulosin 0.4 mg A/P: Patient reports good control would like a refill-I just encouraged him to be cautious with position changes especially with illness-he has felt somewhat weaker so  encouraged him to use walker instead of cane-also advised against Tylenol PM which increases fall risk  Recommended follow up: Return for as needed for new, worsening, persistent symptoms. Future Appointments  Date Time Provider Department Center  09/30/2022  3:30 PM [facility]  10/27/2022  9:20 AM Rice, Jamesetta Orleans, MD [facility] None  01/01/2023  3:00 PM [facility] ANNUAL WELLNESS VISIT 1 [facility]    Lab/Order associations:   ICD-10-CM   1. Acute cough  R05.1 POC COVID-19    2. Fever, unspecified fever cause  R50.9 POC COVID-19    3. Other fatigue  R53.83 POC COVID-19    4. Essential hypertension  I10     5. Benign prostatic hyperplasia with lower urinary tract symptoms, symptom details unspecified  N40.1       Meds ordered this encounter  Medications   tamsulosin (FLOMAX) 0.4 MG CAPS capsule    Sig: Take 1 capsule (0.4 mg total) by mouth daily.    Dispense:  90 capsule    Refill:  3   albuterol (VENTOLIN HFA) 108 (90 Base) MCG/ACT inhaler    Sig: Inhale 2 puffs into the lungs every 6 (six) hours as needed for wheezing or shortness of breath.    Dispense:  1 each    Refill:  2   predniSONE (DELTASONE) 20 MG  tablet    Sig: Take 1 tablet by mouth daily for 5 days, then 1/2 tablet daily for 2 days    Dispense:  6 tablet    Refill:  0   amoxicillin-clavulanate (AUGMENTIN) 875-125 MG tablet    Sig: Take 1 tablet by mouth 2 (two) times daily for 7 days.    Dispense:  14 tablet    Refill:  0    Return precautions advised.  Tana Conch, MD

## 2022-09-18 NOTE — Patient Instructions (Addendum)
  Please use walker until your strength improves  Albuterol as needed for cough or wheeze every 4-6 hours  Low dose prednisone- we can increase if needed if not improving  Augmentin with food twice a day for 7 days  Monitor blood pressure - if gets any lower can cut blood pressure medicines in half- amlodipine for instance and the triamterene- hydrochlorothiazide    Recommended follow up: Return for as needed for new, worsening, persistent symptoms. Let me know if not improving by Monday or seek care over weekend if worsens

## 2022-09-26 ENCOUNTER — Other Ambulatory Visit: Payer: Self-pay | Admitting: Family Medicine

## 2022-09-28 ENCOUNTER — Telehealth: Payer: Self-pay | Admitting: Family Medicine

## 2022-09-28 NOTE — Telephone Encounter (Signed)
Prescription Request  09/28/2022  LOV: 09/18/2022  What is the name of the medication or equipment?  zolpidem (AMBIEN) 5 MG tablet    Have you contacted your pharmacy to request a refill? Yes   Which pharmacy would you like this sent to?  [id number] [place], [place] - [address] [place] [address] Phone: [phone] Fax: [phone]    Patient notified that their request is being sent to the clinical staff for review and that they should receive a response within 2 business days.   Please advise at Mobile [phone] (home)

## 2022-09-28 NOTE — Telephone Encounter (Signed)
I believe I responded to this earlier today-sent as refill request

## 2022-09-30 ENCOUNTER — Ambulatory Visit: Payer: Medicare Other

## 2022-10-07 ENCOUNTER — Ambulatory Visit: Payer: Medicare Other

## 2022-10-12 ENCOUNTER — Encounter: Payer: Medicare Other | Admitting: Internal Medicine

## 2022-10-14 ENCOUNTER — Other Ambulatory Visit: Payer: Self-pay

## 2022-10-14 ENCOUNTER — Ambulatory Visit (INDEPENDENT_AMBULATORY_CARE_PROVIDER_SITE_OTHER): Payer: Medicare Other

## 2022-10-14 DIAGNOSIS — Z7901 Long term (current) use of anticoagulants: Secondary | ICD-10-CM | POA: Diagnosis not present

## 2022-10-14 DIAGNOSIS — Z5181 Encounter for therapeutic drug level monitoring: Secondary | ICD-10-CM

## 2022-10-14 LAB — POCT INR: INR: 2.1 (ref 2.0–3.0)

## 2022-10-14 NOTE — Progress Notes (Signed)
Pt reports restarting terbinafine, so he change his warfarin dosing to 1 tablet daily, coumadin clinic was not notified of change in dosing. He said he knew it interacted with warfarin so he thought it would be best to reduce the dose.  Since pt is in range today we will continue with one tablet daily. Continue 1 tablet daily. Recheck in 6 weeks at [facility].

## 2022-10-14 NOTE — Patient Instructions (Addendum)
Pre visit review using our clinic review tool, if applicable. No additional management support is needed unless otherwise documented below in the visit note.  Continue 1 tablet daily. Recheck in 6 weeks at [facility].

## 2022-10-16 ENCOUNTER — Encounter: Payer: Self-pay | Admitting: Family Medicine

## 2022-10-16 ENCOUNTER — Ambulatory Visit: Payer: Medicare Other | Admitting: Family Medicine

## 2022-10-16 VITALS — BP 112/62 | HR 70 | Temp 97.7°F | Wt 162.0 lb

## 2022-10-16 DIAGNOSIS — H6122 Impacted cerumen, left ear: Secondary | ICD-10-CM | POA: Diagnosis not present

## 2022-10-16 DIAGNOSIS — Z7901 Long term (current) use of anticoagulants: Secondary | ICD-10-CM

## 2022-10-16 DIAGNOSIS — H1033 Unspecified acute conjunctivitis, bilateral: Secondary | ICD-10-CM | POA: Diagnosis not present

## 2022-10-16 DIAGNOSIS — H66002 Acute suppurative otitis media without spontaneous rupture of ear drum, left ear: Secondary | ICD-10-CM | POA: Diagnosis not present

## 2022-10-16 MED ORDER — POLYMYXIN B-TRIMETHOPRIM 10000-0.1 UNIT/ML-% OP SOLN
1.0000 [drp] | OPHTHALMIC | 0 refills | Status: DC
Start: 2022-10-16 — End: 2022-12-22

## 2022-10-16 MED ORDER — AMOXICILLIN-POT CLAVULANATE 500-125 MG PO TABS
1.0000 | ORAL_TABLET | Freq: Two times a day (BID) | ORAL | 0 refills | Status: AC
Start: 2022-10-16 — End: 2022-10-23

## 2022-10-16 NOTE — Progress Notes (Signed)
Established Patient Office Visit   Subjective  Patient ID: William Mahoney, male    DOB: [birth year]  Age: 89 y.o. MRN: [id number]  Chief Complaint  Patient presents with   Conjunctivitis    Yellow goop coming out of both eyes, says they are feeling itchy, like a boulder is in them. Did do damp compresses last night. Goop started 24 hours ago. Left ear feels dull and achy  Pt accompanied by his daughter.  Patient is a very pleasant 89 year old male followed by Dr. Durene Cal and seen for acute concern.  Patient with eye irritation, drainage, and erythema x 1-2 days.  Yellow thick drainage noted.  Patient tried compresses.  Typically stays at home, but was around about 40 family members/friends for his 89th birthday party last week.  Patient's daughter works with school-aged children.  Patient also notes left ear muffled with mild discomfort.  Denies pain.  Tried to remove wax from the ear by using peroxide.  Conjunctivitis     Patient Active Problem List   Diagnosis Date Noted   Porokeratosis 11/25/2021   Hyperglycemia 06/25/2021   AAA (abdominal aortic aneurysm) without rupture 11/20/2019   PAD (peripheral artery disease) (HCC) 11/20/2019   Chronic kidney disease (CKD), stage III (moderate) (HCC) 02/15/2019   Aortic atherosclerosis (HCC) 01/05/2019   Edema 11/10/2016   Insomnia 10/19/2014   Encounter for therapeutic drug monitoring 08/27/2014   Gout 01/09/2014   Allergic rhinitis 08/15/2013   Arthritis of lumbar spine 01/20/2013   Inguinal hernia 03/21/2010   BASAL CELL CARCINOMA SKIN LOWER LIMB INCL HIP 03/21/2010   Atrial fibrillation (HCC) 12/07/2008   Actinic keratosis 12/07/2008   History of cancer of rectosigmoid junction 01/04/2008   Former smoker 06/23/2007   Hyperlipidemia, unspecified 02/09/2007   TMJ (temporomandibular joint disorder) 02/09/2007   Essential hypertension 12/07/2006   COPD (chronic obstructive pulmonary disease) (HCC) 12/07/2006   BPH (benign  prostatic hyperplasia) 12/07/2006   URINARY INCONTINENCE 12/07/2006   PANCREATITIS, HX OF 12/07/2006   Past Surgical History:  Procedure Laterality Date   CARDIAC CATHETERIZATION  2001  approx.  in [place]   CATARACT EXTRACTION W/ INTRAOCULAR LENS  IMPLANT, BILATERAL     COLONOSCOPY  last one 06-27-2012   EXPLORATORY LAPAROTOMY/  LOW ANTERIOR RESECTION/  SIGMOID COLECTOMY  04-18-2008   DR INGRAM   INCISION AND DRAINAGE ABSCESS N/A 11/02/2014   Procedure: INCISION AND DRAINAGE OF SCROTUM;  Surgeon: Crist Fat, MD;  Location: [facility];  Service: Urology;  Laterality: N/A;   INGUINAL HERNIA REPAIR Right 03/23/2014   Procedure: HERNIA REPAIR INGUINAL ADULT OPEN REPAIR RIGHT INGUINAL HERNIA REPAIR;  Surgeon: Claud Kelp, MD;  Location: [facility] OR;  Service: General;  Laterality: Right;   INSERTION OF MESH Right 03/23/2014   Procedure: INSERTION OF MESH;  Surgeon: Claud Kelp, MD;  Location: [facility] OR;  Service: General;  Laterality: Right;   LAPAROSCOPIC CHOLECYSTECTOMY  1990's   MASS EXCISION N/A 11/02/2014   Procedure: EXCISION OF EPIDIDYMAL CYST;  Surgeon: Crist Fat, MD;  Location: [facility];  Service: Urology;  Laterality: N/A;   TONSILLECTOMY  as child   TRANSTHORACIC ECHOCARDIOGRAM  05-11-2008   pseudonormal LV filling pattern,  ef 60-65%/  mild to moderate MV calcification no stenosis w/ mild to moderate regurg./  mild LAE and RAE/  mild TR   Social History   Tobacco Use   Smoking status: Former    Packs/day: 1.00    Years: 55.00    Additional  pack years: 0.00    Total pack years: 55.00    Types: Cigarettes    Quit date: 03/20/2008    Years since quitting: 14.5   Smokeless tobacco: Never  Vaping Use   Vaping Use: Never used  Substance Use Topics   Alcohol use: No    Comment: hx alcohol abuse -- quit in 1980's    Drug use: No   Family History  Problem Relation Age of Onset   Hypertension Mother    Lymphoma Father    Lymphoma  Other    Stroke Other        1st degree relative   Colon cancer Neg Hx    Esophageal cancer Neg Hx    Rectal cancer Neg Hx    Stomach cancer Neg Hx    No Known Allergies    ROS Negative unless stated above    Objective:     BP 112/62 (BP Location: Left Arm, Patient Position: Sitting, Cuff Size: Normal)   Pulse 70   Temp 97.7 F (36.5 C) (Oral)   Wt 162 lb (73.5 kg)   SpO2 97%   BMI 23.24 kg/m    Physical Exam Constitutional:      General: He is not in acute distress.    Appearance: Normal appearance.  HENT:     Head: Normocephalic and atraumatic.     Right Ear: Hearing and tympanic membrane normal.     Left Ear: Hearing normal. Tympanic membrane is retracted.     Ears:     Comments: Left canal with white, soft cerumen blocking view of TM.  Cerumen removed with curette by this provider.  Left TM with suppurative fluid behind TM.    Nose: Nose normal.     Mouth/Throat:     Mouth: Mucous membranes are moist.  Eyes:     General:        Right eye: Discharge present.        Left eye: Discharge present.    Extraocular Movements: Extraocular movements intact.     Conjunctiva/sclera:     Right eye: Right conjunctiva is injected.     Left eye: Left conjunctiva is injected.     Comments: Dried drainage noted in corner of left eye.  Mild edema of upper and lower eyelids bilaterally.  Cardiovascular:     Rate and Rhythm: Normal rate and regular rhythm.     Heart sounds: Normal heart sounds. No murmur heard.    No gallop.  Pulmonary:     Effort: Pulmonary effort is normal. No respiratory distress.     Breath sounds: Normal breath sounds. No wheezing, rhonchi or rales.  Skin:    General: Skin is warm and dry.  Neurological:     Mental Status: He is alert and oriented to person, place, and time.  Psychiatric:        Mood and Affect: Mood normal.        Behavior: Behavior normal.        Thought Content: Thought content normal.      No results found for any visits  on 10/16/22.    Assessment & Plan:  Acute bacterial conjunctivitis of both eyes -     Polymyxin B-Trimethoprim; Place 1 drop into both eyes every 4 (four) hours.  Dispense: 10 mL; Refill: 0  Acute suppurative otitis media of left ear without spontaneous rupture of tympanic membrane, recurrence not specified -     Amoxicillin-Pot Clavulanate; Take 1 tablet by mouth in the morning  and at bedtime for 7 days.  Dispense: 14 tablet; Refill: 0  Impacted cerumen of left ear  Chronic anticoagulation  New problems.  Consent obtained.  Impacted cerumen removed.  Patient tolerated procedure well.  Start abx eye gtts for acute true conjunctivitis.  Hand hygiene encouraged.  Start Augmentin for left AOM.  Aware ABX use may affect INR.  Return if symptoms worsen or fail to improve.   Deeann Saint, MD

## 2022-10-16 NOTE — Patient Instructions (Signed)
Happy Birthday!

## 2022-10-27 ENCOUNTER — Other Ambulatory Visit: Payer: Self-pay | Admitting: Family Medicine

## 2022-10-27 ENCOUNTER — Encounter: Payer: Medicare Other | Admitting: Internal Medicine

## 2022-10-27 DIAGNOSIS — H6592 Unspecified nonsuppurative otitis media, left ear: Secondary | ICD-10-CM

## 2022-10-27 NOTE — Progress Notes (Deleted)
Office Visit Note  Patient: William Mahoney             Date of Birth: 06-Oct-1925           MRN: [id number]             PCP: Shelva Majestic, MD Referring: Shelva Majestic, MD Visit Date: 10/27/2022 Occupation: @GUAROCC @  Subjective:  No chief complaint on file.   History of Present Illness: William Mahoney is a 89 y.o. male ***     Activities of Daily Living:  Patient reports morning stiffness for *** {minute/hour:19697}.   Patient {ACTIONS;DENIES/REPORTS:21021675::"Denies"} nocturnal pain.  Difficulty dressing/grooming: {ACTIONS;DENIES/REPORTS:21021675::"Denies"} Difficulty climbing stairs: {ACTIONS;DENIES/REPORTS:21021675::"Denies"} Difficulty getting out of chair: {ACTIONS;DENIES/REPORTS:21021675::"Denies"} Difficulty using hands for taps, buttons, cutlery, and/or writing: {ACTIONS;DENIES/REPORTS:21021675::"Denies"}  No Rheumatology ROS completed.   PMFS History:  Patient Active Problem List   Diagnosis Date Noted   Porokeratosis 11/25/2021   Hyperglycemia 06/25/2021   AAA (abdominal aortic aneurysm) without rupture 11/20/2019   PAD (peripheral artery disease) (HCC) 11/20/2019   Chronic kidney disease (CKD), stage III (moderate) (HCC) 02/15/2019   Aortic atherosclerosis (HCC) 01/05/2019   Edema 11/10/2016   Insomnia 10/19/2014   Encounter for therapeutic drug monitoring 08/27/2014   Gout 01/09/2014   Allergic rhinitis 08/15/2013   Arthritis of lumbar spine 01/20/2013   Inguinal hernia 03/21/2010   BASAL CELL CARCINOMA SKIN LOWER LIMB INCL HIP 03/21/2010   Atrial fibrillation (HCC) 12/07/2008   Actinic keratosis 12/07/2008   History of cancer of rectosigmoid junction 01/04/2008   Former smoker 06/23/2007   Hyperlipidemia, unspecified 02/09/2007   TMJ (temporomandibular joint disorder) 02/09/2007   Essential hypertension 12/07/2006   COPD (chronic obstructive pulmonary disease) (HCC) 12/07/2006   BPH (benign prostatic hyperplasia) 12/07/2006   URINARY  INCONTINENCE 12/07/2006   PANCREATITIS, HX OF 12/07/2006    Past Medical History:  Diagnosis Date   BPH (benign prostatic hypertrophy)    Chronic atrial fibrillation [facility])    cardiologist--   dr berry   COPD (chronic obstructive pulmonary disease) (HCC)    Diverticulosis of colon    MODERATE LEFT SIDE   Epididymal cyst    w/ epididymalitis   Full dentures    Gout    Hand foot syndrome    secondary to chemotherapy (Xeloda)   cold hands/feet   Hearing loss    History of alcohol abuse    quit drinking in the mid 80's   History of cirrhosis of liver    alcoholic--  hx alcohol abuse -- quit drinking 1980's   History of gout    History of melanoma excision    2013--  left ear lobe and back of hand   History of pancreatitis    2008   History of rectal cancer oncologist-  dr Truett Perna--  no recurrence   dx Sept 2009--  Stage II (T3N0)  s/p  sigmoid colectomy & low anterior resection 04-18-2008  and chemoradiation 2010   History of shingles 07/27/2010   History of squamous cell carcinoma excision    left lower leg   Hyperlipidemia    Hypertension    Loose stools    DUE TO ANTIBIOTICS   Macular degeneration    not sure which eye   OA (osteoarthritis)    RBBB (right bundle branch block)    Renal lesion    chronic-- left side   Sciatica of right side    Urge urinary incontinence    intermittant    Family History  Problem Relation Age of  Onset   Hypertension Mother    Lymphoma Father    Lymphoma Other    Stroke Other        1st degree relative   Colon cancer Neg Hx    Esophageal cancer Neg Hx    Rectal cancer Neg Hx    Stomach cancer Neg Hx    Past Surgical History:  Procedure Laterality Date   CARDIAC CATHETERIZATION  2001  approx.  in [place]   CATARACT EXTRACTION W/ INTRAOCULAR LENS  IMPLANT, BILATERAL     COLONOSCOPY  last one 06-27-2012   EXPLORATORY LAPAROTOMY/  LOW ANTERIOR RESECTION/  SIGMOID COLECTOMY  04-18-2008   DR INGRAM   INCISION AND DRAINAGE ABSCESS  N/A 11/02/2014   Procedure: INCISION AND DRAINAGE OF SCROTUM;  Surgeon: Crist Fat, MD;  Location: [facility];  Service: Urology;  Laterality: N/A;   INGUINAL HERNIA REPAIR Right 03/23/2014   Procedure: HERNIA REPAIR INGUINAL ADULT OPEN REPAIR RIGHT INGUINAL HERNIA REPAIR;  Surgeon: Claud Kelp, MD;  Location[facility];  Service: General;  Laterality: Right;   INSERTION OF MESH Right 03/23/2014   Procedure: INSERTION OF MESH;  Surgeon: Claud Kelp, MD;  Location: [facility] OR;  Service: General;  Laterality: Right;   LAPAROSCOPIC CHOLECYSTECTOMY  1990's   MASS EXCISION N/A 11/02/2014   Procedure: EXCISION OF EPIDIDYMAL CYST;  Surgeon: Crist Fat, MD;  Location: [facility];  Service: Urology;  Laterality: N/A;   TONSILLECTOMY  as child   TRANSTHORACIC ECHOCARDIOGRAM  05-11-2008   pseudonormal LV filling pattern,  ef 60-65%/  mild to moderate MV calcification no stenosis w/ mild to moderate regurg./  mild LAE and RAE/  mild TR   Social History   Social History Narrative   Married. 6 kids. Wife Claris Che also a patient of Dr. Pamala Hurry.       Retired.    Immunization History  Administered Date(s) Administered   Fluad Quad(high Dose 65+) 01/05/2019, 12/29/2019, 02/14/2021   Influenza Split 01/14/2011   Influenza, High Dose Seasonal PF 01/20/2013, 05/21/2017, 03/03/2018   Influenza,inj,Quad PF,6+ Mos 01/09/2014   PFIZER(Purple Top)SARS-COV-2 Vaccination 05/04/2019, 05/22/2019   Pneumococcal Conjugate-13 07/10/2014   Pneumococcal Polysaccharide-23 04/20/2006   Td 11/10/2012   Tdap 01/09/2018     Objective: Vital Signs: There were no vitals taken for this visit.   Physical Exam   Musculoskeletal Exam: ***  CDAI Exam: CDAI Score: -- Patient Global: --; Provider Global: -- Swollen: --; Tender: -- Joint Exam 10/27/2022   No joint exam has been documented for this visit   There is currently no information documented on the homunculus. Go to  the Rheumatology activity and complete the homunculus joint exam.  Investigation: No additional findings.  Imaging: No results found.  Recent Labs: Lab Results  Component Value Date   WBC 6.0 05/04/2022   HGB 14.3 05/04/2022   PLT 135.0 (L) 05/04/2022   NA 141 06/01/2022   K 3.4 (L) 06/01/2022   CL 97 06/01/2022   CO2 32 06/01/2022   GLUCOSE 91 06/01/2022   BUN 28 (H) 06/01/2022   CREATININE 1.46 06/01/2022   BILITOT 0.8 06/01/2022   ALKPHOS 71 06/01/2022   AST 21 06/01/2022   ALT 13 06/01/2022   PROT 6.4 06/01/2022   ALBUMIN 4.2 06/01/2022   CALCIUM 9.6 06/01/2022   GFRAA 65 04/05/2020    Speciality Comments: No specialty comments available.  Procedures:  No procedures performed Allergies: Patient has no known allergies.   Assessment / Plan:  Visit Diagnoses: No diagnosis found.  Orders: No orders of the defined types were placed in this encounter.  No orders of the defined types were placed in this encounter.   Face-to-face time spent with patient was *** minutes. Greater than 50% of time was spent in counseling and coordination of care.  Follow-Up Instructions: No follow-ups on file.   Fuller Plan, MD  Note - This record has been created using [employer].  Chart creation errors have been sought, but may not always  have been located. Such creation errors do not reflect on  the standard of medical care.

## 2022-11-02 IMAGING — US US SCROTUM W/ DOPPLER COMPLETE
1 series · 14 of 25 positions shown · non-contrast
Comparison: 02/27/2019

CLINICAL DATA: Right testicular swelling

EXAM:
SCROTAL ULTRASOUND
DOPPLER ULTRASOUND OF THE TESTICLES
TECHNIQUE: Complete ultrasound examination of the testicles, epididymis, and
other scrotal structures was performed. Color and spectral Doppler
ultrasound were also utilized to evaluate blood flow to the
testicles.

[Series 1: us scrotum w/doppler · 123 acquisitions, 14 frames shown]
[im 1/123]
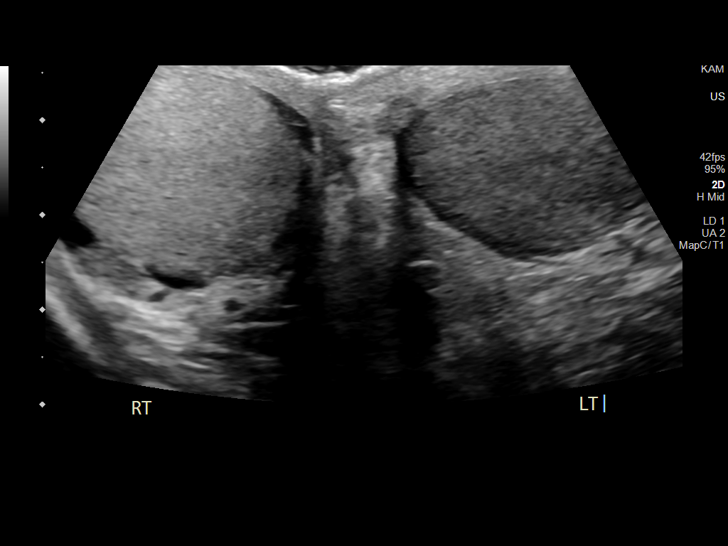
[im 11/123]
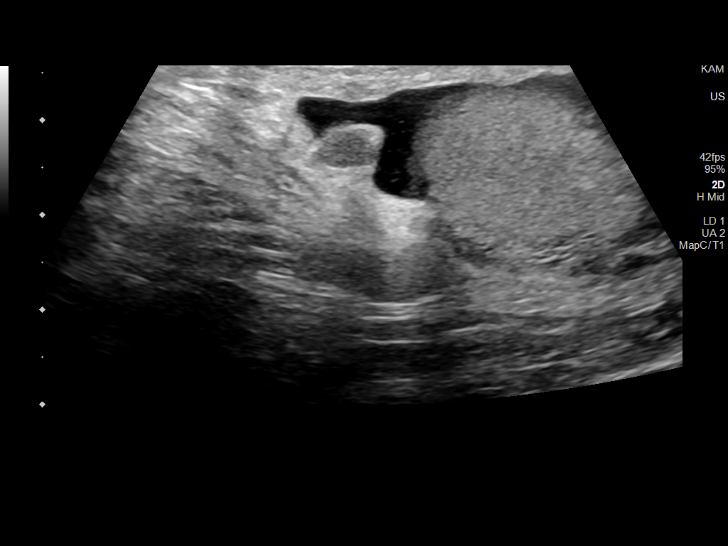
[im 21/123]
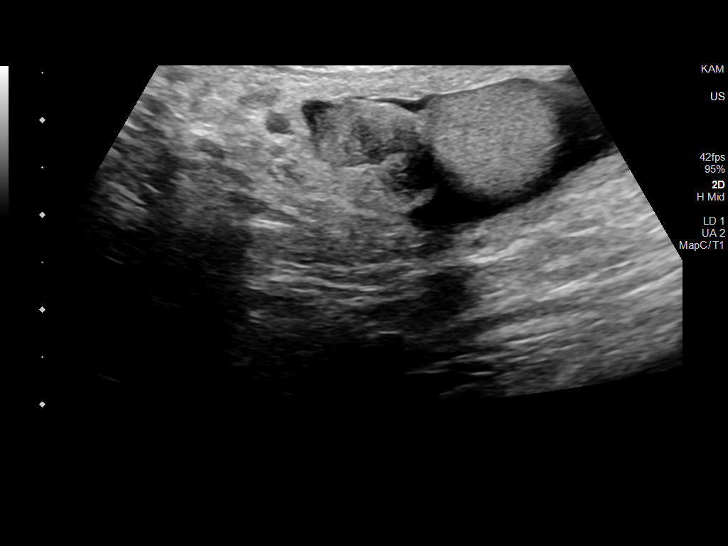
[im 31/123]
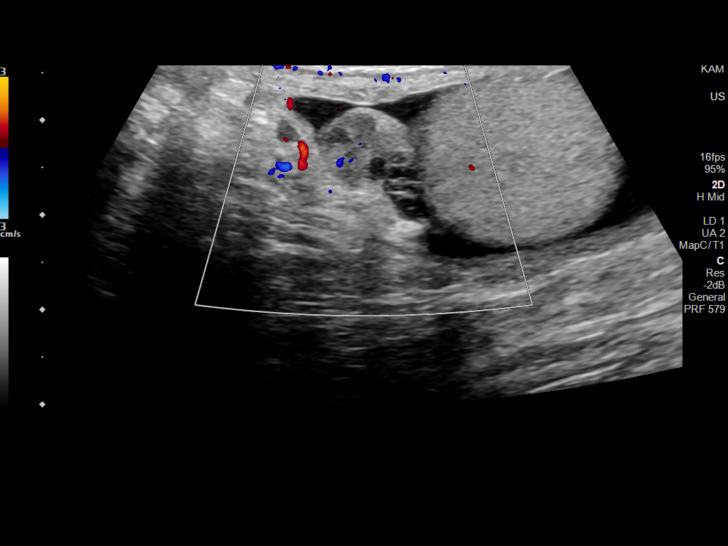
[im 41/123]
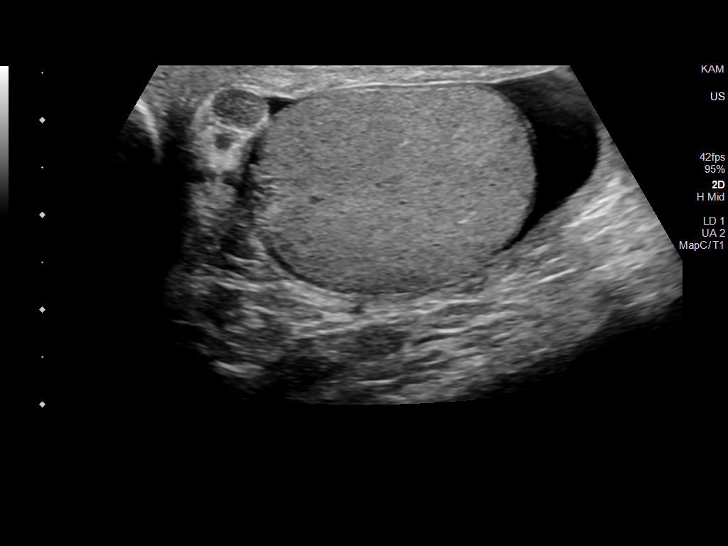
[im 46/123]
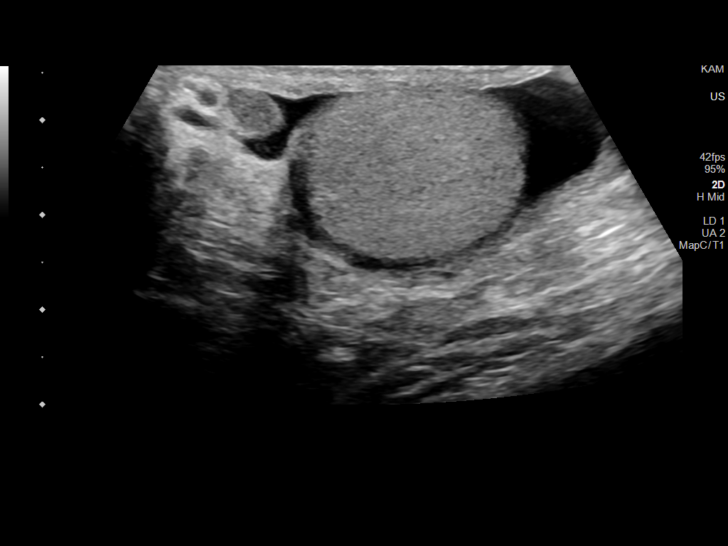
[im 56/123]
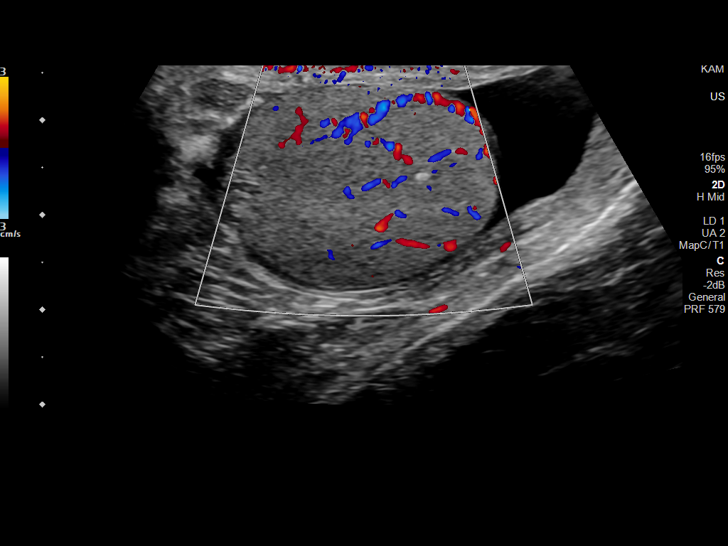
[im 67/123]
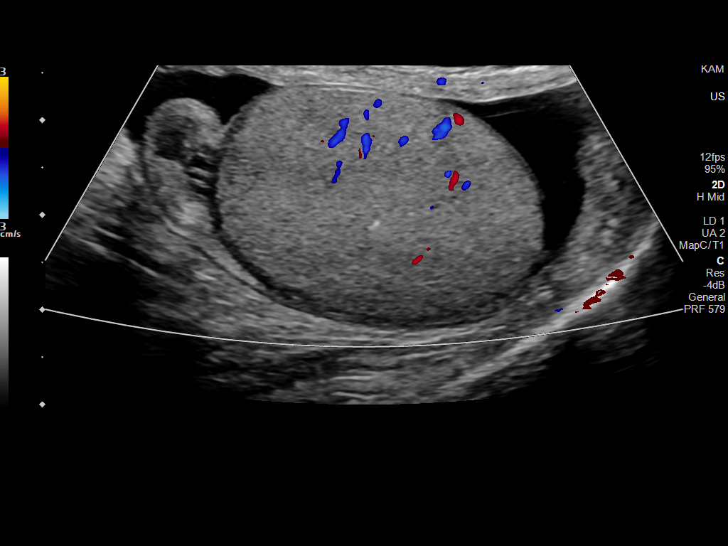
[im 77/123]
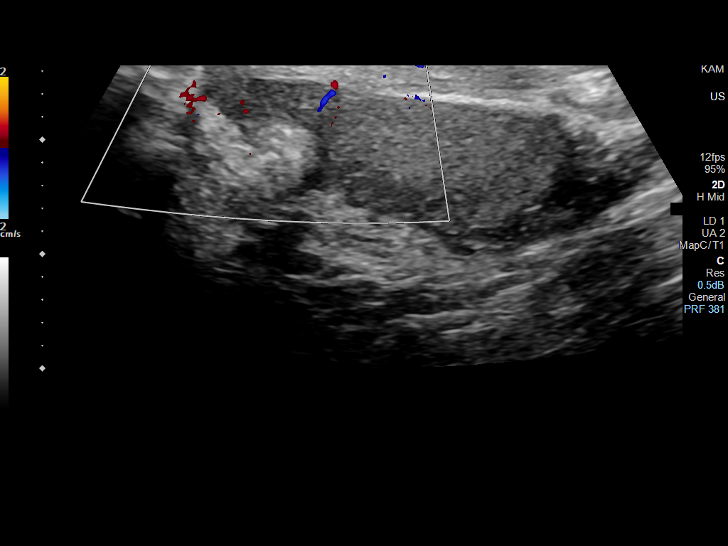
[im 82/123]
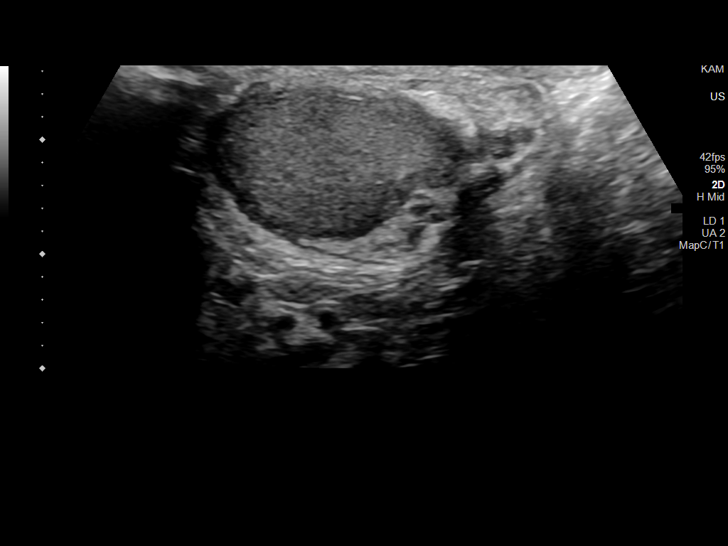
[im 92/123]
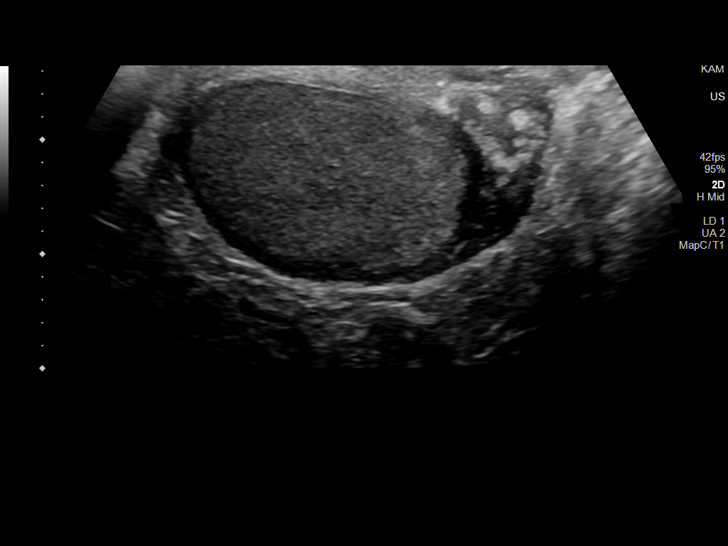
[im 102/123]
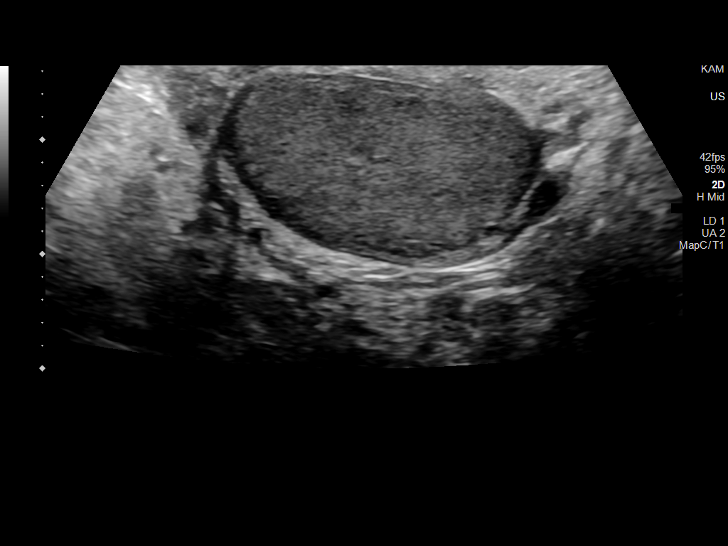
[im 112/123]
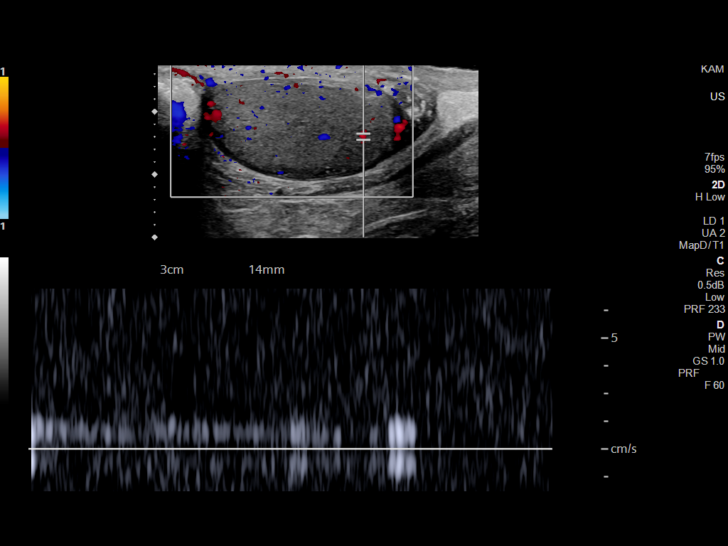
[im 123/123]
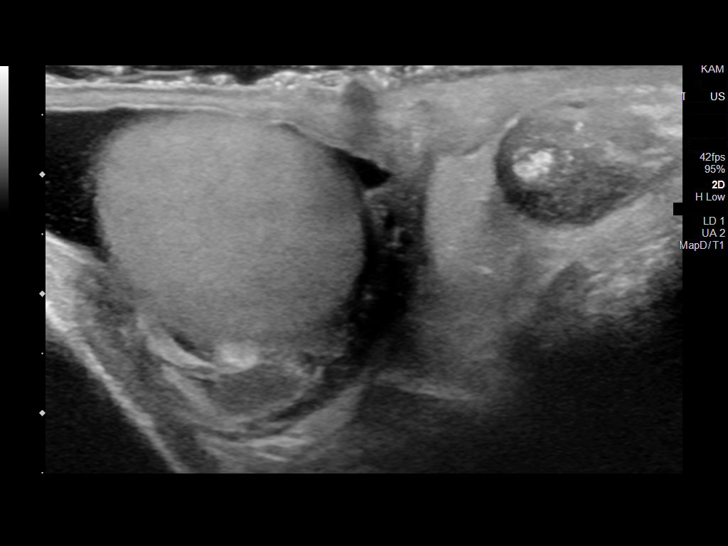

[14 of 25 positions shown; findings below may reference images not displayed]

FINDINGS: Right testicle

Measurements: 3.7 x 2.0 x 3.0 cm. No mass. Scattered
microcalcifications.

Left testicle

Measurements: 2.9 by 1.6 x 2.5 cm. Left testicle is smaller than the
right with similar appearance to prior study.

Right epididymis:  6 mm cyst in the epididymal head.

Left epididymis: Epididymal tail appears prominent but is similar to
prior study. No hyperemia.

Hydrocele:  Small right hydrocele.

Varicocele:  None visualized.

Pulsed Doppler interrogation of both testes demonstrates normal low
resistance arterial and venous waveforms bilaterally.
IMPRESSION: Left testicle is small, but is unchanged since prior study.

Prominent left epididymal tail without hyperemia. This also is
unchanged since prior study.

Small right hydrocele.

No evidence of testicular torsion or mass.

## 2022-11-15 ENCOUNTER — Other Ambulatory Visit: Payer: Self-pay | Admitting: Family Medicine

## 2022-11-18 ENCOUNTER — Other Ambulatory Visit: Payer: Self-pay | Admitting: Family Medicine

## 2022-11-18 DIAGNOSIS — H6592 Unspecified nonsuppurative otitis media, left ear: Secondary | ICD-10-CM

## 2022-11-23 ENCOUNTER — Telehealth: Payer: Self-pay | Admitting: Family Medicine

## 2022-11-23 NOTE — Telephone Encounter (Signed)
Due to fall risk we have been trying to remove medicines like alprazolam and Ambien.  He is already still taking Ambien at night.  Ambien and alprazolam together are risky particularly at his age.  Would he be open to trying something less potent like buspirone?  It is not quite as potent but may help without having as many side effects

## 2022-11-23 NOTE — Telephone Encounter (Signed)
Called and lm for pt tcb. 

## 2022-11-23 NOTE — Telephone Encounter (Signed)
Ov needed for this?

## 2022-11-23 NOTE — Telephone Encounter (Signed)
Prescription Request  11/23/2022  LOV: 09/18/2022  What is the name of the medication or equipment? Pt just needs a few Xanax because he states he is traveling and gets anxiety when he travels  Have you contacted your pharmacy to request a refill? No   Which pharmacy would you like this sent to?  [id number] [place], [place] - [address] [place] [address] Phone: [phone] Fax: [phone]    Patient notified that their request is being sent to the clinical staff for review and that they should receive a response within 2 business days.   Please advise at Mobile There is no such number on file (mobile).

## 2022-11-24 MED ORDER — ALPRAZOLAM 0.25 MG PO TABS: 0.2500 mg | ORAL_TABLET | Freq: Two times a day (BID) | ORAL | 0 refills | Status: DC | PRN

## 2022-11-24 NOTE — Telephone Encounter (Signed)
Called and spoke with pt daughter Belenda Cruise and she is not wanting to do the Buspirone. She knows that Alprazolam works best for her dad, she is not taking the Ambien with them on the trip and only needs 2-4 pills (they have one connecting flight which he is anxious about and one direct flight along with putting the headstone on his wife's grave).

## 2022-11-24 NOTE — Telephone Encounter (Signed)
Sent - make sure he has someone with him for 8-12 hours after taking with increased fall risk

## 2022-11-24 NOTE — Telephone Encounter (Signed)
Called and spoke with pt daughter and made her aware.

## 2022-11-25 ENCOUNTER — Ambulatory Visit: Payer: Medicare Other

## 2022-12-02 ENCOUNTER — Ambulatory Visit (INDEPENDENT_AMBULATORY_CARE_PROVIDER_SITE_OTHER): Payer: Medicare Other

## 2022-12-02 DIAGNOSIS — Z7901 Long term (current) use of anticoagulants: Secondary | ICD-10-CM | POA: Diagnosis not present

## 2022-12-02 LAB — POCT INR: INR: 2.1 (ref 2.0–3.0)

## 2022-12-02 NOTE — Progress Notes (Signed)
I have reviewed and agree with note, evaluation, plan.   Stephen Hunter, MD  

## 2022-12-02 NOTE — Patient Instructions (Addendum)
Pre visit review using our clinic review tool, if applicable. No additional management support is needed unless otherwise documented below in the visit note.  Continue 1 tablet daily. Recheck in 6 weeks at [facility].

## 2022-12-02 NOTE — Progress Notes (Signed)
Continue 1 tablet daily. Recheck in 6 weeks at [facility].

## 2022-12-03 ENCOUNTER — Encounter (INDEPENDENT_AMBULATORY_CARE_PROVIDER_SITE_OTHER): Payer: Self-pay

## 2022-12-22 ENCOUNTER — Other Ambulatory Visit: Payer: Self-pay | Admitting: Family Medicine

## 2022-12-22 DIAGNOSIS — H1033 Unspecified acute conjunctivitis, bilateral: Secondary | ICD-10-CM

## 2022-12-23 ENCOUNTER — Ambulatory Visit: Payer: Medicare Other | Admitting: Adult Health

## 2022-12-23 ENCOUNTER — Other Ambulatory Visit: Payer: Self-pay | Admitting: Family Medicine

## 2022-12-23 ENCOUNTER — Encounter: Payer: Self-pay | Admitting: Adult Health

## 2022-12-23 VITALS — BP 120/78 | HR 57 | Temp 97.6°F | Ht 70.0 in | Wt 167.0 lb

## 2022-12-23 DIAGNOSIS — R600 Localized edema: Secondary | ICD-10-CM

## 2022-12-23 DIAGNOSIS — G4700 Insomnia, unspecified: Secondary | ICD-10-CM

## 2022-12-23 DIAGNOSIS — H6592 Unspecified nonsuppurative otitis media, left ear: Secondary | ICD-10-CM

## 2022-12-23 MED ORDER — HYDROCORTISONE-ACETIC ACID 1-2 % OT SOLN: 3.0000 [drp] | Freq: Three times a day (TID) | OTIC | 3 refills | Status: DC

## 2022-12-23 MED ORDER — FUROSEMIDE 20 MG PO TABS
20.0000 mg | ORAL_TABLET | Freq: Every day | ORAL | 0 refills | Status: AC | PRN
Start: 2022-12-23 — End: ?

## 2022-12-23 NOTE — Progress Notes (Signed)
Subjective:    Patient ID: William Mahoney, male    DOB: [birth year], 89 y.o.   MRN: [id number]  HPI 89 year old male who  has a past medical history of BPH (benign prostatic hypertrophy), Chronic atrial fibrillation (HCC), COPD (chronic obstructive pulmonary disease) (HCC), Diverticulosis of colon, Epididymal cyst, Full dentures, Gout, Hand foot syndrome, Hearing loss, History of alcohol abuse, History of cirrhosis of liver, History of gout, History of melanoma excision, History of pancreatitis, History of rectal cancer (oncologist-  dr Truett Perna--  no recurrence), History of shingles (07/27/2010), History of squamous cell carcinoma excision, Hyperlipidemia, Hypertension, Loose stools, Macular degeneration, OA (osteoarthritis), RBBB (right bundle branch block), Renal lesion, Sciatica of right side, and Urge urinary incontinence.  His daughter is with him today   He is a patient of Dr. Durene Cal who I am seeing today for bilateral lower extremity edema. Reports yesterday his legs were swollen and weeping. Today the edema looks better. No SOB or CP   He is prescribed Lasix 20 mg but he has not been taking taking it ( this has not been filled since last year). He does take Maxide 37.5-25 mg daily.   Additionally, he has been having trouble sleeping despite taking Ambien 5 mg every night. He will lay in bed till 3 am and not be able to fall asleep.    Review of Systems See HPI   Past Medical History:  Diagnosis Date   BPH (benign prostatic hypertrophy)    Chronic atrial fibrillation [employer])    cardiologist--   dr berry   COPD (chronic obstructive pulmonary disease) (HCC)    Diverticulosis of colon    MODERATE LEFT SIDE   Epididymal cyst    w/ epididymalitis   Full dentures    Gout    Hand foot syndrome    secondary to chemotherapy (Xeloda)   cold hands/feet   Hearing loss    History of alcohol abuse    quit drinking in the mid 80's   History of cirrhosis of liver    alcoholic--  hx  alcohol abuse -- quit drinking 1980's   History of gout    History of melanoma excision    2013--  left ear lobe and back of hand   History of pancreatitis    2008   History of rectal cancer oncologist-  dr Truett Perna--  no recurrence   dx Sept 2009--  Stage II (T3N0)  s/p  sigmoid colectomy & low anterior resection 04-18-2008  and chemoradiation 2010   History of shingles 07/27/2010   History of squamous cell carcinoma excision    left lower leg   Hyperlipidemia    Hypertension    Loose stools    DUE TO ANTIBIOTICS   Macular degeneration    not sure which eye   OA (osteoarthritis)    RBBB (right bundle branch block)    Renal lesion    chronic-- left side   Sciatica of right side    Urge urinary incontinence    intermittant    Social History   Socioeconomic History   Marital status: Widowed    Spouse name: Not on file   Number of children: 6   Years of education: Not on file   Highest education level: High school graduate  Occupational History   Occupation: Retired    Associate Professor: RETIRED  Tobacco Use   Smoking status: Former    Current packs/day: 0.00    Average packs/day: 1 pack/day for 55.0 years (55.0  ttl pk-yrs)    Types: Cigarettes    Start date: 03/20/1953    Quit date: 03/20/2008    Years since quitting: 14.7   Smokeless tobacco: Never  Vaping Use   Vaping status: Never Used  Substance and Sexual Activity   Alcohol use: No    Comment: hx alcohol abuse -- quit in 1980's    Drug use: No   Sexual activity: Not on file  Other Topics Concern   Not on file  Social History Narrative   Married. 6 kids. Wife Claris Che also a patient of Dr. Pamala Hurry.       Retired.    Social Determinants of Health   Financial Resource Strain: Not on file  Food Insecurity: No Food Insecurity (12/26/2021)   Hunger Vital Sign    Worried About Running Out of Food in the Last Year: Never true    Ran Out of Food in the Last Year: Never true  Transportation Needs: No Transportation  Needs (12/26/2021)   PRAPARE - Administrator, Civil Service (Medical): No    Lack of Transportation (Non-Medical): No  Physical Activity: Inactive (12/26/2021)   Exercise Vital Sign    Days of Exercise per Week: 0 days    Minutes of Exercise per Session: 0 min  Stress: Not on file  Social Connections: Socially Isolated (12/26/2021)   Social Connection and Isolation Panel [NHANES]    Frequency of Communication with Friends and Family: Once a week    Frequency of Social Gatherings with Friends and Family: Once a week    Attends Religious Services: Never    Database administrator or Organizations: No    Attends Banker Meetings: Never    Marital Status: Widowed  Intimate Partner Violence: Not At Risk (12/26/2021)   Humiliation, Afraid, Rape, and Kick questionnaire    Fear of Current or Ex-Partner: No    Emotionally Abused: No    Physically Abused: No    Sexually Abused: No    Past Surgical History:  Procedure Laterality Date   CARDIAC CATHETERIZATION  2001  approx.  in [place]   CATARACT EXTRACTION W/ INTRAOCULAR LENS  IMPLANT, BILATERAL     COLONOSCOPY  last one 06-27-2012   EXPLORATORY LAPAROTOMY/  LOW ANTERIOR RESECTION/  SIGMOID COLECTOMY  04-18-2008   DR INGRAM   INCISION AND DRAINAGE ABSCESS N/A 11/02/2014   Procedure: INCISION AND DRAINAGE OF SCROTUM;  Surgeon: Crist Fat, MD;  Location: [facility];  Service: Urology;  Laterality: N/A;   INGUINAL HERNIA REPAIR Right 03/23/2014   Procedure: HERNIA REPAIR INGUINAL ADULT OPEN REPAIR RIGHT INGUINAL HERNIA REPAIR;  Surgeon: Claud Kelp, MD;  Location: [facility] OR;  Service: General;  Laterality: Right;   INSERTION OF MESH Right 03/23/2014   Procedure: INSERTION OF MESH;  Surgeon: Claud Kelp, MD;  Location: [facility] OR;  Service: General;  Laterality: Right;   LAPAROSCOPIC CHOLECYSTECTOMY  1990's   MASS EXCISION N/A 11/02/2014   Procedure: EXCISION OF EPIDIDYMAL CYST;  Surgeon: Crist Fat,  MD;  [facility];  Service: Urology;  Laterality: N/A;   TONSILLECTOMY  as child   TRANSTHORACIC ECHOCARDIOGRAM  05-11-2008   pseudonormal LV filling pattern,  ef 60-65%/  mild to moderate MV calcification no stenosis w/ mild to moderate regurg./  mild LAE and RAE/  mild TR    Family History  Problem Relation Age of Onset   Hypertension Mother    Lymphoma Father    Lymphoma  Other    Stroke Other        1st degree relative   Colon cancer Neg Hx    Esophageal cancer Neg Hx    Rectal cancer Neg Hx    Stomach cancer Neg Hx     No Known Allergies  Current Outpatient Medications on File Prior to Visit  Medication Sig Dispense Refill   albuterol (VENTOLIN HFA) 108 (90 Base) MCG/ACT inhaler Inhale 2 puffs into the lungs every 6 (six) hours as needed for wheezing or shortness of breath. 1 each 2   allopurinol (ZYLOPRIM) 300 MG tablet TAKE ONE-HALF TABLET BY MOUTH IN THE EVENING 45 tablet 1   ALPRAZolam (XANAX) 0.25 MG tablet Take 1 tablet (0.25 mg total) by mouth 2 (two) times daily as needed for anxiety (with travel. no ambien on any day you take this). 4 tablet 0   amLODipine (NORVASC) 5 MG tablet TAKE 1 TABLET (5 MG TOTAL) BY MOUTH DAILY. FOR BLOOD PRESSURE AND POSSIBLE RAYNAUDS 90 tablet 1   ammonium lactate (LAC-HYDRIN) 12 % lotion PLEASE SEE ATTACHED FOR DETAILED DIRECTIONS     fluocinonide (LIDEX) 0.05 % external solution APPLY 1 ML TO AFFECTED AREAS OF SCALP AND CHEST NIGHTLY 60 mL 3   fluticasone (FLONASE) 50 MCG/ACT nasal spray Place 2 sprays into both nostrils daily. 48 g 3   furosemide (LASIX) 20 MG tablet TAKE 1 TABLET BY MOUTH EVERY DAY AS NEEDED 90 tablet 1   Multiple Vitamin (MULTIVITAMIN) tablet Take 1 tablet by mouth daily.     predniSONE (DELTASONE) 20 MG tablet Take 1 tablet by mouth daily for 5 days, then 1/2 tablet daily for 2 days 6 tablet 0   tamsulosin (FLOMAX) 0.4 MG CAPS capsule Take 1 capsule (0.4 mg total) by mouth daily. 90 capsule 3    terbinafine (LAMISIL) 250 MG tablet Take 250 mg by mouth daily.     triamterene-hydrochlorothiazide (MAXZIDE-25) 37.5-25 MG tablet TAKE 1/2 TABLET BY MOUTH DAILY 45 tablet 1   trimethoprim-polymyxin b (POLYTRIM) ophthalmic solution Place 1 drop into both eyes every 4 (four) hours. 10 mL 0   warfarin (COUMADIN) 2.5 MG tablet TAKE 1 TABLET DAILY EXCEPT TAKE 1 AND 1/2 TABLETS ON WED AND SAT OR TAKE AS DIRECTED BY CLINIC (Patient taking differently: TAKE 1 TABLET DAILY OR AS DIRECTED BY ANTICOAGULATION CLINIC) 120 tablet 1   zolpidem (AMBIEN) 5 MG tablet TAKE 1 TABLET BY MOUTH AT BEDTIME AS NEEDED FOR SLEEP 30 tablet 5   No current facility-administered medications on file prior to visit.    BP 120/78   Pulse (!) 57   Temp 97.6 F (36.4 C) (Oral)   Ht 5\' 10"  (1.778 m)   Wt 167 lb (75.8 kg)   SpO2 96%   BMI 23.96 kg/m       Objective:   Physical Exam Vitals and nursing note reviewed.  Constitutional:      Appearance: Normal appearance.  Cardiovascular:     Rate and Rhythm: Normal rate and regular rhythm.     Pulses: Normal pulses.     Heart sounds: Normal heart sounds.     Comments: + weeping of clear fluid around let ankle.  Pulmonary:     Effort: Pulmonary effort is normal.     Breath sounds: Normal breath sounds.  Musculoskeletal:        General: Normal range of motion.     Right lower leg: 2+ Pitting Edema present.     Left lower leg: 2+ Pitting  Edema present.  Skin:    General: Skin is warm and dry.  Neurological:     General: No focal deficit present.     Mental Status: He is alert and oriented to person, place, and time.  Psychiatric:        Mood and Affect: Mood normal.        Behavior: Behavior normal.        Thought Content: Thought content normal.        Assessment & Plan:  1. Lower extremity edema - Will send in Lasix for him. Advised to take for three days in a row and the stop.  - Follow up with PCP  - furosemide (LASIX) 20 MG tablet; Take 1 tablet (20  mg total) by mouth daily as needed.  Dispense: 30 tablet; Refill: 0  2. Insomnia, unspecified type - Advised since he is on a controlled substance for sleep that I could not change that. He will need to follow up with his PCP    Shirline Frees, NP

## 2022-12-23 NOTE — Patient Instructions (Signed)
I am going to put you back on Lasix. Take for three days at a time for swelling in the legs.

## 2022-12-30 MED ORDER — POLYMYXIN B-TRIMETHOPRIM 10000-0.1 UNIT/ML-% OP SOLN
1.0000 [drp] | OPHTHALMIC | 0 refills | Status: DC
Start: 2022-12-30 — End: 2023-03-24

## 2023-01-11 ENCOUNTER — Ambulatory Visit: Payer: Medicare Other | Admitting: Family Medicine

## 2023-01-11 ENCOUNTER — Ambulatory Visit
Admission: RE | Admit: 2023-01-11 | Discharge: 2023-01-11 | Disposition: A | Discharge status: Home / Self Care | Payer: Medicare Other | Source: Ambulatory Visit | Attending: Family Medicine | Admitting: Family Medicine

## 2023-01-11 ENCOUNTER — Encounter: Payer: Self-pay | Admitting: Family Medicine

## 2023-01-11 VITALS — BP 122/70 | HR 59 | Temp 97.7°F | Ht 70.0 in | Wt 168.0 lb

## 2023-01-11 DIAGNOSIS — R140 Abdominal distension (gaseous): Secondary | ICD-10-CM

## 2023-01-11 DIAGNOSIS — Z131 Encounter for screening for diabetes mellitus: Secondary | ICD-10-CM | POA: Diagnosis not present

## 2023-01-11 DIAGNOSIS — R739 Hyperglycemia, unspecified: Secondary | ICD-10-CM | POA: Diagnosis not present

## 2023-01-11 DIAGNOSIS — I4891 Unspecified atrial fibrillation: Secondary | ICD-10-CM

## 2023-01-11 DIAGNOSIS — I10 Essential (primary) hypertension: Secondary | ICD-10-CM | POA: Diagnosis not present

## 2023-01-11 DIAGNOSIS — R609 Edema, unspecified: Secondary | ICD-10-CM

## 2023-01-11 LAB — COMPREHENSIVE METABOLIC PANEL
ALT: 12 U/L (ref 0–53)
AST: 21 U/L (ref 0–37)
Albumin: 4.1 g/dL (ref 3.5–5.2)
Alkaline Phosphatase: 68 U/L (ref 39–117)
BUN: 27 mg/dL — ABNORMAL HIGH (ref 6–23)
CO2: 30 mEq/L (ref 19–32)
Calcium: 9.4 mg/dL (ref 8.4–10.5)
Chloride: 104 mEq/L (ref 96–112)
Creatinine, Ser: 1.12 mg/dL (ref 0.40–1.50)
GFR: 55.94 mL/min — ABNORMAL LOW (ref >60.00)
Glucose, Bld: 116 mg/dL — ABNORMAL HIGH (ref 70–99)
Potassium: 4.1 mEq/L (ref 3.5–5.1)
Sodium: 141 mEq/L (ref 135–145)
Total Bilirubin: 0.5 mg/dL (ref 0.2–1.2)
Total Protein: 6.7 g/dL (ref 6.0–8.3)

## 2023-01-11 LAB — CBC WITH DIFFERENTIAL/PLATELET
Basophils Absolute: 0 10*3/uL (ref 0.0–0.1)
Basophils Relative: 0.6 % (ref 0.0–3.0)
Eosinophils Absolute: 0.1 10*3/uL (ref 0.0–0.7)
Eosinophils Relative: 2.1 % (ref 0.0–5.0)
HCT: 44 % (ref 39.0–52.0)
Hemoglobin: 14.4 g/dL (ref 13.0–17.0)
Lymphocytes Relative: 21.8 % (ref 12.0–46.0)
Lymphs Abs: 1.3 10*3/uL (ref 0.7–4.0)
MCHC: 32.7 g/dL (ref 30.0–36.0)
MCV: 92.1 fl (ref 78.0–100.0)
Monocytes Absolute: 0.7 10*3/uL (ref 0.1–1.0)
Monocytes Relative: 11.1 % (ref 3.0–12.0)
Neutro Abs: 3.8 10*3/uL (ref 1.4–7.7)
Neutrophils Relative %: 64.4 % (ref 43.0–77.0)
Platelets: 143 10*3/uL — ABNORMAL LOW (ref 150.0–400.0)
RBC: 4.77 Mil/uL (ref 4.22–5.81)
RDW: 14.7 % (ref 11.5–15.5)
WBC: 5.9 10*3/uL (ref 4.0–10.5)

## 2023-01-11 LAB — BRAIN NATRIURETIC PEPTIDE: Pro B Natriuretic peptide (BNP): 188 pg/mL — ABNORMAL HIGH (ref 0.0–100.0)

## 2023-01-11 LAB — HEMOGLOBIN A1C: Hgb A1c MFr Bld: 6.6 % — ABNORMAL HIGH (ref 4.6–6.5)

## 2023-01-11 NOTE — Progress Notes (Signed)
Phone [phone] In person visit   Subjective:   William Mahoney is a 89 y.o. year old very pleasant male patient who presents for/with See problem oriented charting Chief Complaint  Patient presents with   Bloated    Pt c/o abd bloating   Leg Swelling    Pt still experiencing leg weeping.    Past Medical History-  Patient Active Problem List   Diagnosis Date Noted   AAA (abdominal aortic aneurysm) without rupture 11/20/2019    Priority: High   Edema 11/10/2016    Priority: High   Atrial fibrillation (HCC) 12/07/2008    Priority: High   Hyperglycemia 06/25/2021    Priority: Medium    PAD (peripheral artery disease) (HCC) 11/20/2019    Priority: Medium    Chronic kidney disease (CKD), stage III (moderate) (HCC) 02/15/2019    Priority: Medium    Aortic atherosclerosis (HCC) 01/05/2019    Priority: Medium    Insomnia 10/19/2014    Priority: Medium    Gout 01/09/2014    Priority: Medium    History of cancer of rectosigmoid junction 01/04/2008    Priority: Medium    Former smoker 06/23/2007    Priority: Medium    Hyperlipidemia, unspecified 02/09/2007    Priority: Medium    Essential hypertension 12/07/2006    Priority: Medium    COPD (chronic obstructive pulmonary disease) (HCC) 12/07/2006    Priority: Medium    BPH (benign prostatic hyperplasia) 12/07/2006    Priority: Medium    Encounter for therapeutic drug monitoring 08/27/2014    Priority: Low   Allergic rhinitis 08/15/2013    Priority: Low   Arthritis of lumbar spine 01/20/2013    Priority: Low   Inguinal hernia 03/21/2010    Priority: Low   BASAL CELL CARCINOMA SKIN LOWER LIMB INCL HIP 03/21/2010    Priority: Low   Actinic keratosis 12/07/2008    Priority: Low   TMJ (temporomandibular joint disorder) 02/09/2007    Priority: Low   URINARY INCONTINENCE 12/07/2006    Priority: Low   PANCREATITIS, HX OF 12/07/2006    Priority: Low   Porokeratosis 11/25/2021    Medications- reviewed and  updated Current Outpatient Medications  Medication Sig Dispense Refill   acetic acid-hydrocortisone (VOSOL-HC) OTIC solution Place 3 drops into both ears 3 (three) times daily. 10 mL 3   albuterol (VENTOLIN HFA) 108 (90 Base) MCG/ACT inhaler Inhale 2 puffs into the lungs every 6 (six) hours as needed for wheezing or shortness of breath. 1 each 2   allopurinol (ZYLOPRIM) 300 MG tablet TAKE ONE-HALF TABLET BY MOUTH IN THE EVENING 45 tablet 1   amLODipine (NORVASC) 5 MG tablet TAKE 1 TABLET (5 MG TOTAL) BY MOUTH DAILY. FOR BLOOD PRESSURE AND POSSIBLE RAYNAUDS 90 tablet 1   ammonium lactate (LAC-HYDRIN) 12 % lotion PLEASE SEE ATTACHED FOR DETAILED DIRECTIONS     fluocinonide (LIDEX) 0.05 % external solution APPLY 1 ML TO AFFECTED AREAS OF SCALP AND CHEST NIGHTLY 60 mL 3   fluticasone (FLONASE) 50 MCG/ACT nasal spray Place 2 sprays into both nostrils daily. 48 g 3   furosemide (LASIX) 20 MG tablet Take 1 tablet (20 mg total) by mouth daily as needed. 30 tablet 0   Multiple Vitamin (MULTIVITAMIN) tablet Take 1 tablet by mouth daily.     tamsulosin (FLOMAX) 0.4 MG CAPS capsule Take 1 capsule (0.4 mg total) by mouth daily. 90 capsule 3   triamterene-hydrochlorothiazide (MAXZIDE-25) 37.5-25 MG tablet TAKE 1/2 TABLET BY MOUTH DAILY 45 tablet  1   trimethoprim-polymyxin b (POLYTRIM) ophthalmic solution Place 1 drop into both eyes every 4 (four) hours. 10 mL 0   warfarin (COUMADIN) 2.5 MG tablet TAKE 1 TABLET DAILY EXCEPT TAKE 1 AND 1/2 TABLETS ON WED AND SAT OR TAKE AS DIRECTED BY CLINIC (Patient taking differently: TAKE 1 TABLET DAILY OR AS DIRECTED BY ANTICOAGULATION CLINIC) 120 tablet 1   zolpidem (AMBIEN) 5 MG tablet TAKE 1 TABLET BY MOUTH AT BEDTIME AS NEEDED FOR SLEEP (Patient not taking: Reported on 01/11/2023) 30 tablet 5   No current facility-administered medications for this visit.     Objective:  BP 122/70   Pulse (!) 59   Temp 97.7 F (36.5 C)   Ht 5\' 10"  (1.778 m)   Wt 168 lb (76.2 kg)    SpO2 97%   BMI 24.11 kg/m  Gen: NAD, resting comfortably CV: Bradycardic without murmur Lungs: CTAB no crackles, wheeze, rhonchi Abdomen: soft/nontender/bloated/distended/rather active bowel sounds. No rebound or guarding.  Ext: Trace edema on the right, 1+ on the left Skin: warm, dry    Assessment and Plan    #hypertension #Venous insufficiency S: medication: Amlodipine 2.5 mg--> 5 mg for help with Raynaud's- helpful, triamterene hydrochlorothiazide 37.5-25 mg, Lasix 20 mg as needed for edema - still swelling in legs but not weeping - did have weeping when legs got really swollen but did better with 3 days of lasix.  BP Readings from Last 3 Encounters:  01/11/23 122/70  12/23/22 120/78  10/16/22 112/62  A/P: Blood pressure well-controlled-continue current medication - With venous insufficiency and edema considered reducing amlodipine but he has enjoyed the benefits for his Raynaud's and wants to hold steady-instead can use Lasix if swells up in the future - With abdominal bloating suggested could do a few days of Lasix as well  # Abdominal bloating S:in last day or two he noted some abdominal bloating despite eating normally. No abdominal pain. Still having bowel movement everyday- no melena or bright red blood per rectum. No pain.  Has increased water intake. Not passing gas or bleching. No weight loss/fever/diarrhea.  - he does have rather firm stool- he prefers that A/P: bloating new onset without pain rather sudden- we opted to get x-ray (possible obstruction even partial?  But no pain)- hed like to do this before bowel clean out attempt but I strongly wonder about constipation  -could also trial lasix for few days- could be swelling related -We will update labs including BNP-would consider repeating echocardiogram from several years ago if elevated   # Hyperglycemia/insulin resistance/prediabetes S:  Medication: None Lab Results  Component Value Date   HGBA1C 6.5 05/04/2022    HGBA1C 6.3 06/24/2021   A/P: Check A1c today with labs-if 6.5 or above would need to diagnose as having diabetes    # Atrial fibrillation-follows with Dr. Allyson Sabal of cardiology S: Rate controlled with no Prescription -Heart rate maintained without metoprolol Anticoagulated with Coumadin-follows in our clinic A/P: appropriately anticoagulated and rate controlled- continue current medicine -He asks if he can take vitamin K for easy bruising/bleeding and discussed absolutely not with Coumadin  Recommended follow up: Return for next already scheduled visit or sooner if needed. Future Appointments  Date Time Provider Department Center  01/13/2023 10:30 AM [facility]  03/24/2023 11:20 AM Durene Cal, Aldine Contes, MD [facility]    Lab/Order associations:   ICD-10-CM   1. Abdominal bloating  R14.0 DG Abd 1 View    2. Essential hypertension  I10  CBC with Differential/Platelet    Comprehensive metabolic panel    3. Hyperglycemia  R73.9 Hemoglobin A1c    4. Screening for diabetes mellitus  Z13.1 Hemoglobin A1c    5. Edema, unspecified type  R60.9 B Nat Peptide    6. Atrial fibrillation, unspecified type (HCC)  I48.91       No orders of the defined types were placed in this encounter.   Return precautions advised.  Tana Conch, MD

## 2023-01-11 NOTE — Patient Instructions (Addendum)
bloating new onset without pain rather sudden- we opted to get x-ray- hed like to do this before bowel clean out attempt but I strongly wonder about constipation  -could also trial lasix for few days- could be swelling related  I like your heating pad idea and perhaps reducing cheese/milk etc  Could also try 1 capful of miralax in bottle of water per day for a few days to see if that helps  Please go to [facility] - located [address] across the street from [place] - in the basement - Hours: 8:30-5:00 PM M-F (with lunch from 12:30- 1 PM). You do NOT need an appointment.    Please stop by lab before you go If you have mychart- we will send your results within 3 business days of [place] receiving them.  If you do not have mychart- we will call you about results within 5 business days of [place] receiving them.  *please also note that you will see labs on mychart as soon as they post. I will later go in and write notes on them- will say "notes from Dr. Durene Cal"   Recommended follow up: Return for next already scheduled visit or sooner if needed.

## 2023-01-13 ENCOUNTER — Ambulatory Visit: Payer: Medicare Other

## 2023-01-14 ENCOUNTER — Encounter: Payer: Self-pay | Admitting: Family Medicine

## 2023-01-15 ENCOUNTER — Other Ambulatory Visit: Payer: Self-pay | Admitting: Adult Health

## 2023-01-15 ENCOUNTER — Other Ambulatory Visit: Payer: Self-pay | Admitting: Family Medicine

## 2023-01-15 DIAGNOSIS — R600 Localized edema: Secondary | ICD-10-CM

## 2023-01-20 ENCOUNTER — Ambulatory Visit: Payer: Medicare Other

## 2023-01-27 ENCOUNTER — Ambulatory Visit (INDEPENDENT_AMBULATORY_CARE_PROVIDER_SITE_OTHER): Payer: Medicare Other

## 2023-01-27 DIAGNOSIS — Z7901 Long term (current) use of anticoagulants: Secondary | ICD-10-CM

## 2023-01-27 LAB — POCT INR: INR: 2.1 (ref 2.0–3.0)

## 2023-01-27 NOTE — Patient Instructions (Addendum)
Pre visit review using our clinic review tool, if applicable. No additional management support is needed unless otherwise documented below in the visit note.  Continue 1 tablet daily. Recheck in 6 weeks at [place].

## 2023-01-27 NOTE — Progress Notes (Signed)
Continue 1 tablet daily. Recheck in 6 weeks at [place].

## 2023-01-27 NOTE — Progress Notes (Signed)
I have reviewed the patient's encounter and agree with the documentation.  Katina Degree. Jimmey Ralph, MD 01/27/2023 10:27 AM

## 2023-02-01 ENCOUNTER — Other Ambulatory Visit: Payer: Self-pay | Admitting: Family Medicine

## 2023-02-01 DIAGNOSIS — Z5181 Encounter for therapeutic drug level monitoring: Secondary | ICD-10-CM

## 2023-02-02 NOTE — Telephone Encounter (Signed)
Refill request

## 2023-02-02 NOTE — Telephone Encounter (Signed)
Pt is compliant with warfarin management and PCP apts.  Sent in refill of warfarin to requested pharmacy.      

## 2023-02-09 ENCOUNTER — Other Ambulatory Visit: Payer: Self-pay | Admitting: Family Medicine

## 2023-03-10 ENCOUNTER — Ambulatory Visit (INDEPENDENT_AMBULATORY_CARE_PROVIDER_SITE_OTHER): Payer: Medicare Other

## 2023-03-10 DIAGNOSIS — Z7901 Long term (current) use of anticoagulants: Secondary | ICD-10-CM | POA: Diagnosis not present

## 2023-03-10 LAB — POCT INR: INR: 1.8 — AB (ref 2.0–3.0)

## 2023-03-10 NOTE — Progress Notes (Signed)
Increase dose today to take 1 1/2 tablets today and then continue 1 tablet daily. Recheck in 2 weeks.

## 2023-03-10 NOTE — Patient Instructions (Addendum)
Pre visit review using our clinic review tool, if applicable. No additional management support is needed unless otherwise documented below in the visit note.  Increase dose today to take 1 1/2 tablets today and then continue 1 tablet daily. Recheck in 2 weeks.

## 2023-03-16 ENCOUNTER — Ambulatory Visit: Payer: Medicare Other | Admitting: Family Medicine

## 2023-03-16 ENCOUNTER — Encounter: Payer: Self-pay | Admitting: Family Medicine

## 2023-03-16 VITALS — BP 135/81 | HR 68 | Temp 97.6°F | Resp 18 | Ht 70.0 in | Wt 167.0 lb

## 2023-03-16 DIAGNOSIS — H60312 Diffuse otitis externa, left ear: Secondary | ICD-10-CM

## 2023-03-16 DIAGNOSIS — H65195 Other acute nonsuppurative otitis media, recurrent, left ear: Secondary | ICD-10-CM | POA: Diagnosis not present

## 2023-03-16 DIAGNOSIS — H6122 Impacted cerumen, left ear: Secondary | ICD-10-CM | POA: Diagnosis not present

## 2023-03-16 DIAGNOSIS — H7292 Unspecified perforation of tympanic membrane, left ear: Secondary | ICD-10-CM

## 2023-03-16 MED ORDER — NEOMYCIN-POLYMYXIN-HC 3.5-10000-1 OT SOLN: 4.0000 [drp] | Freq: Three times a day (TID) | OTIC | 0 refills | Status: DC

## 2023-03-16 MED ORDER — CEFDINIR 300 MG PO CAPS: 300.0000 mg | ORAL_CAPSULE | Freq: Two times a day (BID) | ORAL | 0 refills | Status: DC

## 2023-03-16 NOTE — Progress Notes (Signed)
Subjective:    Patient ID: William Mahoney, male    DOB: [birth year], 89 y.o.   MRN: [id number]  Chief Complaint  Patient presents with   Ear Pain    Left ear pain that started a while ago, getting bad for the last week    HPI - Pt is accompanied by his daughter.   Ear ache, Left - Pt has hx of recurrent ear pain that has recently worsened in the last couple of days. He notes his ear was clogged a few days ago. His daughter reports he was holding his ear all day yesterday due to the significant ear pain. He reports pain when using his prescription acetic acid-hydrocortisone ear drops yesterday and today. He has also tried a warm compress to relieve his pain. He reports relief with occasional ear drainage, but has not had ear drainage in the last couple of days. No new rhinorrhea, congestion, headache or dizziness. Has previously been seen by an ENT in [facility], but are not interested in returning to that location. Follow up with Dr. Durene Cal is scheduled for 12/4.    Health Maintenance Due  Topic Date Due   Medicare Annual Wellness (AWV)  12/27/2022   Past Medical History:  Diagnosis Date   BPH (benign prostatic hypertrophy)    Chronic atrial fibrillation [facility])    cardiologist--   dr berry   COPD (chronic obstructive pulmonary disease) (HCC)    Diverticulosis of colon    MODERATE LEFT SIDE   Epididymal cyst    w/ epididymalitis   Full dentures    Gout    Hand foot syndrome    secondary to chemotherapy (Xeloda)   cold hands/feet   Hearing loss    History of alcohol abuse    quit drinking in the mid 80's   History of cirrhosis of liver    alcoholic--  hx alcohol abuse -- quit drinking 1980's   History of gout    History of melanoma excision    2013--  left ear lobe and back of hand   History of pancreatitis    2008   History of rectal cancer oncologist-  dr Truett Perna--  no recurrence   dx Sept 2009--  Stage II (T3N0)  s/p  sigmoid colectomy & low anterior resection 04-18-2008   and chemoradiation 2010   History of shingles 07/27/2010   History of squamous cell carcinoma excision    left lower leg   Hyperlipidemia    Hypertension    Loose stools    DUE TO ANTIBIOTICS   Macular degeneration    not sure which eye   OA (osteoarthritis)    RBBB (right bundle branch block)    Renal lesion    chronic-- left side   Sciatica of right side    Urge urinary incontinence    intermittant   Past Surgical History:  Procedure Laterality Date   CARDIAC CATHETERIZATION  2001  approx.  in [place]   CATARACT EXTRACTION W/ INTRAOCULAR LENS  IMPLANT, BILATERAL     COLONOSCOPY  last one 06-27-2012   EXPLORATORY LAPAROTOMY/  LOW ANTERIOR RESECTION/  SIGMOID COLECTOMY  04-18-2008   DR INGRAM   INCISION AND DRAINAGE ABSCESS N/A 11/02/2014   Procedure: INCISION AND DRAINAGE OF SCROTUM;  Surgeon: Crist Fat, MD;  Location: [facility];  Service: Urology;  Laterality: N/A;   INGUINAL HERNIA REPAIR Right 03/23/2014   Procedure: HERNIA REPAIR INGUINAL ADULT OPEN REPAIR RIGHT INGUINAL HERNIA REPAIR;  Surgeon: Mikey Bussing  Derrell Lolling, MD;  Location: [facility] OR;  Service: General;  Laterality: Right;   INSERTION OF MESH Right 03/23/2014   Procedure: INSERTION OF MESH;  Surgeon: Claud Kelp, MD;  Location[facility] OR;  Service: General;  Laterality: Right;   LAPAROSCOPIC CHOLECYSTECTOMY  1990's   MASS EXCISION N/A 11/02/2014   Procedure: EXCISION OF EPIDIDYMAL CYST;  Surgeon: Crist Fat, MD;  Location: [facility];  Service: Urology;  Laterality: N/A;   TONSILLECTOMY  as child   TRANSTHORACIC ECHOCARDIOGRAM  05-11-2008   pseudonormal LV filling pattern,  ef 60-65%/  mild to moderate MV calcification no stenosis w/ mild to moderate regurg./  mild LAE and RAE/  mild TR   Current Outpatient Medications:    acetic acid-hydrocortisone (VOSOL-HC) OTIC solution, Place 3 drops into both ears 3 (three) times daily., Disp: 10 mL, Rfl: 3   albuterol (VENTOLIN HFA) 108  (90 Base) MCG/ACT inhaler, Inhale 2 puffs into the lungs every 6 (six) hours as needed for wheezing or shortness of breath., Disp: 1 each, Rfl: 2   allopurinol (ZYLOPRIM) 300 MG tablet, TAKE ONE-HALF TABLET BY MOUTH IN THE EVENING, Disp: 45 tablet, Rfl: 1   amLODipine (NORVASC) 5 MG tablet, TAKE 1 TABLET (5 MG TOTAL) BY MOUTH DAILY. FOR BLOOD PRESSURE AND POSSIBLE RAYNAUDS, Disp: 90 tablet, Rfl: 1   ammonium lactate (LAC-HYDRIN) 12 % lotion, PLEASE SEE ATTACHED FOR DETAILED DIRECTIONS, Disp: , Rfl:    cefdinir (OMNICEF) 300 MG capsule, Take 1 capsule (300 mg total) by mouth 2 (two) times daily., Disp: 14 capsule, Rfl: 0   fluocinonide (LIDEX) 0.05 % external solution, APPLY 1 ML TO AFFECTED AREAS OF SCALP AND CHEST NIGHTLY, Disp: 60 mL, Rfl: 3   fluticasone (FLONASE) 50 MCG/ACT nasal spray, Place 2 sprays into both nostrils daily., Disp: 48 g, Rfl: 3   furosemide (LASIX) 20 MG tablet, TAKE 1 TABLET BY MOUTH EVERY DAY AS NEEDED, Disp: 90 tablet, Rfl: 1   metoprolol tartrate (LOPRESSOR) 25 MG tablet, Take by mouth., Disp: , Rfl:    Multiple Vitamin (MULTIVITAMIN) tablet, Take 1 tablet by mouth daily., Disp: , Rfl:    neomycin-polymyxin-hydrocortisone (CORTISPORIN) OTIC solution, Place 4 drops into both ears 3 (three) times daily., Disp: 10 mL, Rfl: 0   tamsulosin (FLOMAX) 0.4 MG CAPS capsule, Take 1 capsule (0.4 mg total) by mouth daily., Disp: 90 capsule, Rfl: 3   triamterene-hydrochlorothiazide (MAXZIDE-25) 37.5-25 MG tablet, TAKE 1/2 TABLET BY MOUTH DAILY, Disp: 45 tablet, Rfl: 1   trimethoprim-polymyxin b (POLYTRIM) ophthalmic solution, Place 1 drop into both eyes every 4 (four) hours., Disp: 10 mL, Rfl: 0   warfarin (COUMADIN) 2.5 MG tablet, TAKE 1 TABLET BY MOUTH DAILY OR AS DIRECTED BY ANTICOAGULATION CLINIC, Disp: 110 tablet, Rfl: 1   zolpidem (AMBIEN) 5 MG tablet, TAKE 1 TABLET BY MOUTH AT BEDTIME AS NEEDED FOR SLEEP, Disp: 30 tablet, Rfl: 5  No Known Allergies ROS neg/noncontributory except  as noted HPI/below    Objective:    BP 135/81   Pulse 68   Temp 97.6 F (36.4 C) (Temporal)   Resp 18   Ht 5\' 10"  (1.778 m)   Wt 167 lb (75.8 kg)   SpO2 97%   BMI 23.96 kg/m  Wt Readings from Last 3 Encounters:  03/16/23 167 lb (75.8 kg)  01/11/23 168 lb (76.2 kg)  12/23/22 167 lb (75.8 kg)   Physical Exam   Gen: WDWN NAD HEENT: NCAT, conjunctiva not injected, sclera nonicteric MSK: no gross abnormalities.  Using cane NEURO: A&O x3.  CN II-XII intact.  PSYCH: normal mood. Good eye contact  Procedure: Cerumen Disimpaction Verbal informed consent obtained from pt.  Hydrogen peroxide drops applied and gentle ear lavage performed. There were no complications and following the disimpaction the tympanic membrane visible. Impacted cerumen in left ear canal. White debris in ear canal. After irrigation bottom of the TM had a hole. Auditory canals are normal The patient reported relief of symptoms after removal of cerumen. Pt tolerated well.     Assessment & Plan:  Acute diffuse otitis externa of left ear -     Ambulatory referral to ENT  Other recurrent acute nonsuppurative otitis media of left ear -     Ambulatory referral to ENT  Rupture of left tympanic membrane -     Ambulatory referral to ENT  Impacted cerumen of left ear  Other orders -     Neomycin-Polymyxin-HC; Place 4 drops into both ears 3 (three) times daily.  Dispense: 10 mL; Refill: 0 -     Cefdinir; Take 1 capsule (300 mg total) by mouth 2 (two) times daily.  Dispense: 14 capsule; Refill: 0   OE L ear-cortisporin otic.  Keeps recurring, suspect some from possible chronic hole in TM LOM-recurrent.  Not sure if chronic hole/rupture, or rupture from infection.  Will do omnicef 300mg  bid orally.  Refer ENT as recurrent   Return if symptoms worsen or fail to improve.  I, Isabelle Course, acting as a scribe for Angelena Sole, MD., have documented all relevant documentation on the behalf of Angelena Sole, MD, as directed  by  Angelena Sole, MD while in the presence of Angelena Sole, MD.  I, Angelena Sole, MD, have reviewed all documentation for this visit. The documentation on 03/16/23 for the exam, diagnosis, procedures, and orders are all accurate and complete.  Angelena Sole, MD

## 2023-03-16 NOTE — Patient Instructions (Signed)
Drops sent in Antibiotic pills  Referral to ENT

## 2023-03-17 ENCOUNTER — Other Ambulatory Visit: Payer: Self-pay | Admitting: Family Medicine

## 2023-03-17 ENCOUNTER — Telehealth: Payer: Self-pay | Admitting: Family Medicine

## 2023-03-17 DIAGNOSIS — H1033 Unspecified acute conjunctivitis, bilateral: Secondary | ICD-10-CM

## 2023-03-17 NOTE — Telephone Encounter (Signed)
Prescription Request  03/17/2023  LOV: 01/11/2023  What is the name of the medication or equipment? zolpidem (AMBIEN) 5 MG tablet  STATES PT IS NOW OUT OF MEDICATION, PHARMACY DIDN'T INFORM THEM UNTIL TODAY THAT PT HAS NO MORE REFILLS.  Have you contacted your pharmacy to request a refill? Yes   Which pharmacy would you like this sent to?  [id number] [place], [place] - [address] [place] [address] Phone: [phone] Fax: [phone]    Patient notified that their request is being sent to the clinical staff for review and that they should receive a response within 2 business days.   Please advise at Mobile There is no such number on file (mobile).

## 2023-03-22 NOTE — Telephone Encounter (Signed)
Request submitted to Dr. Durene Cal for approval.

## 2023-03-24 ENCOUNTER — Encounter: Payer: Self-pay | Admitting: Family Medicine

## 2023-03-24 ENCOUNTER — Ambulatory Visit: Payer: Medicare Other | Admitting: Family Medicine

## 2023-03-24 ENCOUNTER — Ambulatory Visit: Payer: Medicare Other

## 2023-03-24 VITALS — BP 110/62 | HR 58 | Temp 97.6°F | Resp 12 | Ht 70.0 in | Wt 168.8 lb

## 2023-03-24 DIAGNOSIS — E119 Type 2 diabetes mellitus without complications: Secondary | ICD-10-CM | POA: Insufficient documentation

## 2023-03-24 DIAGNOSIS — I714 Abdominal aortic aneurysm, without rupture, unspecified: Secondary | ICD-10-CM

## 2023-03-24 DIAGNOSIS — I4891 Unspecified atrial fibrillation: Secondary | ICD-10-CM

## 2023-03-24 DIAGNOSIS — G4700 Insomnia, unspecified: Secondary | ICD-10-CM

## 2023-03-24 DIAGNOSIS — H60502 Unspecified acute noninfective otitis externa, left ear: Secondary | ICD-10-CM

## 2023-03-24 DIAGNOSIS — I10 Essential (primary) hypertension: Secondary | ICD-10-CM

## 2023-03-24 DIAGNOSIS — Z7901 Long term (current) use of anticoagulants: Secondary | ICD-10-CM

## 2023-03-24 LAB — POCT INR: INR: 1.8 — AB (ref 2.0–3.0)

## 2023-03-24 MED ORDER — CIPROFLOXACIN HCL 0.2 % OT SOLN: 0.2000 mL | Freq: Two times a day (BID) | OTIC | 0 refills | Status: DC

## 2023-03-24 MED ORDER — AMLODIPINE BESYLATE 5 MG PO TABS: 5.0000 mg | ORAL_TABLET | Freq: Every day | ORAL | 3 refills | Status: AC

## 2023-03-24 MED ORDER — ALLOPURINOL 300 MG PO TABS: ORAL_TABLET | ORAL | 3 refills | Status: DC

## 2023-03-24 NOTE — Progress Notes (Signed)
Pt was prescribed omnicef for an earache on 11/26 x 7 days. No interaction with warfarin. Pt also has PCP apt today. Increase dose today to take 1 1/2 tablets today and then change weekly dose to take 1 tablet daily except take 1 1/2 tablets on Wednesday. Recheck in 2 weeks.

## 2023-03-24 NOTE — Progress Notes (Signed)
I have reviewed and agree with note, evaluation, plan.   Jeffory Snelgrove, MD  

## 2023-03-24 NOTE — Assessment & Plan Note (Signed)
#   Diabetes- new diagnosis based on labs 05/04/22 and 01/11/23 S: Medication:none  Exercise and diet- discussed cutting down on sweets- feels could cut donuts Lab Results  Component Value Date   HGBA1C 6.6 (H) 01/11/2023   HGBA1C 6.5 05/04/2022   HGBA1C 6.3 06/24/2021   A/P: discussed possible diabetes education- wants to hold off. Controlled without medications- check next visit

## 2023-03-24 NOTE — Progress Notes (Signed)
Phone [phone] In person visit   Subjective:   William Mahoney is a 89 y.o. year old very pleasant male patient who presents for/with See problem oriented charting Chief Complaint  Patient presents with   FOLLOW UP EAR AND RAYNAUDS   Past Medical History-  Patient Active Problem List   Diagnosis Date Noted   Diabetes mellitus type 2, controlled (HCC) 03/24/2023    Priority: High   AAA (abdominal aortic aneurysm) without rupture 11/20/2019    Priority: High   Edema 11/10/2016    Priority: High   Atrial fibrillation (HCC) 12/07/2008    Priority: High   Hyperglycemia 06/25/2021    Priority: Medium    PAD (peripheral artery disease) (HCC) 11/20/2019    Priority: Medium    Chronic kidney disease (CKD), stage III (moderate) (HCC) 02/15/2019    Priority: Medium    Aortic atherosclerosis (HCC) 01/05/2019    Priority: Medium    Insomnia 10/19/2014    Priority: Medium    Gout 01/09/2014    Priority: Medium    History of cancer of rectosigmoid junction 01/04/2008    Priority: Medium    Former smoker 06/23/2007    Priority: Medium    Hyperlipidemia, unspecified 02/09/2007    Priority: Medium    Essential hypertension 12/07/2006    Priority: Medium    COPD (chronic obstructive pulmonary disease) (HCC) 12/07/2006    Priority: Medium    BPH (benign prostatic hyperplasia) 12/07/2006    Priority: Medium    Encounter for therapeutic drug monitoring 08/27/2014    Priority: Low   Allergic rhinitis 08/15/2013    Priority: Low   Arthritis of lumbar spine 01/20/2013    Priority: Low   Inguinal hernia 03/21/2010    Priority: Low   BASAL CELL CARCINOMA SKIN LOWER LIMB INCL HIP 03/21/2010    Priority: Low   Actinic keratosis 12/07/2008    Priority: Low   TMJ (temporomandibular joint disorder) 02/09/2007    Priority: Low   URINARY INCONTINENCE 12/07/2006    Priority: Low   History of digestive system disease 12/07/2006    Priority: Low   Porokeratosis 11/25/2021     Medications- reviewed and updated Current Outpatient Medications  Medication Sig Dispense Refill   albuterol (VENTOLIN HFA) 108 (90 Base) MCG/ACT inhaler Inhale 2 puffs into the lungs every 6 (six) hours as needed for wheezing or shortness of breath. 1 each 2   ammonium lactate (LAC-HYDRIN) 12 % lotion PLEASE SEE ATTACHED FOR DETAILED DIRECTIONS     Ciprofloxacin HCl 0.2 % otic solution Place 0.2 mLs into the left ear 2 (two) times daily. 14 each 0   fluocinonide (LIDEX) 0.05 % external solution APPLY 1 ML TO AFFECTED AREAS OF SCALP AND CHEST NIGHTLY 60 mL 3   fluticasone (FLONASE) 50 MCG/ACT nasal spray Place 2 sprays into both nostrils daily. 48 g 3   furosemide (LASIX) 20 MG tablet TAKE 1 TABLET BY MOUTH EVERY DAY AS NEEDED 90 tablet 1   Multiple Vitamin (MULTIVITAMIN) tablet Take 1 tablet by mouth daily.     tamsulosin (FLOMAX) 0.4 MG CAPS capsule Take 1 capsule (0.4 mg total) by mouth daily. 90 capsule 3   triamterene-hydrochlorothiazide (MAXZIDE-25) 37.5-25 MG tablet TAKE 1/2 TABLET BY MOUTH DAILY 45 tablet 1   warfarin (COUMADIN) 2.5 MG tablet TAKE 1 TABLET BY MOUTH DAILY OR AS DIRECTED BY ANTICOAGULATION CLINIC 110 tablet 1   zolpidem (AMBIEN) 5 MG tablet TAKE 1 TABLET BY MOUTH EVERY DAY AT BEDTIME AS NEEDED FOR SLEEP  30 tablet 5   allopurinol (ZYLOPRIM) 300 MG tablet TAKE ONE-HALF TABLET BY MOUTH IN THE EVENING 45 tablet 3   amLODipine (NORVASC) 5 MG tablet Take 1 tablet (5 mg total) by mouth daily. For blood pressure and possible Raynauds 90 tablet 3   No current facility-administered medications for this visit.     Objective:  BP 110/62 (BP Location: Left Arm, Cuff Size: Normal)   Pulse (!) 58   Temp 97.6 F (36.4 C) (Temporal)   Resp 12   Ht 5\' 10"  (1.778 m)   Wt 168 lb 12.8 oz (76.6 kg)   SpO2 96%   BMI 24.22 kg/m  Gen: NAD, resting comfortably Right tympanic membrane normal, left tympanic membrane with small perforation and lower portion.  Light amount of discharge  in the outer canal with minimal erythema-suspect much improved from last week.  Membrane itself with similar discharge noted-no significant erythema CV: irregularly irregular  Lungs: CTAB no crackles, wheeze, rhonchi Abdomen: soft/nontender/nondistended/normal bowel sounds. No rebound or guarding.  Ext: no edema Skin: warm, dry     Assessment and Plan   # left ear pain with recent otitis media and otitis externa S:seen last week by Dr. Ruthine Dose. Daughter has noted significant improvement- diagnosed with otitis externa and otitis media but rather significant so was given both drops and oral antibiotics (stopped Sunday with stomach discomfort so only 4.5 days) but still doing drops- there was a question of rupture so referred to ENT- they have not heard yet so giving # today to call -no hearing loss above baseline A/P: Prior otitis media appears to be healed despite only 4.5 days of cefdinir-sounds like it has been difficult to consistently take the Polytrim drops 3 times a day and with ongoing but improved discomfort, exam concerning for otitis externa with perforation-we opted to switch to ciprofloxacin otic for 7 more days - He had not heard from ENT and I gave him information to reach out  # Diabetes- new diagnosis based on labs 05/04/22 and 01/11/23 S: Medication:none  Exercise and diet- discussed cutting down on sweets- feels could cut donuts Lab Results  Component Value Date   HGBA1C 6.6 (H) 01/11/2023   HGBA1C 6.5 05/04/2022   HGBA1C 6.3 06/24/2021   A/P: discussed possible diabetes education- wants to hold off. Controlled without medications- check next visit  # Dysphagia S:reports sensation of food getting stuck at times or just congested in the throat and feels stopped up. OTC (available over the counter without a prescription) cough medicine. Can happen with eating or randomly. They are not sure of the name but reports no sedating medications.   A/P: Lungs are clear today.  We  discussed possible dysphagia workup and GI referral but he states it really is more of intermittent congestion and wants to monitor only for now  #walks with cane- we will get him permanent placard   #Aortic aneurysm-continues to have at least yearly checks most recently October 2023 with annual recheck planned- due now- reordered today   #prior abdominal bloating- has improved. Appetite still somewhat lower but no weight loss- x-rays reassuring last visit- continue to monitor   # Atrial fibrillation-follows with Dr. Allyson Sabal of cardiology S: Rate controlled with no Rx Anticoagulated with Coumadin-follows in our clinic A/P: Rate controlled without medication.  Anticoagulated appropriately with Coumadin-continue current medication   #hypertension S: medication: Amlodipine 2.5 mg--> 5 mg for help with Raynaud's (makes edema worse with venous insufficiency), triamterene hydrochlorothiazide 37.5-25 mg, Lasix 20 mg  as needed for edema- has needed last few days BP Readings from Last 3 Encounters:  03/24/23 110/62  03/16/23 135/81  01/11/23 122/70  A/P: Blood pressure well-controlled-continue current medication   #Insomnia S: Medication: Ambien 5 mg-aware of fall risk - tries to take just half -daughter reports doesn't help him sleep - also takes tylenol pm 8 o clock and then taking Ambien closer to 11 PM A/P: Insomnia with poor control-despite taking Tylenol PM as well as Ambien-encouraged him to completely stop the Tylenol PM and we discussed risks of Ambien and considering try to pull back - Discussed cutting behavioral therapy for insomnia and referral was placed  Recommended follow up: Return in about 2 months (around 05/25/2023) for followup or sooner if needed.Schedule b4 you leave. Future Appointments  Date Time Provider Department Center  04/07/2023  8:30 AM [facility]  05/25/2023  3:00 PM Shelva Majestic, MD [facility]    Lab/Order associations:    ICD-10-CM   1. Acute otitis externa of left ear, unspecified type  H60.502     2. Abdominal aortic aneurysm (AAA) without rupture, unspecified part (HCC)  I71.40 VAS [place] AAA DUPLEX    3. Controlled type 2 diabetes mellitus without complication, without long-term current use of insulin (HCC)  E11.9     4. Insomnia, unspecified type  G47.00 Ambulatory referral to Psychology    5. Atrial fibrillation, unspecified type (HCC)  I48.91     6. Essential hypertension  I10       Meds ordered this encounter  Medications   Ciprofloxacin HCl 0.2 % otic solution    Sig: Place 0.2 mLs into the left ear 2 (two) times daily.    Dispense:  14 each    Refill:  0   amLODipine (NORVASC) 5 MG tablet    Sig: Take 1 tablet (5 mg total) by mouth daily. For blood pressure and possible Raynauds    Dispense:  90 tablet    Refill:  3   allopurinol (ZYLOPRIM) 300 MG tablet    Sig: TAKE ONE-HALF TABLET BY MOUTH IN THE EVENING    Dispense:  45 tablet    Refill:  3    Time Spent: 45 minutes of total time (12:25 PM-1:10 PM) was spent on the date of the encounter performing the following actions: chart review prior to seeing the patient, obtaining history, performing a medically necessary exam, counseling on the treatment plan and how to connect with ENT referral, placing orders, and documenting in our EHR.    Return precautions advised.  Tana Conch, MD

## 2023-03-24 NOTE — Patient Instructions (Addendum)
Pre visit review using our clinic review tool, if applicable. No additional management support is needed unless otherwise documented below in the visit note.  Increase dose today to take 1 1/2 tablets today and then change weekly dose to take 1 tablet daily except take 1 1/2 tablets on Wednesday. Recheck in 2 weeks.

## 2023-03-24 NOTE — Patient Instructions (Addendum)
For ENT follow up  [facility] [place], [place] Main: [phone]  If you don't hear about ultrasound of aorta within a week give them a call  Handicap placard written for  New ear drops for 7 days on left ear and follow up with ENT given rupture in the membrane  We have placed a referral for you today to Blanchard Mane with [facility] for cognitive behavioral therapy for insomnia. In some cases you will see # listed below- you can call this if you have not heard within a week. If you do not see # listed- you should receive a mychart message or phone call within a week with the # to call directly- call that as soon as you get it. If you are having issues getting scheduled reach out to [place] again.   Stop tylenol pm  Recommended follow up: Return in about 2 months (around 05/25/2023) for followup or sooner if needed.Schedule b4 you leave.

## 2023-03-25 ENCOUNTER — Telehealth: Payer: Self-pay | Admitting: Family Medicine

## 2023-03-25 MED ORDER — CIPROFLOXACIN-DEXAMETHASONE 0.3-0.1 % OT SUSP: 4.0000 [drp] | Freq: Two times a day (BID) | OTIC | 0 refills | Status: AC

## 2023-03-25 NOTE — Telephone Encounter (Signed)
Caller called to let PCP know that they are not able to get Ciprofloxacin HCl 0.2 % otic solution  filled due to a shortage. States they were informed by multiple pharmacies that no one has it within a 60 miles radius. Please advise.

## 2023-03-25 NOTE — Telephone Encounter (Signed)
FYI, pt was seen yesterday.

## 2023-03-25 NOTE — Telephone Encounter (Signed)
I sent in an alternate medicine

## 2023-03-26 ENCOUNTER — Encounter (INDEPENDENT_AMBULATORY_CARE_PROVIDER_SITE_OTHER): Payer: Self-pay | Admitting: Otolaryngology

## 2023-03-30 ENCOUNTER — Telehealth (INDEPENDENT_AMBULATORY_CARE_PROVIDER_SITE_OTHER): Payer: Self-pay | Admitting: Otolaryngology

## 2023-03-30 NOTE — Telephone Encounter (Signed)
Spoke with daughter to confirm 12/11 appt, time and location

## 2023-03-31 ENCOUNTER — Ambulatory Visit (INDEPENDENT_AMBULATORY_CARE_PROVIDER_SITE_OTHER): Payer: Medicare Other | Admitting: Otolaryngology

## 2023-03-31 ENCOUNTER — Encounter (INDEPENDENT_AMBULATORY_CARE_PROVIDER_SITE_OTHER): Payer: Self-pay

## 2023-03-31 VITALS — Ht 70.0 in | Wt 168.0 lb

## 2023-03-31 DIAGNOSIS — H9192 Unspecified hearing loss, left ear: Secondary | ICD-10-CM

## 2023-03-31 DIAGNOSIS — H663X2 Other chronic suppurative otitis media, left ear: Secondary | ICD-10-CM | POA: Diagnosis not present

## 2023-03-31 DIAGNOSIS — H7292 Unspecified perforation of tympanic membrane, left ear: Secondary | ICD-10-CM | POA: Diagnosis not present

## 2023-03-31 MED ORDER — OFLOXACIN 0.3 % OP SOLN: 4.0000 [drp] | Freq: Two times a day (BID) | OPHTHALMIC | 1 refills | Status: AC

## 2023-03-31 NOTE — Progress Notes (Signed)
Dear Dr. Ruthine Dose, Here is my assessment for our mutual patient, William Mahoney. Thank you for allowing me the opportunity to care for your patient. Please do not hesitate to contact me should you have any other questions. Sincerely, Dr. Jovita Kussmaul  Otolaryngology Clinic Note Referring provider: Dr. Ruthine Dose HPI:  William Mahoney is a 89 y.o. male kindly referred by Dr. Ruthine Dose for evaluation of left otitis media and otitis externa.  Initial visit (03/2023): Patient reports: started to have left ear pain and throbbing about a month ago, went to Dr. Ruthine Dose and then Dr. Durene Cal. Dr. Ruthine Dose cleaned the ear, noted a TM perforation, and prescribed cortisporin drop and omnicef on 03/16/2023. The ear Mahoney irrigated with water. He took the abx but Mahoney having diarrhea so stopped, and then saw Dr. Durene Cal who prescribed ciprofloxacin (took them for only 2 days). He has been irrigating the ear with hydrogen peroxide. He reports he is having a dull ache in left ear but better than before and some fullness (some kind of blockage). He currently denies ear drainage. No hearing change, but daughter reports hearing decline Ears are typically not a problem. Q-tip use sometimes. No audiogram prior No throat problems since seeing Dr. Arley Phenix for throat discomfort; some dysphagia which is stable; no mental status changes or other laryngology sx including voice change, other neck masses, throat pain  Patient denies: vertigo, tinnitus Patient additionally denies: eustachian tube symptoms such as popping, crackling, sensitive to pressure changes Patient also denies barotrauma, vestibular suppressant use Prior ear surgery: no   H&N Surgery: denies Personal or FHx of bleeding dz or anesthesia difficulty: no  AP/AC: warfarin  Tobacco: quit 1980 (40 pack year history). Alcohol: no. 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 Lives in [place], [place]  PMHx: Diabetes, Dysphagia, A-fib, HTN, Insomnia, CKD, Aortic  Aneurysm  Independent Review of Additional Tests or Records:  ENT Dr. Doran Heater  (01/2022):  throat discomfort; began with URI sx mid-sept, rx amoxicillin with some improvement; left ear pain, and no dysphagia or other problems except for some voice change. Soft palate asymmetry and had CT/MRI; at time of eval, did improve; TFL Mahoney normal at that point; TFL showed normal palate at that point and no asymmetry noted. Rx: Clindamycin, celestone; then lost to f/u Ref notes Dr. Durene Cal and Dr. Ruthine Dose (02/2023 and 03/2023): notes reviewed and in chart; left ear pain worsened in Nov, ear clogged, using acetic acid/steroid drops; no drainage; no URI sx; Dx otitis externa, non-supprative otitis media and left TM rupture, Rx: cortisporin; ear irrigated; improved after Dr. Myrtie Cruise Rx, stopped abx Noted some dysphagia (food getting stuck), random; did not want further workup  CBC 03/2023: no leukoctosis; Plt 137 CMP: generally wnl  CT Neck 01/2022 and MRI 01/2022: reviewed independently, agree with left soft palate asymmetry with enhancement along left longus coli and tonsillar fossa; no parotid or parapharyngeal space nodules; no significant retoropharyngeal edema or abscess; no other significant LN noted; mastoids clear; no NP masses noted  PMH/Meds/All/SocHx/FamHx/ROS:   Past Medical History:  Diagnosis Date   BPH (benign prostatic hypertrophy)    Chronic atrial fibrillation [facility])    cardiologist--   dr berry   COPD (chronic obstructive pulmonary disease) (HCC)    Diverticulosis of colon    MODERATE LEFT SIDE   Epididymal cyst    w/ epididymalitis   Full dentures    Gout    Hand foot syndrome    secondary to chemotherapy (Xeloda)   cold hands/feet   Hearing  loss    History of alcohol abuse    quit drinking in the mid 80's   History of cirrhosis of liver    alcoholic--  hx alcohol abuse -- quit drinking 1980's   History of gout    History of melanoma excision    2013--  left ear lobe and back  of hand   History of pancreatitis    2008   History of rectal cancer oncologist-  dr Truett Perna--  no recurrence   dx Sept 2009--  Stage II (T3N0)  s/p  sigmoid colectomy & low anterior resection 04-18-2008  and chemoradiation 2010   History of shingles 07/27/2010   History of squamous cell carcinoma excision    left lower leg   Hyperlipidemia    Hypertension    Loose stools    DUE TO ANTIBIOTICS   Macular degeneration    not sure which eye   OA (osteoarthritis)    RBBB (right bundle branch block)    Renal lesion    chronic-- left side   Sciatica of right side    Urge urinary incontinence    intermittant     Past Surgical History:  Procedure Laterality Date   CARDIAC CATHETERIZATION  2001  approx.  in [place]   CATARACT EXTRACTION W/ INTRAOCULAR LENS  IMPLANT, BILATERAL     COLONOSCOPY  last one 06-27-2012   EXPLORATORY LAPAROTOMY/  LOW ANTERIOR RESECTION/  SIGMOID COLECTOMY  04-18-2008   DR INGRAM   INCISION AND DRAINAGE ABSCESS N/A 11/02/2014   Procedure: INCISION AND DRAINAGE OF SCROTUM;  Surgeon: Crist Fat, MD;  [facility];  Service: Urology;  Laterality: N/A;   INGUINAL HERNIA REPAIR Right 03/23/2014   Procedure: HERNIA REPAIR INGUINAL ADULT OPEN REPAIR RIGHT INGUINAL HERNIA REPAIR;  Surgeon: Claud Kelp, MD;  [facility];  Service: General;  Laterality: Right;   INSERTION OF MESH Right 03/23/2014   Procedure: INSERTION OF MESH;  Surgeon: Claud Kelp, MD;  Location: [facility] OR;  Service: General;  Laterality: Right;   LAPAROSCOPIC CHOLECYSTECTOMY  1990's   MASS EXCISION N/A 11/02/2014   Procedure: EXCISION OF EPIDIDYMAL CYST;  Surgeon: Crist Fat, MD;  Location: [facility];  Service: Urology;  Laterality: N/A;   TONSILLECTOMY  as child   TRANSTHORACIC ECHOCARDIOGRAM  05-11-2008   pseudonormal LV filling pattern,  ef 60-65%/  mild to moderate MV calcification no stenosis w/ mild to moderate regurg./  mild LAE and  RAE/  mild TR    Family History  Problem Relation Age of Onset   Hypertension Mother    Lymphoma Father    Lymphoma Other    Stroke Other        1st degree relative   Colon cancer Neg Hx    Esophageal cancer Neg Hx    Rectal cancer Neg Hx    Stomach cancer Neg Hx      Social Connections: Socially Isolated (12/26/2021)   Social Connection and Isolation Panel [NHANES]    Frequency of Communication with Friends and Family: Once a week    Frequency of Social Gatherings with Friends and Family: Once a week    Attends Religious Services: Never    Database administrator or Organizations: No    Attends Banker Meetings: Never    Marital Status: Widowed      Current Outpatient Medications:    allopurinol (ZYLOPRIM) 300 MG tablet, TAKE ONE-HALF TABLET BY MOUTH IN THE EVENING, Disp: 45 tablet,  Rfl: 3   amLODipine (NORVASC) 5 MG tablet, Take 1 tablet (5 mg total) by mouth daily. For blood pressure and possible Raynauds, Disp: 90 tablet, Rfl: 3   ammonium lactate (LAC-HYDRIN) 12 % lotion, PLEASE SEE ATTACHED FOR DETAILED DIRECTIONS, Disp: , Rfl:    fluocinonide (LIDEX) 0.05 % external solution, APPLY 1 ML TO AFFECTED AREAS OF SCALP AND CHEST NIGHTLY, Disp: 60 mL, Rfl: 3   fluticasone (FLONASE) 50 MCG/ACT nasal spray, Place 2 sprays into both nostrils daily., Disp: 48 g, Rfl: 3   furosemide (LASIX) 20 MG tablet, TAKE 1 TABLET BY MOUTH EVERY DAY AS NEEDED, Disp: 90 tablet, Rfl: 1   Multiple Vitamin (MULTIVITAMIN) tablet, Take 1 tablet by mouth daily., Disp: , Rfl:    ofloxacin (OCUFLOX) 0.3 % ophthalmic solution, Place 4 drops into the left ear in the morning and at bedtime for 14 days., Disp: 5 mL, Rfl: 1   tamsulosin (FLOMAX) 0.4 MG CAPS capsule, Take 1 capsule (0.4 mg total) by mouth daily. (Patient taking differently: Take 0.4 mg by mouth daily as needed (Prostate).), Disp: 90 capsule, Rfl: 3   triamterene-hydrochlorothiazide (MAXZIDE-25) 37.5-25 MG tablet, TAKE 1/2 TABLET BY  MOUTH DAILY (Patient taking differently: Take 0.5 tablets by mouth daily as needed. 1 tablet every day and on Wednesdays - takes 1.5 tablet.), Disp: 45 tablet, Rfl: 1   warfarin (COUMADIN) 2.5 MG tablet, TAKE 1 TABLET BY MOUTH DAILY OR AS DIRECTED BY ANTICOAGULATION CLINIC, Disp: 110 tablet, Rfl: 1   zolpidem (AMBIEN) 5 MG tablet, TAKE 1 TABLET BY MOUTH EVERY DAY AT BEDTIME AS NEEDED FOR SLEEP, Disp: 30 tablet, Rfl: 5   albuterol (VENTOLIN HFA) 108 (90 Base) MCG/ACT inhaler, Inhale 2 puffs into the lungs every 6 (six) hours as needed for wheezing or shortness of breath., Disp: 1 each, Rfl: 2   Physical Exam:   Ht 5\' 10"  (1.778 m)   Wt 168 lb (76.2 kg)   BMI 24.11 kg/m   Salient findings:  CN II-XII intact Given history and complaints, bilateral ear microscopy Mahoney indicated and performed for evaluation with findings as below in physical exam section and in procedures Right ear: EAC clear and TM intact with well pneumatized middle ear spaces Left ear EAC clear; left TM perforation - 20% anteroinferior, clean, mild debris inferiorly removed. No cholesteatoma; middle ear without drainage Weber 512: inconsistent mid, and left Rinne 512: AC > BC b/l  Anterior rhinoscopy: Septum relatively midline; no masses noted No lesions of oral cavity/oropharynx; palate elevation symmetric, do not note any asymmetry, erythema or tenderness or significant fullness today. Parotids without masses; palpable tongue base without abnormality No obviously palpable neck masses/lymphadenopathy/thyromegaly No respiratory distress or stridor  Seprately Identifiable Procedures:  deferred TFL given lack of findings today and resolution of prior sx Procedure: Bilateral ear microscopy using microscope (CPT 92504) Pre-procedure diagnosis: TM perforation, otitis media Post-procedure diagnosis: same Indication:seee above; given patient's otologic complaints and history, for improved and comprehensive examination of external  ear and tympanic membrane, bilateral otologic examination using microscope Mahoney performed  Procedure: Patient Mahoney placed semi-recumbent. Both ear canals were examined using the microscope with findings above. Some cerumen removed on left and on right using suction and currette Patient tolerated the procedure well.   Impression & Plans:  William Mahoney is a 89 y.o. male with:  1. Tympanic membrane perforation, left   2. Hearing loss of left ear, unspecified hearing loss type   3. Other chronic suppurative otitis media of left ear  4. Dysphagia 5. History of palate asymmetry  Left ear looks improved compared to prior exam; does have 20% perforation, slight wetness; suspect perhaps water related exacerbation? Prior CT with generally well aerated mastoids and ME; no evidence of cholesteatoma. We discussed management of TM perforation including safe, dry ear establishment and water precautions  1) Start Ofloxacin BID 14xd AS for any residual infection 2) Keep ear dry 3) Stop irrigating ear  For dysphagia; will hold off on workup, d/w pt Palate asymmetry: none noted today and asymptomatic; will reassess at next visit and potentially do TFL to comprehensively evaluate  F/u 1 month with audio  See below regarding exact medications prescribed this encounter including dosages and route: Meds ordered this encounter  Medications   ofloxacin (OCUFLOX) 0.3 % ophthalmic solution    Sig: Place 4 drops into the left ear in the morning and at bedtime for 14 days.    Dispense:  5 mL    Refill:  1      Thank you for allowing me the opportunity to care for your patient. Please do not hesitate to contact me should you have any other questions.  Sincerely, Jovita Kussmaul, MD Otolarynoglogist (ENT), [facility] Phone: [phone] Fax: [phone]  04/10/2023, 7:47 PM   MDM:  Level 4 Complexity/Problems addressed: mod multiple chronic problems - one with exacerbation Data  complexity: mod - independent review of CT imaging and MRI imaging; new of notes and albs - Morbidity: mod  - Prescription Drug prescribed or managed: yes

## 2023-04-01 ENCOUNTER — Emergency Department (HOSPITAL_COMMUNITY): Payer: Medicare Other

## 2023-04-01 ENCOUNTER — Encounter (HOSPITAL_COMMUNITY): Payer: Self-pay

## 2023-04-01 ENCOUNTER — Emergency Department (HOSPITAL_COMMUNITY)
Admission: EM | Admit: 2023-04-01 | Discharge: 2023-04-01 | Disposition: A | Discharge status: Home / Self Care | Payer: Medicare Other | Attending: Emergency Medicine | Admitting: Emergency Medicine

## 2023-04-01 DIAGNOSIS — I7143 Infrarenal abdominal aortic aneurysm, without rupture: Secondary | ICD-10-CM | POA: Diagnosis not present

## 2023-04-01 DIAGNOSIS — Z7901 Long term (current) use of anticoagulants: Secondary | ICD-10-CM | POA: Diagnosis not present

## 2023-04-01 DIAGNOSIS — N189 Chronic kidney disease, unspecified: Secondary | ICD-10-CM | POA: Diagnosis not present

## 2023-04-01 DIAGNOSIS — R109 Unspecified abdominal pain: Secondary | ICD-10-CM | POA: Diagnosis not present

## 2023-04-01 DIAGNOSIS — Z85048 Personal history of other malignant neoplasm of rectum, rectosigmoid junction, and anus: Secondary | ICD-10-CM | POA: Insufficient documentation

## 2023-04-01 DIAGNOSIS — M549 Dorsalgia, unspecified: Secondary | ICD-10-CM | POA: Diagnosis not present

## 2023-04-01 DIAGNOSIS — S0001XA Abrasion of scalp, initial encounter: Secondary | ICD-10-CM | POA: Insufficient documentation

## 2023-04-01 DIAGNOSIS — J449 Chronic obstructive pulmonary disease, unspecified: Secondary | ICD-10-CM | POA: Insufficient documentation

## 2023-04-01 DIAGNOSIS — S0181XA Laceration without foreign body of other part of head, initial encounter: Secondary | ICD-10-CM | POA: Diagnosis present

## 2023-04-01 DIAGNOSIS — I482 Chronic atrial fibrillation, unspecified: Secondary | ICD-10-CM | POA: Diagnosis not present

## 2023-04-01 DIAGNOSIS — Z8249 Family history of ischemic heart disease and other diseases of the circulatory system: Secondary | ICD-10-CM | POA: Insufficient documentation

## 2023-04-01 DIAGNOSIS — N200 Calculus of kidney: Secondary | ICD-10-CM | POA: Insufficient documentation

## 2023-04-01 DIAGNOSIS — W19XXXA Unspecified fall, initial encounter: Secondary | ICD-10-CM | POA: Insufficient documentation

## 2023-04-01 DIAGNOSIS — Z79899 Other long term (current) drug therapy: Secondary | ICD-10-CM | POA: Diagnosis not present

## 2023-04-01 DIAGNOSIS — I129 Hypertensive chronic kidney disease with stage 1 through stage 4 chronic kidney disease, or unspecified chronic kidney disease: Secondary | ICD-10-CM | POA: Diagnosis not present

## 2023-04-01 DIAGNOSIS — I700 Atherosclerosis of aorta: Secondary | ICD-10-CM | POA: Insufficient documentation

## 2023-04-01 DIAGNOSIS — Z87891 Personal history of nicotine dependence: Secondary | ICD-10-CM | POA: Diagnosis not present

## 2023-04-01 DIAGNOSIS — E785 Hyperlipidemia, unspecified: Secondary | ICD-10-CM | POA: Insufficient documentation

## 2023-04-01 DIAGNOSIS — S0990XA Unspecified injury of head, initial encounter: Secondary | ICD-10-CM

## 2023-04-01 LAB — PROTIME-INR
INR: 1.6 — ABNORMAL HIGH (ref 0.8–1.2)
Prothrombin Time: 19 s — ABNORMAL HIGH (ref 11.4–15.2)

## 2023-04-01 LAB — CBC WITH DIFFERENTIAL/PLATELET
Abs Immature Granulocytes: 0.04 10*3/uL (ref 0.00–0.07)
Basophils Absolute: 0 10*3/uL (ref 0.0–0.1)
Basophils Relative: 1 %
Eosinophils Absolute: 0.1 10*3/uL (ref 0.0–0.5)
Eosinophils Relative: 2 %
HCT: 44.6 % (ref 39.0–52.0)
Hemoglobin: 14.5 g/dL (ref 13.0–17.0)
Immature Granulocytes: 1 %
Lymphocytes Relative: 21 %
Lymphs Abs: 1.4 10*3/uL (ref 0.7–4.0)
MCH: 29.9 pg (ref 26.0–34.0)
MCHC: 32.5 g/dL (ref 30.0–36.0)
MCV: 92 fL (ref 80.0–100.0)
Monocytes Absolute: 0.8 10*3/uL (ref 0.1–1.0)
Monocytes Relative: 11 %
Neutro Abs: 4.4 10*3/uL (ref 1.7–7.7)
Neutrophils Relative %: 64 %
Platelets: 137 10*3/uL — ABNORMAL LOW (ref 150–400)
RBC: 4.85 MIL/uL (ref 4.22–5.81)
RDW: 14.5 % (ref 11.5–15.5)
WBC: 6.8 10*3/uL (ref 4.0–10.5)
nRBC: 0 % (ref 0.0–0.2)

## 2023-04-01 LAB — COMPREHENSIVE METABOLIC PANEL
ALT: 14 U/L (ref 0–44)
AST: 25 U/L (ref 15–41)
Albumin: 3.6 g/dL (ref 3.5–5.0)
Alkaline Phosphatase: 60 U/L (ref 38–126)
Anion gap: 11 (ref 5–15)
BUN: 22 mg/dL (ref 8–23)
CO2: 26 mmol/L (ref 22–32)
Calcium: 9.4 mg/dL (ref 8.9–10.3)
Chloride: 102 mmol/L (ref 98–111)
Creatinine, Ser: 1.19 mg/dL (ref 0.61–1.24)
GFR, Estimated: 56 mL/min — ABNORMAL LOW (ref >60)
Glucose, Bld: 134 mg/dL — ABNORMAL HIGH (ref 70–99)
Potassium: 3.9 mmol/L (ref 3.5–5.1)
Sodium: 139 mmol/L (ref 135–145)
Total Bilirubin: 1.1 mg/dL (ref <1.2)
Total Protein: 6.3 g/dL — ABNORMAL LOW (ref 6.5–8.1)

## 2023-04-01 MED ORDER — ACETAMINOPHEN 500 MG PO TABS
1000.0000 mg | ORAL_TABLET | Freq: Once | ORAL | Status: AC
  Administered 2023-04-01: 1000 mg via ORAL
  Filled 2023-04-01: qty 2

## 2023-04-01 MED ORDER — IOHEXOL 350 MG/ML SOLN
75.0000 mL | Freq: Once | INTRAVENOUS | Status: AC | PRN
  Administered 2023-04-01: 75 mL via INTRAVENOUS

## 2023-04-01 NOTE — ED Notes (Signed)
Trauma Response Nurse Documentation   William Mahoney is a 89 y.o. male arriving to [facility] ED via EMS  On warfarin daily. Trauma was activated as a Level 2 by ED Charge RN based on the following trauma criteria Elderly patients > 65 with head trauma on anti-coagulation (excluding ASA).  Patient cleared for CT by Dr. Suezanne Jacquet. Pt transported to CT with primary nurse to monitor him throughout his absence from the Emergency Department.   GCS 15.  Trauma MD Arrival Time: N/A.   History   Past Medical History:  Diagnosis Date   BPH (benign prostatic hypertrophy)    Chronic atrial fibrillation [facility])    cardiologist--   dr berry   COPD (chronic obstructive pulmonary disease) (HCC)    Diverticulosis of colon    MODERATE LEFT SIDE   Epididymal cyst    w/ epididymalitis   Full dentures    Gout    Hand foot syndrome    secondary to chemotherapy (Xeloda)   cold hands/feet   Hearing loss    History of alcohol abuse    quit drinking in the mid 80's   History of cirrhosis of liver    alcoholic--  hx alcohol abuse -- quit drinking 1980's   History of gout    History of melanoma excision    2013--  left ear lobe and back of hand   History of pancreatitis    2008   History of rectal cancer oncologist-  dr Truett Perna--  no recurrence   dx Sept 2009--  Stage II (T3N0)  s/p  sigmoid colectomy & low anterior resection 04-18-2008  and chemoradiation 2010   History of shingles 07/27/2010   History of squamous cell carcinoma excision    left lower leg   Hyperlipidemia    Hypertension    Loose stools    DUE TO ANTIBIOTICS   Macular degeneration    not sure which eye   OA (osteoarthritis)    RBBB (right bundle branch block)    Renal lesion    chronic-- left side   Sciatica of right side    Urge urinary incontinence    intermittant     Past Surgical History:  Procedure Laterality Date   CARDIAC CATHETERIZATION  2001  approx.  in [place]   CATARACT EXTRACTION W/ INTRAOCULAR LENS   IMPLANT, BILATERAL     COLONOSCOPY  last one 06-27-2012   EXPLORATORY LAPAROTOMY/  LOW ANTERIOR RESECTION/  SIGMOID COLECTOMY  04-18-2008   DR INGRAM   INCISION AND DRAINAGE ABSCESS N/A 11/02/2014   Procedure: INCISION AND DRAINAGE OF SCROTUM;  Surgeon: Crist Fat, MD;  Location: [facility];  Service: Urology;  Laterality: N/A;   INGUINAL HERNIA REPAIR Right 03/23/2014   Procedure: HERNIA REPAIR INGUINAL ADULT OPEN REPAIR RIGHT INGUINAL HERNIA REPAIR;  Surgeon: Claud Kelp, MD;  [facility] OR;  Service: General;  Laterality: Right;   INSERTION OF MESH Right 03/23/2014   Procedure: INSERTION OF MESH;  Surgeon: Claud Kelp, MD;  Location: [facility] OR;  Service: General;  Laterality: Right;   LAPAROSCOPIC CHOLECYSTECTOMY  1990's   MASS EXCISION N/A 11/02/2014   Procedure: EXCISION OF EPIDIDYMAL CYST;  Surgeon: Crist Fat, MD;  Location: [facility];  Service: Urology;  Laterality: N/A;   TONSILLECTOMY  as child   TRANSTHORACIC ECHOCARDIOGRAM  05-11-2008   pseudonormal LV filling pattern,  ef 60-65%/  mild to moderate MV calcification no stenosis w/ mild to moderate regurg./  mild LAE and  RAE/  mild TR     Initial Focused Assessment (If applicable, or please see trauma documentation): - Airway: intact, patent - Breathing: No SOB, Breath sounds clear, equal, bilaterally  - Circulation: Approx 4 cm lac to L side of head with bleeding controlled.  Pulses intact in all peripherals.  L leg slightly swollen (pt states this is baseline and takes "water" pills) Disability: PERRLA 2's.  A/Ox4   CT's Completed:   CT Head and CT C-Spine  CT chest abdomen pelvis w/ contrast   Interventions:  Labs drawn CT head and c-spine  Assessed pt all over and pt c/o R flank pain and mild HA on that left side.    Plan for disposition:  Other Awaiting scans   Consults completed:  none at 1530.  Event Summary: Pt was BIB [facility] after having an unwitnessed fall  tripping over his own feet.  Pt had no LOC.  No neck pain present.  Pt states that he sometimes uses a cane or walker at baseline. C/O R flank pain but no obvious signs of trauma externally.   Bedside handoff with ED RN Paden.    Janora Norlander  Trauma Response RN  Please call TRN at [phone] for further assistance.

## 2023-04-01 NOTE — ED Provider Notes (Signed)
  Physical Exam  BP (!) 157/59   Pulse 75   Temp 98.4 F (36.9 C) (Oral)   Resp 19   Ht 5\' 10"  (1.778 m)   Wt 76.2 kg   SpO2 98%   BMI 24.11 kg/m     Procedures  Procedures  ED Course / MDM    Medical Decision Making Amount and/or Complexity of Data Reviewed Labs: ordered. Radiology: ordered.  Risk OTC drugs. Prescription drug management.   28M hx of afib on warfarin, unwitnessed fall, CT head and Cervical spine normal, pt pending CT C/A/P. Abrasion to forehead hemostatic. Plan for CT and likely DC if normal.   CT C/A/P:  IMPRESSION:  1. No evidence of acute traumatic injury in the chest, abdomen, or  pelvis.  2. 20 mm solid, lobular nodule in the left lower lobe. 12 mm  subsolid nodule in the right lower lobe. Follow-up as clinically  indicated for patient age and comorbidities.  3. Unchanged size of a 4.5 cm infrarenal aortic aneurysm with  increased mural thrombus. Follow-up as clinically indicated for  patient age and comorbidities.  4. Unchanged cirrhotic appearance of the liver.  5. Nonobstructing right renal calculus.    Aortic Atherosclerosis (ICD10-I70.0).    Pt reassessed, he is overall well-appearing and ready for discharge.  Negative trauma workup at this time, stable for discharge and outpatient follow-up.    Ernie Avena, MD 04/01/23 [phone]

## 2023-04-01 NOTE — ED Notes (Signed)
Patient ambulated to the restroom with family with minimal assistance. Patient usually uses a cane but was comfortable holding to family only. Patient returned to room without difficulty.

## 2023-04-01 NOTE — ED Triage Notes (Signed)
Patient BIB [facility] from after unwitnessed fall but immediately found by family. Patient has 3-4in laceration from left forehead to above the ear. Patient is A&Ox4, GCS 15, no LOC, and ambulatory throughout care.

## 2023-04-01 NOTE — ED Provider Notes (Signed)
[place] EMERGENCY DEPARTMENT AT [facility] Provider Note  CSN: [id number] Arrival date & time: 04/01/23 1233  Chief Complaint(s) Fall  HPI William Mahoney is a 89 y.o. male history of COPD, diverticulitis.  Atrial fibrillation on Coumadin, hypertension, hyperlipidemia, CKD, presenting to the emergency department after a fall.  He reports he stood up suddenly, his feet got tangled and he fell over landing on his left forehead.  He also reports some right sided back pain.  No numbness or tingling, weakness.  No shortness of breath.  No abdominal pain.  No nausea or vomiting.  No headaches.  No fevers or chills.  Does some does report some right flank/CVA area pain.   Past Medical History Past Medical History:  Diagnosis Date   BPH (benign prostatic hypertrophy)    Chronic atrial fibrillation [facility])    cardiologist--   dr berry   COPD (chronic obstructive pulmonary disease) (HCC)    Diverticulosis of colon    MODERATE LEFT SIDE   Epididymal cyst    w/ epididymalitis   Full dentures    Gout    Hand foot syndrome    secondary to chemotherapy (Xeloda)   cold hands/feet   Hearing loss    History of alcohol abuse    quit drinking in the mid 80's   History of cirrhosis of liver    alcoholic--  hx alcohol abuse -- quit drinking 1980's   History of gout    History of melanoma excision    2013--  left ear lobe and back of hand   History of pancreatitis    2008   History of rectal cancer oncologist-  dr Truett Perna--  no recurrence   dx Sept 2009--  Stage II (T3N0)  s/p  sigmoid colectomy & low anterior resection 04-18-2008  and chemoradiation 2010   History of shingles 07/27/2010   History of squamous cell carcinoma excision    left lower leg   Hyperlipidemia    Hypertension    Loose stools    DUE TO ANTIBIOTICS   Macular degeneration    not sure which eye   OA (osteoarthritis)    RBBB (right bundle branch block)    Renal lesion    chronic-- left side   Sciatica of  right side    Urge urinary incontinence    intermittant   Patient Active Problem List   Diagnosis Date Noted   Diabetes mellitus type 2, controlled (HCC) 03/24/2023   Porokeratosis 11/25/2021   Hyperglycemia 06/25/2021   AAA (abdominal aortic aneurysm) without rupture 11/20/2019   PAD (peripheral artery disease) (HCC) 11/20/2019   Chronic kidney disease (CKD), stage III (moderate) (HCC) 02/15/2019   Aortic atherosclerosis (HCC) 01/05/2019   Edema 11/10/2016   Insomnia 10/19/2014   Encounter for therapeutic drug monitoring 08/27/2014   Gout 01/09/2014   Allergic rhinitis 08/15/2013   Arthritis of lumbar spine 01/20/2013   Inguinal hernia 03/21/2010   BASAL CELL CARCINOMA SKIN LOWER LIMB INCL HIP 03/21/2010   Atrial fibrillation (HCC) 12/07/2008   Actinic keratosis 12/07/2008   History of cancer of rectosigmoid junction 01/04/2008   Former smoker 06/23/2007   Hyperlipidemia, unspecified 02/09/2007   TMJ (temporomandibular joint disorder) 02/09/2007   Essential hypertension 12/07/2006   COPD (chronic obstructive pulmonary disease) (HCC) 12/07/2006   BPH (benign prostatic hyperplasia) 12/07/2006   URINARY INCONTINENCE 12/07/2006   History of digestive system disease 12/07/2006   Home Medication(s) Prior to Admission medications   Medication Sig Start Date End Date Taking?  Authorizing Provider  albuterol (VENTOLIN HFA) 108 (90 Base) MCG/ACT inhaler Inhale 2 puffs into the lungs every 6 (six) hours as needed for wheezing or shortness of breath. 09/18/22  Yes Shelva Majestic, MD  allopurinol (ZYLOPRIM) 300 MG tablet TAKE ONE-HALF TABLET BY MOUTH IN THE EVENING 03/24/23  Yes Shelva Majestic, MD  amLODipine (NORVASC) 5 MG tablet Take 1 tablet (5 mg total) by mouth daily. For blood pressure and possible Raynauds 03/24/23  Yes Shelva Majestic, MD  ammonium lactate (LAC-HYDRIN) 12 % lotion PLEASE SEE ATTACHED FOR DETAILED DIRECTIONS 09/17/22  Yes [provider]  fluocinonide  (LIDEX) 0.05 % external solution APPLY 1 ML TO AFFECTED AREAS OF SCALP AND CHEST NIGHTLY 11/16/22  Yes Shelva Majestic, MD  fluticasone (FLONASE) 50 MCG/ACT nasal spray Place 2 sprays into both nostrils daily. 07/20/22  Yes Shelva Majestic, MD  furosemide (LASIX) 20 MG tablet TAKE 1 TABLET BY MOUTH EVERY DAY AS NEEDED 01/15/23  Yes Nafziger, Kandee Keen, NP  Multiple Vitamin (MULTIVITAMIN) tablet Take 1 tablet by mouth daily.   Yes [provider]  ofloxacin (OCUFLOX) 0.3 % ophthalmic solution Place 4 drops into the left ear in the morning and at bedtime for 14 days. 03/31/23 04/14/23 Yes Jovita Kussmaul B, MD  tamsulosin (FLOMAX) 0.4 MG CAPS capsule Take 1 capsule (0.4 mg total) by mouth daily. Patient taking differently: Take 0.4 mg by mouth daily as needed (Prostate). 09/18/22  Yes Shelva Majestic, MD  triamterene-hydrochlorothiazide (MAXZIDE-25) 37.5-25 MG tablet TAKE 1/2 TABLET BY MOUTH DAILY Patient taking differently: Take 0.5 tablets by mouth daily as needed. 1 tablet every day and on Wednesdays - takes 1.5 tablet. 02/09/23  Yes Shelva Majestic, MD  warfarin (COUMADIN) 2.5 MG tablet TAKE 1 TABLET BY MOUTH DAILY OR AS DIRECTED BY ANTICOAGULATION CLINIC 02/02/23  Yes Shelva Majestic, MD  zolpidem (AMBIEN) 5 MG tablet TAKE 1 TABLET BY MOUTH EVERY DAY AT BEDTIME AS NEEDED FOR SLEEP 03/22/23  Yes Shelva Majestic, MD  ciprofloxacin-dexamethasone (CIPRODEX) OTIC suspension Place 4 drops into the left ear 2 (two) times daily for 7 days. Patient not taking: Reported on 04/01/2023 03/25/23 04/01/23  Shelva Majestic, MD                                                                                                                                    Past Surgical History Past Surgical History:  Procedure Laterality Date   CARDIAC CATHETERIZATION  2001  approx.  in [place]   CATARACT EXTRACTION W/ INTRAOCULAR LENS  IMPLANT, BILATERAL     COLONOSCOPY  last one 06-27-2012   EXPLORATORY  LAPAROTOMY/  LOW ANTERIOR RESECTION/  SIGMOID COLECTOMY  04-18-2008   DR INGRAM   INCISION AND DRAINAGE ABSCESS N/A 11/02/2014   Procedure: INCISION AND DRAINAGE OF SCROTUM;  Surgeon: Crist Fat, MD;  Location: [facility];  Service: Urology;  Laterality:  N/A;   INGUINAL HERNIA REPAIR Right 03/23/2014   Procedure: HERNIA REPAIR INGUINAL ADULT OPEN REPAIR RIGHT INGUINAL HERNIA REPAIR;  Surgeon: Claud Kelp, MD;  [facility] OR;  Service: General;  Laterality: Right;   INSERTION OF MESH Right 03/23/2014   Procedure: INSERTION OF MESH;  Surgeon: Claud Kelp, MD;  Location: [facility] OR;  Service: General;  Laterality: Right;   LAPAROSCOPIC CHOLECYSTECTOMY  1990's   MASS EXCISION N/A 11/02/2014   Procedure: EXCISION OF EPIDIDYMAL CYST;  Surgeon: Crist Fat, MD;  Location: [facility];  Service: Urology;  Laterality: N/A;   TONSILLECTOMY  as child   TRANSTHORACIC ECHOCARDIOGRAM  05-11-2008   pseudonormal LV filling pattern,  ef 60-65%/  mild to moderate MV calcification no stenosis w/ mild to moderate regurg./  mild LAE and RAE/  mild TR   Family History Family History  Problem Relation Age of Onset   Hypertension Mother    Lymphoma Father    Lymphoma Other    Stroke Other        1st degree relative   Colon cancer Neg Hx    Esophageal cancer Neg Hx    Rectal cancer Neg Hx    Stomach cancer Neg Hx     Social History Social History   Tobacco Use   Smoking status: Former    Current packs/day: 0.00    Average packs/day: 1 pack/day for 55.0 years (55.0 ttl pk-yrs)    Types: Cigarettes    Start date: 03/20/1953    Quit date: 03/20/2008    Years since quitting: 15.0   Smokeless tobacco: Never  Vaping Use   Vaping status: Never Used  Substance Use Topics   Alcohol use: No    Comment: hx alcohol abuse -- quit in 1980's    Drug use: No   Allergies Patient has no known allergies.  Review of Systems Review of Systems  All other systems  reviewed and are negative.   Physical Exam Vital Signs  I have reviewed the triage vital signs BP (!) 157/59   Pulse 75   Temp 98.4 F (36.9 C) (Oral)   Resp 19   Ht 5\' 10"  (1.778 m)   Wt 76.2 kg   SpO2 98%   BMI 24.11 kg/m  Physical Exam Vitals and nursing note reviewed.  Constitutional:      General: He is not in acute distress.    Appearance: Normal appearance.  HENT:     Mouth/Throat:     Mouth: Mucous membranes are moist.  Eyes:     Conjunctiva/sclera: Conjunctivae normal.  Cardiovascular:     Rate and Rhythm: Normal rate and regular rhythm.  Pulmonary:     Effort: Pulmonary effort is normal. No respiratory distress.     Breath sounds: Normal breath sounds.  Abdominal:     General: Abdomen is flat.     Palpations: Abdomen is soft.     Tenderness: There is no abdominal tenderness. There is right CVA tenderness.  Musculoskeletal:     Right lower leg: No edema.     Left lower leg: No edema.     Comments: No midline C, T, L-spine tenderness.  No chest wall tenderness or crepitus.  Full painless range of motion at the bilateral upper extremities including the shoulders, elbows, wrists, hand and fingers, and in the bilateral lower extremities including the hips, knees, ankle, toes.  No focal bony tenderness, injury or deformity.  Skin:    General: Skin is warm and dry.  Capillary Refill: Capillary refill takes less than 2 seconds.     Comments: Superficial abrasion to the left anterior scalp  Neurological:     Mental Status: He is alert and oriented to person, place, and time. Mental status is at baseline.  Psychiatric:        Mood and Affect: Mood normal.        Behavior: Behavior normal.     ED Results and Treatments Labs (all labs ordered are listed, but only abnormal results are displayed) Labs Reviewed  COMPREHENSIVE METABOLIC PANEL - Abnormal; Notable for the following components:      Result Value   Glucose, Bld 134 (*)    Total Protein 6.3 (*)     GFR, Estimated 56 (*)    All other components within normal limits  CBC WITH DIFFERENTIAL/PLATELET - Abnormal; Notable for the following components:   Platelets 137 (*)    All other components within normal limits  PROTIME-INR - Abnormal; Notable for the following components:   Prothrombin Time 19.0 (*)    INR 1.6 (*)    All other components within normal limits                                                                                                                          Radiology CT Head Wo Contrast Result Date: 04/01/2023 CLINICAL DATA:  Head trauma, minor (Age >= 65y); Neck trauma (Age >= 65y) EXAM: CT HEAD WITHOUT CONTRAST CT CERVICAL SPINE WITHOUT CONTRAST TECHNIQUE: Multidetector CT imaging of the head and cervical spine was performed following the standard protocol without intravenous contrast. Multiplanar CT image reconstructions of the cervical spine were also generated. RADIATION DOSE REDUCTION: This exam was performed according to the departmental dose-optimization program which includes automated exposure control, adjustment of the mA and/or kV according to patient size and/or use of iterative reconstruction technique. COMPARISON:  None Available. FINDINGS: CT HEAD FINDINGS Brain: No evidence of acute infarction, hemorrhage, hydrocephalus, extra-axial collection or mass lesion/mass effect. Carotid cerebral atrophy. Mild patchy white matter hypodensities, compatible with chronic microvascular ischemic change. Vascular: Calcific atherosclerosis. Skull: No acute fracture. Sinuses/Orbits: Clear sinuses.  No acute orbital findings. CT CERVICAL SPINE FINDINGS Alignment: Normal. Skull base and vertebrae: No acute fracture. Vertebral body heights are maintained. Soft tissues and spinal canal: No prevertebral fluid or swelling. No visible canal hematoma. Disc levels:  Mild for age multilevel bony degenerative change. Upper chest: Visualized lung apices are clear. IMPRESSION: No evidence of  acute abnormality intracranially or in the cervical spine. Electronically Signed   By: Feliberto Harts M.D.   On: 04/01/2023 14:36   CT Cervical Spine Wo Contrast Result Date: 04/01/2023 CLINICAL DATA:  Head trauma, minor (Age >= 65y); Neck trauma (Age >= 65y) EXAM: CT HEAD WITHOUT CONTRAST CT CERVICAL SPINE WITHOUT CONTRAST TECHNIQUE: Multidetector CT imaging of the head and cervical spine was performed following the standard protocol without intravenous contrast. Multiplanar CT image reconstructions of the cervical spine were also  generated. RADIATION DOSE REDUCTION: This exam was performed according to the departmental dose-optimization program which includes automated exposure control, adjustment of the mA and/or kV according to patient size and/or use of iterative reconstruction technique. COMPARISON:  None Available. FINDINGS: CT HEAD FINDINGS Brain: No evidence of acute infarction, hemorrhage, hydrocephalus, extra-axial collection or mass lesion/mass effect. Carotid cerebral atrophy. Mild patchy white matter hypodensities, compatible with chronic microvascular ischemic change. Vascular: Calcific atherosclerosis. Skull: No acute fracture. Sinuses/Orbits: Clear sinuses.  No acute orbital findings. CT CERVICAL SPINE FINDINGS Alignment: Normal. Skull base and vertebrae: No acute fracture. Vertebral body heights are maintained. Soft tissues and spinal canal: No prevertebral fluid or swelling. No visible canal hematoma. Disc levels:  Mild for age multilevel bony degenerative change. Upper chest: Visualized lung apices are clear. IMPRESSION: No evidence of acute abnormality intracranially or in the cervical spine. Electronically Signed   By: Feliberto Harts M.D.   On: 04/01/2023 14:36    Pertinent labs & imaging results that were available during my care of the patient were reviewed by me and considered in my medical decision making (see MDM for details).  Medications Ordered in ED Medications   acetaminophen (TYLENOL) tablet 1,000 mg (1,000 mg Oral Given 04/01/23 1512)                                                                                                                                     Procedures Procedures  (including critical care time)  Medical Decision Making / ED Course   MDM:  89 year old male presenting to the emergency department with fall.    Patient reports mechanical fall with his legs getting tangled.  No syncopal event or lightheadedness or dizziness preceding his fall.  He did hit his head.  CT scan was obtained with no evidence of intracranial bleeding.  He has a superficial abrasion to his left anterior forehead.  CT scan of the neck was also performed with no evidence of any injuries. On reassessment he did have tenderness in the right flank area.  Given his age and tenderness in this region, will obtain CT scans to further evaluate.  Signed out to oncoming provider Dr. Karene Fry pending CT scans.      Additional history obtained: -Additional history obtained from ems   Lab Tests: -I ordered, reviewed, and interpreted labs.   The pertinent results include:   Labs Reviewed  COMPREHENSIVE METABOLIC PANEL - Abnormal; Notable for the following components:      Result Value   Glucose, Bld 134 (*)    Total Protein 6.3 (*)    GFR, Estimated 56 (*)    All other components within normal limits  CBC WITH DIFFERENTIAL/PLATELET - Abnormal; Notable for the following components:   Platelets 137 (*)    All other components within normal limits  PROTIME-INR - Abnormal; Notable for the following components:   Prothrombin Time 19.0 (*)    INR 1.6 (*)  All other components within normal limits    Notable for mild subtherapeutic INR, thrombocytopenia      Imaging Studies ordered: I ordered imaging studies including CT head and neck On my interpretation imaging demonstrates no acute process I independently visualized and interpreted imaging. I  agree with the radiologist interpretation   Medicines ordered and prescription drug management: Meds ordered this encounter  Medications   acetaminophen (TYLENOL) tablet 1,000 mg    -I have reviewed the patients home medicines and have made adjustments as needed   Reevaluation: After the interventions noted above, I reevaluated the patient and found that their symptoms have improved  Co morbidities that complicate the patient evaluation  Past Medical History:  Diagnosis Date   BPH (benign prostatic hypertrophy)    Chronic atrial fibrillation [facility])    cardiologist--   dr berry   COPD (chronic obstructive pulmonary disease) (HCC)    Diverticulosis of colon    MODERATE LEFT SIDE   Epididymal cyst    w/ epididymalitis   Full dentures    Gout    Hand foot syndrome    secondary to chemotherapy (Xeloda)   cold hands/feet   Hearing loss    History of alcohol abuse    quit drinking in the mid 80's   History of cirrhosis of liver    alcoholic--  hx alcohol abuse -- quit drinking 1980's   History of gout    History of melanoma excision    2013--  left ear lobe and back of hand   History of pancreatitis    2008   History of rectal cancer oncologist-  dr Truett Perna--  no recurrence   dx Sept 2009--  Stage II (T3N0)  s/p  sigmoid colectomy & low anterior resection 04-18-2008  and chemoradiation 2010   History of shingles 07/27/2010   History of squamous cell carcinoma excision    left lower leg   Hyperlipidemia    Hypertension    Loose stools    DUE TO ANTIBIOTICS   Macular degeneration    not sure which eye   OA (osteoarthritis)    RBBB (right bundle branch block)    Renal lesion    chronic-- left side   Sciatica of right side    Urge urinary incontinence    intermittant      Dispostion: Disposition decision including need for hospitalization was considered, and patient disposition pending at time of sign out.    Final Clinical Impression(s) / ED Diagnoses Final  diagnoses:  Injury of head, initial encounter  Right flank pain     This chart was dictated using voice recognition software.  Despite best efforts to proofread,  errors can occur which can change the documentation meaning.    Lonell Grandchild, MD 04/01/23 1538

## 2023-04-01 NOTE — Progress Notes (Signed)
Orthopedic Tech Progress Note Patient Details:  William Mahoney November 08, 1925 [id number]  Responded to level 2 trauma, not needed at this time  Patient ID: William Mahoney, male   DOB: 06-18-1925, 89 y.o.   MRN: [id number]  William Mahoney 04/01/2023, 12:59 PM

## 2023-04-01 NOTE — Discharge Instructions (Signed)
Your CT imaging was negative for evidence of acute traumatic injury, follow-up as needed with your primary care physician.

## 2023-04-02 ENCOUNTER — Telehealth: Payer: Self-pay | Admitting: Cardiovascular Disease

## 2023-04-02 NOTE — Telephone Encounter (Signed)
Called and spoke to patients daughter about upcoming procedure. Questions were answered she stated that if anything changes she would call back and let us know.

## 2023-04-02 NOTE — Telephone Encounter (Signed)
Daughter would like to know if 12/20 AAA will include contrast. She states patient had a fall yesterday, hitting his head and back. He had multiple scans with contrast. She mentions that patient's kidneys aren't working great and she was advised to contact heart care to see if additional contrast is required.

## 2023-04-07 ENCOUNTER — Ambulatory Visit: Payer: Medicare Other

## 2023-04-07 DIAGNOSIS — Z7901 Long term (current) use of anticoagulants: Secondary | ICD-10-CM

## 2023-04-07 LAB — POCT INR: INR: 1.9 — AB (ref 2.0–3.0)

## 2023-04-07 NOTE — Patient Instructions (Addendum)
Pre visit review using our clinic review tool, if applicable. No additional management support is needed unless otherwise documented below in the visit note.  Increase dose today to take 1 1/2 tablets today and then continue 1 tablet daily except take 1 1/2 tablets on Wednesday. Recheck in 3 weeks.

## 2023-04-07 NOTE — Progress Notes (Signed)
I have reviewed and agree with note, evaluation, plan.   Jeffory Snelgrove, MD  

## 2023-04-07 NOTE — Progress Notes (Signed)
Pt in ER on 04/01/23 for fall and head injury. Pt reports he is doing well since the fall, just some residual back pain, which he reports is tolerable. Pt also reported he has been forgetting to take 1 1/2 tablets on Wednesdays and has only been taking 1 tablet everyday. His dosing was changed at last apt due to repetitive subtherapeutic INRs. Advised pt the importance of following dosing instructions. Pt verbalized understanding.  Due to pt not changing dose after last apt, no change in weekly dose will be made. Pt will be more vigilant to follow dosing instructions closely. Increase dose today to take 1 1/2 tablets today and then continue 1 tablet daily except take 1 1/2 tablets on Wednesday. Recheck in 3 weeks.

## 2023-04-09 ENCOUNTER — Ambulatory Visit (HOSPITAL_COMMUNITY): Payer: Medicare Other

## 2023-04-28 ENCOUNTER — Ambulatory Visit: Payer: Medicare Other

## 2023-04-28 ENCOUNTER — Institutional Professional Consult (permissible substitution) (INDEPENDENT_AMBULATORY_CARE_PROVIDER_SITE_OTHER): Payer: Medicare Other

## 2023-04-28 DIAGNOSIS — Z7901 Long term (current) use of anticoagulants: Secondary | ICD-10-CM

## 2023-04-28 LAB — POCT INR: INR: 2.1 (ref 2.0–3.0)

## 2023-04-28 NOTE — Progress Notes (Signed)
 I have reviewed and agree with note, evaluation, plan.   Tana Conch, MD

## 2023-04-28 NOTE — Progress Notes (Signed)
 Continue 1 tablet daily except take 1 1/2 tablets on Wednesday. Recheck in 6 weeks.

## 2023-04-28 NOTE — Patient Instructions (Addendum)
 Pre visit review using our clinic review tool, if applicable. No additional management support is needed unless otherwise documented below in the visit note.  Continue 1 tablet daily except take 1 1/2 tablets on Wednesday.  Recheck in 6 weeks.

## 2023-04-30 ENCOUNTER — Telehealth (INDEPENDENT_AMBULATORY_CARE_PROVIDER_SITE_OTHER): Payer: Self-pay | Admitting: Otolaryngology

## 2023-04-30 NOTE — Telephone Encounter (Signed)
 Called and spoke with daughter to confirm address for appointment on 05/03/2023.

## 2023-05-03 ENCOUNTER — Ambulatory Visit (INDEPENDENT_AMBULATORY_CARE_PROVIDER_SITE_OTHER): Payer: Medicare Other | Admitting: Otolaryngology

## 2023-05-03 ENCOUNTER — Ambulatory Visit (INDEPENDENT_AMBULATORY_CARE_PROVIDER_SITE_OTHER): Payer: Medicare Other | Admitting: Audiology

## 2023-05-03 ENCOUNTER — Encounter (INDEPENDENT_AMBULATORY_CARE_PROVIDER_SITE_OTHER): Payer: Self-pay

## 2023-05-03 VITALS — BP 121/74 | HR 71 | Resp 18 | Ht 71.0 in | Wt 161.0 lb

## 2023-05-03 DIAGNOSIS — H9072 Mixed conductive and sensorineural hearing loss, unilateral, left ear, with unrestricted hearing on the contralateral side: Secondary | ICD-10-CM | POA: Diagnosis not present

## 2023-05-03 DIAGNOSIS — H7292 Unspecified perforation of tympanic membrane, left ear: Secondary | ICD-10-CM

## 2023-05-03 DIAGNOSIS — H663X2 Other chronic suppurative otitis media, left ear: Secondary | ICD-10-CM | POA: Diagnosis not present

## 2023-05-03 DIAGNOSIS — H9041 Sensorineural hearing loss, unilateral, right ear, with unrestricted hearing on the contralateral side: Secondary | ICD-10-CM

## 2023-05-03 DIAGNOSIS — H9202 Otalgia, left ear: Secondary | ICD-10-CM

## 2023-05-03 DIAGNOSIS — H90A32 Mixed conductive and sensorineural hearing loss, unilateral, left ear with restricted hearing on the contralateral side: Secondary | ICD-10-CM

## 2023-05-03 MED ORDER — CLINDAMYCIN HCL 300 MG PO CAPS: 300.0000 mg | ORAL_CAPSULE | Freq: Three times a day (TID) | ORAL | 0 refills | Status: AC

## 2023-05-03 NOTE — Progress Notes (Signed)
 Dear Dr. Katrinka, Here is my assessment for our mutual patient, William Mahoney. Thank you for allowing me the opportunity to care for your patient. Please do not hesitate to contact me should you have any other questions. Sincerely, Dr. Eldora Blanch  Otolaryngology Clinic Note Referring provider: Dr. Katrinka HPI:  William Mahoney is a 89 y.o. male kindly referred by Dr. Katrinka for evaluation of left otitis media and otitis externa.  Initial visit (03/2023): Patient reports: started to have left ear pain and throbbing about a month ago, went to Dr. Wendolyn and then Dr. Katrinka. Dr. Wendolyn cleaned the ear, noted a TM perforation, and prescribed cortisporin drop and omnicef  on 03/16/2023. The ear was irrigated with water. He took the abx but was having diarrhea so stopped, and then saw Dr. Katrinka who prescribed ciprofloxacin  (took them for only 2 days). He has been irrigating the ear with hydrogen peroxide. He reports he is having a dull ache in left ear but better than before and some fullness (some kind of blockage). He currently denies ear drainage. No hearing change, but daughter reports hearing decline Ears are typically not a problem. Q-tip use sometimes. No audiogram prior No throat problems since seeing Dr. Lendon for throat discomfort; some dysphagia which is stable; no mental status changes or other laryngology sx including voice change, other neck masses, throat pain  Patient denies: vertigo, tinnitus Patient additionally denies: eustachian tube symptoms such as popping, crackling, sensitive to pressure changes Patient also denies barotrauma, vestibular suppressant use Prior ear surgery: no --------------------------------------- He was noted to have a left TM perforation, and given prior symptoms we treated him for AOM and seen in follow up.  Initial visit (05/03/2023): Used the drops. He is reportedly doing much better now. Maybe some left ear dull ache, but certainly no pain or  throbbing. Keeping ear dry. No fullness or drainage.  He does now reports that he is having some radiating discomfort from back of neck (unclear, he does not know exactly) to shoulder/deltoid area and now with some right ear discomfort (on the auricle) too. No other issues. No swallowing or throat complaints. Does not hurt to turn the neck. He did have audiogram today  -------------------------------------------  H&N Surgery: denies Personal or FHx of bleeding dz or anesthesia difficulty: no  AP/AC: warfarin  Tobacco: quit 1980 (40 pack year history). Alcohol: no. 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 Lives in [place], [place]  PMHx: Diabetes, Dysphagia, A-fib, HTN, Insomnia, CKD, Aortic Aneurysm  Independent Review of Additional Tests or Records:  ENT Dr. Terri  (01/2022):  throat discomfort; began with URI sx mid-sept, rx amoxicillin  with some improvement; left ear pain, and no dysphagia or other problems except for some voice change. Soft palate asymmetry and had CT/MRI; at time of eval, did improve; TFL was normal at that point; TFL showed normal palate at that point and no asymmetry noted. Rx: Clindamycin , celestone; then lost to f/u Ref notes Dr. Katrinka and Dr. Wendolyn (02/2023 and 03/2023): notes reviewed and in chart; left ear pain worsened in Nov, ear clogged, using acetic acid /steroid drops; no drainage; no URI sx; Dx otitis externa, non-supprative otitis media and left TM rupture, Rx: cortisporin; ear irrigated; improved after Dr. Enos Rx, stopped abx Noted some dysphagia (food getting stuck), random; did not want further workup  CBC 03/2023: no leukoctosis; Plt 137 CMP: generally wnl  CT Neck 01/2022 and MRI 01/2022: reviewed independently, agree with left soft palate asymmetry with enhancement along left longus coli  and tonsillar fossa; no parotid or parapharyngeal space nodules; no significant retoropharyngeal edema or abscess; no other significant LN noted; mastoids clear; no  NP masses noted   05/03/2023 Audiogram was independently reviewed and interpreted by me and I agree with read - AD Type A tymp; left large vol Right ear- Borderline normal to severe sensorineural hearing loss from 250 Hz - 8000 Hz. Left ear-  Mild to severe mixed hearing loss from 250 Hz - 8000 Hz. Right ear: 72% was obtained at a presentation level of 85 dBHL with contralateral masking which is deemed as  fair; Left ear: 64% was obtained at a presentation level of 85 dBHL with contralateral masking which is deemed as  poor SNHL= Sensorineural hearing loss  CT Head 04/01/2023: independently reviewed with attention to ears - bilateral ME well aerated; some inferior opacification left mastoid; I do not see significant asymmetry in nasopharynx although left NP slightly more full. No significant mastoid erosion  04/01/2023 CBC no leukocytosis  PMH/Meds/All/SocHx/FamHx/ROS:   Past Medical History:  Diagnosis Date   BPH (benign prostatic hypertrophy)    Chronic atrial fibrillation [facility])    cardiologist--   dr berry   COPD (chronic obstructive pulmonary disease) (HCC)    Diverticulosis of colon    MODERATE LEFT SIDE   Epididymal cyst    w/ epididymalitis   Full dentures    Gout    Hand foot syndrome    secondary to chemotherapy (Xeloda)   cold hands/feet   Hearing loss    History of alcohol abuse    quit drinking in the mid 80's   History of cirrhosis of liver    alcoholic--  hx alcohol abuse -- quit drinking 1980's   History of gout    History of melanoma excision    2013--  left ear lobe and back of hand   History of pancreatitis    2008   History of rectal cancer oncologist-  dr cloretta--  no recurrence   dx Sept 2009--  Stage II (T3N0)  s/p  sigmoid colectomy & low anterior resection 04-18-2008  and chemoradiation 2010   History of shingles 07/27/2010   History of squamous cell carcinoma excision    left lower leg   Hyperlipidemia    Hypertension    Loose stools    DUE TO  ANTIBIOTICS   Macular degeneration    not sure which eye   OA (osteoarthritis)    RBBB (right bundle branch block)    Renal lesion    chronic-- left side   Sciatica of right side    Urge urinary incontinence    intermittant     Past Surgical History:  Procedure Laterality Date   CARDIAC CATHETERIZATION  2001  approx.  in [place]    CATARACT EXTRACTION W/ INTRAOCULAR LENS  IMPLANT, BILATERAL     COLONOSCOPY  last one 06-27-2012   EXPLORATORY LAPAROTOMY/  LOW ANTERIOR RESECTION/  SIGMOID COLECTOMY  04-18-2008   DR INGRAM   INCISION AND DRAINAGE ABSCESS N/A 11/02/2014   Procedure: INCISION AND DRAINAGE OF SCROTUM;  Surgeon: Morene LELON Salines, MD;  [facility];  Service: Urology;  Laterality: N/A;   INGUINAL HERNIA REPAIR Right 03/23/2014   Procedure: HERNIA REPAIR INGUINAL ADULT OPEN REPAIR RIGHT INGUINAL HERNIA REPAIR;  Surgeon: Elon Pacini, MD;  Location: [facility] OR;  Service: General;  Laterality: Right;   INSERTION OF MESH Right 03/23/2014   Procedure: INSERTION OF MESH;  Surgeon: Elon Pacini, MD;  Location: [facility]  OR;  Service: General;  Laterality: Right;   LAPAROSCOPIC CHOLECYSTECTOMY  1990's   MASS EXCISION N/A 11/02/2014   Procedure: EXCISION OF EPIDIDYMAL CYST;  Surgeon: Morene LELON Salines, MD;  Location: [facility];  Service: Urology;  Laterality: N/A;   TONSILLECTOMY  as child   TRANSTHORACIC ECHOCARDIOGRAM  05-11-2008   pseudonormal LV filling pattern,  ef 60-65%/  mild to moderate MV calcification no stenosis w/ mild to moderate regurg./  mild LAE and RAE/  mild TR    Family History  Problem Relation Age of Onset   Hypertension Mother    Lymphoma Father    Lymphoma Other    Stroke Other        1st degree relative   Colon cancer Neg Hx    Esophageal cancer Neg Hx    Rectal cancer Neg Hx    Stomach cancer Neg Hx      Social Connections: Socially Isolated (12/26/2021)   Social Connection and Isolation Panel [NHANES]    Frequency  of Communication with Friends and Family: Once a week    Frequency of Social Gatherings with Friends and Family: Once a week    Attends Religious Services: Never    Database Administrator or Organizations: No    Attends Banker Meetings: Never    Marital Status: Widowed      Current Outpatient Medications:    albuterol  (VENTOLIN  HFA) 108 (90 Base) MCG/ACT inhaler, Inhale 2 puffs into the lungs every 6 (six) hours as needed for wheezing or shortness of breath., Disp: 1 each, Rfl: 2   allopurinol  (ZYLOPRIM ) 300 MG tablet, TAKE ONE-HALF TABLET BY MOUTH IN THE EVENING, Disp: 45 tablet, Rfl: 3   amLODipine  (NORVASC ) 5 MG tablet, Take 1 tablet (5 mg total) by mouth daily. For blood pressure and possible Raynauds, Disp: 90 tablet, Rfl: 3   ammonium lactate (LAC-HYDRIN) 12 % lotion, PLEASE SEE ATTACHED FOR DETAILED DIRECTIONS, Disp: , Rfl:    clindamycin  (CLEOCIN ) 300 MG capsule, Take 1 capsule (300 mg total) by mouth 3 (three) times daily for 14 days., Disp: 42 capsule, Rfl: 0   fluocinonide  (LIDEX ) 0.05 % external solution, APPLY 1 ML TO AFFECTED AREAS OF SCALP AND CHEST NIGHTLY, Disp: 60 mL, Rfl: 3   fluticasone  (FLONASE ) 50 MCG/ACT nasal spray, Place 2 sprays into both nostrils daily., Disp: 48 g, Rfl: 3   furosemide  (LASIX ) 20 MG tablet, TAKE 1 TABLET BY MOUTH EVERY DAY AS NEEDED, Disp: 90 tablet, Rfl: 1   Multiple Vitamin (MULTIVITAMIN) tablet, Take 1 tablet by mouth daily., Disp: , Rfl:    tamsulosin  (FLOMAX ) 0.4 MG CAPS capsule, Take 1 capsule (0.4 mg total) by mouth daily. (Patient taking differently: Take 0.4 mg by mouth daily as needed (Prostate).), Disp: 90 capsule, Rfl: 3   triamterene -hydrochlorothiazide (MAXZIDE-25) 37.5-25 MG tablet, TAKE 1/2 TABLET BY MOUTH DAILY (Patient taking differently: Take 0.5 tablets by mouth daily as needed. 1 tablet every day and on Wednesdays - takes 1.5 tablet.), Disp: 45 tablet, Rfl: 1   warfarin (COUMADIN ) 2.5 MG tablet, TAKE 1 TABLET BY MOUTH  DAILY OR AS DIRECTED BY ANTICOAGULATION CLINIC, Disp: 110 tablet, Rfl: 1   zolpidem  (AMBIEN ) 5 MG tablet, TAKE 1 TABLET BY MOUTH EVERY DAY AT BEDTIME AS NEEDED FOR SLEEP, Disp: 30 tablet, Rfl: 5   Physical Exam:   BP 121/74 (BP Location: Left Arm, Patient Position: Sitting, Cuff Size: Normal)   Pulse 71   Resp 18   Ht 5'  11 (1.803 m)   Wt 161 lb (73 kg)   SpO2 94%   BMI 22.45 kg/m   Salient findings:  CN II-XII intact Given history and complaints, bilateral ear microscopy was indicated and performed for evaluation with findings as below in physical exam section and in procedures Right ear: EAC clear and TM intact with well pneumatized middle ear spaces Left ear EAC clear; left TM perforation - ~10-15% anteroinferior, clean, no debris or drainage. Ear looks good today. No cholesteatoma or migrating epithelial debris No erythema of ears; no postauricular pain or proptosis or swelling. Weber 512: inconsistent mid, and left Rinne 512: AC > BC b/l  Anterior rhinoscopy: Septum relatively midline; no masses noted No lesions of oral cavity/oropharynx; palate elevation occurs bilaterally, but left is more sluggish than right and there is some mild asymmetry; erythema or tenderness or significant fullness today. Parotids without masses; palpable tongue base without abnormality No obviously palpable neck masses/lymphadenopathy/thyromegaly No respiratory distress or stridor  Seprately Identifiable Procedures:   Procedure: Bilateral ear microscopy using microscope (CPT 92504) Pre-procedure diagnosis: TM perforation, otitis media Post-procedure diagnosis: same Indication:seee above; given patient's otologic complaints and history, for improved and comprehensive examination of external ear and tympanic membrane, bilateral otologic examination using microscope was performed  Procedure: Patient was placed semi-recumbent. Both ear canals were examined using the microscope with findings above. Some  cerumen removed on left and on right using suction and currette Patient tolerated the procedure well.   Impression & Plans:  William Mahoney is a 89 y.o. male with:  1. Mixed conductive and sensorineural hearing loss of left ear with restricted hearing of right ear   2. Tympanic membrane perforation, left   3. Other chronic suppurative otitis media of left ear   4. Otalgia of left ear   5. Palate asymmetry  Left ear looks much improved compared to prior exam; does have small dry perforation.  It is unclear to me why he is having the radiating ear pain. He has had prior scans for palate asymmetry (which is mild but persists) and small enhancing area along left longus coli and tonsillar fossa. He was prior Rx with abx/steroids and this improved things. I do not know if this would cause this, and we did discuss this. However, given his age, will treat him empirically for any infxn first and try one more course of abx.  It is possible that perhaps this is a musculoskeletal complaint given his isolated ear complaint has improved significantly.  e also had a recent [facility] after a fall and this also looks generally reassuring - possible masked mastoiditis? No evidence of MOE.  Of note, we discussed a possible paper patch in case this is just sensitivity and he does have some hearing loss as well (which might improve temporarily with a patch). He is quite interested in this and we will consider this at next visit depending on status of ear  1) Keep ear dry 2) Clindamycin  300mg  x14d 3) Warm compresses 4) Do not think drops are needed 5) TFL at next visit as well to eval for any other occult lesions 6) can consider MRI for asymmetry but given age, I do not think putting him through MRI would be warranted given most Schwannomas slow growing.  F/u 3-4 weeks  See below regarding exact medications prescribed this encounter including dosages and route: Meds ordered this encounter  Medications    clindamycin  (CLEOCIN ) 300 MG capsule    Sig: Take 1 capsule (300 mg  total) by mouth 3 (three) times daily for 14 days.    Dispense:  42 capsule    Refill:  0      Thank you for allowing me the opportunity to care for your patient. Please do not hesitate to contact me should you have any other questions.  Sincerely, Eldora Blanch, MD Otolarynoglogist (ENT), [facility] Phone: [phone] Fax: [phone]  05/03/2023, 5:45 PM   MDM:  Level 4 - 99214 Complexity/Problems addressed: mod - multiple chronic problems, one new problem Data complexity: mod - independent review and interpretation of CT Head 04/01/2023 - Morbidity: mod  - Prescription Drug prescribed or managed: yes

## 2023-05-03 NOTE — Progress Notes (Signed)
  [address] [place], [place] [address] [phone]  Audiological Evaluation    Name: William Mahoney     DOB:   [birth year]      MRN:   [id number]                                                                                     Service Date: 05/03/2023     Accompanied by: unaccompanied ( daughter stayed on the waiting room)   Patient comes today after Dr. Tobie, ENT sent a referral for a hearing evaluation due to concerns with left eardrum perforation and left ear hearing loss.   Symptoms Yes Details  Hearing loss  [x]  Left is worse that right since the perforation  Tinnitus  []    Ear pain/ Ear infections  [x]  Sometimes in the left ear and radiates to his shoulder  Balance problems  [x]  Off balance- seldom - while walking or when standing up  Noise exposure  [x]  [employer] for 4 years  Previous ear surgeries  []    Family history  []    Amplification  []    Other  []      Otoscopy: Right ear: Clear external ear canals and notable landmarks visualized on the tympanic membrane. Left ear:  Abnormal eardrum appearance. *Attempted different probes- unable to obtain a seal*. Patients canals are a somewhat collapsed.  Tympanometry: Right ear: Type A- Normal external ear canal volume with normal middle ear pressure and tympanic membrane compliance Left ear: Could not obtain seal; middle ear status is unable to be determined at this time.     Pure tone Audiometry: Right ear- Borderline normal to severe sensorineural hearing loss from 250 Hz - 8000 Hz. Left ear-  Mild to severe mixed hearing loss from 250 Hz - 8000 Hz.  The hearing test results were completed under inserts and results are deemed to be of good to fair reliability. Test technique:  conventional     Speech Audiometry: Right ear- Speech Reception Threshold (SRT) was obtained at 30 dBHL Left ear-Speech Reception Threshold (SRT) was obtained at 40 dBHL   Word Recognition Score Tested using NU-6  (MLV) Right ear: 72% was obtained at a presentation level of 85 dBHL with contralateral masking which is deemed as  fair Left ear: 64% was obtained at a presentation level of 85 dBHL with contralateral masking which is deemed as  poor    Impression: There is a significant difference in pure-tone thresholds between ears.   Recommendations: Follow up with ENT as scheduled for today. Repeat audiogram after medical care.   Talyssa Gibas MARIE LEROUX-MARTINEZ, AUD

## 2023-05-06 ENCOUNTER — Ambulatory Visit: Payer: Medicare Other | Admitting: Podiatry

## 2023-05-06 ENCOUNTER — Encounter: Payer: Self-pay | Admitting: Podiatry

## 2023-05-06 DIAGNOSIS — B351 Tinea unguium: Secondary | ICD-10-CM | POA: Diagnosis not present

## 2023-05-06 DIAGNOSIS — N1831 Chronic kidney disease, stage 3a: Secondary | ICD-10-CM

## 2023-05-06 DIAGNOSIS — M79675 Pain in left toe(s): Secondary | ICD-10-CM

## 2023-05-06 DIAGNOSIS — M79674 Pain in right toe(s): Secondary | ICD-10-CM

## 2023-05-06 NOTE — Progress Notes (Signed)
This patient returns to my office for at risk foot care.  This patient requires this care by a professional since this patient will be at risk due to having  PAD, CKD coagulation defect and thrombocytopenia.  Patient is taking coumadin.  This patient is unable to cut nails himself since the patient cannot reach his nails.These nails are painful walking and wearing shoes. This patient presents for at risk foot care today.  General Appearance  Alert, conversant and in no acute stress.  Vascular  Dorsalis pedis and posterior tibial  pulses are  weakly palpable  bilaterally.  Capillary return is within normal limits  bilaterally. Temperature is within normal limits  bilaterally. Venous disease feet dorsally  B/l.  Neurologic  Senn-Weinstein monofilament wire test within normal limits  bilaterally. Muscle power within normal limits bilaterally.  Nails Thick disfigured discolored nails with subungual debris  from hallux to fifth toes bilaterally. No evidence of bacterial infection or drainage bilaterally.  Orthopedic  No limitations of motion  feet .  No crepitus or effusions noted.  No bony pathology or digital deformities noted.  Skin  normotropic skin noted bilaterally.  No signs of infections or ulcers noted.     Onychomycosis  Pain in right toes  Pain in left toes  Porokeratosis left heel.    Consent was obtained for treatment procedures.   Mechanical debridement of nails 1-5  bilaterally performed with a nail nipper.  Filed with dremel without incident.    Return office visit    3  months                  Told patient to return for periodic foot care and evaluation due to potential at risk complications.   Abree Romick DPM   

## 2023-05-21 ENCOUNTER — Telehealth (INDEPENDENT_AMBULATORY_CARE_PROVIDER_SITE_OTHER): Payer: Self-pay | Admitting: Otolaryngology

## 2023-05-21 NOTE — Telephone Encounter (Signed)
Confirmed appt & location [id number] afm

## 2023-05-24 ENCOUNTER — Ambulatory Visit (INDEPENDENT_AMBULATORY_CARE_PROVIDER_SITE_OTHER): Payer: Medicare Other | Admitting: Otolaryngology

## 2023-05-24 ENCOUNTER — Encounter (INDEPENDENT_AMBULATORY_CARE_PROVIDER_SITE_OTHER): Payer: Self-pay

## 2023-05-24 VITALS — BP 130/82 | HR 62 | Ht 70.5 in | Wt 160.0 lb

## 2023-05-24 DIAGNOSIS — H7292 Unspecified perforation of tympanic membrane, left ear: Secondary | ICD-10-CM

## 2023-05-24 DIAGNOSIS — H9041 Sensorineural hearing loss, unilateral, right ear, with unrestricted hearing on the contralateral side: Secondary | ICD-10-CM | POA: Diagnosis not present

## 2023-05-24 DIAGNOSIS — J392 Other diseases of pharynx: Secondary | ICD-10-CM

## 2023-05-24 DIAGNOSIS — G8929 Other chronic pain: Secondary | ICD-10-CM

## 2023-05-24 DIAGNOSIS — M25512 Pain in left shoulder: Secondary | ICD-10-CM

## 2023-05-24 DIAGNOSIS — H9202 Otalgia, left ear: Secondary | ICD-10-CM

## 2023-05-24 DIAGNOSIS — H90A32 Mixed conductive and sensorineural hearing loss, unilateral, left ear with restricted hearing on the contralateral side: Secondary | ICD-10-CM

## 2023-05-24 NOTE — Progress Notes (Signed)
Dear Dr. Durene Cal, Here is my assessment for our mutual patient, William Mahoney. Thank you for allowing me the opportunity to care for your patient. Please do not hesitate to contact me should you have any other questions. Sincerely, Dr. Jovita Kussmaul  Otolaryngology Clinic Note Referring provider: Dr. Durene Cal HPI:  William Mahoney is a 89 y.o. male kindly referred by Dr. Durene Cal for evaluation of left otitis media and otitis externa.  Initial visit (03/2023): Patient reports: started to have left ear pain and throbbing about a month ago, went to Dr. Ruthine Dose and then Dr. Durene Cal. Dr. Ruthine Dose cleaned the ear, noted a TM perforation, and prescribed cortisporin drop and omnicef on 03/16/2023. The ear was irrigated with water. He took the abx but was having diarrhea so stopped, and then saw Dr. Durene Cal who prescribed ciprofloxacin (took them for only 2 days). He has been irrigating the ear with hydrogen peroxide. He reports he is having a dull ache in left ear but better than before and some fullness (some kind of blockage). He currently denies ear drainage. No hearing change, but daughter reports hearing decline Ears are typically not a problem. Q-tip use sometimes. No audiogram prior No throat problems since seeing Dr. Arley Phenix for throat discomfort; some dysphagia which is stable; no mental status changes or other laryngology sx including voice change, other neck masses, throat pain  Patient denies: vertigo, tinnitus Patient additionally denies: eustachian tube symptoms such as popping, crackling, sensitive to pressure changes Patient also denies barotrauma, vestibular suppressant use Prior ear surgery: no --------------------------------------- He was noted to have a left TM perforation, and given prior symptoms we treated him for AOM and seen in follow up.  Initial visit (05/03/2023): Used the drops. He is reportedly doing much better now. Maybe some left ear dull ache, but certainly no pain or  throbbing. Keeping ear dry. No fullness or drainage.  He does now reports that he is having some radiating discomfort from back of neck (unclear, he does not know exactly) to shoulder/deltoid area and now with some right ear discomfort (on the auricle) too. No other issues. No swallowing or throat complaints. Does not hurt to turn the neck. He did have audiogram today ------------------------------------------- Given pain in left deltoid area and small amount of asymmetry of palate, we discussed repeat Abx and he agreed. He returns today for re-check  05/24/2023 He is doing about a same from shoulder standpoint - still has pain mostly over deltoid and distal trapezius area (not posteriorly). He reports ear is doing ok - no significant pain. Keeping ear dry. No fullness, drainage No swallowing or throat complaints. After discussion, he would like to proceed with paper patch to see if it would help.  --------------------------------------------- H&N Surgery: denies Personal or FHx of bleeding dz or anesthesia difficulty: no  AP/AC: warfarin  Tobacco: quit 1980 (40 pack year history). Alcohol: no. 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 Lives in [place], [place]  PMHx: Diabetes, Dysphagia, A-fib, HTN, Insomnia, CKD, Aortic Aneurysm  Independent Review of Additional Tests or Records:  ENT Dr. Doran Heater  (01/2022):  throat discomfort; began with URI sx mid-sept, rx amoxicillin with some improvement; left ear pain, and no dysphagia or other problems except for some voice change. Soft palate asymmetry and had CT/MRI; at time of eval, did improve; TFL was normal at that point; TFL showed normal palate at that point and no asymmetry noted. Rx: Clindamycin, celestone; then lost to f/u Ref notes Dr. Durene Cal and Dr. Ruthine Dose (02/2023 and 03/2023):  notes reviewed and in chart; left ear pain worsened in Nov, ear clogged, using acetic acid/steroid drops; no drainage; no URI sx; Dx otitis externa, non-supprative otitis  media and left TM rupture, Rx: cortisporin; ear irrigated; improved after Dr. Myrtie Cruise Rx, stopped abx Noted some dysphagia (food getting stuck), random; did not want further workup  CBC 03/2023: no leukoctosis; Plt 137 CMP: generally wnl  CT Neck 01/2022 and MRI 01/2022: reviewed independently, agree with left soft palate asymmetry with enhancement along left longus coli and tonsillar fossa; no parotid or parapharyngeal space nodules; no significant retoropharyngeal edema or abscess; no other significant LN noted; mastoids clear; no NP masses noted   05/03/2023 Audiogram was independently reviewed and interpreted by me and I agree with read - AD Type A tymp; left large vol Right ear- Borderline normal to severe sensorineural hearing loss from 250 Hz - 8000 Hz. Left ear-  Mild to severe mixed hearing loss from 250 Hz - 8000 Hz. Right ear: 72% was obtained at a presentation level of 85 dBHL with contralateral masking which is deemed as  fair; Left ear: 64% was obtained at a presentation level of 85 dBHL with contralateral masking which is deemed as  poor  SNHL= Sensorineural hearing loss  CT Head 04/01/2023: independently reviewed with attention to ears - bilateral ME well aerated; some inferior opacification left mastoid; I do not see significant asymmetry in nasopharynx although left NP slightly more full. No significant mastoid erosion  04/01/2023 CBC no leukocytosis  PMH/Meds/All/SocHx/FamHx/ROS:   Past Medical History:  Diagnosis Date   BPH (benign prostatic hypertrophy)    Chronic atrial fibrillation [facility])    cardiologist--   dr berry   COPD (chronic obstructive pulmonary disease) (HCC)    Diverticulosis of colon    MODERATE LEFT SIDE   Epididymal cyst    w/ epididymalitis   Full dentures    Gout    Hand foot syndrome    secondary to chemotherapy (Xeloda)   cold hands/feet   Hearing loss    History of alcohol abuse    quit drinking in the mid 80's   History of cirrhosis of  liver    alcoholic--  hx alcohol abuse -- quit drinking 1980's   History of gout    History of melanoma excision    2013--  left ear lobe and back of hand   History of pancreatitis    2008   History of rectal cancer oncologist-  dr Truett Perna--  no recurrence   dx Sept 2009--  Stage II (T3N0)  s/p  sigmoid colectomy & low anterior resection 04-18-2008  and chemoradiation 2010   History of shingles 07/27/2010   History of squamous cell carcinoma excision    left lower leg   Hyperlipidemia    Hypertension    Loose stools    DUE TO ANTIBIOTICS   Macular degeneration    not sure which eye   OA (osteoarthritis)    RBBB (right bundle branch block)    Renal lesion    chronic-- left side   Sciatica of right side    Urge urinary incontinence    intermittant     Past Surgical History:  Procedure Laterality Date   CARDIAC CATHETERIZATION  2001  approx.  in [place]   CATARACT EXTRACTION W/ INTRAOCULAR LENS  IMPLANT, BILATERAL     COLONOSCOPY  last one 06-27-2012   EXPLORATORY LAPAROTOMY/  LOW ANTERIOR RESECTION/  SIGMOID COLECTOMY  04-18-2008   DR Derrell Lolling   INCISION  AND DRAINAGE ABSCESS N/A 11/02/2014   Procedure: INCISION AND DRAINAGE OF SCROTUM;  Surgeon: Crist Fat, MD;  [facility];  Service: Urology;  Laterality: N/A;   INGUINAL HERNIA REPAIR Right 03/23/2014   Procedure: HERNIA REPAIR INGUINAL ADULT OPEN REPAIR RIGHT INGUINAL HERNIA REPAIR;  Surgeon: Claud Kelp, MD;  Location[facility];  Service: General;  Laterality: Right;   INSERTION OF MESH Right 03/23/2014   Procedure: INSERTION OF MESH;  Surgeon: Claud Kelp, MD;  Location: [facility] OR;  Service: General;  Laterality: Right;   LAPAROSCOPIC CHOLECYSTECTOMY  1990's   MASS EXCISION N/A 11/02/2014   Procedure: EXCISION OF EPIDIDYMAL CYST;  Surgeon: Crist Fat, MD;  Location: [facility];  Service: Urology;  Laterality: N/A;   TONSILLECTOMY  as child   TRANSTHORACIC ECHOCARDIOGRAM   05-11-2008   pseudonormal LV filling pattern,  ef 60-65%/  mild to moderate MV calcification no stenosis w/ mild to moderate regurg./  mild LAE and RAE/  mild TR    Family History  Problem Relation Age of Onset   Hypertension Mother    Lymphoma Father    Lymphoma Other    Stroke Other        1st degree relative   Colon cancer Neg Hx    Esophageal cancer Neg Hx    Rectal cancer Neg Hx    Stomach cancer Neg Hx      Social Connections: Socially Isolated (12/26/2021)   Social Connection and Isolation Panel [NHANES]    Frequency of Communication with Friends and Family: Once a week    Frequency of Social Gatherings with Friends and Family: Once a week    Attends Religious Services: Never    Database administrator or Organizations: No    Attends Banker Meetings: Never    Marital Status: Widowed      Current Outpatient Medications:    albuterol (VENTOLIN HFA) 108 (90 Base) MCG/ACT inhaler, Inhale 2 puffs into the lungs every 6 (six) hours as needed for wheezing or shortness of breath., Disp: 1 each, Rfl: 2   allopurinol (ZYLOPRIM) 300 MG tablet, TAKE ONE-HALF TABLET BY MOUTH IN THE EVENING, Disp: 45 tablet, Rfl: 3   amLODipine (NORVASC) 5 MG tablet, Take 1 tablet (5 mg total) by mouth daily. For blood pressure and possible Raynauds, Disp: 90 tablet, Rfl: 3   ammonium lactate (LAC-HYDRIN) 12 % lotion, PLEASE SEE ATTACHED FOR DETAILED DIRECTIONS, Disp: , Rfl:    fluocinonide (LIDEX) 0.05 % external solution, APPLY 1 ML TO AFFECTED AREAS OF SCALP AND CHEST NIGHTLY, Disp: 60 mL, Rfl: 3   fluticasone (FLONASE) 50 MCG/ACT nasal spray, Place 2 sprays into both nostrils daily., Disp: 48 g, Rfl: 3   furosemide (LASIX) 20 MG tablet, TAKE 1 TABLET BY MOUTH EVERY DAY AS NEEDED, Disp: 90 tablet, Rfl: 1   Multiple Vitamin (MULTIVITAMIN) tablet, Take 1 tablet by mouth daily., Disp: , Rfl:    tamsulosin (FLOMAX) 0.4 MG CAPS capsule, Take 1 capsule (0.4 mg total) by mouth daily. (Patient  taking differently: Take 0.4 mg by mouth daily as needed (Prostate).), Disp: 90 capsule, Rfl: 3   triamterene-hydrochlorothiazide (MAXZIDE-25) 37.5-25 MG tablet, TAKE 1/2 TABLET BY MOUTH DAILY (Patient taking differently: Take 0.5 tablets by mouth daily as needed. 1 tablet every day and on Wednesdays - takes 1.5 tablet.), Disp: 45 tablet, Rfl: 1   warfarin (COUMADIN) 2.5 MG tablet, TAKE 1 TABLET BY MOUTH DAILY OR AS DIRECTED BY ANTICOAGULATION CLINIC,  Disp: 110 tablet, Rfl: 1   zolpidem (AMBIEN) 5 MG tablet, TAKE 1 TABLET BY MOUTH EVERY DAY AT BEDTIME AS NEEDED FOR SLEEP, Disp: 30 tablet, Rfl: 5   Physical Exam:   BP 130/82 (BP Location: Right Arm, Patient Position: Sitting, Cuff Size: Normal)   Pulse 62   Ht 5' 10.5" (1.791 m)   Wt 160 lb (72.6 kg)   SpO2 95%   BMI 22.63 kg/m   Salient findings:  CN II-XII intact - slight palate asymmetry but clearly does raise Given history and complaints, bilateral ear microscopy was indicated and performed for evaluation with findings as below in physical exam section and in procedures Right ear: EAC clear and TM intact with well pneumatized middle ear spaces Left ear EAC clear; left TM perforation - ~10-15% anteroinferior, clean, no debris or drainage. No cholesteatoma or migrating epithelial debris -- paper patch placed (see below) with some improvement in hearing No erythema of ears; no postauricular pain or proptosis or swelling. Weber 512: inconsistent mid, and left Rinne 512: AC > BC b/l  Anterior rhinoscopy: Septum relatively midline; no masses noted No lesions of oral cavity/oropharynx; palate elevation occurs bilaterally, but left is more sluggish than right and there is some mild asymmetry; erythema or tenderness. Given concern for carcinoma based on prior asymmetry and lack of response to abx, TFL was indicated and performed as well. Parotids without masses; palpable tongue base without abnormality No obviously palpable neck  masses/lymphadenopathy/thyromegaly No respiratory distress or stridor  Seprately Identifiable Procedures:  PRE-Proecedure DIAGNOSIS: Tympanic membrane perforation - left POST-Procedure DIAGNOSIS: Same PROCEDURE: Left tympanic membrane repair for perforation with paper patch - CPT [phone] Surgeon(s) and Role: Jovita Kussmaul, MD INDICATIONS:  Laura Radilla is a 89 y.o. male with left tympanic membrane perforation. We discussed R/B/A for myringoplasty v/s tympanoplasty v/s observation and he opted for myringoplasty. I did discuss R/B/A including future infections, lack of closure, persistent symptoms and hearing loss among others and patient opted to proceed after consent  FINDINGS:  Left 15% clean perforation anteroinferior; successful paper patch myringoplasty  PROCEDURE DETAILS:  After being properly identified, the patient placed supine. Consent was obtained.  The left ear was inspected under the microscope.  Using a speculum and a curette, cerumen was removed from the external auditory canal.  With the TM fully in view, the edges of the TM perforation were roughed up and freshened with a rasp.  With the edges bleeding, a paper patch was placed over the perforation. Two ciprodex drops were placed to slightly moisten the patch to promote adherence. A cotton ball was then placed in the ear canal.  The patient tolerated the procedure well  COMPLICATIONS:  None apparent   Procedure Note Pre-procedure diagnosis: Palate asymmetry, history of posterior oropharyngeal enhancing lesion, concern for carcinoma Post-procedure diagnosis: Same Procedure: Transnasal Fiberoptic Laryngoscopy, CPT 31575 - Mod 25 Indication: see above Complications: None apparent EBL: 0 mL  The procedure was undertaken to further evaluation given lack of response to treatment and rule out carcinoma, with mirror exam inadequate for appropriate examination due to gag reflex and poor patient tolerance  Procedure:  Patient  was identified as correct patient. Verbal consent was obtained. The nose was sprayed with oxymetazoline and 4% lidocaine. The The flexible laryngoscope was passed through the nose to view the nasal cavity, pharynx (oropharynx, hypopharynx) and larynx.  The larynx was examined at rest and during multiple phonatory tasks. Documentation was obtained and reviewed with patient. The scope was removed. The  patient tolerated the procedure well.  Findings: The nasal cavity and nasopharynx did not reveal any masses or lesions except as below (see below). Palate elevates bilaterally. The tongue base, pharyngeal walls, piriform sinuses, vallecula, epiglottis and postcricoid region are normal in appearance EXCEPT: slight fullness left posterior OP and NP - no obvious erosion or tenderness on palpation with scope. The visualized portion of the subglottis and proximal trachea is widely patent. The vocal folds are mobile bilaterally. There are no lesions on the free edge of the vocal folds nor elsewhere in the larynx worrisome for malignancy.       Electronically signed by: Read Drivers, MD 05/24/2023 12:08 PM   Impression & Plans:  Caellum Mancil is a 89 y.o. male with:  1. Mixed conductive and sensorineural hearing loss of left ear with restricted hearing of right ear   2. Tympanic membrane perforation, left   3. Otalgia of left ear   4. Chronic left shoulder pain   5. Lesion of oropharynx    Left ear looks stable and dry; after R/B/A discussion, did perform paper patch myringoplasty for closure attempt; we discussed tympanoplasty but given his age and co-morbidities and the fact that this is not a marginal perforation, do not think tympanoplasty would be necessary given risks  Ear pain has improved but now has some shoulder/neck pain. Unclear cause - perhaps musculoskeletal. He has had prior scans for palate asymmetry (which is mild but persists) and small enhancing area along left longus coli and posterior  OP which is I think apparent on TFL today He was prior Rx with abx/steroids. I do not know if this would cause this, and we did discuss the persistent asymmetry. I recommended repeat CT but he declined despite risk of potential undiagnosed carcinoma. It is possible that perhaps this is a musculoskeletal complaint given his isolated ear complaint has improved significantly and it is primarily the shoulder that is bothering him. He also had a recent [facility] after a fall and this also looks generally reassuring - possible masked mastoiditis? No evidence of MOE.  1) Keep ear dry 2) Agree with musculoskeletal workup for shoulder pain 3) CT declined despite discussion as above; advised him to contact us should he change his mind 4) can consider MRI for asymmetry but given age, I do not think putting him through MRI would be warranted given most Schwannomas slow growing.  F/u 3 months, sooner if any other concerns  See below regarding exact medications prescribed this encounter including dosages and route: No orders of the defined types were placed in this encounter.   Thank you for allowing me the opportunity to care for your patient. Please do not hesitate to contact me should you have any other questions.  Sincerely, Jovita Kussmaul, MD Otolaryngologist (ENT), [facility] Phone: [phone] Fax: [phone]  05/24/2023, 12:08 PM   MDM:  Level 4 - 99213 Complexity/Problems addressed: mod - multiple chronic problems Data complexity: low - Morbidity: low  - Prescription Drug prescribed or managed: yes

## 2023-05-25 ENCOUNTER — Ambulatory Visit: Payer: Medicare Other | Admitting: Family Medicine

## 2023-05-25 ENCOUNTER — Encounter: Payer: Self-pay | Admitting: Family Medicine

## 2023-05-25 ENCOUNTER — Ambulatory Visit: Payer: Medicare Other

## 2023-05-25 VITALS — BP 136/84 | HR 65 | Temp 97.6°F | Ht 70.5 in | Wt 165.4 lb

## 2023-05-25 DIAGNOSIS — E119 Type 2 diabetes mellitus without complications: Secondary | ICD-10-CM | POA: Diagnosis not present

## 2023-05-25 DIAGNOSIS — M25512 Pain in left shoulder: Secondary | ICD-10-CM

## 2023-05-25 DIAGNOSIS — I4891 Unspecified atrial fibrillation: Secondary | ICD-10-CM

## 2023-05-25 DIAGNOSIS — I10 Essential (primary) hypertension: Secondary | ICD-10-CM

## 2023-05-25 DIAGNOSIS — I714 Abdominal aortic aneurysm, without rupture, unspecified: Secondary | ICD-10-CM

## 2023-05-25 DIAGNOSIS — G8929 Other chronic pain: Secondary | ICD-10-CM

## 2023-05-25 DIAGNOSIS — R600 Localized edema: Secondary | ICD-10-CM | POA: Diagnosis not present

## 2023-05-25 MED ORDER — FUROSEMIDE 20 MG PO TABS: 20.0000 mg | ORAL_TABLET | Freq: Every day | ORAL | 1 refills | Status: DC | PRN

## 2023-05-25 MED ORDER — TERBINAFINE HCL 250 MG PO TABS: 250.0000 mg | ORAL_TABLET | Freq: Every day | ORAL | 0 refills | Status: DC

## 2023-05-25 NOTE — Patient Instructions (Addendum)
 Terbinafine  for nail fungus on right hand thumb- call coumadin  clinic as they may need to adjust your dosing  X-ray on shoulder before you leave  Please stop by lab before you go If you have mychart- we will send your results within 3 business days of us  receiving them.  If you do not have mychart- we will call you about results within 5 business days of us  receiving them.  *please also note that you will see labs on mychart as soon as they post. I will later go in and write notes on them- will say notes from Dr. Katrinka   Recommended follow up: Return in about 2 months (around 07/23/2023) for followup or sooner if needed.Schedule b4 you leave.

## 2023-05-25 NOTE — Progress Notes (Signed)
 Phone [phone] In person visit   Subjective:   William Mahoney is a 89 y.o. year old very pleasant male patient who presents for/with See problem oriented charting Chief Complaint  Patient presents with   Follow-up    Pt here for f/u 2 month f.u    Hypertension   Hyperlipidemia   Shoulder Injury    Dec 12th pt fell and hurt his shoulder - pt c/o of constant left shoulder pain since, pt has tried Tylenol  that does not help.    Anxiety   Past Medical History-  Patient Active Problem List   Diagnosis Date Noted   Diabetes mellitus type 2, controlled (HCC) 03/24/2023    Priority: High   AAA (abdominal aortic aneurysm) without rupture 11/20/2019    Priority: High   Edema 11/10/2016    Priority: High   Atrial fibrillation (HCC) 12/07/2008    Priority: High   PAD (peripheral artery disease) (HCC) 11/20/2019    Priority: Medium    Chronic kidney disease (CKD), stage III (moderate) (HCC) 02/15/2019    Priority: Medium    Aortic atherosclerosis (HCC) 01/05/2019    Priority: Medium    Insomnia 10/19/2014    Priority: Medium    Gout 01/09/2014    Priority: Medium    History of cancer of rectosigmoid junction 01/04/2008    Priority: Medium    Former smoker 06/23/2007    Priority: Medium    Hyperlipidemia, unspecified 02/09/2007    Priority: Medium    Essential hypertension 12/07/2006    Priority: Medium    COPD (chronic obstructive pulmonary disease) (HCC) 12/07/2006    Priority: Medium    BPH (benign prostatic hyperplasia) 12/07/2006    Priority: Medium    Encounter for therapeutic drug monitoring 08/27/2014    Priority: Low   Allergic rhinitis 08/15/2013    Priority: Low   Arthritis of lumbar spine 01/20/2013    Priority: Low   Inguinal hernia 03/21/2010    Priority: Low   BASAL CELL CARCINOMA SKIN LOWER LIMB INCL HIP 03/21/2010    Priority: Low   Actinic keratosis 12/07/2008    Priority: Low   TMJ (temporomandibular joint disorder) 02/09/2007    Priority: Low    URINARY INCONTINENCE 12/07/2006    Priority: Low   History of digestive system disease 12/07/2006    Priority: Low   Porokeratosis 11/25/2021    Medications- reviewed and updated Current Outpatient Medications  Medication Sig Dispense Refill   albuterol  (VENTOLIN  HFA) 108 (90 Base) MCG/ACT inhaler Inhale 2 puffs into the lungs every 6 (six) hours as needed for wheezing or shortness of breath. 1 each 2   allopurinol  (ZYLOPRIM ) 300 MG tablet TAKE ONE-HALF TABLET BY MOUTH IN THE EVENING 45 tablet 3   amLODipine  (NORVASC ) 5 MG tablet Take 1 tablet (5 mg total) by mouth daily. For blood pressure and possible Raynauds 90 tablet 3   fluticasone  (FLONASE ) 50 MCG/ACT nasal spray Place 2 sprays into both nostrils daily. 48 g 3   Multiple Vitamin (MULTIVITAMIN) tablet Take 1 tablet by mouth daily.     tamsulosin  (FLOMAX ) 0.4 MG CAPS capsule Take 1 capsule (0.4 mg total) by mouth daily. (Patient taking differently: Take 0.4 mg by mouth daily as needed (Prostate).) 90 capsule 3   terbinafine  (LAMISIL ) 250 MG tablet Take 1 tablet (250 mg total) by mouth daily. For toenail fungus/onychomycosis 45 tablet 0   triamterene -hydrochlorothiazide (MAXZIDE-25) 37.5-25 MG tablet TAKE 1/2 TABLET BY MOUTH DAILY (Patient taking differently: Take 0.5 tablets by mouth  daily as needed. 1 tablet every day and on Wednesdays - takes 1.5 tablet.) 45 tablet 1   warfarin (COUMADIN ) 2.5 MG tablet TAKE 1 TABLET BY MOUTH DAILY OR AS DIRECTED BY ANTICOAGULATION CLINIC 110 tablet 1   zolpidem  (AMBIEN ) 5 MG tablet TAKE 1 TABLET BY MOUTH EVERY DAY AT BEDTIME AS NEEDED FOR SLEEP 30 tablet 5   furosemide  (LASIX ) 20 MG tablet Take 1 tablet (20 mg total) by mouth daily as needed for edema or fluid. 90 tablet 1   No current facility-administered medications for this visit.     Objective:  BP 136/84   Pulse 65   Temp 97.6 F (36.4 C)   Ht 5' 10.5 (1.791 m)   Wt 165 lb 6.4 oz (75 kg)   SpO2 98%   BMI 23.40 kg/m  Gen: NAD,  resting comfortably Patch on left tympanic membrane appears stable CV: irregularly irregular  Lungs: CTAB no crackles, wheeze, rhonchi Abdomen: soft/nontender/nondistended/normal bowel sounds. No rebound or guarding.  Ext: 1+ edema on the right, 2+ on the left, hands particularly his fingers erythematous slightly warm-thickened yellow nail on right thumb Skin: warm, dry    Assessment and Plan   # Left shoulder pain S: On December 12 patient fell and hurt his shoulder-constant left shoulder pain since that time.  Tylenol  has not been beneficial. Around 11 am he was standing up quickly and foot was asleep- didn't realize it until he stood up when he went to walk feet got tangled and 12- broke a table and head laceration- called 911- luckily head and neck CT reassring but has bene having left shoulder pain since that time.  -he has been complaining of neck and shoulder pain since the fall in december A/P: With ongoing shoulder pain after prior fall-recommended x-ray today to rule out fracture and to evaluate for arthritis.  Discussed possible referral to sports medicine or physical therapy-has positive empty can, Hawkins, Neer test-he wants to decline this but is open to ruling out fracture -His fall was 11:00 in the morning and had not used Ambien  well over 12 hours-I do not think it contributed to the fall  #Dermatology  follow up- squamous cell removals recently.  - hands have been more red- reports helped with prior bacterial infection- thumb with onychomycosis  -He reports his hands got better when treated for onychomycosis for his nail in the past and wants to retrial-I think that is reasonable and this was sent in-update liver function is baseline today  # Imaging review from hospital CT chest abdomen pelvis  2. 20 mm solid, lobular nodule in the left lower lobe. 12 mm subsolid nodule in the right lower lobe. Follow-up as clinically indicated for patient age and comorbidities. 3.  Unchanged size of a 4.5 cm infrarenal aortic aneurysm with increased mural thrombus. Follow-up as clinically indicated for patient age and comorbidities. - Discussed possible repeat imaging of nodular areas in the lung-he declines-aware of possible lung cancer risk   # Diabetes- new diagnosis based on labs 05/04/22 and 01/11/23 S: Medication:Diet controlled-has declined diabetes education Lab Results  Component Value Date   HGBA1C 6.6 (H) 01/11/2023   HGBA1C 6.5 05/04/2022   HGBA1C 6.3 06/24/2021  A/P: Has been diet controlled-continue to monitor with labs today   # Atrial fibrillation-follows with Dr. Court of cardiology S: Rate controlled with no Rx -Trial off metoprolol  12.5 mg extended release December 2023 to see if helps with Raynaud's and if we can increase amlodipine  and we  were able to do so Anticoagulated with Coumadin -follows in our clinic A/P: appropriately anticoagulated and rate controlled- continue current medicine.  With her benefit I did ask him to reach out to Coumadin  clinic as will likely need closer monitoring  #Aortic aneurysm-continues to have at least yearly checks most recently October 2023 with annual recheck planned  -Thankfully stable on CT chest abdomen pelvis December 2024.  He can cancel the ultrasound that was previously ordered-previously missed on Sunday   #hypertension #Venous insufficiency S: medication: Amlodipine  2.5 mg--> 5 mg for help with Raynaud's, triamterene  hydrochlorothiazide 37.5-25 mg, Lasix  20 mg as needed for edema- has not taken lately  BP Readings from Last 3 Encounters:  05/25/23 136/84  05/24/23 130/82  05/03/23 121/74  A/P: Blood pressure is well-controlled but is having some more edema-he wants to retry Lasix  and may have some at home but I also refilled this for him as she is consistent with amlodipine  5 mg which likely contribute to edema as well as his triamterene  hydrochlorothiazide-we opted to continue current  medications  Recommended follow up: Return in about 2 months (around 07/23/2023) for followup or sooner if needed.Schedule b4 you leave. Future Appointments  Date Time Provider Department Center  06/09/2023  8:00 AM [facility]  [facility]IC [facility]  10/01/2023  3:15 PM [facility] [facility] None    Lab/Order associations:   ICD-10-CM   1. Controlled type 2 diabetes mellitus without complication, without long-term current use of insulin (HCC)  E11.9 Hemoglobin A1c    Comprehensive metabolic panel    2. Lower extremity edema  R60.0 furosemide  (LASIX ) 20 MG tablet    3. Chronic left shoulder pain  M25.512 DG Shoulder Left   G89.29     4. Atrial fibrillation, unspecified type (HCC)  I48.91     5. Abdominal aortic aneurysm (AAA) without rupture, unspecified part (HCC)  I71.40     6. Essential hypertension  I10       Meds ordered this encounter  Medications   terbinafine  (LAMISIL ) 250 MG tablet    Sig: Take 1 tablet (250 mg total) by mouth daily. For toenail fungus/onychomycosis    Dispense:  45 tablet    Refill:  0   furosemide  (LASIX ) 20 MG tablet    Sig: Take 1 tablet (20 mg total) by mouth daily as needed for edema or fluid.    Dispense:  90 tablet    Refill:  1    Return precautions advised.  Garnette Lukes, MD

## 2023-05-26 ENCOUNTER — Encounter: Payer: Self-pay | Admitting: Family Medicine

## 2023-05-26 LAB — COMPREHENSIVE METABOLIC PANEL
ALT: 12 U/L (ref 0–53)
AST: 22 U/L (ref 0–37)
Albumin: 4.2 g/dL (ref 3.5–5.2)
Alkaline Phosphatase: 58 U/L (ref 39–117)
BUN: 28 mg/dL — ABNORMAL HIGH (ref 6–23)
CO2: 28 meq/L (ref 19–32)
Calcium: 9.3 mg/dL (ref 8.4–10.5)
Chloride: 101 meq/L (ref 96–112)
Creatinine, Ser: 1.33 mg/dL (ref 0.40–1.50)
GFR: 45.4 mL/min — ABNORMAL LOW (ref >60.00)
Glucose, Bld: 95 mg/dL (ref 70–99)
Potassium: 3.9 meq/L (ref 3.5–5.1)
Sodium: 140 meq/L (ref 135–145)
Total Bilirubin: 0.6 mg/dL (ref 0.2–1.2)
Total Protein: 6.8 g/dL (ref 6.0–8.3)

## 2023-05-26 LAB — HEMOGLOBIN A1C: Hgb A1c MFr Bld: 6.6 % — ABNORMAL HIGH (ref 4.6–6.5)

## 2023-05-31 ENCOUNTER — Ambulatory Visit: Payer: Self-pay | Admitting: Family Medicine

## 2023-05-31 ENCOUNTER — Other Ambulatory Visit: Payer: Self-pay | Admitting: Family Medicine

## 2023-05-31 NOTE — Telephone Encounter (Signed)
 Please see note below: pt would like a new script as previous was discarded by mistake. Routing to provider for review.  Copied from CRM [id number]. Topic: Clinical - Medication Refill >> May 31, 2023  4:44 PM Corin V wrote: Most Recent Primary Care Visit:  Provider: Almira Jaeger  Department: [facility]  Visit Type: OFFICE VISIT  Date: 05/25/2023  Medication: fluocinonide  (LIDEX ) 0.05 % external solution [[id number]]- Bottle was accidentally tossed in trash and [facility] not [facility] refills. They are okay to pay out of picket if a prescription can be called in   Has the patient contacted their pharmacy? Yes (Agent: If no, request that the patient contact the pharmacy for the refill. If patient does not wish to contact the pharmacy document the reason why and proceed with request.) (Agent: If yes, when and what did the pharmacy advise?)  Is this the correct pharmacy for this prescription? Yes If no, delete pharmacy and type the correct one.  This is the patient's preferred pharmacy:  [id number] [place], [place] - [address] [place] [address] Phone: [phone] Fax: [phone]   Has the prescription been filled recently? Yes  Is the patient out of the medication? Yes  Has the patient been seen for an appointment in the last year OR does the patient have an upcoming appointment? Yes  Can we respond through MyChart? No  Agent: Please be advised that Rx refills may take up to 3 business days. We ask that you follow-up with your pharmacy.

## 2023-05-31 NOTE — Telephone Encounter (Signed)
 Copied from CRM [id number]. Topic: Clinical - Red Word Triage >> May 31, 2023  4:42 PM Corin V wrote: [facility] that prompted transfer to Nurse Triage: Patient's medication for his scalp accidentally was tossed out and he needs a refill. His scalp is burning.   Patient's daughter called to report that the patient's Lidex  that was recently filled was accidentally thrown out. She states that he is now experiencing a burning to his scalp and would like a new prescription sent to the pharmacy. She is hoping it could be sent tonight if possible.     Reason for Disposition  [1] Prescription refill request for NON-ESSENTIAL medicine (i.e., no harm to patient if med not taken) AND [2] triager unable to refill per department policy  Answer Assessment - Initial Assessment Questions 1. DRUG NAME: "What medicine do you need to have refilled?"     Fluocinonide  2. REFILLS REMAINING: "How many refills are remaining?" (Note: The label on the medicine or pill bottle will show how many refills are remaining. If there are no refills remaining, then a renewal may be needed.)     No 4. PRESCRIBING HCP: "Who prescribed it?" Reason: If prescribed by specialist, call should be referred to that group.     Dr. Arlene Ben 5. SYMPTOMS: "Do you have any symptoms?"     Scalp burning  Protocols used: Medication Refill and Renewal Call-A-AH

## 2023-06-01 NOTE — Telephone Encounter (Signed)
Requesting: FLUOCINONIDE 0.05% SOLUTION  Last Visit: 05/25/2023 Next Visit: Visit date not found Last Refill: 11/16/2022  Please Advise   Medication: fluocinonide (LIDEX) 0.05 % external solution [[id number]]- Bottle was accidentally tossed in trash and [facility] not [facility]. They are okay to pay out of picket if a prescription can be called in

## 2023-06-01 NOTE — Telephone Encounter (Signed)
Requesting: Lidex Last Visit: 05/25/2023 Next Visit: Visit date not found Last Refill: 11/16/2022  Please Advise

## 2023-06-01 NOTE — Telephone Encounter (Signed)
Was sent as Rx request-I completed this

## 2023-06-07 ENCOUNTER — Telehealth: Payer: Self-pay

## 2023-06-07 ENCOUNTER — Ambulatory Visit: Payer: Medicare Other | Admitting: Family

## 2023-06-07 ENCOUNTER — Encounter: Payer: Self-pay | Admitting: Family

## 2023-06-07 ENCOUNTER — Telehealth (INDEPENDENT_AMBULATORY_CARE_PROVIDER_SITE_OTHER): Payer: Self-pay | Admitting: Otolaryngology

## 2023-06-07 VITALS — BP 119/66 | HR 66 | Temp 97.5°F | Ht 71.0 in | Wt 168.8 lb

## 2023-06-07 DIAGNOSIS — H9202 Otalgia, left ear: Secondary | ICD-10-CM

## 2023-06-07 NOTE — Telephone Encounter (Signed)
Patient daughter called and stated that the patient has severe ear pain and was up all night. I let the daughter know we did not have anything available and i would let you know

## 2023-06-07 NOTE — Progress Notes (Signed)
Patient ID: William Mahoney, male    DOB: 03/01/1926, 89 y.o.   MRN: [id number]  Chief Complaint  Patient presents with   Ear Pain    Intermittent left ear pain for 1 month saw ENT last night was worse       Discussed the use of AI scribe software for clinical note transcription with the patient, who gave verbal consent to proceed.  History of Present Illness   William Mahoney is a 89 year old male who presents with persistent left ear pain following tympanic membrane patch placement. He has been experiencing persistent ear pain for several months, which worsened last night, causing insomnia. The pain is described as a dull ache that occasionally intensifies, particularly at night, and feels like pressure and fullness in the ear, extending towards the eye and upper jaw. Heat application provides some relief. He recently consulted an ENT specialist who placed a patch over a tympanic membrane perforation about 2 weeks ago. Prior to this he had been on long term antibiotics for persistent ear infection in same ear.  Despite these interventions, the pain persists. He does not recall any significant drainage from the ear, although the perforation was noted to have been present for some time. He takes extra strength Tylenol for pain relief, sometimes one or two tablets, but it has not been effective, especially at night. He also takes a sleeping pill at night, which has not alleviated the pain-induced insomnia. He is unable to take ibuprofen or Aleve due to being on warfarin. No tenderness when touching the ear or jaw, and no pain when opening or closing the mouth. No swelling compared to the other side and no tenderness in the lymph nodes.     Assessment & Plan:     Left Ear Pain - No signs of infection or inflammation, small area of dark gray noted behind TM, indicative of mild effusion. Pain described as a deep ache, with a sensation of fullness and pressure. Pain worsens at night and is partially  relieved by heat. -Continue heat application as needed for pain relief. -Take Extra Strength Tylenol 1-2 tablets every 6 hours as needed for pain, with an additional dose at bedtime if necessary. -Attempt to sleep propped up to reduce pressure in the ear. -Follow up with ENT specialist, Dr. Allena Katz, as soon as possible for further evaluation.      Subjective:    Outpatient Medications Prior to Visit  Medication Sig Dispense Refill   albuterol (VENTOLIN HFA) 108 (90 Base) MCG/ACT inhaler Inhale 2 puffs into the lungs every 6 (six) hours as needed for wheezing or shortness of breath. 1 each 2   allopurinol (ZYLOPRIM) 300 MG tablet TAKE ONE-HALF TABLET BY MOUTH IN THE EVENING 45 tablet 3   amLODipine (NORVASC) 5 MG tablet Take 1 tablet (5 mg total) by mouth daily. For blood pressure and possible Raynauds 90 tablet 3   fluocinonide (LIDEX) 0.05 % external solution APPLY 1 ML TO AFFECTED AREAS OF SCALP AND CHEST NIGHTLY 60 mL 3   fluticasone (FLONASE) 50 MCG/ACT nasal spray Place 2 sprays into both nostrils daily. 48 g 3   furosemide (LASIX) 20 MG tablet Take 1 tablet (20 mg total) by mouth daily as needed for edema or fluid. 90 tablet 1   Multiple Vitamin (MULTIVITAMIN) tablet Take 1 tablet by mouth daily.     tamsulosin (FLOMAX) 0.4 MG CAPS capsule Take 1 capsule (0.4 mg total) by mouth daily. (Patient taking differently: Take 0.4 mg  by mouth daily as needed (Prostate).) 90 capsule 3   terbinafine (LAMISIL) 250 MG tablet Take 1 tablet (250 mg total) by mouth daily. For toenail fungus/onychomycosis 45 tablet 0   triamterene-hydrochlorothiazide (MAXZIDE-25) 37.5-25 MG tablet TAKE 1/2 TABLET BY MOUTH DAILY (Patient taking differently: Take 0.5 tablets by mouth daily as needed. 1 tablet every day and on Wednesdays - takes 1.5 tablet.) 45 tablet 1   warfarin (COUMADIN) 2.5 MG tablet TAKE 1 TABLET BY MOUTH DAILY OR AS DIRECTED BY ANTICOAGULATION CLINIC 110 tablet 1   zolpidem (AMBIEN) 5 MG tablet TAKE 1  TABLET BY MOUTH EVERY DAY AT BEDTIME AS NEEDED FOR SLEEP 30 tablet 5   No facility-administered medications prior to visit.   Past Medical History:  Diagnosis Date   BPH (benign prostatic hypertrophy)    Chronic atrial fibrillation [facility])    cardiologist--   dr berry   COPD (chronic obstructive pulmonary disease) (HCC)    Diverticulosis of colon    MODERATE LEFT SIDE   Epididymal cyst    w/ epididymalitis   Full dentures    Gout    Hand foot syndrome    secondary to chemotherapy (Xeloda)   cold hands/feet   Hearing loss    History of alcohol abuse    quit drinking in the mid 80's   History of cirrhosis of liver    alcoholic--  hx alcohol abuse -- quit drinking 1980's   History of gout    History of melanoma excision    2013--  left ear lobe and back of hand   History of pancreatitis    2008   History of rectal cancer oncologist-  dr Truett Perna--  no recurrence   dx Sept 2009--  Stage II (T3N0)  s/p  sigmoid colectomy & low anterior resection 04-18-2008  and chemoradiation 2010   History of shingles 07/27/2010   History of squamous cell carcinoma excision    left lower leg   Hyperlipidemia    Hypertension    Loose stools    DUE TO ANTIBIOTICS   Macular degeneration    not sure which eye   OA (osteoarthritis)    RBBB (right bundle branch block)    Renal lesion    chronic-- left side   Sciatica of right side    Urge urinary incontinence    intermittant   Past Surgical History:  Procedure Laterality Date   CARDIAC CATHETERIZATION  2001  approx.  in [place]   CATARACT EXTRACTION W/ INTRAOCULAR LENS  IMPLANT, BILATERAL     COLONOSCOPY  last one 06-27-2012   EXPLORATORY LAPAROTOMY/  LOW ANTERIOR RESECTION/  SIGMOID COLECTOMY  04-18-2008   DR INGRAM   INCISION AND DRAINAGE ABSCESS N/A 11/02/2014   Procedure: INCISION AND DRAINAGE OF SCROTUM;  Surgeon: Crist Fat, MD;  Location: [facility];  Service: Urology;  Laterality: N/A;   INGUINAL HERNIA REPAIR  Right 03/23/2014   Procedure: HERNIA REPAIR INGUINAL ADULT OPEN REPAIR RIGHT INGUINAL HERNIA REPAIR;  Surgeon: Claud Kelp, MD;  Location: [facility] OR;  Service: General;  Laterality: Right;   INSERTION OF MESH Right 03/23/2014   Procedure: INSERTION OF MESH;  Surgeon: Claud Kelp, MD;  Location: [facility] OR;  Service: General;  Laterality: Right;   LAPAROSCOPIC CHOLECYSTECTOMY  1990's   MASS EXCISION N/A 11/02/2014   Procedure: EXCISION OF EPIDIDYMAL CYST;  Surgeon: Crist Fat, MD;  Location: [facility];  Service: Urology;  Laterality: N/A;   TONSILLECTOMY  as child  TRANSTHORACIC ECHOCARDIOGRAM  05-11-2008   pseudonormal LV filling pattern,  ef 60-65%/  mild to moderate MV calcification no stenosis w/ mild to moderate regurg./  mild LAE and RAE/  mild TR   No Known Allergies    Objective:    Physical Exam Vitals and nursing note reviewed.  Constitutional:      General: He is not in acute distress.    Appearance: Normal appearance.  HENT:     Head: Normocephalic.     Right Ear: Tympanic membrane and ear canal normal.     Left Ear: Ear canal normal. No decreased hearing noted. Tenderness present. A middle ear effusion (dark gray noted, no erythema or bulging, paper patch appears to be intact) is present. Tympanic membrane is not injected, erythematous or bulging.  Cardiovascular:     Rate and Rhythm: Normal rate and regular rhythm.  Pulmonary:     Effort: Pulmonary effort is normal.     Breath sounds: Normal breath sounds.  Musculoskeletal:        General: Normal range of motion.     Cervical back: Normal range of motion.  Skin:    General: Skin is warm and dry.  Neurological:     Mental Status: He is alert and oriented to person, place, and time.  Psychiatric:        Mood and Affect: Mood normal.    BP 119/66 (BP Location: Right Arm, Patient Position: Sitting, Cuff Size: Normal)   Pulse 66   Temp (!) 97.5 F (36.4 C) (Oral)   Ht 5\' 11"  (1.803 m)   Wt 168  lb 12.8 oz (76.6 kg)   SpO2 97%   BMI 23.54 kg/m  Wt Readings from Last 3 Encounters:  06/07/23 168 lb 12.8 oz (76.6 kg)  05/25/23 165 lb 6.4 oz (75 kg)  05/24/23 160 lb (72.6 kg)       Dulce Sellar, NP

## 2023-06-07 NOTE — Telephone Encounter (Signed)
Contacted pt's daughter, William Mahoney, and advised coumadin clinic nurse will not be in until after 9 am on 2/17 when pt has an apt at 8am for coumadin clinic. Requested Rs apt for later that morning. She reports since there is supposed to be bad weather on 2/19 she would like to RS for next week. RS apt for 2/26. [name] also reports pt is having a lot of ear pain. He saw a provider today and also has a msg in with ENT. Source of ear pain has not been discovered per William Mahoney. Advised if any changes before apt next week to contact the coumadin clinic. William Mahoney verbalized understanding.  Apt RS for 2/26.

## 2023-06-09 ENCOUNTER — Ambulatory Visit: Payer: Medicare Other

## 2023-06-10 ENCOUNTER — Encounter: Payer: Self-pay | Admitting: Family Medicine

## 2023-06-11 ENCOUNTER — Other Ambulatory Visit: Payer: Self-pay

## 2023-06-11 DIAGNOSIS — G8929 Other chronic pain: Secondary | ICD-10-CM

## 2023-06-16 ENCOUNTER — Ambulatory Visit: Payer: Medicare Other

## 2023-06-17 ENCOUNTER — Telehealth: Payer: Self-pay

## 2023-06-17 NOTE — Telephone Encounter (Signed)
 Pt NS coumadin clinic apt yesterday.  LVM with pt's daughter, Gaylyn Rong.

## 2023-06-17 NOTE — Progress Notes (Unsigned)
 [facility] [address] [place] [address] Phone: [phone]   Assessment and Plan:    1. Chronic left shoulder pain 2. Primary osteoarthritis of left shoulder  -Chronic with exacerbation, initial visit - Flare of left shoulder pain after fall in December 2024 with overall improvement, however continued daily pain and decreased range of motion - X-ray obtained in clinic.  My interpretation: No acute fracture or dislocation.  Cortical changes along humeral head and clavicle that could potentially represent subacute to chronic fracture with patient's fall in December 2024.  Will await radiology review.  High riding humeral head with suspicion for subacute to chronic rotator cuff tear.  Glenohumeral and AC joint degenerative changes - Start Tylenol 500mg  tablets 2-3 times a day for day-to-day pain relief - May use topical Voltaren gel over areas of pain once daily - Start HEP for shoulder - Do not recommend NSAIDs or prednisone due to age and comorbidities  15 additional minutes spent for educating Therapeutic Home Exercise Program.  This included exercises focusing on stretching, strengthening, with focus on eccentric aspects.   Long term goals include an improvement in range of motion, strength, endurance as well as avoiding reinjury. Patient's frequency would include in 1-2 times a day, 3-5 times a week for a duration of 6-12 weeks. Proper technique shown and discussed handout in great detail with ATC.  All questions were discussed and answered.    Pertinent previous records reviewed include none  Follow Up: 3 weeks for reevaluation.  If no improvement or worsening of symptoms, could consider CSI subacromial versus intra-articular based on physical exam.  Would review radiology report of shoulder x-ray   Subjective:   I, William Mahoney, am serving as a Neurosurgeon for Doctor William Mahoney  Chief Complaint: left shoulder pain    HPI:   06/18/2023 Patient is a 89 year old male with left shoulder pain. Patient states aching since he fell a couple of months a go. Decreased ROM. Intermittent tylenol for the pain. No numbness and tingling. No decrease in grip strength. Pain radiates to the neck and ear.    Relevant Historical Information: PAD, hypertension, atrial fibrillation, DM type II, CKD  Additional pertinent review of systems negative.   Current Outpatient Medications:    albuterol (VENTOLIN HFA) 108 (90 Base) MCG/ACT inhaler, Inhale 2 puffs into the lungs every 6 (six) hours as needed for wheezing or shortness of breath., Disp: 1 each, Rfl: 2   allopurinol (ZYLOPRIM) 300 MG tablet, TAKE ONE-HALF TABLET BY MOUTH IN THE EVENING, Disp: 45 tablet, Rfl: 3   amLODipine (NORVASC) 5 MG tablet, Take 1 tablet (5 mg total) by mouth daily. For blood pressure and possible Raynauds, Disp: 90 tablet, Rfl: 3   fluocinonide (LIDEX) 0.05 % external solution, APPLY 1 ML TO AFFECTED AREAS OF SCALP AND CHEST NIGHTLY, Disp: 60 mL, Rfl: 3   fluticasone (FLONASE) 50 MCG/ACT nasal spray, Place 2 sprays into both nostrils daily., Disp: 48 g, Rfl: 3   furosemide (LASIX) 20 MG tablet, Take 1 tablet (20 mg total) by mouth daily as needed for edema or fluid., Disp: 90 tablet, Rfl: 1   Multiple Vitamin (MULTIVITAMIN) tablet, Take 1 tablet by mouth daily., Disp: , Rfl:    tamsulosin (FLOMAX) 0.4 MG CAPS capsule, Take 1 capsule (0.4 mg total) by mouth daily. (Patient taking differently: Take 0.4 mg by mouth daily as needed (Prostate).), Disp: 90 capsule, Rfl: 3   terbinafine (LAMISIL) 250  MG tablet, Take 1 tablet (250 mg total) by mouth daily. For toenail fungus/onychomycosis, Disp: 45 tablet, Rfl: 0   triamterene-hydrochlorothiazide (MAXZIDE-25) 37.5-25 MG tablet, TAKE 1/2 TABLET BY MOUTH DAILY (Patient taking differently: Take 0.5 tablets by mouth daily as needed. 1 tablet every day and on Wednesdays - takes 1.5 tablet.), Disp: 45 tablet, Rfl:  1   warfarin (COUMADIN) 2.5 MG tablet, TAKE 1 TABLET BY MOUTH DAILY OR AS DIRECTED BY ANTICOAGULATION CLINIC, Disp: 110 tablet, Rfl: 1   zolpidem (AMBIEN) 5 MG tablet, TAKE 1 TABLET BY MOUTH EVERY DAY AT BEDTIME AS NEEDED FOR SLEEP, Disp: 30 tablet, Rfl: 5   Objective:     Vitals:   06/18/23 1403  BP: 102/76  Weight: 161 lb (73 kg)  Height: 5\' 11"  (1.803 m)      Body mass index is 22.45 kg/m.    Physical Exam:    Gen: Appears well, nad, nontoxic and pleasant Neuro:sensation intact, strength is 5/5 with df/pf/inv/ev, muscle tone wnl Skin: no suspicious lesion or defmority Psych: A&O, appropriate mood and affect  Left shoulder:  No deformity, swelling or relative muscle wasting No scapular winging FF 160, abd 140, int 20, ext 70 NTTP over the [place], clavicle, ac, coracoid, biceps groove, humerus, deltoid, trapezius, cervical spine neg ant drawer, sulcus sign, apprehension Negative Spurling's test bilat FROM of neck    Electronically signed by:  [facility] 2:36 PM 06/18/23

## 2023-06-18 ENCOUNTER — Ambulatory Visit: Payer: Medicare Other | Admitting: Sports Medicine

## 2023-06-18 VITALS — BP 102/76 | Ht 71.0 in | Wt 161.0 lb

## 2023-06-18 DIAGNOSIS — G8929 Other chronic pain: Secondary | ICD-10-CM | POA: Diagnosis not present

## 2023-06-18 DIAGNOSIS — M25512 Pain in left shoulder: Secondary | ICD-10-CM

## 2023-06-18 DIAGNOSIS — M19012 Primary osteoarthritis, left shoulder: Secondary | ICD-10-CM

## 2023-06-18 NOTE — Patient Instructions (Signed)
 Shoulder HEP  Voltaren gel over areas of pain  Tylenol 500 mg 2-3 times a day for pain relief  3 week follow up

## 2023-06-22 NOTE — Telephone Encounter (Signed)
 Contacted pt and his daughter and [facility] coumadin clinic apt for next week.

## 2023-06-30 ENCOUNTER — Ambulatory Visit (INDEPENDENT_AMBULATORY_CARE_PROVIDER_SITE_OTHER)

## 2023-06-30 DIAGNOSIS — Z7901 Long term (current) use of anticoagulants: Secondary | ICD-10-CM | POA: Diagnosis not present

## 2023-06-30 LAB — POCT INR: INR: 1.7 — AB (ref 2.0–3.0)

## 2023-06-30 NOTE — Progress Notes (Signed)
 Pt denies any changes. Pt was on the lower side of his range at last apt and subtherapeutic for multiple INR results before that. Pt is again subtherapeutic today. Due to this a change to weekly dosing has been made. Pt will f/u in 3 weeks for next INR check. Increase dose today to take 2 tablets and then change weekly dose to take 1 tablet daily except take 1 1/2 tablets on Wednesday and Saturday. Recheck in 3 weeks.

## 2023-06-30 NOTE — Progress Notes (Signed)
 I have reviewed and agree with note, evaluation, plan.   Tana Conch, MD

## 2023-06-30 NOTE — Patient Instructions (Addendum)
 Pre visit review using our clinic review tool, if applicable. No additional management support is needed unless otherwise documented below in the visit note.  Increase dose today to take 2 tablets and then change weekly dose to take 1 tablet daily except take 1 1/2 tablets on Wednesday and Saturday. Recheck in 3 weeks.

## 2023-07-06 ENCOUNTER — Other Ambulatory Visit: Payer: Self-pay | Admitting: Family Medicine

## 2023-07-07 NOTE — Progress Notes (Unsigned)
    Aleen Sells D.[facility] [address] [place] [address] Phone: [phone]   Assessment and Plan:     There are no diagnoses linked to this encounter.  ***   Pertinent previous records reviewed include ***    Follow Up: ***     Subjective:   I, Hiram Mciver, am serving as a Neurosurgeon for Doctor [name]   Chief Complaint: left shoulder pain    HPI:    06/18/2023 Patient is a 89 year old male with left shoulder pain. Patient states aching since he fell a couple of months a go. Decreased ROM. Intermittent tylenol for the pain. No numbness and tingling. No decrease in grip strength. Pain radiates to the neck and ear.   07/08/2023 Patient states    Relevant Historical Information: PAD, hypertension, atrial fibrillation, DM type II, CKD Additional pertinent review of systems negative.   Current Outpatient Medications:    albuterol (VENTOLIN HFA) 108 (90 Base) MCG/ACT inhaler, Inhale 2 puffs into the lungs every 6 (six) hours as needed for wheezing or shortness of breath., Disp: 1 each, Rfl: 2   allopurinol (ZYLOPRIM) 300 MG tablet, TAKE ONE-HALF TABLET BY MOUTH IN THE EVENING, Disp: 45 tablet, Rfl: 3   amLODipine (NORVASC) 5 MG tablet, Take 1 tablet (5 mg total) by mouth daily. For blood pressure and possible Raynauds, Disp: 90 tablet, Rfl: 3   fluocinonide (LIDEX) 0.05 % external solution, APPLY 1 ML TO AFFECTED AREAS OF SCALP AND CHEST NIGHTLY, Disp: 60 mL, Rfl: 3   fluticasone (FLONASE) 50 MCG/ACT nasal spray, Place 2 sprays into both nostrils daily., Disp: 48 g, Rfl: 3   furosemide (LASIX) 20 MG tablet, Take 1 tablet (20 mg total) by mouth daily as needed for edema or fluid., Disp: 90 tablet, Rfl: 1   Multiple Vitamin (MULTIVITAMIN) tablet, Take 1 tablet by mouth daily., Disp: , Rfl:    tamsulosin (FLOMAX) 0.4 MG CAPS capsule, Take 1 capsule (0.4 mg total) by mouth daily. (Patient taking differently: Take 0.4 mg by mouth  daily as needed (Prostate).), Disp: 90 capsule, Rfl: 3   terbinafine (LAMISIL) 250 MG tablet, TAKE 1 TABLET (250 MG TOTAL) BY MOUTH DAILY. FOR TOENAIL FUNGUS/ONYCHOMYCOSIS, Disp: 45 tablet, Rfl: 0   triamterene-hydrochlorothiazide (MAXZIDE-25) 37.5-25 MG tablet, TAKE 1/2 TABLET BY MOUTH DAILY (Patient taking differently: Take 0.5 tablets by mouth daily as needed. 1 tablet every day and on Wednesdays - takes 1.5 tablet.), Disp: 45 tablet, Rfl: 1   warfarin (COUMADIN) 2.5 MG tablet, TAKE 1 TABLET BY MOUTH DAILY OR AS DIRECTED BY ANTICOAGULATION CLINIC, Disp: 110 tablet, Rfl: 1   zolpidem (AMBIEN) 5 MG tablet, TAKE 1 TABLET BY MOUTH EVERY DAY AT BEDTIME AS NEEDED FOR SLEEP, Disp: 30 tablet, Rfl: 5   Objective:     There were no vitals filed for this visit.    There is no height or weight on file to calculate BMI.    Physical Exam:    ***   Electronically signed by:  Aleen Sells D[facility] 7:39 AM 07/07/23

## 2023-07-08 ENCOUNTER — Ambulatory Visit (INDEPENDENT_AMBULATORY_CARE_PROVIDER_SITE_OTHER): Payer: Medicare Other | Admitting: Sports Medicine

## 2023-07-08 VITALS — BP 108/70 | Ht 71.0 in | Wt 161.0 lb

## 2023-07-08 DIAGNOSIS — G8929 Other chronic pain: Secondary | ICD-10-CM

## 2023-07-08 DIAGNOSIS — M19012 Primary osteoarthritis, left shoulder: Secondary | ICD-10-CM | POA: Diagnosis not present

## 2023-07-08 DIAGNOSIS — M25512 Pain in left shoulder: Secondary | ICD-10-CM

## 2023-07-08 NOTE — Patient Instructions (Addendum)
 Shoulder HEP  Tylenol [phone] mg 2-3 times a day for pain relief  As needed follow up

## 2023-07-21 ENCOUNTER — Ambulatory Visit

## 2023-07-21 DIAGNOSIS — Z7901 Long term (current) use of anticoagulants: Secondary | ICD-10-CM | POA: Diagnosis not present

## 2023-07-21 LAB — POCT INR: INR: 1.7 — AB (ref 2.0–3.0)

## 2023-07-21 NOTE — Progress Notes (Signed)
 Pt reports he did not change dosing as advised at last visit. He said he forgot he was supposed to change Saturday dose from 1 tablet to 1 1/2 tablets. Due to this his INR is the same as last visit. Will boost dose today and then try new dosing and recheck in 3 weeks. Increase dose today to take 2 tablets and then change weekly dose to take 1 tablet daily except take 1 1/2 tablets on Wednesday and Saturday. Recheck in 3 weeks.

## 2023-07-21 NOTE — Patient Instructions (Addendum)
 Pre visit review using our clinic review tool, if applicable. No additional management support is needed unless otherwise documented below in the visit note.  Increase dose today to take 2 tablets and then change weekly dose to take 1 tablet daily except take 1 1/2 tablets on Wednesday and Saturday. Recheck in 3 weeks.

## 2023-07-28 ENCOUNTER — Encounter: Payer: Self-pay | Admitting: Family Medicine

## 2023-07-28 ENCOUNTER — Inpatient Hospital Stay (HOSPITAL_COMMUNITY)
Admission: EM | Admit: 2023-07-28 | Discharge: 2023-07-30 | DRG: 070 | Disposition: A | Discharge status: Home Health | Attending: Internal Medicine | Admitting: Internal Medicine

## 2023-07-28 ENCOUNTER — Other Ambulatory Visit: Payer: Self-pay

## 2023-07-28 ENCOUNTER — Telehealth: Payer: Self-pay | Admitting: *Deleted

## 2023-07-28 ENCOUNTER — Emergency Department (HOSPITAL_COMMUNITY)

## 2023-07-28 ENCOUNTER — Ambulatory Visit: Admitting: Family Medicine

## 2023-07-28 VITALS — BP 102/76 | HR 77 | Temp 97.3°F | Ht 71.0 in

## 2023-07-28 DIAGNOSIS — D72829 Elevated white blood cell count, unspecified: Secondary | ICD-10-CM | POA: Diagnosis present

## 2023-07-28 DIAGNOSIS — I4891 Unspecified atrial fibrillation: Secondary | ICD-10-CM | POA: Diagnosis present

## 2023-07-28 DIAGNOSIS — E1151 Type 2 diabetes mellitus with diabetic peripheral angiopathy without gangrene: Secondary | ICD-10-CM | POA: Diagnosis present

## 2023-07-28 DIAGNOSIS — B3749 Other urogenital candidiasis: Secondary | ICD-10-CM | POA: Diagnosis present

## 2023-07-28 DIAGNOSIS — R3915 Urgency of urination: Secondary | ICD-10-CM

## 2023-07-28 DIAGNOSIS — I739 Peripheral vascular disease, unspecified: Secondary | ICD-10-CM | POA: Diagnosis present

## 2023-07-28 DIAGNOSIS — Z85048 Personal history of other malignant neoplasm of rectum, rectosigmoid junction, and anus: Secondary | ICD-10-CM

## 2023-07-28 DIAGNOSIS — G9341 Metabolic encephalopathy: Principal | ICD-10-CM | POA: Diagnosis present

## 2023-07-28 DIAGNOSIS — R531 Weakness: Secondary | ICD-10-CM

## 2023-07-28 DIAGNOSIS — E785 Hyperlipidemia, unspecified: Secondary | ICD-10-CM | POA: Diagnosis present

## 2023-07-28 DIAGNOSIS — Z85820 Personal history of malignant melanoma of skin: Secondary | ICD-10-CM

## 2023-07-28 DIAGNOSIS — B351 Tinea unguium: Secondary | ICD-10-CM | POA: Diagnosis present

## 2023-07-28 DIAGNOSIS — Z823 Family history of stroke: Secondary | ICD-10-CM

## 2023-07-28 DIAGNOSIS — Z79899 Other long term (current) drug therapy: Secondary | ICD-10-CM

## 2023-07-28 DIAGNOSIS — Z9049 Acquired absence of other specified parts of digestive tract: Secondary | ICD-10-CM

## 2023-07-28 DIAGNOSIS — R5383 Other fatigue: Secondary | ICD-10-CM | POA: Diagnosis not present

## 2023-07-28 DIAGNOSIS — I10 Essential (primary) hypertension: Secondary | ICD-10-CM | POA: Diagnosis present

## 2023-07-28 DIAGNOSIS — N400 Enlarged prostate without lower urinary tract symptoms: Secondary | ICD-10-CM | POA: Diagnosis present

## 2023-07-28 DIAGNOSIS — R3911 Hesitancy of micturition: Secondary | ICD-10-CM

## 2023-07-28 DIAGNOSIS — Z807 Family history of other malignant neoplasms of lymphoid, hematopoietic and related tissues: Secondary | ICD-10-CM

## 2023-07-28 DIAGNOSIS — G9340 Encephalopathy, unspecified: Secondary | ICD-10-CM

## 2023-07-28 DIAGNOSIS — N390 Urinary tract infection, site not specified: Secondary | ICD-10-CM | POA: Diagnosis present

## 2023-07-28 DIAGNOSIS — E119 Type 2 diabetes mellitus without complications: Secondary | ICD-10-CM

## 2023-07-28 DIAGNOSIS — Z1152 Encounter for screening for COVID-19: Secondary | ICD-10-CM

## 2023-07-28 DIAGNOSIS — J449 Chronic obstructive pulmonary disease, unspecified: Secondary | ICD-10-CM | POA: Diagnosis present

## 2023-07-28 DIAGNOSIS — M109 Gout, unspecified: Secondary | ICD-10-CM | POA: Diagnosis present

## 2023-07-28 DIAGNOSIS — Z8249 Family history of ischemic heart disease and other diseases of the circulatory system: Secondary | ICD-10-CM

## 2023-07-28 DIAGNOSIS — J189 Pneumonia, unspecified organism: Secondary | ICD-10-CM | POA: Diagnosis present

## 2023-07-28 DIAGNOSIS — I129 Hypertensive chronic kidney disease with stage 1 through stage 4 chronic kidney disease, or unspecified chronic kidney disease: Secondary | ICD-10-CM | POA: Diagnosis present

## 2023-07-28 DIAGNOSIS — I482 Chronic atrial fibrillation, unspecified: Secondary | ICD-10-CM | POA: Diagnosis present

## 2023-07-28 DIAGNOSIS — Z7901 Long term (current) use of anticoagulants: Secondary | ICD-10-CM

## 2023-07-28 DIAGNOSIS — G4700 Insomnia, unspecified: Secondary | ICD-10-CM

## 2023-07-28 DIAGNOSIS — R509 Fever, unspecified: Secondary | ICD-10-CM | POA: Diagnosis present

## 2023-07-28 DIAGNOSIS — Z87891 Personal history of nicotine dependence: Secondary | ICD-10-CM

## 2023-07-28 DIAGNOSIS — N3001 Acute cystitis with hematuria: Principal | ICD-10-CM

## 2023-07-28 DIAGNOSIS — E1122 Type 2 diabetes mellitus with diabetic chronic kidney disease: Secondary | ICD-10-CM | POA: Diagnosis present

## 2023-07-28 DIAGNOSIS — N183 Chronic kidney disease, stage 3 unspecified: Secondary | ICD-10-CM | POA: Diagnosis present

## 2023-07-28 DIAGNOSIS — E876 Hypokalemia: Secondary | ICD-10-CM | POA: Diagnosis present

## 2023-07-28 LAB — I-STAT CG4 LACTIC ACID, ED: Lactic Acid, Venous: 1.3 mmol/L (ref 0.5–1.9)

## 2023-07-28 LAB — URINALYSIS, ROUTINE W REFLEX MICROSCOPIC
Bacteria, UA: NONE SEEN
Bilirubin Urine: NEGATIVE
Bilirubin Urine: NEGATIVE
Glucose, UA: NEGATIVE mg/dL
Hgb urine dipstick: NEGATIVE
Ketones, ur: NEGATIVE
Ketones, ur: NEGATIVE mg/dL
Leukocytes,Ua: NEGATIVE
Leukocytes,Ua: NEGATIVE
Nitrite: NEGATIVE
Nitrite: NEGATIVE
Protein, ur: 100 mg/dL — AB
Specific Gravity, Urine: 1.015 (ref 1.000–1.030)
Specific Gravity, Urine: 1.017 (ref 1.005–1.030)
Total Protein, Urine: 100 — AB
Urine Glucose: NEGATIVE
Urobilinogen, UA: 1 (ref 0.0–1.0)
pH: 7 (ref 5.0–8.0)
pH: 7 (ref 5.0–8.0)

## 2023-07-28 LAB — COMPREHENSIVE METABOLIC PANEL WITH GFR
ALT: 15 U/L (ref 0–53)
ALT: 17 U/L (ref 0–44)
AST: 26 U/L (ref 0–37)
AST: 26 U/L (ref 15–41)
Albumin: 4.4 g/dL (ref 3.5–5.2)
Albumin: 3.3 g/dL — ABNORMAL LOW (ref 3.5–5.0)
Alkaline Phosphatase: 71 U/L (ref 39–117)
Alkaline Phosphatase: 57 U/L (ref 38–126)
Anion gap: 11 (ref 5–15)
BUN: 32 mg/dL — ABNORMAL HIGH (ref 6–23)
BUN: 31 mg/dL — ABNORMAL HIGH (ref 8–23)
CO2: 33 meq/L — ABNORMAL HIGH (ref 19–32)
CO2: 28 mmol/L (ref 22–32)
Calcium: 9.8 mg/dL (ref 8.4–10.5)
Calcium: 8.8 mg/dL — ABNORMAL LOW (ref 8.9–10.3)
Chloride: 95 meq/L — ABNORMAL LOW (ref 96–112)
Chloride: 97 mmol/L — ABNORMAL LOW (ref 98–111)
Creatinine, Ser: 1.34 mg/dL (ref 0.40–1.50)
Creatinine, Ser: 1.27 mg/dL — ABNORMAL HIGH (ref 0.61–1.24)
GFR, Estimated: 52 mL/min — ABNORMAL LOW (ref >60)
GFR: 44.94 mL/min — ABNORMAL LOW (ref >60.00)
Glucose, Bld: 148 mg/dL — ABNORMAL HIGH (ref 70–99)
Glucose, Bld: 124 mg/dL — ABNORMAL HIGH (ref 70–99)
Potassium: 3.5 meq/L (ref 3.5–5.1)
Potassium: 3 mmol/L — ABNORMAL LOW (ref 3.5–5.1)
Sodium: 138 meq/L (ref 135–145)
Sodium: 136 mmol/L (ref 135–145)
Total Bilirubin: 2 mg/dL — ABNORMAL HIGH (ref 0.2–1.2)
Total Bilirubin: 1.7 mg/dL — ABNORMAL HIGH (ref 0.0–1.2)
Total Protein: 7.2 g/dL (ref 6.0–8.3)
Total Protein: 6.3 g/dL — ABNORMAL LOW (ref 6.5–8.1)

## 2023-07-28 LAB — CBC WITH DIFFERENTIAL/PLATELET
Abs Immature Granulocytes: 0.2 10*3/uL — ABNORMAL HIGH (ref 0.00–0.07)
Basophils Absolute: 0 10*3/uL (ref 0.0–0.1)
Basophils Absolute: 0 10*3/uL (ref 0.0–0.1)
Basophils Relative: 0.1 % (ref 0.0–3.0)
Basophils Relative: 0 %
Eosinophils Absolute: 0 10*3/uL (ref 0.0–0.7)
Eosinophils Absolute: 0.5 10*3/uL (ref 0.0–0.5)
Eosinophils Relative: 0 % (ref 0.0–5.0)
Eosinophils Relative: 3 %
HCT: 46 % (ref 39.0–52.0)
HCT: 42.9 % (ref 39.0–52.0)
Hemoglobin: 15.2 g/dL (ref 13.0–17.0)
Hemoglobin: 14 g/dL (ref 13.0–17.0)
Immature Granulocytes: 1 %
Lymphocytes Relative: 4.1 % — ABNORMAL LOW (ref 12.0–46.0)
Lymphocytes Relative: 4 %
Lymphs Abs: 0.8 10*3/uL (ref 0.7–4.0)
Lymphs Abs: 0.8 10*3/uL (ref 0.7–4.0)
MCH: 30.6 pg (ref 26.0–34.0)
MCHC: 33.1 g/dL (ref 30.0–36.0)
MCHC: 32.6 g/dL (ref 30.0–36.0)
MCV: 92.9 fl (ref 78.0–100.0)
MCV: 93.9 fL (ref 80.0–100.0)
Monocytes Absolute: 1.4 10*3/uL — ABNORMAL HIGH (ref 0.1–1.0)
Monocytes Absolute: 1.4 10*3/uL — ABNORMAL HIGH (ref 0.1–1.0)
Monocytes Relative: 7.8 % (ref 3.0–12.0)
Monocytes Relative: 8 %
Neutro Abs: 16 10*3/uL — ABNORMAL HIGH (ref 1.4–7.7)
Neutro Abs: 15.2 10*3/uL — ABNORMAL HIGH (ref 1.7–7.7)
Neutrophils Relative %: 88 % — ABNORMAL HIGH (ref 43.0–77.0)
Neutrophils Relative %: 84 %
Platelets: 145 10*3/uL — ABNORMAL LOW (ref 150.0–400.0)
Platelets: 129 10*3/uL — ABNORMAL LOW (ref 150–400)
RBC: 4.95 Mil/uL (ref 4.22–5.81)
RBC: 4.57 MIL/uL (ref 4.22–5.81)
RDW: 14.9 % (ref 11.5–15.5)
RDW: 14.6 % (ref 11.5–15.5)
WBC: 18.2 10*3/uL (ref 4.0–10.5)
WBC: 18.2 10*3/uL — ABNORMAL HIGH (ref 4.0–10.5)
nRBC: 0 % (ref 0.0–0.2)

## 2023-07-28 LAB — PROTIME-INR
INR: 2.2 — ABNORMAL HIGH (ref 0.8–1.2)
Prothrombin Time: 25.1 s — ABNORMAL HIGH (ref 11.4–15.2)

## 2023-07-28 LAB — POC COVID19 BINAXNOW: SARS Coronavirus 2 Ag: NEGATIVE

## 2023-07-28 LAB — CBG MONITORING, ED: Glucose-Capillary: 119 mg/dL — ABNORMAL HIGH (ref 70–99)

## 2023-07-28 LAB — MAGNESIUM: Magnesium: 2 mg/dL (ref 1.7–2.4)

## 2023-07-28 LAB — POCT INFLUENZA A/B
Influenza A, POC: NEGATIVE
Influenza B, POC: NEGATIVE

## 2023-07-28 MED ORDER — POTASSIUM CHLORIDE CRYS ER 20 MEQ PO TBCR
40.0000 meq | EXTENDED_RELEASE_TABLET | Freq: Once | ORAL | Status: AC
  Administered 2023-07-29: 40 meq via ORAL
  Filled 2023-07-28: qty 2

## 2023-07-28 MED ORDER — CEPHALEXIN 500 MG PO CAPS
500.0000 mg | ORAL_CAPSULE | Freq: Three times a day (TID) | ORAL | 0 refills | Status: DC
Start: 2023-07-28 — End: 2023-08-04

## 2023-07-28 MED ORDER — KETOCONAZOLE 2 % EX CREA: 1.0000 | TOPICAL_CREAM | Freq: Two times a day (BID) | CUTANEOUS | 1 refills | Status: AC

## 2023-07-28 MED ORDER — SODIUM CHLORIDE 0.9 % IV SOLN
2.0000 g | Freq: Once | INTRAVENOUS | Status: AC
  Administered 2023-07-28: 2 g via INTRAVENOUS
  Filled 2023-07-28: qty 20

## 2023-07-28 NOTE — Telephone Encounter (Signed)
 Clydie Braun form [place] lab call with Critical lab  WBC 18.2 Please advise  Verbal results given to Dr Durene Cal, phone message send

## 2023-07-28 NOTE — Telephone Encounter (Signed)
 I reviewed the notes from today. Elderly male with malaise and urinary sxs: started on keflex. WBC is reported at 18.2  Please call patient: IF drinking normally, normal cognitive state, and no fever, can start abx and monitor for 24 hours to await other labs and urine tests. However, if not feeling well or having fevers, I recommend ER visit for evaluation for infection and possible IV abx.

## 2023-07-28 NOTE — ED Provider Notes (Incomplete)
 [facility] EMER[facility]ENCY DEPARTMENT AT [facility] Provider Note   CSN: [id number] Arrival date & time: 07/28/23  2017     History {Add pertinent medical, surgical, social history, OB history to HPI:1} Chief Complaint  Patient presents with  . Weakness  . Altered Mental Status    William Mahoney is a 89 y.o. male history of COPD, inguinal hernia, AAA, diabetes, PAD, hypertension, A-fib on warfarin presented for 2 days of altered mental status.  Patient and daughter states that patient will occasionally say every 2 hours that he feels off and starts to "zone out."Patient is not had any fevers but went to his primary care and was told to come to the ER for further evaluation as his urine there showed a UTI.  Patient had 1 dose of Keflex earlier today and began feeling more altered and so his 2 daughters brought him here.  Patient states he has been currently being treated for candidiasis of his scrotum but denies any testicle pain, difficulty urinating or pain with his penis.  Patient denies any abdominal pain nausea vomiting fevers constipation diarrhea.    Home Medications Prior to Admission medications   Medication Sig Start Date End Date Taking? Authorizing Provider  albuterol (VENTOLIN HFA) 108 (90 Base) MCG/ACT inhaler Inhale 2 puffs into the lungs every 6 (six) hours as needed for wheezing or shortness of breath. 09/18/22   Shelva Majestic, MD  allopurinol (ZYLOPRIM) 300 MG tablet TAKE ONE-HALF TABLET BY MOUTH IN THE EVENING 03/24/23   Shelva Majestic, MD  amLODipine (NORVASC) 5 MG tablet Take 1 tablet (5 mg total) by mouth daily. For blood pressure and possible Raynauds 03/24/23   Shelva Majestic, MD  cephALEXin (KEFLEX) 500 MG capsule Take 1 capsule (500 mg total) by mouth 3 (three) times daily for 7 days. 07/28/23 08/04/23  Shelva Majestic, MD  fluocinonide (LIDEX) 0.05 % external solution APPLY 1 ML TO AFFECTED AREAS OF SCALP AND CHEST NIGHTLY 06/01/23   Shelva Majestic,  MD  fluticasone [facility]) 50 MCG/ACT nasal spray Place 2 sprays into both nostrils daily. 07/20/22   Shelva Majestic, MD  furosemide (LASIX) 20 MG tablet Take 1 tablet (20 mg total) by mouth daily as needed for edema or fluid. 05/25/23   Shelva Majestic, MD  ketoconazole (NIZORAL) 2 % cream Apply 1 Application topically 2 (two) times daily. For up to 2 weeks 07/28/23   Shelva Majestic, MD  Multiple Vitamin (MULTIVITAMIN) tablet Take 1 tablet by mouth daily.    [provider]  tamsulosin (FLOMAX) 0.4 MG CAPS capsule Take 1 capsule (0.4 mg total) by mouth daily. Patient taking differently: Take 0.4 mg by mouth daily as needed (Prostate). 09/18/22   Shelva Majestic, MD  terbinafine (LAMISIL) 250 MG tablet TAKE 1 TABLET (250 MG TOTAL) BY MOUTH DAILY. FOR TOENAIL FUNGUS/ONYCHOMYCOSIS 07/06/23   Shelva Majestic, MD  triamterene-hydrochlorothiazide (MAXZIDE-25) 37.5-25 MG tablet TAKE 1/2 TABLET BY MOUTH DAILY Patient taking differently: Take 0.5 tablets by mouth daily as needed. 1 tablet every day and on Wednesdays - takes 1.5 tablet. 02/09/23   Shelva Majestic, MD  warfarin (COUMADIN) 2.5 MG tablet TAKE 1 TABLET BY MOUTH DAILY OR AS DIRECTED BY ANTICOAGULATION CLINIC 02/02/23   Shelva Majestic, MD  zolpidem (AMBIEN) 5 MG tablet TAKE 1 TABLET BY MOUTH EVERY DAY AT BEDTIME AS NEEDED FOR SLEEP 03/22/23   Shelva Majestic, MD      Allergies  Patient has no known allergies.    Review of Systems   Review of Systems  Neurological:  Positive for weakness.    Physical Exam Updated Vital Signs BP (!) 144/79 (BP Location: Left Arm)   Pulse 95   Temp 98.7 F (37.1 C) (Oral)   Resp 20   SpO2 98%  Physical Exam Constitutional:      General: He is not in acute distress. Eyes:     Extraocular Movements: Extraocular movements intact.     Conjunctiva/sclera: Conjunctivae normal.     Pupils: Pupils are equal, round, and reactive to light.  Cardiovascular:     Rate and Rhythm: Normal  rate and regular rhythm.  Pulmonary:     Effort: Pulmonary effort is normal. No respiratory distress.     Breath sounds: Normal breath sounds.  Abdominal:     Palpations: Abdomen is soft.     Tenderness: There is no abdominal tenderness. There is no guarding or rebound.  Genitourinary:    Comments: Chaperone: Haley, PA-S Obvious candidiasis on scrotum and penis however no signs of fluctuance in the perineum, purulent discharge, testicle pain, scrotal swelling, paraphimosis or phimosis or lymphadenopathy Musculoskeletal:        General: Normal range of motion.     Cervical back: Normal range of motion. No rigidity or tenderness.  Skin:    General: Skin is warm and dry.     Capillary Refill: Capillary refill takes less than 2 seconds.  Neurological:     General: No focal deficit present.     Mental Status: He is alert and oriented to person, place, and time.     Sensory: Sensation is intact.     Motor: Motor function is intact.     Coordination: Coordination is intact.     Comments: Cranial nerves III through XII intact Vision grossly intact Sensation tact all 4 extremities  Psychiatric:        Mood and Affect: Mood normal.     ED Results / Procedures / Treatments   Labs (all labs ordered are listed, but only abnormal results are displayed) Labs Reviewed  COMPREHENSIVE METABOLIC PANEL WITH GFR - Abnormal; Notable for the following components:      Result Value   Potassium 3.0 (*)    Chloride 97 (*)    Glucose, Bld 124 (*)    BUN 31 (*)    Creatinine, Ser 1.27 (*)    Calcium 8.8 (*)    Total Protein 6.3 (*)    Albumin 3.3 (*)    Total Bilirubin 1.7 (*)    GFR, Estimated 52 (*)    All other components within normal limits  CBC WITH DIFFERENTIAL/PLATELET - Abnormal; Notable for the following components:   WBC 18.2 (*)    Platelets 129 (*)    Neutro Abs 15.2 (*)    Monocytes Absolute 1.4 (*)    Abs Immature Granulocytes 0.20 (*)    All other components within normal  limits  URINALYSIS, ROUTINE W REFLEX MICROSCOPIC - Abnormal; Notable for the following components:   Protein, ur 100 (*)    All other components within normal limits  PROTIME-INR - Abnormal; Notable for the following components:   Prothrombin Time 25.1 (*)    INR 2.2 (*)    All other components within normal limits  CBG MONITORING, ED - Abnormal; Notable for the following components:   Glucose-Capillary 119 (*)    All other components within normal limits  RESP PANEL BY RT-PCR (RSV, FLU  A&B, COVID)  RVPGX2  MAGNESIUM  I-STAT CG4 LACTIC ACID, ED  I-STAT CG4 LACTIC ACID, ED    EKG None  Radiology No results found.  Procedures Procedures  {Document cardiac monitor, telemetry assessment procedure when appropriate:1}  Medications Ordered in ED Medications  potassium chloride SA (KLOR-CON M) CR tablet 40 mEq (has no administration in time range)  cefTRIAXone (ROCEPHIN) 2 g in sodium chloride 0.9 % 100 mL IVPB (2 g Intravenous New Bag/Given 07/28/23 2159)    ED Course/ Medical Decision Making/ A&P   {   Click here for ABCD2, HEART and other calculatorsREFRESH Note before signing :1}                              Medical Decision Making Amount and/or Complexity of Data Reviewed Labs: ordered. Radiology: ordered.  Risk Prescription drug management.   Cathlean Marseilles 89 y.o. presented today for AMS. Working DDx that I considered at this time includes, but not limited to, CVA, ICH, intracranial mass, critical dehydration, heptatic dysfunction, uremia, hypercarbia/hypoxia/COPD exacerbation, intoxication, endocrine abnormality, toxidrome, UTI, adrenal insufficiency, hypoglycemia/hyperglycemia, paraneoplastic process, CNS infection, meningitis.  R/o DDx: ***: These work and that it however unlikely due to patient's history, physical exam, presentation, lab/imaging findings  Review of prior external notes: ***  Unique Tests and My Independent Interpretation:  CT Head wo Contrast:  ***  CBC: *** CMP: *** UA: *** CXR: *** TSH: *** UDS: *** VBG: *** EKG: Rate, rhythm, axis, intervals all examined and without medically relevant abnormality. ST segments without concerns for elevations  Social Determinants of Health: {social:31487}  Discussion with Independent Historian: {historian:29369}  Discussion of Management of Tests: {historian:29369}  Risk: {Risk:29370}  Risk Stratification Score: ***  Staffed with ***  Plan: On exam patient was ***. The cardiac monitor was ordered secondary to the patient's history of AMS and to monitor the patient for dysrhythmia. Cardiac monitor by my independent interpretation showed   This chart was dictated using voice recognition software.  Despite best efforts to proofread,  errors can occur which can change the documentation meaning.   {Document critical care time when appropriate:1} {Document review of labs and clinical decision tools ie heart score, Chads2Vasc2 etc:1}  {Document your independent review of radiology images, and any outside records:1} {Document your discussion with family members, caretakers, and with consultants:1} {Document social determinants of health affecting pt's care:1} {Document your decision making why or why not admission, treatments were needed:1} Final Clinical Impression(s) / ED Diagnoses Final diagnoses:  None    Rx / DC Orders ED Discharge Orders     None

## 2023-07-28 NOTE — ED Provider Notes (Signed)
 [place] EMERGENCY DEPARTMENT AT [facility] Provider Note   CSN: [id number] Arrival date & time: 07/28/23  2017     History  Chief Complaint  Patient presents with   Weakness   Altered Mental Status    William Mahoney is a 89 y.o. male history of COPD, inguinal hernia, AAA, diabetes, PAD, hypertension, A-fib on warfarin presented for 2 days of altered mental status.  Patient and daughter states that patient will occasionally say every 2 hours that he feels off and starts to "zone out."Patient is not had any fevers but went to his primary care and was told to come to the ER for further evaluation as his urine there showed a UTI.  Patient had 1 dose of Keflex earlier today and began feeling more altered and so his 2 daughters brought him here.  Patient states he has been currently being treated for candidiasis of his scrotum but denies any testicle pain, difficulty urinating or pain with his penis.  Patient denies any abdominal pain nausea vomiting fevers constipation diarrhea.    Home Medications Prior to Admission medications   Medication Sig Start Date End Date Taking? Authorizing Provider  albuterol (VENTOLIN HFA) 108 (90 Base) MCG/ACT inhaler Inhale 2 puffs into the lungs every 6 (six) hours as needed for wheezing or shortness of breath. 09/18/22   Shelva Majestic, MD  allopurinol (ZYLOPRIM) 300 MG tablet TAKE ONE-HALF TABLET BY MOUTH IN THE EVENING 03/24/23   Shelva Majestic, MD  amLODipine (NORVASC) 5 MG tablet Take 1 tablet (5 mg total) by mouth daily. For blood pressure and possible Raynauds 03/24/23   Shelva Majestic, MD  cephALEXin (KEFLEX) 500 MG capsule Take 1 capsule (500 mg total) by mouth 3 (three) times daily for 7 days. 07/28/23 08/04/23  Shelva Majestic, MD  fluocinonide (LIDEX) 0.05 % external solution APPLY 1 ML TO AFFECTED AREAS OF SCALP AND CHEST NIGHTLY 06/01/23   Shelva Majestic, MD  fluticasone [facility]) 50 MCG/ACT nasal spray Place 2 sprays into  both nostrils daily. 07/20/22   Shelva Majestic, MD  furosemide (LASIX) 20 MG tablet Take 1 tablet (20 mg total) by mouth daily as needed for edema or fluid. 05/25/23   Shelva Majestic, MD  ketoconazole (NIZORAL) 2 % cream Apply 1 Application topically 2 (two) times daily. For up to 2 weeks 07/28/23   Shelva Majestic, MD  Multiple Vitamin (MULTIVITAMIN) tablet Take 1 tablet by mouth daily.    [provider]  tamsulosin (FLOMAX) 0.4 MG CAPS capsule Take 1 capsule (0.4 mg total) by mouth daily. Patient taking differently: Take 0.4 mg by mouth daily as needed (Prostate). 09/18/22   Shelva Majestic, MD  terbinafine (LAMISIL) 250 MG tablet TAKE 1 TABLET (250 MG TOTAL) BY MOUTH DAILY. FOR TOENAIL FUNGUS/ONYCHOMYCOSIS 07/06/23   Shelva Majestic, MD  triamterene-hydrochlorothiazide (MAXZIDE-25) 37.5-25 MG tablet TAKE 1/2 TABLET BY MOUTH DAILY Patient taking differently: Take 0.5 tablets by mouth daily as needed. 1 tablet every day and on Wednesdays - takes 1.5 tablet. 02/09/23   Shelva Majestic, MD  warfarin (COUMADIN) 2.5 MG tablet TAKE 1 TABLET BY MOUTH DAILY OR AS DIRECTED BY ANTICOAGULATION CLINIC 02/02/23   Shelva Majestic, MD  zolpidem (AMBIEN) 5 MG tablet TAKE 1 TABLET BY MOUTH EVERY DAY AT BEDTIME AS NEEDED FOR SLEEP 03/22/23   Shelva Majestic, MD      Allergies    Patient has no known allergies.    Review  of Systems   Review of Systems  Neurological:  Positive for weakness.    Physical Exam Updated Vital Signs BP (!) 144/79 (BP Location: Left Arm)   Pulse 95   Temp 98.7 F (37.1 C) (Oral)   Resp 20   SpO2 98%  Physical Exam Constitutional:      General: He is not in acute distress. Eyes:     Extraocular Movements: Extraocular movements intact.     Conjunctiva/sclera: Conjunctivae normal.     Pupils: Pupils are equal, round, and reactive to light.  Cardiovascular:     Rate and Rhythm: Normal rate and regular rhythm.  Pulmonary:     Effort: Pulmonary effort is  normal. No respiratory distress.     Breath sounds: Normal breath sounds.  Abdominal:     Palpations: Abdomen is soft.     Tenderness: There is no abdominal tenderness. There is no guarding or rebound.  Genitourinary:    Comments: Chaperone: Haley, PA-S Obvious candidiasis on scrotum and penis however no signs of fluctuance in the perineum, purulent discharge, testicle pain, scrotal swelling, paraphimosis or phimosis or lymphadenopathy Musculoskeletal:        General: Normal range of motion.     Cervical back: Normal range of motion. No rigidity or tenderness.  Skin:    General: Skin is warm and dry.     Capillary Refill: Capillary refill takes less than 2 seconds.  Neurological:     General: No focal deficit present.     Mental Status: He is alert and oriented to person, place, and time.     Sensory: Sensation is intact.     Motor: Motor function is intact.     Coordination: Coordination is intact.     Comments: Cranial nerves III through XII intact Vision grossly intact Sensation tact all 4 extremities  Psychiatric:        Mood and Affect: Mood normal.     ED Results / Procedures / Treatments   Labs (all labs ordered are listed, but only abnormal results are displayed) Labs Reviewed  COMPREHENSIVE METABOLIC PANEL WITH GFR - Abnormal; Notable for the following components:      Result Value   Potassium 3.0 (*)    Chloride 97 (*)    Glucose, Bld 124 (*)    BUN 31 (*)    Creatinine, Ser 1.27 (*)    Calcium 8.8 (*)    Total Protein 6.3 (*)    Albumin 3.3 (*)    Total Bilirubin 1.7 (*)    GFR, Estimated 52 (*)    All other components within normal limits  CBC WITH DIFFERENTIAL/PLATELET - Abnormal; Notable for the following components:   WBC 18.2 (*)    Platelets 129 (*)    Neutro Abs 15.2 (*)    Monocytes Absolute 1.4 (*)    Abs Immature Granulocytes 0.20 (*)    All other components within normal limits  URINALYSIS, ROUTINE W REFLEX MICROSCOPIC - Abnormal; Notable for  the following components:   Protein, ur 100 (*)    All other components within normal limits  PROTIME-INR - Abnormal; Notable for the following components:   Prothrombin Time 25.1 (*)    INR 2.2 (*)    All other components within normal limits  CBG MONITORING, ED - Abnormal; Notable for the following components:   Glucose-Capillary 119 (*)    All other components within normal limits  RESP PANEL BY RT-PCR (RSV, FLU A&B, COVID)  [id number]  CULTURE, BLOOD (ROUTINE X  2)  CULTURE, BLOOD (ROUTINE X 2)  MAGNESIUM  I-STAT CG4 LACTIC ACID, ED  I-STAT CG4 LACTIC ACID, ED    EKG None  Radiology No results found.  Procedures Procedures    Medications Ordered in ED Medications  potassium chloride SA (KLOR-CON M) CR tablet 40 mEq (has no administration in time range)  potassium chloride SA (KLOR-CON M) CR tablet 40 mEq (has no administration in time range)  cefTRIAXone (ROCEPHIN) 2 g in sodium chloride 0.9 % 100 mL IVPB (2 g Intravenous New Bag/Given 07/28/23 2159)    ED Course/ Medical Decision Making/ A&P                                 Medical Decision Making Amount and/or Complexity of Data Reviewed Labs: ordered. Radiology: ordered.  Risk Prescription drug management.   William Mahoney 89 y.o. presented today for AMS. Working DDx that I considered at this time includes, but not limited to, CVA, ICH, intracranial mass, critical dehydration, heptatic dysfunction, uremia, hypercarbia/hypoxia/COPD exacerbation, intoxication, endocrine abnormality, toxidrome, UTI, adrenal insufficiency, hypoglycemia/hyperglycemia, paraneoplastic process, CNS infection, meningitis.  R/o DDx: CVA, ICH, intracranial mass, critical dehydration, heptatic dysfunction, uremia, hypercarbia/hypoxia/COPD exacerbation, intoxication, endocrine abnormality, toxidrome, UTI, adrenal insufficiency, hypoglycemia/hyperglycemia, paraneoplastic process, CNS infection, meningitis: These work and that it however unlikely  due to patient's history, physical exam, presentation, lab/imaging findings  Review of prior external notes: 02/20/2019  Unique Tests and My Independent Interpretation:  CT Head wo Contrast: No acute abnormalities CBC: Leukocytosis 18.2 with left shift CMP: Hypokalemia 3.0 Magnesium: Unremarkable UA: Some WBCs noted however no leukocytes or nitrites PT/INR: Unremarkable CXR: No acute pathology EKG: Pending Scrotal ultrasound: Pending Lactic acid: Unremarkable Respiratory panel: Patient declines as he had -1 earlier today  Social Determinants of Health: none  Discussion with Independent Historian:  daughters  Discussion of Management of Tests:  Emokpae, MD Hospitalist  Risk: High: hospitalization or escalation of hospital-level care  Risk Stratification Score: None  Staffed with Wallace Cullens, DO  Plan: On exam patient was no acute distress with stable vitals.  Physical exam with a chaperone present does show candidiasis of the scrotum.  There are no signs of deep tissue infection, Fournier's, testicular pain note indicates infection there is bacterial in nature however will get ultrasound to rule out abnormalities given that he is altered with a white count of 18.  The rest of patient's exam is unremarkable as he is alert and oriented x 3 and has no neurodeficits.  Labs and imaging were ordered that ultimately show hypokalemia that was replenished along with a white count of 18.2 with left shift.  UA here shows some WBCs but no nitrates or leukocytes and so do think they had a dirty sample at the primary care's office.  Imaging by my independent rotation does not show any acute abnormalities however I spoke to the hospitalist for admission given the fact patient is progressive altered state along with being 89 have a white count of 18 to which the hospitalist agreed to go and see the patient.  This chart was dictated using voice recognition software.  Despite best efforts to proofread,  errors  can occur which can change the documentation meaning.         Final Clinical Impression(s) / ED Diagnoses Final diagnoses:  Acute cystitis with hematuria  Encephalopathy, unspecified type  Candidiasis of genitalia    Rx / DC Orders ED Discharge Orders  None         Netta Corrigan, PA-C 07/29/23 0007    Franne Forts, DO 07/29/23 [phone]

## 2023-07-28 NOTE — ED Triage Notes (Addendum)
 Pt arrives via EMS from home CO loss of appetite, new AMS, and weakness that started today. Pt was recently diagnosed with UTI, cystitis and infection of foreskin, started on keflex today. Reports "not feeling himself today" Baseline AxOx4, independent.  Hx afib EMS gave 1L LR en route.

## 2023-07-28 NOTE — Progress Notes (Signed)
 Phone [phone] In person visit   Subjective:   William Mahoney is a 89 y.o. year old very pleasant male patient who presents for/with See problem oriented charting Chief Complaint  Patient presents with   Dysuria    Pt c/o painful urination that started yesterday, has had decreased energy and chills with fatigue and weakness. Grandson states he has noticed sores on pt penis also (did not want to mention in front of pt daughter)   Past Medical History-  Patient Active Problem List   Diagnosis Date Noted   Diabetes mellitus type 2, controlled (HCC) 03/24/2023    Priority: High   AAA (abdominal aortic aneurysm) without rupture 11/20/2019    Priority: High   Edema 11/10/2016    Priority: High   Atrial fibrillation (HCC) 12/07/2008    Priority: High   PAD (peripheral artery disease) (HCC) 11/20/2019    Priority: Medium    Chronic kidney disease (CKD), stage III (moderate) (HCC) 02/15/2019    Priority: Medium    Aortic atherosclerosis (HCC) 01/05/2019    Priority: Medium    Insomnia 10/19/2014    Priority: Medium    Gout 01/09/2014    Priority: Medium    History of cancer of rectosigmoid junction 01/04/2008    Priority: Medium    Former smoker 06/23/2007    Priority: Medium    Hyperlipidemia, unspecified 02/09/2007    Priority: Medium    Essential hypertension 12/07/2006    Priority: Medium    COPD (chronic obstructive pulmonary disease) (HCC) 12/07/2006    Priority: Medium    BPH (benign prostatic hyperplasia) 12/07/2006    Priority: Medium    Encounter for therapeutic drug monitoring 08/27/2014    Priority: Low   Allergic rhinitis 08/15/2013    Priority: Low   Arthritis of lumbar spine 01/20/2013    Priority: Low   Inguinal hernia 03/21/2010    Priority: Low   BASAL CELL CARCINOMA SKIN LOWER LIMB INCL HIP 03/21/2010    Priority: Low   Actinic keratosis 12/07/2008    Priority: Low   TMJ (temporomandibular joint disorder) 02/09/2007    Priority: Low   URINARY  INCONTINENCE 12/07/2006    Priority: Low   History of digestive system disease 12/07/2006    Priority: Low   Porokeratosis 11/25/2021    Medications- reviewed and updated Current Outpatient Medications  Medication Sig Dispense Refill   albuterol (VENTOLIN HFA) 108 (90 Base) MCG/ACT inhaler Inhale 2 puffs into the lungs every 6 (six) hours as needed for wheezing or shortness of breath. 1 each 2   allopurinol (ZYLOPRIM) 300 MG tablet TAKE ONE-HALF TABLET BY MOUTH IN THE EVENING 45 tablet 3   amLODipine (NORVASC) 5 MG tablet Take 1 tablet (5 mg total) by mouth daily. For blood pressure and possible Raynauds 90 tablet 3   fluocinonide (LIDEX) 0.05 % external solution APPLY 1 ML TO AFFECTED AREAS OF SCALP AND CHEST NIGHTLY 60 mL 3   fluticasone (FLONASE) 50 MCG/ACT nasal spray Place 2 sprays into both nostrils daily. 48 g 3   furosemide (LASIX) 20 MG tablet Take 1 tablet (20 mg total) by mouth daily as needed for edema or fluid. 90 tablet 1   Multiple Vitamin (MULTIVITAMIN) tablet Take 1 tablet by mouth daily.     tamsulosin (FLOMAX) 0.4 MG CAPS capsule Take 1 capsule (0.4 mg total) by mouth daily. (Patient taking differently: Take 0.4 mg by mouth daily as needed (Prostate).) 90 capsule 3   terbinafine (LAMISIL) 250 MG tablet TAKE 1  TABLET (250 MG TOTAL) BY MOUTH DAILY. FOR TOENAIL FUNGUS/ONYCHOMYCOSIS 45 tablet 0   triamterene-hydrochlorothiazide (MAXZIDE-25) 37.5-25 MG tablet TAKE 1/2 TABLET BY MOUTH DAILY (Patient taking differently: Take 0.5 tablets by mouth daily as needed. 1 tablet every day and on Wednesdays - takes 1.5 tablet.) 45 tablet 1   warfarin (COUMADIN) 2.5 MG tablet TAKE 1 TABLET BY MOUTH DAILY OR AS DIRECTED BY ANTICOAGULATION CLINIC 110 tablet 1   zolpidem (AMBIEN) 5 MG tablet TAKE 1 TABLET BY MOUTH EVERY DAY AT BEDTIME AS NEEDED FOR SLEEP 30 tablet 5   No current facility-administered medications for this visit.     Objective:  BP 102/76   Pulse 77   Temp (!) 97.3 F (36.3  C)   Ht 5\' 11"  (1.803 m)   BMI 22.45 kg/m  Gen: NAD, resting comfortably CV: regular rate Lungs: CTAB no crackles, wheeze, rhonchi Abdomen: soft/nontender/nondistended/normal bowel sounds. No rebound or guarding.  Ext: stable 1+ edema Skin: warm, dry Neuro: in wheelchair- declined weight today  Results for orders placed or performed in visit on 07/28/23 (from the past 24 hours)  POCT Influenza A/B     Status: None   Collection Time: 07/28/23 12:49 PM  Result Value Ref Range   Influenza A, POC Negative Negative   Influenza B, POC Negative Negative  POC COVID-19     Status: None   Collection Time: 07/28/23 12:50 PM  Result Value Ref Range   SARS Coronavirus 2 Ag Negative Negative       Assessment and Plan   # Urinary hesitancy and urgency # Fatigue/generalized achiness S: Patients symptoms started 2-3 days ago. Has noted some sores on head of penis - 5-7 days. No discharge from penis. Not dating/sexually active Complains of dysuria: no; polyuria: no; nocturia: no; urgency and hesitancy: YES and has had dribbling- using pads - noting urine.  Symptoms are worsening. Has noted chills and some decreased energy/fatigue. Very low appetite.  Generalized achiness/body aches ROS- no fever, nausea, vomiting, flank pain. No blood in urine. A/P: UA will be obtained-concern for UTI with urinary hesitancy and urgency and not feeling well-will get culture.  Prostate exam nontender and not boggy-we opted for empiric treatment with: Keflex 5 mg 3 times a day -He also has candidal balanitis-grandson reports has not had a shower in 10 days-whitish discharge noted with retraction of foreskin throughout glans and onto the shaft of the penis - from avs "Sitz baths - Soaking of the penis in warm water containing a weak salt solution two to three times per day is advised while inflammation persists. -retract foreskin and make sure clean- then dry thoroughly and apply antifungal ketoconazole  Continue  fluconazole by mouth"- on this for fingernails but suspect hygiene is the key issue here -A1c has been well-controlled so do not think this is primarily related to diabetes  With fatigue and generalized achiness this certainly could be caused by UTI but will also get CBC and CMP-our team also collected flu and COVID testing to evaluate other possibilities -he has had some confusion and trouble with his pills lately-not sure if this is going to be an ongoing issue or just related to acute infection-we consider pill packs and transitioning to Eliquis for ease of use but we opted to reconsider this at a later date -We discussed if new or worsening symptoms overnight would recommend emergency department visit but hoping we can get him feeling better on outpatient basis.  Close follow-up in 1 week  #  Insomnia-still not sleeping well even if he takes 1-1/2 of his Ambien 5 mg-I advised against this.  We discussed possible Belsomra but do not want to make changes while we are treating infection   #hypertension S: medication: Amlodipine 2.5 mg--> 5 mg for help with Raynaud's, triamterene hydrochlorothiazide 37.5-25 mg, Lasix 20 mg as needed for edema A/P: Blood pressure is perhaps overcontrolled and has not taken triamterene hydrochlorothiazide yet today-I asked him to hold this unless blood pressure at home is over 140/90  Recommended follow up: Return in about 1 week (around 08/04/2023) for followup or sooner if needed.Schedule b4 you leave. Future Appointments  Date Time Provider Department Center  08/09/2023 11:20 AM Shelva Majestic, MD [facility]  08/11/2023 10:00 AM [facility]  10/01/2023  3:15 PM [facility] [facility] None    Lab/Order associations:   ICD-10-CM   1. Urinary urgency  R39.15 Urine Culture    Urinalysis, Routine w reflex microscopic    CANCELED: POCT Urinalysis Dipstick (Automated)    2. Urinary hesitancy  R39.11 Urine Culture    Urinalysis,  Routine w reflex microscopic    CANCELED: POCT Urinalysis Dipstick (Automated)    3. Generalized weakness  R53.1 Comprehensive metabolic panel with GFR    CBC with Differential/Platelet    POC COVID-19    POCT Influenza A/B    4. Fatigue, unspecified type  R53.83 Comprehensive metabolic panel with GFR    CBC with Differential/Platelet    POC COVID-19    POCT Influenza A/B    5. Essential hypertension  I10     6. Insomnia, unspecified type  G47.00       Meds ordered this encounter  Medications   ketoconazole (NIZORAL) 2 % cream    Sig: Apply 1 Application topically 2 (two) times daily. For up to 2 weeks    Dispense:  60 g    Refill:  1   cephALEXin (KEFLEX) 500 MG capsule    Sig: Take 1 capsule (500 mg total) by mouth 3 (three) times daily for 7 days.    Dispense:  21 capsule    Refill:  0    Time Spent: 59 minutes of total time (11:50 AM- 12:49 PM) was spent on the date of the encounter performing the following actions: chart review prior to seeing the patient, obtaining history, performing a medically necessary exam, counseling on the workup and treatment plan including appropriate hygiene particularly for balanitis, placing orders, and documenting in our EHR.    Return precautions advised.  Tana Conch, MD

## 2023-07-28 NOTE — Patient Instructions (Addendum)
 Sitz baths - Soaking of the penis in warm water containing a weak salt solution two to three times per day is advised while inflammation persists. -retract foreskin and make sure clean- then dry thoroughly and apply antifungal ketoconazole  Continue fluconazole by mouth  Team please make sure he has a urine cup to trial again today or to take home if cannot urinate  Stay off blood pressure medicine unless blood pressure over 140/90 at home- blood pressure lower today  Want to offer bloodwork   Recommended follow up: Return in about 1 week (around 08/04/2023) for followup or sooner if needed.Schedule b4 you leave.

## 2023-07-28 NOTE — Telephone Encounter (Signed)
 Spoke with patient daughter, stated patient is sleeping at this time  no temperature and he start  his antibiotic today  Advise if has fever, confusion not feeling recommenced to go to ED for evaluation and possible IV  Verbalized understanding

## 2023-07-29 ENCOUNTER — Observation Stay (HOSPITAL_COMMUNITY)

## 2023-07-29 ENCOUNTER — Ambulatory Visit: Admitting: Family Medicine

## 2023-07-29 ENCOUNTER — Encounter: Payer: Self-pay | Admitting: Family Medicine

## 2023-07-29 ENCOUNTER — Encounter (HOSPITAL_COMMUNITY): Payer: Self-pay | Admitting: Family Medicine

## 2023-07-29 DIAGNOSIS — Z9049 Acquired absence of other specified parts of digestive tract: Secondary | ICD-10-CM | POA: Diagnosis not present

## 2023-07-29 DIAGNOSIS — E1122 Type 2 diabetes mellitus with diabetic chronic kidney disease: Secondary | ICD-10-CM | POA: Diagnosis present

## 2023-07-29 DIAGNOSIS — E876 Hypokalemia: Secondary | ICD-10-CM | POA: Diagnosis present

## 2023-07-29 DIAGNOSIS — N400 Enlarged prostate without lower urinary tract symptoms: Secondary | ICD-10-CM | POA: Diagnosis present

## 2023-07-29 DIAGNOSIS — Z87891 Personal history of nicotine dependence: Secondary | ICD-10-CM | POA: Diagnosis not present

## 2023-07-29 DIAGNOSIS — Z79899 Other long term (current) drug therapy: Secondary | ICD-10-CM | POA: Diagnosis not present

## 2023-07-29 DIAGNOSIS — J189 Pneumonia, unspecified organism: Secondary | ICD-10-CM | POA: Diagnosis present

## 2023-07-29 DIAGNOSIS — E1151 Type 2 diabetes mellitus with diabetic peripheral angiopathy without gangrene: Secondary | ICD-10-CM | POA: Diagnosis present

## 2023-07-29 DIAGNOSIS — Z85048 Personal history of other malignant neoplasm of rectum, rectosigmoid junction, and anus: Secondary | ICD-10-CM | POA: Diagnosis not present

## 2023-07-29 DIAGNOSIS — I129 Hypertensive chronic kidney disease with stage 1 through stage 4 chronic kidney disease, or unspecified chronic kidney disease: Secondary | ICD-10-CM | POA: Diagnosis present

## 2023-07-29 DIAGNOSIS — G9341 Metabolic encephalopathy: Secondary | ICD-10-CM | POA: Diagnosis present

## 2023-07-29 DIAGNOSIS — I482 Chronic atrial fibrillation, unspecified: Secondary | ICD-10-CM | POA: Diagnosis present

## 2023-07-29 DIAGNOSIS — Z7901 Long term (current) use of anticoagulants: Secondary | ICD-10-CM | POA: Diagnosis not present

## 2023-07-29 DIAGNOSIS — B3749 Other urogenital candidiasis: Secondary | ICD-10-CM | POA: Diagnosis present

## 2023-07-29 DIAGNOSIS — E785 Hyperlipidemia, unspecified: Secondary | ICD-10-CM | POA: Diagnosis present

## 2023-07-29 DIAGNOSIS — Z807 Family history of other malignant neoplasms of lymphoid, hematopoietic and related tissues: Secondary | ICD-10-CM | POA: Diagnosis not present

## 2023-07-29 DIAGNOSIS — N390 Urinary tract infection, site not specified: Secondary | ICD-10-CM | POA: Diagnosis present

## 2023-07-29 DIAGNOSIS — B351 Tinea unguium: Secondary | ICD-10-CM | POA: Diagnosis present

## 2023-07-29 DIAGNOSIS — Z85820 Personal history of malignant melanoma of skin: Secondary | ICD-10-CM | POA: Diagnosis not present

## 2023-07-29 DIAGNOSIS — M109 Gout, unspecified: Secondary | ICD-10-CM | POA: Diagnosis present

## 2023-07-29 DIAGNOSIS — R531 Weakness: Secondary | ICD-10-CM | POA: Diagnosis present

## 2023-07-29 DIAGNOSIS — Z1152 Encounter for screening for COVID-19: Secondary | ICD-10-CM | POA: Diagnosis not present

## 2023-07-29 DIAGNOSIS — J449 Chronic obstructive pulmonary disease, unspecified: Secondary | ICD-10-CM | POA: Diagnosis present

## 2023-07-29 DIAGNOSIS — Z823 Family history of stroke: Secondary | ICD-10-CM | POA: Diagnosis not present

## 2023-07-29 DIAGNOSIS — Z8249 Family history of ischemic heart disease and other diseases of the circulatory system: Secondary | ICD-10-CM | POA: Diagnosis not present

## 2023-07-29 DIAGNOSIS — R509 Fever, unspecified: Secondary | ICD-10-CM | POA: Diagnosis present

## 2023-07-29 DIAGNOSIS — D72829 Elevated white blood cell count, unspecified: Secondary | ICD-10-CM | POA: Diagnosis present

## 2023-07-29 LAB — BASIC METABOLIC PANEL WITH GFR
Anion gap: 10 (ref 5–15)
BUN: 30 mg/dL — ABNORMAL HIGH (ref 8–23)
CO2: 25 mmol/L (ref 22–32)
Calcium: 8.5 mg/dL — ABNORMAL LOW (ref 8.9–10.3)
Chloride: 99 mmol/L (ref 98–111)
Creatinine, Ser: 1.15 mg/dL (ref 0.61–1.24)
GFR, Estimated: 59 mL/min — ABNORMAL LOW (ref >60)
Glucose, Bld: 134 mg/dL — ABNORMAL HIGH (ref 70–99)
Potassium: 3.7 mmol/L (ref 3.5–5.1)
Sodium: 134 mmol/L — ABNORMAL LOW (ref 135–145)

## 2023-07-29 LAB — RESP PANEL BY RT-PCR (RSV, FLU A&B, COVID)  RVPGX2
Influenza A by PCR: NEGATIVE
Influenza B by PCR: NEGATIVE
Resp Syncytial Virus by PCR: NEGATIVE
SARS Coronavirus 2 by RT PCR: NEGATIVE

## 2023-07-29 LAB — PHOSPHORUS: Phosphorus: 1.7 mg/dL — ABNORMAL LOW (ref 2.5–4.6)

## 2023-07-29 LAB — RESPIRATORY PANEL BY PCR

## 2023-07-29 LAB — PROTIME-INR
INR: 2 — ABNORMAL HIGH (ref 0.8–1.2)
Prothrombin Time: 22.9 s — ABNORMAL HIGH (ref 11.4–15.2)

## 2023-07-29 LAB — CBC
HCT: 43.3 % (ref 39.0–52.0)
Hemoglobin: 14.2 g/dL (ref 13.0–17.0)
MCH: 30.9 pg (ref 26.0–34.0)
MCHC: 32.8 g/dL (ref 30.0–36.0)
MCV: 94.1 fL (ref 80.0–100.0)
Platelets: 124 10*3/uL — ABNORMAL LOW (ref 150–400)
RBC: 4.6 MIL/uL (ref 4.22–5.81)
RDW: 14.6 % (ref 11.5–15.5)
WBC: 16 10*3/uL — ABNORMAL HIGH (ref 4.0–10.5)
nRBC: 0 % (ref 0.0–0.2)

## 2023-07-29 LAB — TSH: TSH: 3.04 u[IU]/mL (ref 0.350–4.500)

## 2023-07-29 LAB — PROCALCITONIN: Procalcitonin: 0.14 ng/mL

## 2023-07-29 LAB — MAGNESIUM: Magnesium: 2 mg/dL (ref 1.7–2.4)

## 2023-07-29 MED ORDER — SODIUM CHLORIDE 0.9% FLUSH
3.0000 mL | Freq: Two times a day (BID) | INTRAVENOUS | Status: DC
  Administered 2023-07-29 – 2023-07-30 (×3): 3 mL via INTRAVENOUS

## 2023-07-29 MED ORDER — ACETAMINOPHEN 325 MG PO TABS: 650.0000 mg | ORAL_TABLET | Freq: Four times a day (QID) | ORAL | Status: DC | PRN

## 2023-07-29 MED ORDER — TRAZODONE HCL 50 MG PO TABS
50.0000 mg | ORAL_TABLET | Freq: Every evening | ORAL | Status: DC | PRN
  Administered 2023-07-29: 50 mg via ORAL
  Filled 2023-07-29: qty 1

## 2023-07-29 MED ORDER — AZITHROMYCIN 250 MG PO TABS
500.0000 mg | ORAL_TABLET | Freq: Every day | ORAL | Status: DC
  Administered 2023-07-29 – 2023-07-30 (×2): 500 mg via ORAL
  Filled 2023-07-29 (×2): qty 2

## 2023-07-29 MED ORDER — NAPHAZOLINE-GLYCERIN 0.012-0.25 % OP SOLN: 1.0000 [drp] | Freq: Four times a day (QID) | OPHTHALMIC | Status: DC | PRN

## 2023-07-29 MED ORDER — SENNOSIDES-DOCUSATE SODIUM 8.6-50 MG PO TABS: 1.0000 | ORAL_TABLET | Freq: Every evening | ORAL | Status: DC | PRN

## 2023-07-29 MED ORDER — MELATONIN 5 MG PO TABS
5.0000 mg | ORAL_TABLET | Freq: Once | ORAL | Status: AC
  Administered 2023-07-30: 5 mg via ORAL
  Filled 2023-07-29: qty 1

## 2023-07-29 MED ORDER — TERBINAFINE HCL 250 MG PO TABS
250.0000 mg | ORAL_TABLET | Freq: Every day | ORAL | Status: DC
  Administered 2023-07-29 – 2023-07-30 (×2): 250 mg via ORAL
  Filled 2023-07-29 (×2): qty 1

## 2023-07-29 MED ORDER — GUAIFENESIN 100 MG/5ML PO LIQD: 5.0000 mL | ORAL | Status: DC | PRN

## 2023-07-29 MED ORDER — ONDANSETRON HCL 4 MG/2ML IJ SOLN: 4.0000 mg | Freq: Four times a day (QID) | INTRAMUSCULAR | Status: DC | PRN

## 2023-07-29 MED ORDER — WARFARIN SODIUM 2.5 MG PO TABS
2.5000 mg | ORAL_TABLET | Freq: Once | ORAL | Status: AC
  Administered 2023-07-29: 2.5 mg via ORAL
  Filled 2023-07-29: qty 1

## 2023-07-29 MED ORDER — SODIUM CHLORIDE 0.9 % IV SOLN
1.0000 g | INTRAVENOUS | Status: DC
  Administered 2023-07-29: 1 g via INTRAVENOUS
  Filled 2023-07-29: qty 10

## 2023-07-29 MED ORDER — ZOLPIDEM TARTRATE 5 MG PO TABS
5.0000 mg | ORAL_TABLET | Freq: Once | ORAL | Status: AC
  Administered 2023-07-29: 5 mg via ORAL
  Filled 2023-07-29: qty 1

## 2023-07-29 MED ORDER — POLYETHYLENE GLYCOL 3350 17 G PO PACK: 17.0000 g | PACK | Freq: Every day | ORAL | Status: DC | PRN

## 2023-07-29 MED ORDER — SODIUM CHLORIDE 0.9% FLUSH
3.0000 mL | Freq: Two times a day (BID) | INTRAVENOUS | Status: DC
  Administered 2023-07-30: 3 mL via INTRAVENOUS

## 2023-07-29 MED ORDER — WARFARIN - PHARMACIST DOSING INPATIENT
Freq: Every day | Status: DC
Start: 2023-07-30 — End: 2023-07-30

## 2023-07-29 MED ORDER — HYDRALAZINE HCL 20 MG/ML IJ SOLN: 10.0000 mg | INTRAMUSCULAR | Status: DC | PRN

## 2023-07-29 MED ORDER — ONDANSETRON HCL 4 MG PO TABS: 4.0000 mg | ORAL_TABLET | Freq: Four times a day (QID) | ORAL | Status: DC | PRN

## 2023-07-29 MED ORDER — IPRATROPIUM-ALBUTEROL 0.5-2.5 (3) MG/3ML IN SOLN: 3.0000 mL | RESPIRATORY_TRACT | Status: DC | PRN

## 2023-07-29 MED ORDER — ALBUTEROL SULFATE (2.5 MG/3ML) 0.083% IN NEBU: 2.5000 mg | INHALATION_SOLUTION | RESPIRATORY_TRACT | Status: DC | PRN

## 2023-07-29 MED ORDER — SODIUM CHLORIDE 0.9 % IV SOLN: INTRAVENOUS | Status: AC

## 2023-07-29 MED ORDER — ACETAMINOPHEN 650 MG RE SUPP: 650.0000 mg | Freq: Four times a day (QID) | RECTAL | Status: DC | PRN

## 2023-07-29 MED ORDER — METOPROLOL TARTRATE 5 MG/5ML IV SOLN: 5.0000 mg | INTRAVENOUS | Status: DC | PRN

## 2023-07-29 MED ORDER — NYSTATIN-TRIAMCINOLONE 100000-0.1 UNIT/GM-% EX OINT
TOPICAL_OINTMENT | Freq: Two times a day (BID) | CUTANEOUS | Status: DC
  Filled 2023-07-29: qty 15

## 2023-07-29 MED ORDER — TAMSULOSIN HCL 0.4 MG PO CAPS
0.4000 mg | ORAL_CAPSULE | Freq: Every day | ORAL | Status: DC
  Administered 2023-07-29 – 2023-07-30 (×2): 0.4 mg via ORAL
  Filled 2023-07-29 (×2): qty 1

## 2023-07-29 MED ORDER — BISACODYL 10 MG RE SUPP: 10.0000 mg | Freq: Every day | RECTAL | Status: DC | PRN

## 2023-07-29 MED ORDER — SODIUM CHLORIDE 0.9% FLUSH
3.0000 mL | INTRAVENOUS | Status: DC | PRN
  Administered 2023-07-29: 3 mL via INTRAVENOUS

## 2023-07-29 MED ORDER — AMLODIPINE BESYLATE 5 MG PO TABS
5.0000 mg | ORAL_TABLET | Freq: Every day | ORAL | Status: DC
  Administered 2023-07-29 – 2023-07-30 (×2): 5 mg via ORAL
  Filled 2023-07-29 (×2): qty 1

## 2023-07-29 MED ORDER — SODIUM CHLORIDE 0.9 % IV SOLN: INTRAVENOUS | Status: AC | PRN

## 2023-07-29 MED ORDER — POTASSIUM CHLORIDE CRYS ER 20 MEQ PO TBCR
40.0000 meq | EXTENDED_RELEASE_TABLET | Freq: Once | ORAL | Status: AC
  Administered 2023-07-29: 40 meq via ORAL
  Filled 2023-07-29: qty 2

## 2023-07-29 NOTE — Progress Notes (Signed)
 PHARMACY - ANTICOAGULATION CONSULT NOTE  Pharmacy Consult for Coumadin Indication: atrial fibrillation  No Known Allergies  Patient Measurements: Height: 5\' 11"  (180.3 cm) Weight: 73 kg (160 lb 15 oz) IBW/kg (Calculated) : 75.3 HEPARIN DW (KG): 73  Vital Signs: Temp: 99.2 F (37.3 C) (04/10 0446) Temp Source: Oral (04/10 0446) BP: 118/67 (04/10 0446) Pulse Rate: 77 (04/10 0446)  Labs: Recent Labs    07/28/23 1245 07/28/23 2125 07/28/23 2200  HGB 15.2 14.0  --   HCT 46.0 42.9  --   PLT 145.0* 129*  --   LABPROT  --   --  25.1*  INR  --   --  2.2*  CREATININE 1.34 1.27*  --     Estimated Creatinine Clearance: 35.9 mL/min (A) (by C-G formula based on SCr of 1.27 mg/dL (H)).   Medical History: Past Medical History:  Diagnosis Date   BPH (benign prostatic hypertrophy)    Chronic atrial fibrillation [facility])    cardiologist--   dr berry   COPD (chronic obstructive pulmonary disease) (HCC)    Diverticulosis of colon    MODERATE LEFT SIDE   Epididymal cyst    w/ epididymalitis   Full dentures    Gout    Hand foot syndrome    secondary to chemotherapy (Xeloda)   cold hands/feet   Hearing loss    History of alcohol abuse    quit drinking in the mid 80's   History of cirrhosis of liver    alcoholic--  hx alcohol abuse -- quit drinking 1980's   History of gout    History of melanoma excision    2013--  left ear lobe and back of hand   History of pancreatitis    2008   History of rectal cancer oncologist-  dr Truett Perna--  no recurrence   dx Sept 2009--  Stage II (T3N0)  s/p  sigmoid colectomy & low anterior resection 04-18-2008  and chemoradiation 2010   History of shingles 07/27/2010   History of squamous cell carcinoma excision    left lower leg   Hyperlipidemia    Hypertension    Loose stools    DUE TO ANTIBIOTICS   Macular degeneration    not sure which eye   OA (osteoarthritis)    RBBB (right bundle branch block)    Renal lesion    chronic-- left side    Sciatica of right side    Urge urinary incontinence    intermittant    Medications:  Medications Prior to Admission  Medication Sig Dispense Refill Last Dose/Taking   albuterol (VENTOLIN HFA) 108 (90 Base) MCG/ACT inhaler Inhale 2 puffs into the lungs every 6 (six) hours as needed for wheezing or shortness of breath. 1 each 2    allopurinol (ZYLOPRIM) 300 MG tablet TAKE ONE-HALF TABLET BY MOUTH IN THE EVENING 45 tablet 3    amLODipine (NORVASC) 5 MG tablet Take 1 tablet (5 mg total) by mouth daily. For blood pressure and possible Raynauds 90 tablet 3    cephALEXin (KEFLEX) 500 MG capsule Take 1 capsule (500 mg total) by mouth 3 (three) times daily for 7 days. 21 capsule 0    fluocinonide (LIDEX) 0.05 % external solution APPLY 1 ML TO AFFECTED AREAS OF SCALP AND CHEST NIGHTLY 60 mL 3    fluticasone (FLONASE) 50 MCG/ACT nasal spray Place 2 sprays into both nostrils daily. 48 g 3    furosemide (LASIX) 20 MG tablet Take 1 tablet (20 mg total) by mouth  daily as needed for edema or fluid. 90 tablet 1    ketoconazole (NIZORAL) 2 % cream Apply 1 Application topically 2 (two) times daily. For up to 2 weeks 60 g 1    Multiple Vitamin (MULTIVITAMIN) tablet Take 1 tablet by mouth daily.      tamsulosin (FLOMAX) 0.4 MG CAPS capsule Take 1 capsule (0.4 mg total) by mouth daily. (Patient taking differently: Take 0.4 mg by mouth daily as needed (Prostate).) 90 capsule 3    terbinafine (LAMISIL) 250 MG tablet TAKE 1 TABLET (250 MG TOTAL) BY MOUTH DAILY. FOR TOENAIL FUNGUS/ONYCHOMYCOSIS 45 tablet 0    triamterene-hydrochlorothiazide (MAXZIDE-25) 37.5-25 MG tablet TAKE 1/2 TABLET BY MOUTH DAILY (Patient taking differently: Take 0.5 tablets by mouth daily as needed. 1 tablet every day and on Wednesdays - takes 1.5 tablet.) 45 tablet 1    warfarin (COUMADIN) 2.5 MG tablet TAKE 1 TABLET BY MOUTH DAILY OR AS DIRECTED BY ANTICOAGULATION CLINIC 110 tablet 1    zolpidem (AMBIEN) 5 MG tablet TAKE 1 TABLET BY MOUTH  EVERY DAY AT BEDTIME AS NEEDED FOR SLEEP 30 tablet 5     Assessment: Pt on Coumadin PTA for Afib.   Per clinic records, he should be taking Coumadin 2.5mg  po daily except 3.75mg  on Mon & Sat.  INR 2.2- therapeutic on admission.   Goal of Therapy:  INR 2-3   Plan:  Coumadin 2.5mg  po x1 today Daily PT/IR  Junita Push PharmD 07/29/2023,5:55 AM

## 2023-07-29 NOTE — TOC Initial Note (Signed)
 Transition of Care [facility]) - Initial/Assessment Note    Patient Details  Name: William Mahoney MRN: [id number] Date of Birth: 12/25/1925  Transition of Care [facility] CM/SW Contact:    William Foot, RN Phone Number: 07/29/2023, 4:03 PM  Clinical Narrative:                 Presented for UTI form home. NCM entered room patient resting with eyes closed. Patient open eyes. NCM ask if she could speak to his daughter about his care. Patient stated sure. NCM called daughter William Mahoney [phone]). NCM asked for patients name and DOB prior to exchanging information. William Mahoney states that patient lives with her. Has DME (cane, rollator, [place], [employer]) and requested an order for about [place]. NCM informed her that Medicare does not pay for [facility]. William Mahoney states that patient is not current with [facility] and will transport patient home via private vehicle at discharge. TOC will follow for progression to discharge.  Expected Discharge Plan: Home/Self Care Barriers to Discharge: Continued Medical Work up   Patient Goals and CMS Choice Patient states their goals for this hospitalization and ongoing recovery are:: Return to home with daughter          Expected Discharge Plan and Services In-house Referral: NA Discharge Planning Services: CM Consult Post Acute Care Choice: NA Living arrangements for the past 2 months: Single Family Home                 DME Arranged: N/A DME Agency: NA       HH Arranged: NA HH Agency: NA        Prior Living Arrangements/Services Living arrangements for the past 2 months: Single Family Home Lives with:: Adult Children Patient language and need for interpreter reviewed:: Yes Do you feel safe going back to the place where you live?: Yes      Need for Family Participation in Patient Care: Yes (Comment) Care giver support system in place?: Yes (comment) Current home services: DME (cane, rollator, [place], [employer]) Criminal Activity/Legal Involvement Pertinent to Current  Situation/Hospitalization: No - Comment as needed  Activities of Daily Living   ADL Screening (condition at time of admission) Independently performs ADLs?: No Does the patient have a NEW difficulty with bathing/dressing/toileting/self-feeding that is expected to last >3 days?: No Does the patient have a NEW difficulty with getting in/out of bed, walking, or climbing stairs that is expected to last >3 days?: No Does the patient have a NEW difficulty with communication that is expected to last >3 days?: No Is the patient deaf or have difficulty hearing?: No Does the patient have difficulty seeing, even when wearing glasses/contacts?: No Does the patient have difficulty concentrating, remembering, or making decisions?: No  Permission Sought/Granted Permission sought to share information with : Case Manager Permission granted to share information with : Yes, Verbal Permission Granted  Share Information with NAME: William Mahoney     Permission granted to share info w Relationship: daughter  Permission granted to share info w Contact Information: [phone]  Emotional Assessment Appearance:: Appears stated age Attitude/Demeanor/Rapport: Other (comment) (sleepy) Affect (typically observed): Appropriate   Alcohol / Substance Use: Not Applicable Psych Involvement: No (comment)  Admission diagnosis:  Acute cystitis with hematuria [N30.01] Acute metabolic encephalopathy [G93.41] Candidiasis of genitalia [B37.49] Encephalopathy, unspecified type [G93.40] UTI (urinary tract infection) [N39.0] Patient Active Problem List   Diagnosis Date Noted   Acute metabolic encephalopathy 07/29/2023   Leukocytosis 07/29/2023   Fever 07/29/2023   UTI (urinary tract infection)  07/29/2023   Diabetes mellitus type 2, controlled (HCC) 03/24/2023   Porokeratosis 11/25/2021   AAA (abdominal aortic aneurysm) without rupture 11/20/2019   PAD (peripheral artery disease) (HCC) 11/20/2019   Chronic kidney  disease (CKD), stage III (moderate) (HCC) 02/15/2019   Aortic atherosclerosis (HCC) 01/05/2019   Edema 11/10/2016   Insomnia 10/19/2014   Encounter for therapeutic drug monitoring 08/27/2014   Gout 01/09/2014   Allergic rhinitis 08/15/2013   Arthritis of lumbar spine 01/20/2013   Inguinal hernia 03/21/2010   BASAL CELL CARCINOMA SKIN LOWER LIMB INCL HIP 03/21/2010   Atrial fibrillation (HCC) 12/07/2008   Actinic keratosis 12/07/2008   History of cancer of rectosigmoid junction 01/04/2008   Former smoker 06/23/2007   Hyperlipidemia, unspecified 02/09/2007   TMJ (temporomandibular joint disorder) 02/09/2007   Essential hypertension 12/07/2006   COPD (chronic obstructive pulmonary disease) (HCC) 12/07/2006   BPH (benign prostatic hyperplasia) 12/07/2006   URINARY INCONTINENCE 12/07/2006   History of digestive system disease 12/07/2006   PCP:  Shelva Majestic, MD Pharmacy:   [id number] - [place], [place] - [address] [place] [address] Phone: [phone] Fax: [phone]     Social Drivers of Health (SDOH) Social History: SDOH Screenings   Food Insecurity: No Food Insecurity (07/29/2023)  Housing: Unknown (07/29/2023)  Transportation Needs: No Transportation Needs (07/29/2023)  Utilities: Not At Risk (07/29/2023)  Depression (PHQ2-9): Low Risk  (07/28/2023)  Physical Activity: Inactive (12/26/2021)  Social Connections: Moderately Isolated (07/29/2023)  Tobacco Use: Medium Risk (07/28/2023)   SDOH Interventions:     Readmission Risk Interventions     No data to display

## 2023-07-29 NOTE — Progress Notes (Signed)
 PHARMACY - ANTICOAGULATION CONSULT NOTE  Pharmacy Consult for warfarin Indication: atrial fibrillation  No Known Allergies  Patient Measurements: Height: 5\' 11"  (180.3 cm) Weight: 73 kg (160 lb 15 oz) IBW/kg (Calculated) : 75.3 HEPARIN DW (KG): 73  Vital Signs: Temp: 98.5 F (36.9 C) (04/10 0902) Temp Source: Oral (04/10 0446) BP: 116/66 (04/10 0902) Pulse Rate: 82 (04/10 0902)  Labs: Recent Labs    07/28/23 1245 07/28/23 2125 07/28/23 2200 07/29/23 0836 07/29/23 0912  HGB 15.2 14.0  --   --  14.2  HCT 46.0 42.9  --   --  43.3  PLT 145.0* 129*  --   --  124*  LABPROT  --   --  25.1* 22.9*  --   INR  --   --  2.2* 2.0*  --   CREATININE 1.34 1.27*  --   --  1.15    Estimated Creatinine Clearance: 39.7 mL/min (by C-G formula based on SCr of 1.15 mg/dL).   Medical History: Past Medical History:  Diagnosis Date   BPH (benign prostatic hypertrophy)    Chronic atrial fibrillation [facility])    cardiologist--   dr berry   COPD (chronic obstructive pulmonary disease) (HCC)    Diverticulosis of colon    MODERATE LEFT SIDE   Epididymal cyst    w/ epididymalitis   Full dentures    Gout    Hand foot syndrome    secondary to chemotherapy (Xeloda)   cold hands/feet   Hearing loss    History of alcohol abuse    quit drinking in the mid 80's   History of cirrhosis of liver    alcoholic--  hx alcohol abuse -- quit drinking 1980's   History of gout    History of melanoma excision    2013--  left ear lobe and back of hand   History of pancreatitis    2008   History of rectal cancer oncologist-  dr Truett Perna--  no recurrence   dx Sept 2009--  Stage II (T3N0)  s/p  sigmoid colectomy & low anterior resection 04-18-2008  and chemoradiation 2010   History of shingles 07/27/2010   History of squamous cell carcinoma excision    left lower leg   Hyperlipidemia    Hypertension    Loose stools    DUE TO ANTIBIOTICS   Macular degeneration    not sure which eye   OA  (osteoarthritis)    RBBB (right bundle branch block)    Renal lesion    chronic-- left side   Sciatica of right side    Urge urinary incontinence    intermittant    Medications:  - Per [facility] clinic on 4/2, warfarin regimen is 3.75 mg on Wed & Sat and 2.5 mg all other days - Per pt's daughter Belenda Cruise), pt manages his own medications at home. She's not sure if he has been taking the warfarin regimen as recommended by the [facility] clinic for the past week due to his mentation .  Assessment: Pt is a 89 yo male with hx thrombocytopenia, afib on warfarin PTA, stage III CKD, who presented to the ED on 07/28/23 with c/o fevers, weakness, and intermittent confusion episodes. Head CT no significant finding. Pharmacy has been consulted to manage warfarin dosing.  Today, 07/29/2023: - INR 2.0 - therapeutic - Hgb 14.2, plts low at 124 (baseline plts 130-140s) - No bleeding but has bruises all over the body per RN - DDIs: being on abx can make pt sensitive to  warfarin, pt currently on ceftriaxone  Goal of Therapy:  INR 2-3 Monitor platelets by anticoagulation protocol: Yes   Plan:  - Warfarin 2.5 mg PO x1 - Daily CBC and INR - Monitor for s/sx bleeding  William Mahoney, PharmD Candidate 07/29/2023,9:57 AM

## 2023-07-29 NOTE — H&P (Addendum)
 History and Physical    Patient: William Mahoney AVW:[id number] DOB: May 22, 1925 DOA: 07/28/2023 DOS: the patient was seen and examined on 07/29/2023 PCP: Shelva Majestic, MD  Patient coming from: Home  Chief Complaint:  Chief Complaint  Patient presents with   Weakness   Altered Mental Status   HPI: William Mahoney is a 89 y.o. male with medical history significant for BPH, chronic A-fib, on Coumadin, COPD, HTN and HLD presents to the ED with fevers, fatigue, malaise, weakness and intermittent confusion episodes and is found to have a leukocytosis.- - Additional history obtained from patient's 2 daughters  (Carlene and Belenda Cruise) at bedside -Apparently patient had fevers up to 101 at home -He was seen by Dr. Deneen Harts in the clinic UA at that time was suspicious for UTI but appears to be a poor sample -Repeat UA in the ED is not very impressive -No productive cough, no sick contacts,  No Nausea, Vomiting or Diarrhea  In ED CT head without acute findings CXR--IMPRESSION: 1. Left lower lobe rounded airspace opacity may correlate to pulmonary nodule noted on CT chest 04/01/2023. Consider repeat chest x-ray PA and lateral view for further evaluation. -COVID RSV and flu negative -WBC 18.2, platelets 129 -Creatinine is 1.34 which is not far from baseline -Potassium is 3.5 sodium 138 bicarb 33  Review of Systems: As mentioned in the history of present illness. All other systems reviewed and are negative. Past Medical History:  Diagnosis Date   BPH (benign prostatic hypertrophy)    Chronic atrial fibrillation [facility])    cardiologist--   dr berry   COPD (chronic obstructive pulmonary disease) (HCC)    Diverticulosis of colon    MODERATE LEFT SIDE   Epididymal cyst    w/ epididymalitis   Full dentures    Gout    Hand foot syndrome    secondary to chemotherapy (Xeloda)   cold hands/feet   Hearing loss    History of alcohol abuse    quit drinking in the mid 80's   History of  cirrhosis of liver    alcoholic--  hx alcohol abuse -- quit drinking 1980's   History of gout    History of melanoma excision    2013--  left ear lobe and back of hand   History of pancreatitis    2008   History of rectal cancer oncologist-  dr Truett Perna--  no recurrence   dx Sept 2009--  Stage II (T3N0)  s/p  sigmoid colectomy & low anterior resection 04-18-2008  and chemoradiation 2010   History of shingles 07/27/2010   History of squamous cell carcinoma excision    left lower leg   Hyperlipidemia    Hypertension    Loose stools    DUE TO ANTIBIOTICS   Macular degeneration    not sure which eye   OA (osteoarthritis)    RBBB (right bundle branch block)    Renal lesion    chronic-- left side   Sciatica of right side    Urge urinary incontinence    intermittant   Past Surgical History:  Procedure Laterality Date   CARDIAC CATHETERIZATION  2001  approx.  in [place]   CATARACT EXTRACTION W/ INTRAOCULAR LENS  IMPLANT, BILATERAL     COLONOSCOPY  last one 06-27-2012   EXPLORATORY LAPAROTOMY/  LOW ANTERIOR RESECTION/  SIGMOID COLECTOMY  04-18-2008   DR INGRAM   INCISION AND DRAINAGE ABSCESS N/A 11/02/2014   Procedure: INCISION AND DRAINAGE OF SCROTUM;  Surgeon: Crist Fat,  MD;  Location: [facility];  Service: Urology;  Laterality: N/A;   INGUINAL HERNIA REPAIR Right 03/23/2014   Procedure: HERNIA REPAIR INGUINAL ADULT OPEN REPAIR RIGHT INGUINAL HERNIA REPAIR;  Surgeon: Claud Kelp, MD;  Location: [facility] OR;  Service: General;  Laterality: Right;   INSERTION OF MESH Right 03/23/2014   Procedure: INSERTION OF MESH;  Surgeon: Claud Kelp, MD;  Location: [facility] OR;  Service: General;  Laterality: Right;   LAPAROSCOPIC CHOLECYSTECTOMY  1990's   MASS EXCISION N/A 11/02/2014   Procedure: EXCISION OF EPIDIDYMAL CYST;  Surgeon: Crist Fat, MD;  Location: [facility];  Service: Urology;  Laterality: N/A;   TONSILLECTOMY  as child   TRANSTHORACIC  ECHOCARDIOGRAM  05-11-2008   pseudonormal LV filling pattern,  ef 60-65%/  mild to moderate MV calcification no stenosis w/ mild to moderate regurg./  mild LAE and RAE/  mild TR   Social History:  reports that he quit smoking about 15 years ago. His smoking use included cigarettes. He started smoking about 70 years ago. He has a 55 pack-year smoking history. He has never used smokeless tobacco. He reports that he does not drink alcohol and does not use drugs.  No Known Allergies  Family History  Problem Relation Age of Onset   Hypertension Mother    Lymphoma Father    Lymphoma Other    Stroke Other        1st degree relative   Colon cancer Neg Hx    Esophageal cancer Neg Hx    Rectal cancer Neg Hx    Stomach cancer Neg Hx     Prior to Admission medications   Medication Sig Start Date End Date Taking? Authorizing Provider  albuterol (VENTOLIN HFA) 108 (90 Base) MCG/ACT inhaler Inhale 2 puffs into the lungs every 6 (six) hours as needed for wheezing or shortness of breath. 09/18/22   Shelva Majestic, MD  allopurinol (ZYLOPRIM) 300 MG tablet TAKE ONE-HALF TABLET BY MOUTH IN THE EVENING 03/24/23   Shelva Majestic, MD  amLODipine (NORVASC) 5 MG tablet Take 1 tablet (5 mg total) by mouth daily. For blood pressure and possible Raynauds 03/24/23   Shelva Majestic, MD  cephALEXin (KEFLEX) 500 MG capsule Take 1 capsule (500 mg total) by mouth 3 (three) times daily for 7 days. 07/28/23 08/04/23  Shelva Majestic, MD  fluocinonide (LIDEX) 0.05 % external solution APPLY 1 ML TO AFFECTED AREAS OF SCALP AND CHEST NIGHTLY 06/01/23   Shelva Majestic, MD  fluticasone [facility]) 50 MCG/ACT nasal spray Place 2 sprays into both nostrils daily. 07/20/22   Shelva Majestic, MD  furosemide (LASIX) 20 MG tablet Take 1 tablet (20 mg total) by mouth daily as needed for edema or fluid. 05/25/23   Shelva Majestic, MD  ketoconazole (NIZORAL) 2 % cream Apply 1 Application topically 2 (two) times daily. For up to 2  weeks 07/28/23   Shelva Majestic, MD  Multiple Vitamin (MULTIVITAMIN) tablet Take 1 tablet by mouth daily.    [provider]  tamsulosin (FLOMAX) 0.4 MG CAPS capsule Take 1 capsule (0.4 mg total) by mouth daily. Patient taking differently: Take 0.4 mg by mouth daily as needed (Prostate). 09/18/22   Shelva Majestic, MD  terbinafine (LAMISIL) 250 MG tablet TAKE 1 TABLET (250 MG TOTAL) BY MOUTH DAILY. FOR TOENAIL FUNGUS/ONYCHOMYCOSIS 07/06/23   Shelva Majestic, MD  triamterene-hydrochlorothiazide (MAXZIDE-25) 37.5-25 MG tablet TAKE 1/2 TABLET BY MOUTH DAILY Patient taking differently:  Take 0.5 tablets by mouth daily as needed. 1 tablet every day and on Wednesdays - takes 1.5 tablet. 02/09/23   Shelva Majestic, MD  warfarin (COUMADIN) 2.5 MG tablet TAKE 1 TABLET BY MOUTH DAILY OR AS DIRECTED BY ANTICOAGULATION CLINIC 02/02/23   Shelva Majestic, MD  zolpidem (AMBIEN) 5 MG tablet TAKE 1 TABLET BY MOUTH EVERY DAY AT BEDTIME AS NEEDED FOR SLEEP 03/22/23   Shelva Majestic, MD    Physical Exam: Vitals:   07/28/23 2028 07/29/23 0049 07/29/23 0112 07/29/23 0446  BP: (!) 144/79 132/77  118/67  Pulse: 95 86  77  Resp: 20 18  20   Temp: 98.7 F (37.1 C) 99 F (37.2 C)  99.2 F (37.3 C)  TempSrc: Oral Oral  Oral  SpO2: 98% 96%  93%  Weight:   73 kg   Height:   5\' 11"  (1.803 m)     Physical Exam Gen:- Awake Alert, in no acute distress  HEENT:- [place].AT, No sclera icterus Neck-Supple Neck,No JVD,.  Lungs-  CTAB , fair air movement bilaterally  CV- S1, S2 normal, RRR Abd-  +ve B.Sounds, Abd Soft, No tenderness, no CVA area tenderness Extremity/Skin:- No  edema,   good pedal pulses  Psych-affect is appropriate, oriented x3 Neuro-no new focal deficits, no tremors GU--  Media Information  Document Information  Photos    07/29/2023 00:21  Attached To:  Hospital Encounter on 07/28/23  Source Information  Shon Hale, MD  [facility]  Document History     Data  Reviewed: CT head without acute findings CXR--IMPRESSION: 1. Left lower lobe rounded airspace opacity may correlate to pulmonary nodule noted on CT chest 04/01/2023. Consider repeat chest x-ray PA and lateral view for further evaluation. -COVID RSV and flu negative -WBC 18.2, platelets 129 -Creatinine is 1.34 which is not far from baseline -Potassium is 3.5 sodium 138 bicarb 33  Assessment and Plan: 1) fever and leukocytosis----?? etiology -Family reports increasing fatigue and intermittent confusion as well -Patient received Rocephin in the ED -Hold off on further antibiotics pending blood and urine culture data  2) candidiasis of the penile area and onychomycosis of the feet --- topical Mycolog requested for the penile area -Continue PTA Lamisil  3)HTN--hold Maxide and Lasix, continue amlodipine  4) intermittent confusion/acute metabolic encephalopathy--- appears to have been associated with fevers --Patient is currently coherent -CTA without acute findings - 5)HTN--restart amlodipine  6)BPH--restart Flomax  7) chronic anticoagulation--pharmacy consult requested for Coumadin management   Advance Care Planning:   Code Status: Full Code   Family Communication:  Discussed with  patient's 2 daughters  (Carlene and Winfield) at bedside  Severity of Illness: The appropriate patient status for this patient is OBSERVATION. Observation status is judged to be reasonable and necessary in order to provide the required intensity of service to ensure the patient's safety. The patient's presenting symptoms, physical exam findings, and initial radiographic and laboratory data in the context of their medical condition is felt to place them at decreased risk for further clinical deterioration. Furthermore, it is anticipated that the patient will be medically stable for discharge from the hospital within 2 midnights of admission.   Author: Shon Hale, MD 07/29/2023 5:52 AM  For on call  review [email or web address].

## 2023-07-29 NOTE — Plan of Care (Signed)

## 2023-07-29 NOTE — Progress Notes (Signed)
 PROGRESS NOTE    William Mahoney  ZHY:[id number] DOB: November 15, 1925 DOA: 07/28/2023 PCP: Shelva Majestic, MD    Brief Narrative:   89 year old with history of BPH, chronic A-fib on Coumadin, COPD, HTN, HLD admitted for confusion, fevers and chills.  Upon admission CT head negative, chest x-ray showed concerns of possible left lower lobe opacity, COVID, RSV, flu was negative.  UA was not necessarily impressive.  There is concerns of Candida of his penile area/onychomycosis.  Given the's of empiric Rocephin  Assessment & Plan:  Principal Problem:   Acute metabolic encephalopathy Active Problems:   Leukocytosis   Fever   Essential hypertension   Atrial fibrillation (HCC)   Chronic kidney disease (CKD), stage III (moderate) (HCC)   PAD (peripheral artery disease) (HCC)   Diabetes mellitus type 2, controlled (HCC)   Acute metabolic encephalopathy Fever leukocytosis - No obvious source of infection.  Possible UTI, urine cultures ordered, follow blood cultures.  COVID, RSV, flu is negative.  Will check respiratory panel.  Will start empiric Rocephin and azithromycin for now.  Continue gentle hydration for next 24 hours.   Candida penile area/onychomycosis of his feet - Continue topical antifungal.  Was on Lamisil prior to arrival  Hypokalemia - As needed repletion.  Check magnesium and phosphorus  Essential hypertension - Norvasc.  IV as needed  BPH - Flomax  Chronic atrial fibrillation on Coumadin - Coumadin   DVT prophylaxis: SCDs Start: 07/29/23 0552 Place TED hose Start: 07/29/23 0552 warfarin (COUMADIN) tablet 2.5 mg      Code Status: Full Code Family Communication: Family at bedside Ongoing management for encephalopathy    Subjective:  According to the family patient appears to be better this morning.  They think IV fluids have likely helped him the most  Examination:  General exam: Appears calm and comfortable  Respiratory system: Clear to auscultation.  Respiratory effort normal. Cardiovascular system: S1 & S2 heard, RRR. No JVD, murmurs, rubs, gallops or clicks. No pedal edema. Gastrointestinal system: Abdomen is nondistended, soft and nontender. No organomegaly or masses felt. Normal bowel sounds heard. Central nervous system: Alert and oriented. No focal neurological deficits. Extremities: Symmetric 5 x 5 power. Skin: No rashes, lesions or ulcers Psychiatry: Judgement and insight appear normal. Mood & affect appropriate.                Diet Orders (From admission, onward)     Start     Ordered   07/29/23 0552  Diet Heart Room service appropriate? Yes; Fluid consistency: Thin  Diet effective now       Question Answer Comment  Room service appropriate? Yes   Fluid consistency: Thin      07/29/23 0552            Objective: Vitals:   07/29/23 0049 07/29/23 0112 07/29/23 0446 07/29/23 0902  BP: 132/77  118/67 116/66  Pulse: 86  77 82  Resp: 18  20 18   Temp: 99 F (37.2 C)  99.2 F (37.3 C) 98.5 F (36.9 C)  TempSrc: Oral  Oral   SpO2: 96%  93% 94%  Weight:  73 kg    Height:  5\' 11"  (1.803 m)      Intake/Output Summary (Last 24 hours) at 07/29/2023 1137 Last data filed at 07/29/2023 0930 Gross per 24 hour  Intake 240 ml  Output 350 ml  Net -110 ml   Filed Weights   07/29/23 0112  Weight: 73 kg    Scheduled Meds:  amLODipine  5 mg Oral Daily   azithromycin  500 mg Oral Daily   nystatin-triamcinolone ointment   Topical BID   sodium chloride flush  3 mL Intravenous Q12H   sodium chloride flush  3 mL Intravenous Q12H   tamsulosin  0.4 mg Oral Daily   terbinafine  250 mg Oral Daily   warfarin  2.5 mg Oral ONCE-1600   [START ON 07/30/2023] Warfarin - Pharmacist Dosing Inpatient   Does not apply q1600   Continuous Infusions:  sodium chloride     sodium chloride 75 mL/hr at 07/29/23 1128   cefTRIAXone (ROCEPHIN)  IV      Nutritional status     Body mass index is 22.45 kg/m.  Data Reviewed:    CBC: Recent Labs  Lab 07/28/23 1245 07/28/23 2125 07/29/23 0912  WBC 18.2 Repeated and verified X2.* 18.2* 16.0*  NEUTROABS 16.0* 15.2*  --   HGB 15.2 14.0 14.2  HCT 46.0 42.9 43.3  MCV 92.9 93.9 94.1  PLT 145.0* 129* 124*   Basic Metabolic Panel: Recent Labs  Lab 07/28/23 1245 07/28/23 2125 07/29/23 0912  NA 138 136 134*  K 3.5 3.0* 3.7  CL 95* 97* 99  CO2 33* 28 25  GLUCOSE 148* 124* 134*  BUN 32* 31* 30*  CREATININE 1.34 1.27* 1.15  CALCIUM 9.8 8.8* 8.5*  MG  --  2.0 2.0  PHOS  --   --  1.7*   GFR: Estimated Creatinine Clearance: 39.7 mL/min (by C-G formula based on SCr of 1.15 mg/dL). Liver Function Tests: Recent Labs  Lab 07/28/23 1245 07/28/23 2125  AST 26 26  ALT 15 17  ALKPHOS 71 57  BILITOT 2.0* 1.7*  PROT 7.2 6.3*  ALBUMIN 4.4 3.3*   No results for input(s): "LIPASE", "AMYLASE" in the last 168 hours. No results for input(s): "AMMONIA" in the last 168 hours. Coagulation Profile: Recent Labs  Lab 07/28/23 2200 07/29/23 0836  INR 2.2* 2.0*   Cardiac Enzymes: No results for input(s): "CKTOTAL", "CKMB", "CKMBINDEX", "TROPONINI" in the last 168 hours. BNP (last 3 results) Recent Labs    01/11/23 1344  PROBNP 188.0*   HbA1C: No results for input(s): "HGBA1C" in the last 72 hours. CBG: Recent Labs  Lab 07/28/23 2033  GLUCAP 119*   Lipid Profile: No results for input(s): "CHOL", "HDL", "LDLCALC", "TRIG", "CHOLHDL", "LDLDIRECT" in the last 72 hours. Thyroid Function Tests: Recent Labs    07/29/23 0912  TSH 3.040   Anemia Panel: No results for input(s): "VITAMINB12", "FOLATE", "FERRITIN", "TIBC", "IRON", "RETICCTPCT" in the last 72 hours. Sepsis Labs: Recent Labs  Lab 07/28/23 2211 07/29/23 0912  PROCALCITON  --  0.14  LATICACIDVEN 1.3  --     Recent Results (from the past 240 hours)  Resp panel by RT-PCR (RSV, Flu A&B, Covid) Anterior Nasal Swab     Status: None   Collection Time: 07/29/23  1:51 AM   Specimen: Anterior  Nasal Swab  Result Value Ref Range Status   SARS Coronavirus 2 by RT PCR NEGATIVE NEGATIVE Final    Comment: (NOTE) SARS-CoV-2 target nucleic acids are NOT DETECTED.  The SARS-CoV-2 RNA is generally detectable in upper respiratory specimens during the acute phase of infection. The lowest concentration of SARS-CoV-2 viral copies this assay can detect is 138 copies/mL. A negative result does not preclude SARS-Cov-2 infection and should not be used as the sole basis for treatment or other patient management decisions. A negative result may occur with  improper specimen collection/handling, submission of  specimen other than nasopharyngeal swab, presence of viral mutation(s) within the areas targeted by this assay, and inadequate number of viral copies(<138 copies/mL). A negative result must be combined with clinical observations, patient history, and epidemiological information. The expected result is Negative.  Fact Sheet for Patients:  BloggerCourse.com  Fact Sheet for Healthcare Providers:  SeriousBroker.it  This test is no t yet approved or cleared by the [place] FDA and  has been authorized for detection and/or diagnosis of SARS-CoV-2 by FDA under an Emergency Use Authorization (EUA). This EUA will remain  in effect (meaning this test can be used) for the duration of the COVID-19 declaration under Section 564(b)(1) of the Act, 21 U.S.C.section 360bbb-3(b)(1), unless the authorization is terminated  or revoked sooner.       Influenza A by PCR NEGATIVE NEGATIVE Final   Influenza B by PCR NEGATIVE NEGATIVE Final    Comment: (NOTE) The Xpert Xpress SARS-CoV-2/FLU/RSV plus assay is intended as an aid in the diagnosis of influenza from Nasopharyngeal swab specimens and should not be used as a sole basis for treatment. Nasal washings and aspirates are unacceptable for Xpert Xpress SARS-CoV-2/FLU/RSV testing.  Fact Sheet for  Patients: BloggerCourse.com  Fact Sheet for Healthcare Providers: SeriousBroker.it  This test is not yet approved or cleared by the [place] FDA and has been authorized for detection and/or diagnosis of SARS-CoV-2 by FDA under an Emergency Use Authorization (EUA). This EUA will remain in effect (meaning this test can be used) for the duration of the COVID-19 declaration under Section 564(b)(1) of the Act, 21 U.S.C. section 360bbb-3(b)(1), unless the authorization is terminated or revoked.     Resp Syncytial Virus by PCR NEGATIVE NEGATIVE Final    Comment: (NOTE) Fact Sheet for Patients: BloggerCourse.com  Fact Sheet for Healthcare Providers: SeriousBroker.it  This test is not yet approved or cleared by the [place] FDA and has been authorized for detection and/or diagnosis of SARS-CoV-2 by FDA under an Emergency Use Authorization (EUA). This EUA will remain in effect (meaning this test can be used) for the duration of the COVID-19 declaration under Section 564(b)(1) of the Act, 21 U.S.C. section 360bbb-3(b)(1), unless the authorization is terminated or revoked.  Performed at [facility], [address] [place], [place] [address]          Radiology Studies: CT Head Wo Contrast Result Date: 07/29/2023 CLINICAL DATA:  Altered mental status loss of appetite, new AMS, and weakness that started today. Pt was recently diagnosed with UTI, cystitis and infection of foreskin, started on keflex today. Reports "not feeling himself today". EXAM: CT HEAD WITHOUT CONTRAST TECHNIQUE: Contiguous axial images were obtained from the base of the skull through the vertex without intravenous contrast. RADIATION DOSE REDUCTION: This exam was performed according to the departmental dose-optimization program which includes automated exposure control, adjustment of the mA  and/or kV according to patient size and/or use of iterative reconstruction technique. COMPARISON:  MRI head 01/22/2022 trauma CT head 04/01/2023 FINDINGS: Brain: Patchy and confluent areas of decreased attenuation are noted throughout the deep and periventricular white matter of the cerebral hemispheres bilaterally, compatible with chronic microvascular ischemic disease. No evidence of large-territorial acute infarction. No parenchymal hemorrhage. No mass lesion. No extra-axial collection. No mass effect or midline shift. No hydrocephalus. Basilar cisterns are patent. Vascular: No hyperdense vessel. Atherosclerotic calcifications are present within the cavernous internal carotid arteries. Skull: No acute fracture or focal lesion. Sinuses/Orbits: Paranasal sinuses and mastoid air cells are clear. The orbits  are unremarkable. Other: None. IMPRESSION: No acute intracranial abnormality. Electronically Signed   By: Tish Frederickson M.D.   On: 07/29/2023 00:25   DG Chest Port 1 View Result Date: 07/29/2023 CLINICAL DATA:  Altered mental status EXAM: PORTABLE CHEST 1 VIEW COMPARISON:  Chest x-ray 01/10/11, CT chest 04/01/2023 FINDINGS: Patient is rotated. The heart and mediastinal contours are unchanged. Atherosclerotic plaque. Left lower lobe rounded airspace opacity. No pulmonary edema. No pleural effusion. No pneumothorax. No acute osseous abnormality. IMPRESSION: 1. Left lower lobe rounded airspace opacity may correlate to pulmonary nodule noted on CT chest 04/01/2023. Consider repeat chest x-ray PA and lateral view for further evaluation. 2.  Aortic Atherosclerosis (ICD10-I70.0). Electronically Signed   By: Tish Frederickson M.D.   On: 07/29/2023 00:22           LOS: 0 days   Time spent= 35 mins    Miguel Rota, MD [facility]  If 7PM-7AM, please contact night-coverage  07/29/2023, 11:37 AM

## 2023-07-29 NOTE — Telephone Encounter (Signed)
 I really appreciate Dr. Modesta Messing help on this as well as Francena Hanly for calling and checking on him

## 2023-07-29 NOTE — Hospital Course (Addendum)
 Brief Narrative:   89 year old with history of BPH, chronic A-fib on Coumadin, COPD, HTN, HLD admitted for confusion, fevers and chills.  Upon admission CT head negative, chest x-ray showed concerns of possible left lower lobe opacity, COVID, RSV, flu was negative.  UA was not necessarily impressive.  There is concerns of Candida of his penile area/onychomycosis.  Given the's of empiric Rocephin  Assessment & Plan:  Principal Problem:   Acute metabolic encephalopathy Active Problems:   Leukocytosis   Fever   Essential hypertension   Atrial fibrillation (HCC)   Chronic kidney disease (CKD), stage III (moderate) (HCC)   PAD (peripheral artery disease) (HCC)   Diabetes mellitus type 2, controlled (HCC)   Acute metabolic encephalopathy Fever leukocytosis - No obvious source of infection.  Possible UTI, urine cultures ordered, follow blood cultures.  COVID, RSV, flu is negative.  Will check respiratory panel.  Will start empiric Rocephin and azithromycin for now.  Continue gentle hydration for next 24 hours.   Candida penile area/onychomycosis of his feet - Continue topical antifungal.  Was on Lamisil prior to arrival  Hypokalemia - As needed repletion.  Check magnesium and phosphorus  Essential hypertension - Norvasc.  IV as needed  BPH - Flomax  Chronic atrial fibrillation on Coumadin - Coumadin   DVT prophylaxis: SCDs Start: 07/29/23 0552 Place TED hose Start: 07/29/23 0552 warfarin (COUMADIN) tablet 2.5 mg      Code Status: Full Code Family Communication: Family at bedside Ongoing management for encephalopathy    Subjective:  According to the family patient appears to be better this morning.  They think IV fluids have likely helped him the most  Examination:  General exam: Appears calm and comfortable  Respiratory system: Clear to auscultation. Respiratory effort normal. Cardiovascular system: S1 & S2 heard, RRR. No JVD, murmurs, rubs, gallops or clicks. No pedal  edema. Gastrointestinal system: Abdomen is nondistended, soft and nontender. No organomegaly or masses felt. Normal bowel sounds heard. Central nervous system: Alert and oriented. No focal neurological deficits. Extremities: Symmetric 5 x 5 power. Skin: No rashes, lesions or ulcers Psychiatry: Judgement and insight appear normal. Mood & affect appropriate.

## 2023-07-30 DIAGNOSIS — G9341 Metabolic encephalopathy: Secondary | ICD-10-CM | POA: Diagnosis not present

## 2023-07-30 LAB — BASIC METABOLIC PANEL WITH GFR
Anion gap: 6 (ref 5–15)
BUN: 25 mg/dL — ABNORMAL HIGH (ref 8–23)
CO2: 25 mmol/L (ref 22–32)
Calcium: 8.1 mg/dL — ABNORMAL LOW (ref 8.9–10.3)
Chloride: 103 mmol/L (ref 98–111)
Creatinine, Ser: 1 mg/dL (ref 0.61–1.24)
GFR, Estimated: >60 mL/min (ref >60)
Glucose, Bld: 106 mg/dL — ABNORMAL HIGH (ref 70–99)
Potassium: 3.5 mmol/L (ref 3.5–5.1)
Sodium: 134 mmol/L — ABNORMAL LOW (ref 135–145)

## 2023-07-30 LAB — URINE CULTURE
Culture: NO GROWTH
Special Requests: NORMAL

## 2023-07-30 LAB — PROTIME-INR
INR: 1.9 — ABNORMAL HIGH (ref 0.8–1.2)
Prothrombin Time: 22.2 s — ABNORMAL HIGH (ref 11.4–15.2)

## 2023-07-30 LAB — CBC
HCT: 40.6 % (ref 39.0–52.0)
Hemoglobin: 12.9 g/dL — ABNORMAL LOW (ref 13.0–17.0)
MCH: 31.4 pg (ref 26.0–34.0)
MCHC: 31.8 g/dL (ref 30.0–36.0)
MCV: 98.8 fL (ref 80.0–100.0)
Platelets: 120 10*3/uL — ABNORMAL LOW (ref 150–400)
RBC: 4.11 MIL/uL — ABNORMAL LOW (ref 4.22–5.81)
RDW: 14.6 % (ref 11.5–15.5)
WBC: 11.7 10*3/uL — ABNORMAL HIGH (ref 4.0–10.5)
nRBC: 0 % (ref 0.0–0.2)

## 2023-07-30 LAB — PHOSPHORUS: Phosphorus: 2.3 mg/dL — ABNORMAL LOW (ref 2.5–4.6)

## 2023-07-30 LAB — MAGNESIUM: Magnesium: 2.2 mg/dL (ref 1.7–2.4)

## 2023-07-30 MED ORDER — DOXYCYCLINE HYCLATE 100 MG PO CAPS: 100.0000 mg | ORAL_CAPSULE | Freq: Two times a day (BID) | ORAL | 0 refills | Status: AC

## 2023-07-30 MED ORDER — WARFARIN SODIUM 4 MG PO TABS
4.0000 mg | ORAL_TABLET | Freq: Once | ORAL | Status: DC
  Filled 2023-07-30: qty 1

## 2023-07-30 MED ORDER — POTASSIUM PHOSPHATES 15 MMOLE/5ML IV SOLN
15.0000 mmol | Freq: Once | INTRAVENOUS | Status: AC
  Administered 2023-07-30: 15 mmol via INTRAVENOUS
  Filled 2023-07-30: qty 5

## 2023-07-30 NOTE — Progress Notes (Signed)
 PHARMACY - ANTICOAGULATION CONSULT NOTE  Pharmacy Consult for warfarin Indication: atrial fibrillation  No Known Allergies  Patient Measurements: Height: 5\' 11"  (180.3 cm) Weight: 73 kg (160 lb 15 oz) IBW/kg (Calculated) : 75.3 HEPARIN DW (KG): 73  Vital Signs: Temp: 98.1 F (36.7 C) (04/11 0525) Temp Source: Oral (04/11 0525) BP: 121/68 (04/11 0525) Pulse Rate: 71 (04/11 0525)  Labs: Recent Labs    07/28/23 2125 07/28/23 2200 07/29/23 0836 07/29/23 0912 07/30/23 0339  HGB 14.0  --   --  14.2 12.9*  HCT 42.9  --   --  43.3 40.6  PLT 129*  --   --  124* 120*  LABPROT  --  25.1* 22.9*  --  22.2*  INR  --  2.2* 2.0*  --  1.9*  CREATININE 1.27*  --   --  1.15 1.00    Estimated Creatinine Clearance: 45.6 mL/min (by C-G formula based on SCr of 1 mg/dL).   Medical History: Past Medical History:  Diagnosis Date   BPH (benign prostatic hypertrophy)    Chronic atrial fibrillation [facility])    cardiologist--   dr berry   COPD (chronic obstructive pulmonary disease) (HCC)    Diverticulosis of colon    MODERATE LEFT SIDE   Epididymal cyst    w/ epididymalitis   Full dentures    Gout    Hand foot syndrome    secondary to chemotherapy (Xeloda)   cold hands/feet   Hearing loss    History of alcohol abuse    quit drinking in the mid 80's   History of cirrhosis of liver    alcoholic--  hx alcohol abuse -- quit drinking 1980's   History of gout    History of melanoma excision    2013--  left ear lobe and back of hand   History of pancreatitis    2008   History of rectal cancer oncologist-  dr Truett Perna--  no recurrence   dx Sept 2009--  Stage II (T3N0)  s/p  sigmoid colectomy & low anterior resection 04-18-2008  and chemoradiation 2010   History of shingles 07/27/2010   History of squamous cell carcinoma excision    left lower leg   Hyperlipidemia    Hypertension    Loose stools    DUE TO ANTIBIOTICS   Macular degeneration    not sure which eye   OA (osteoarthritis)     RBBB (right bundle branch block)    Renal lesion    chronic-- left side   Sciatica of right side    Urge urinary incontinence    intermittant    Medications:  - Per [facility] clinic on 4/2, warfarin regimen is 3.75 mg on Wed & Sat and 2.5 mg all other days. - Per pt's daughter Belenda Cruise), pt manages his own medications at home. She's not sure if he has been taking the warfarin regimen as recommended by the [facility] clinic for the past week due to his mentation.  Assessment: Pt is a 89 yo male with hx thrombocytopenia, afib on warfarin PTA, stage III CKD, who presented to the ED on 07/28/23 with c/o fevers, weakness, and intermittent confusion episodes. Head CT no significant finding. Pharmacy has been consulted to manage warfarin dosing.  Today, 07/30/2023: - INR 1.9 - subtherapeutic - Hgb 12.9, plts down to 120 (baseline plts 130-140s) - No bleeding but bruises still present per RN - DDIs: being on abx can make pt sensitive to warfarin, pt currently on ceftriaxone and azithromycin  Goal of Therapy:  INR 2-3 Monitor platelets by anticoagulation protocol: Yes   Plan:  - Warfarin 4 mg PO x1 - Daily CBC and INR - Monitor for s/sx bleeding  Tory Emerald, PharmD Candidate 07/30/2023,7:45 AM

## 2023-07-30 NOTE — Discharge Summary (Signed)
 Physician Discharge Summary  Meer Reindl VWU:[id number] DOB: 1925/06/24 DOA: 07/28/2023  PCP: Shelva Majestic, MD  Admit date: 07/28/2023 Discharge date: 07/30/2023  Admitted From: home Disposition:  home  Recommendations for Outpatient Follow-up:  Follow up with PCP in 1-2 weeks  Home Health: none Equipment/Devices: none  Discharge Condition: stable CODE STATUS: Full code  Brief Narrative:  89 year old with history of BPH, chronic A-fib on Coumadin, COPD, HTN, HLD admitted for confusion, fevers and chills.  Upon admission CT head negative, chest x-ray showed concerns of possible left lower lobe opacity, COVID, RSV, flu was negative.  UA was not necessarily impressive.  There is concerns of Candida of his penile area/onychomycosis.  Given the's of empiric Rocephin  Hospital Course / Discharge diagnoses: Principal Problem:   Acute metabolic encephalopathy Active Problems:   Leukocytosis   Fever   Essential hypertension   Atrial fibrillation (HCC)   Chronic kidney disease (CKD), stage III (moderate) (HCC)   PAD (peripheral artery disease) (HCC)   Diabetes mellitus type 2, controlled (HCC)   UTI (urinary tract infection)  Principal problem Acute metabolic encephalopathy, fever, leukocytosis, possible pneumonia -  No glaring source of infection, however CXR showed possible infiltrate and he had an elevated WBC at 18K on admission. He was placed on Ceftriaxone and azithromycin, improved significantly, mental status has returned to baseline and currently is AxOx4, confirmed by family. His leukocytosis is resolving. Given rapid improvement, will be discharged home on empiric po antibiotics for few additional days.   Active problems  Candida penile area/onychomycosis of his feet -  Continue topical antifungal.  Was on Lamisil prior to arrival Hypokalemia, hypophosphatemia  - improved. Replaced  Essential hypertension - continue home medications BPH - Flomax Chronic atrial  fibrillation on Coumadin - stable, continue home medications  Sepsis ruled out   Discharge Instructions   Allergies as of 07/30/2023   No Known Allergies      Medication List     STOP taking these medications    cephALEXin 500 MG capsule Commonly known as: KEFLEX       TAKE these medications    acetaminophen 500 MG tablet Commonly known as: TYLENOL Take 1,000 mg by mouth every 6 (six) hours as needed for moderate pain (pain score 4-6).   albuterol 108 (90 Base) MCG/ACT inhaler Commonly known as: VENTOLIN HFA Inhale 2 puffs into the lungs every 6 (six) hours as needed for wheezing or shortness of breath.   allopurinol 300 MG tablet Commonly known as: ZYLOPRIM TAKE ONE-HALF TABLET BY MOUTH IN THE EVENING   amLODipine 5 MG tablet Commonly known as: NORVASC Take 1 tablet (5 mg total) by mouth daily. For blood pressure and possible Raynauds   doxycycline 100 MG capsule Commonly known as: VIBRAMYCIN Take 1 capsule (100 mg total) by mouth 2 (two) times daily for 4 days.   fluocinonide 0.05 % external solution Commonly known as: LIDEX APPLY 1 ML TO AFFECTED AREAS OF SCALP AND CHEST NIGHTLY What changed: See the new instructions.   fluticasone 50 MCG/ACT nasal spray Commonly known as: FLONASE Place 2 sprays into both nostrils daily. What changed:  when to take this reasons to take this   furosemide 20 MG tablet Commonly known as: LASIX Take 1 tablet (20 mg total) by mouth daily as needed for edema or fluid.   ketoconazole 2 % cream Commonly known as: NIZORAL Apply 1 Application topically 2 (two) times daily. For up to 2 weeks   multivitamin tablet Take 1  tablet by mouth daily.   tamsulosin 0.4 MG Caps capsule Commonly known as: FLOMAX Take 1 capsule (0.4 mg total) by mouth daily. What changed: when to take this   terbinafine 250 MG tablet Commonly known as: LAMISIL TAKE 1 TABLET (250 MG TOTAL) BY MOUTH DAILY. FOR TOENAIL FUNGUS/ONYCHOMYCOSIS    triamterene-hydrochlorothiazide 37.5-25 MG tablet Commonly known as: MAXZIDE-25 TAKE 1/2 TABLET BY MOUTH DAILY What changed:  when to take this additional instructions   warfarin 2.5 MG tablet Commonly known as: COUMADIN Take as directed. If you are unsure how to take this medication, talk to your nurse or doctor. Original instructions: TAKE 1 TABLET BY MOUTH DAILY OR AS DIRECTED BY ANTICOAGULATION CLINIC What changed:  how much to take how to take this when to take this additional instructions   zolpidem 5 MG tablet Commonly known as: AMBIEN TAKE 1 TABLET BY MOUTH EVERY DAY AT BEDTIME AS NEEDED FOR SLEEP What changed: See the new instructions.       Consultations: none  Procedures/Studies:  CT Head Wo Contrast Result Date: 07/29/2023 CLINICAL DATA:  Altered mental status loss of appetite, new AMS, and weakness that started today. Pt was recently diagnosed with UTI, cystitis and infection of foreskin, started on keflex today. Reports "not feeling himself today". EXAM: CT HEAD WITHOUT CONTRAST TECHNIQUE: Contiguous axial images were obtained from the base of the skull through the vertex without intravenous contrast. RADIATION DOSE REDUCTION: This exam was performed according to the departmental dose-optimization program which includes automated exposure control, adjustment of the mA and/or kV according to patient size and/or use of iterative reconstruction technique. COMPARISON:  MRI head 01/22/2022 trauma CT head 04/01/2023 FINDINGS: Brain: Patchy and confluent areas of decreased attenuation are noted throughout the deep and periventricular white matter of the cerebral hemispheres bilaterally, compatible with chronic microvascular ischemic disease. No evidence of large-territorial acute infarction. No parenchymal hemorrhage. No mass lesion. No extra-axial collection. No mass effect or midline shift. No hydrocephalus. Basilar cisterns are patent. Vascular: No hyperdense vessel.  Atherosclerotic calcifications are present within the cavernous internal carotid arteries. Skull: No acute fracture or focal lesion. Sinuses/Orbits: Paranasal sinuses and mastoid air cells are clear. The orbits are unremarkable. Other: None. IMPRESSION: No acute intracranial abnormality. Electronically Signed   By: Tish Frederickson M.D.   On: 07/29/2023 00:25   DG Chest Port 1 View Result Date: 07/29/2023 CLINICAL DATA:  Altered mental status EXAM: PORTABLE CHEST 1 VIEW COMPARISON:  Chest x-ray 01/10/11, CT chest 04/01/2023 FINDINGS: Patient is rotated. The heart and mediastinal contours are unchanged. Atherosclerotic plaque. Left lower lobe rounded airspace opacity. No pulmonary edema. No pleural effusion. No pneumothorax. No acute osseous abnormality. IMPRESSION: 1. Left lower lobe rounded airspace opacity may correlate to pulmonary nodule noted on CT chest 04/01/2023. Consider repeat chest x-ray PA and lateral view for further evaluation. 2.  Aortic Atherosclerosis (ICD10-I70.0). Electronically Signed   By: Tish Frederickson M.D.   On: 07/29/2023 00:22     Subjective: - no chest pain, shortness of breath, no abdominal pain, nausea or vomiting.   Discharge Exam: BP 121/70   Pulse 71   Temp 98.1 F (36.7 C) (Oral)   Resp 18   Ht 5\' 11"  (1.803 m)   Wt 73 kg   SpO2 94%   BMI 22.45 kg/m   General: Pt is alert, awake, not in acute distress Cardiovascular: RRR, S1/S2 +, no rubs, no gallops Respiratory: CTA bilaterally, no wheezing, no rhonchi Abdominal: Soft, NT, ND, bowel sounds +  Extremities: no edema, no cyanosis    The results of significant diagnostics from this hospitalization (including imaging, microbiology, ancillary and laboratory) are listed below for reference.     Microbiology: Recent Results (from the past 240 hours)  Culture, blood (Routine X 2) w Reflex to ID Panel     Status: None (Preliminary result)   Collection Time: 07/29/23  1:40 AM   Specimen: BLOOD  Result Value  Ref Range Status   Specimen Description   Final    BLOOD RIGHT ANTECUBITAL Performed at [facility], [address], [place], [place] [address]    Special Requests   Final    BOTTLES DRAWN AEROBIC ONLY Blood Culture results may not be optimal due to an inadequate volume of blood received in culture bottles Performed at [facility], [address] [place], [place] [address]    Culture   Final    NO GROWTH 1 DAY Performed at [facility], [address], [place], [place] [address]    Report Status PENDING  Incomplete  Resp panel by RT-PCR (RSV, Flu A&B, Covid) Anterior Nasal Swab     Status: None   Collection Time: 07/29/23  1:51 AM   Specimen: Anterior Nasal Swab  Result Value Ref Range Status   SARS Coronavirus 2 by RT PCR NEGATIVE NEGATIVE Final    Comment: (NOTE) SARS-CoV-2 target nucleic acids are NOT DETECTED.  The SARS-CoV-2 RNA is generally detectable in upper respiratory specimens during the acute phase of infection. The lowest concentration of SARS-CoV-2 viral copies this assay can detect is 138 copies/mL. A negative result does not preclude SARS-Cov-2 infection and should not be used as the sole basis for treatment or other patient management decisions. A negative result may occur with  improper specimen collection/handling, submission of specimen other than nasopharyngeal swab, presence of viral mutation(s) within the areas targeted by this assay, and inadequate number of viral copies(<138 copies/mL). A negative result must be combined with clinical observations, patient history, and epidemiological information. The expected result is Negative.  Fact Sheet for Patients:  BloggerCourse.com  Fact Sheet for Healthcare Providers:  SeriousBroker.it  This test is no t yet approved or cleared by the [place] FDA and  has been authorized for detection and/or diagnosis of  SARS-CoV-2 by FDA under an Emergency Use Authorization (EUA). This EUA will remain  in effect (meaning this test can be used) for the duration of the COVID-19 declaration under Section 564(b)(1) of the Act, 21 U.S.C.section 360bbb-3(b)(1), unless the authorization is terminated  or revoked sooner.       Influenza A by PCR NEGATIVE NEGATIVE Final   Influenza B by PCR NEGATIVE NEGATIVE Final    Comment: (NOTE) The Xpert Xpress SARS-CoV-2/FLU/RSV plus assay is intended as an aid in the diagnosis of influenza from Nasopharyngeal swab specimens and should not be used as a sole basis for treatment. Nasal washings and aspirates are unacceptable for Xpert Xpress SARS-CoV-2/FLU/RSV testing.  Fact Sheet for Patients: BloggerCourse.com  Fact Sheet for Healthcare Providers: SeriousBroker.it  This test is not yet approved or cleared by the [place] FDA and has been authorized for detection and/or diagnosis of SARS-CoV-2 by FDA under an Emergency Use Authorization (EUA). This EUA will remain in effect (meaning this test can be used) for the duration of the COVID-19 declaration under Section 564(b)(1) of the Act, 21 U.S.C. section 360bbb-3(b)(1), unless the authorization is terminated or revoked.     Resp Syncytial Virus by  PCR NEGATIVE NEGATIVE Final    Comment: (NOTE) Fact Sheet for Patients: BloggerCourse.com  Fact Sheet for Healthcare Providers: SeriousBroker.it  This test is not yet approved or cleared by the [place] FDA and has been authorized for detection and/or diagnosis of SARS-CoV-2 by FDA under an Emergency Use Authorization (EUA). This EUA will remain in effect (meaning this test can be used) for the duration of the COVID-19 declaration under Section 564(b)(1) of the Act, 21 U.S.C. section 360bbb-3(b)(1), unless the authorization is terminated  or revoked.  Performed at [facility], [address] [place], [place] [address]   Culture, blood (Routine X 2) w Reflex to ID Panel     Status: None (Preliminary result)   Collection Time: 07/29/23  1:51 AM   Specimen: BLOOD  Result Value Ref Range Status   Specimen Description   Final    BLOOD LEFT ANTECUBITAL Performed at [facility], [address] [place], [place] [address]    Special Requests   Final    BOTTLES DRAWN AEROBIC AND ANAEROBIC Blood Culture results may not be optimal due to an inadequate volume of blood received in culture bottles Performed at [facility], [address], [place], [place] [address]    Culture   Final    NO GROWTH 1 DAY Performed at [facility], [address] [place], [place] [address]    Report Status PENDING  Incomplete  Respiratory (~20 pathogens) panel by PCR     Status: None   Collection Time: 07/29/23  9:03 AM   Specimen: Nasopharyngeal Swab; Respiratory  Result Value Ref Range Status   Adenovirus NOT DETECTED NOT DETECTED Final   Coronavirus 229E NOT DETECTED NOT DETECTED Final    Comment: (NOTE) The Coronavirus on the Respiratory Panel, DOES NOT test for the novel  Coronavirus (2019 nCoV)    Coronavirus HKU1 NOT DETECTED NOT DETECTED Final   Coronavirus NL63 NOT DETECTED NOT DETECTED Final   Coronavirus OC43 NOT DETECTED NOT DETECTED Final   Metapneumovirus NOT DETECTED NOT DETECTED Final   Rhinovirus / Enterovirus NOT DETECTED NOT DETECTED Final   Influenza A NOT DETECTED NOT DETECTED Final   Influenza B NOT DETECTED NOT DETECTED Final   Parainfluenza Virus 1 NOT DETECTED NOT DETECTED Final   Parainfluenza Virus 2 NOT DETECTED NOT DETECTED Final   Parainfluenza Virus 3 NOT DETECTED NOT DETECTED Final   Parainfluenza Virus 4 NOT DETECTED NOT DETECTED Final   Respiratory Syncytial Virus NOT DETECTED NOT DETECTED Final   Bordetella pertussis NOT DETECTED NOT  DETECTED Final   Bordetella Parapertussis NOT DETECTED NOT DETECTED Final   Chlamydophila pneumoniae NOT DETECTED NOT DETECTED Final   Mycoplasma pneumoniae NOT DETECTED NOT DETECTED Final    Comment: Performed at [facility], [address], [place], [place] [address]  Urine Culture (for pregnant, neutropenic or urologic patients or patients with an indwelling urinary catheter)     Status: None   Collection Time: 07/29/23  9:03 AM   Specimen: Urine, Clean Catch  Result Value Ref Range Status   Specimen Description   Final    URINE, CLEAN CATCH Performed at [facility], [address] [place], [place] [address]    Special Requests   Final    Normal Performed at [facility], [address], [place], [place] [address]    Culture   Final    NO GROWTH Performed at [facility],  [address], [place], [place] [address]    Report Status 07/30/2023 FINAL  Final     Labs: Basic Metabolic Panel: Recent Labs  Lab 07/28/23 1245 07/28/23 2125 07/29/23 0912 07/30/23 0339  NA 138 136 134* 134*  K 3.5 3.0* 3.7 3.5  CL 95* 97* 99 103  CO2 33* 28 25 25   GLUCOSE 148* 124* 134* 106*  BUN 32* 31* 30* 25*  CREATININE 1.34 1.27* 1.15 1.00  CALCIUM 9.8 8.8* 8.5* 8.1*  MG  --  2.0 2.0 2.2  PHOS  --   --  1.7* 2.3*   Liver Function Tests: Recent Labs  Lab 07/28/23 1245 07/28/23 2125  AST 26 26  ALT 15 17  ALKPHOS 71 57  BILITOT 2.0* 1.7*  PROT 7.2 6.3*  ALBUMIN 4.4 3.3*   CBC: Recent Labs  Lab 07/28/23 1245 07/28/23 2125 07/29/23 0912 07/30/23 0339  WBC 18.2 Repeated and verified X2.* 18.2* 16.0* 11.7*  NEUTROABS 16.0* 15.2*  --   --   HGB 15.2 14.0 14.2 12.9*  HCT 46.0 42.9 43.3 40.6  MCV 92.9 93.9 94.1 98.8  PLT 145.0* 129* 124* 120*   CBG: Recent Labs  Lab 07/28/23 2033  GLUCAP 119*   Hgb A1c No results for input(s): "HGBA1C" in the last 72 hours. Lipid Profile No results for input(s): "CHOL", "HDL",  "LDLCALC", "TRIG", "CHOLHDL", "LDLDIRECT" in the last 72 hours. Thyroid function studies Recent Labs    07/29/23 0912  TSH 3.040   Urinalysis    Component Value Date/Time   COLORURINE YELLOW 07/28/2023 2200   APPEARANCEUR CLEAR 07/28/2023 2200   LABSPEC 1.017 07/28/2023 2200   PHURINE 7.0 07/28/2023 2200   GLUCOSEU NEGATIVE 07/28/2023 2200   GLUCOSEU NEGATIVE 07/28/2023 1216   HGBUR NEGATIVE 07/28/2023 2200   BILIRUBINUR NEGATIVE 07/28/2023 2200   BILIRUBINUR positive 04/22/2021 1346   KETONESUR NEGATIVE 07/28/2023 2200   PROTEINUR 100 (A) 07/28/2023 2200   UROBILINOGEN 1.0 07/28/2023 1216   NITRITE NEGATIVE 07/28/2023 2200   LEUKOCYTESUR NEGATIVE 07/28/2023 2200    FURTHER DISCHARGE INSTRUCTIONS:   Get Medicines reviewed and adjusted: Please take all your medications with you for your next visit with your Primary MD   Laboratory/radiological data: Please request your Primary MD to go over all hospital tests and procedure/radiological results at the follow up, please ask your Primary MD to get all Hospital records sent to his/her office.   In some cases, they will be blood work, cultures and biopsy results pending at the time of your discharge. Please request that your primary care M.D. goes through all the records of your hospital data and follows up on these results.   Also Note the following: If you experience worsening of your admission symptoms, develop shortness of breath, life threatening emergency, suicidal or homicidal thoughts you must seek medical attention immediately by calling 911 or calling your MD immediately  if symptoms less severe.   You must read complete instructions/literature along with all the possible adverse reactions/side effects for all the Medicines you take and that have been prescribed to you. Take any new Medicines after you have completely understood and accpet all the possible adverse reactions/side effects.    Do not drive when taking Pain  medications or sleeping medications (Benzodaizepines)   Do not take more than prescribed Pain, Sleep and Anxiety Medications. It is not advisable to combine anxiety,sleep and pain medications without talking with your primary care practitioner   Special Instructions: If you have smoked or chewed  Tobacco  in the last 2 yrs please stop smoking, stop any regular Alcohol  and or any Recreational drug use.   Wear Seat belts while driving.   Please note: You were cared for by a hospitalist during your hospital stay. Once you are discharged, your primary care physician will handle any further medical issues. Please note that NO REFILLS for any discharge medications will be authorized once you are discharged, as it is imperative that you return to your primary care physician (or establish a relationship with a primary care physician if you do not have one) for your post hospital discharge needs so that they can reassess your need for medications and monitor your lab values.  Time coordinating discharge: 35 minutes  SIGNED:  Pamella Pert, MD, PhD 07/30/2023, 12:23 PM

## 2023-07-30 NOTE — Plan of Care (Signed)

## 2023-07-30 NOTE — TOC Transition Note (Signed)
 Transition of Care [facility]) - Discharge Note   Patient Details  Name: William Mahoney MRN: [id number] Date of Birth: June 02, 1925  [facility] CM/SW Contact:  Jessie Foot, RN Phone Number: 07/30/2023, 3:50 PM   Clinical Narrative:    Patient and patients daughter agreed to [facility] PT. Choice given. Asked to send to multiple agencies. [facility] accepted and added to AVS. TOC signing off.   Final next level of care: Home w Home Health Services Barriers to Discharge: Barriers Resolved   Patient Goals and CMS Choice Patient states their goals for this hospitalization and ongoing recovery are:: Return to home with daughter CMS Medicare.gov Compare Post Acute Care list provided to:: Patient Represenative (must comment) (Daughter) Choice offered to / list presented to : Adult Children      Discharge Placement                       Discharge Plan and Services Additional resources added to the After Visit Summary for   In-house Referral: NA Discharge Planning Services: CM Consult Post Acute Care Choice: NA          DME Arranged: N/A DME Agency: NA       HH Arranged: PT HH Agency:  ([facility]) Date HH Agency Contacted: 07/30/23 Time HH Agency Contacted: 1535 Representative spoke with at [facility] Agency: Lannette Donath  Social Drivers of Health (SDOH) Interventions SDOH Screenings   Food Insecurity: No Food Insecurity (07/29/2023)  Housing: Unknown (07/29/2023)  Transportation Needs: No Transportation Needs (07/29/2023)  Utilities: Not At Risk (07/29/2023)  Depression (PHQ2-9): Low Risk  (07/28/2023)  Physical Activity: Inactive (12/26/2021)  Social Connections: Moderately Isolated (07/29/2023)  Tobacco Use: Medium Risk (07/29/2023)     Readmission Risk Interventions     No data to display

## 2023-07-30 NOTE — Progress Notes (Signed)
 PT Cancellation Note  Patient Details Name: William Mahoney MRN: [id number] DOB: 09/13/1925   Cancelled Treatment:    Reason Eval/Treat Not Completed: Other (comment) Pt with d/c orders, nurse confirmed pt discharging.  Pt has DME and getting HHPT set up.  Also noted ambulated 200' earlier with mobility team. Spoke with pt and daughter who confirm discharging, have DME, has supervision, getting HH, and felt confident moving earlier.  Request to rest at this time as he is leaving shortly.  Daughter asked about exercises at home - discussed walking with supervision and sit to stands. Will f/u tomorrow if pt does not d/c.  Anise Salvo, PT Acute Rehab [facility] [phone]   Rayetta Humphrey 07/30/2023, 3:37 PM

## 2023-07-30 NOTE — Progress Notes (Signed)
 Mobility Specialist - Progress Note   07/30/23 1157  Mobility  Activity Ambulated with assistance in hallway  Level of Assistance Contact guard assist, steadying assist  Assistive Device Front wheel walker  Distance Ambulated (ft) 200 ft  Range of Motion/Exercises Active  Activity Response Tolerated well  Mobility Referral Yes  Mobility visit 1 Mobility  Mobility Specialist Start Time (ACUTE ONLY) 1145  Mobility Specialist Stop Time (ACUTE ONLY) 1157  Mobility Specialist Time Calculation (min) (ACUTE ONLY) 12 min   Pt was found in bed and agreeable to ambulate. No complaints with session. At EOS returned to recliner chair with all needs met. Call bell in reach and family in room. RN notified.  Billey Chang Mobility Specialist

## 2023-07-31 LAB — URINE CULTURE
MICRO NUMBER:: 16308695
SPECIMEN QUALITY:: ADEQUATE

## 2023-08-02 ENCOUNTER — Telehealth: Payer: Self-pay | Admitting: *Deleted

## 2023-08-02 NOTE — Transitions of Care (Post Inpatient/ED Visit) (Signed)
 08/02/2023  Name: William Mahoney MRN: [id number] DOB: Aug 02, 1925  Today's TOC FU Call Status: Today's TOC FU Call Status:: Successful TOC FU Call Completed TOC FU Call Complete Date: 08/02/23 Patient's Name and Date of Birth confirmed.  Transition Care Management Follow-up Telephone Call Date of Discharge: 07/30/23 Discharge Facility: [facility]) Type of Discharge: Inpatient Admission Primary Inpatient Discharge Diagnosis:: Acute metabolic encephalopathy How have you been since you were released from the hospital?:  (eating, drinking well, using walker for ambulation, working with home health PT) Any questions or concerns?: No  Items Reviewed: Did you receive and understand the discharge instructions provided?: Yes Medications obtained,verified, and reconciled?: Yes (Medications Reviewed) Any new allergies since your discharge?: No Dietary orders reviewed?: Yes Type of Diet Ordered:: heart healthy Do you have support at home?: Yes People in Home [RPT]: child(ren), adult Name of Support/Comfort Primary Source: daughter William Mahoney Reviewed signs/ symptoms of infection, reportable signs/ symptoms Reviewed importance of continuing to work with home health PT and complete prescribed exercises  Medications Reviewed Today: Medications Reviewed Today     Reviewed by Audrie Gallus, RN (Registered Nurse) on 08/02/23 at 1018  Med List Status: <None>   Medication Order Taking? Sig Documenting Provider Last Dose Status Informant  acetaminophen (TYLENOL) 500 MG tablet [id number] Yes Take 1,000 mg by mouth every 6 (six) hours as needed for moderate pain (pain score 4-6). [provider] Taking Active Child, Pharmacy Records  albuterol (VENTOLIN HFA) 108 (90 Base) MCG/ACT inhaler [id number] Yes Inhale 2 puffs into the lungs every 6 (six) hours as needed for wheezing or shortness of breath. Shelva Majestic, MD Taking Active Child, Pharmacy Records  allopurinol (ZYLOPRIM) 300  MG tablet [id number] Yes TAKE ONE-HALF TABLET BY MOUTH IN THE Gretta Arab, MD Taking Active Child, Pharmacy Records  amLODipine (NORVASC) 5 MG tablet [id number] Yes Take 1 tablet (5 mg total) by mouth daily. For blood pressure and possible Raynauds Shelva Majestic, MD Taking Active Child, Pharmacy Records  doxycycline (VIBRAMYCIN) 100 MG capsule [id number] Yes Take 1 capsule (100 mg total) by mouth 2 (two) times daily for 4 days. Leatha Gilding, MD Taking Active   fluocinonide (LIDEX) 0.05 % external solution [id number] Yes APPLY 1 ML TO AFFECTED AREAS OF SCALP AND CHEST NIGHTLY  Patient taking differently: Apply 1 Application topically every other day. APPLY 1 ML TO AFFECTED AREAS OF SCALP AND CHEST   Shelva Majestic, MD Taking Active Child, Pharmacy Records  fluticasone [facility]) 50 MCG/ACT nasal spray [id number] Yes Place 2 sprays into both nostrils daily.  Patient taking differently: Place 2 sprays into both nostrils daily as needed for allergies.   Shelva Majestic, MD Taking Active Child, Pharmacy Records           Med Note Roxan Hockey, Simonne Martinet   Thu Jul 29, 2023  8:41 AM)    furosemide (LASIX) 20 MG tablet [id number] Yes Take 1 tablet (20 mg total) by mouth daily as needed for edema or fluid. Shelva Majestic, MD Taking Active Child, Pharmacy Records  ketoconazole (NIZORAL) 2 % cream [id number] No Apply 1 Application topically 2 (two) times daily. For up to 2 weeks  Patient not taking: Reported on 07/29/2023   Shelva Majestic, MD Not Taking Active Child, Pharmacy Records  Multiple Vitamin (MULTIVITAMIN) tablet [id number] No Take 1 tablet by mouth daily.  Patient not taking: Reported on 08/02/2023   [provider] Not Taking Active Child, Pharmacy Records  tamsulosin (  FLOMAX) 0.4 MG CAPS capsule [id number] Yes Take 1 capsule (0.4 mg total) by mouth daily.  Patient taking differently: Take 0.4 mg by mouth at bedtime.   Almira Jaeger, MD Taking Active Child, Pharmacy  Records  terbinafine (LAMISIL) 250 MG tablet [id number] No TAKE 1 TABLET (250 MG TOTAL) BY MOUTH DAILY. FOR TOENAIL FUNGUS/ONYCHOMYCOSIS  Patient not taking: Reported on 07/29/2023   Almira Jaeger, MD Not Taking Active Child, Pharmacy Records  triamterene-hydrochlorothiazide [facility]) 37.5-25 MG tablet [id number] Yes TAKE 1/2 TABLET BY MOUTH DAILY  Patient taking differently: Take 0.5 tablets by mouth See admin instructions. 1 tablet every day and on Wednesdays - takes 1.5 tablet.   Almira Jaeger, MD Taking Active Child, Pharmacy Records  warfarin (COUMADIN) 2.5 MG tablet [id number] Yes TAKE 1 TABLET BY MOUTH DAILY OR AS DIRECTED BY ANTICOAGULATION CLINIC  Patient taking differently: Take 2.5-3.75 mg by mouth See admin instructions. Take 2.5mg  (1 tablet) by mouth daily except for Wednesday's and Saturday's. On Wedesday's and Saturday's, take 3.75mg  (1 and 1/2 tablet).   Almira Jaeger, MD Taking Active Child, Pharmacy Records           Med Note Verdia Glad, Ledon Pry   Thu Jul 29, 2023  8:54 AM) @Lunchtime   zolpidem (AMBIEN) 5 MG tablet [id number] Yes TAKE 1 TABLET BY MOUTH EVERY DAY AT BEDTIME AS NEEDED FOR SLEEP  Patient taking differently: Take 5 mg by mouth at bedtime.   Almira Jaeger, MD Taking Active Child, Pharmacy Records            Home Care and Equipment/Supplies: Were Home Health Services Ordered?: Yes Name of Home Health Agency:: [facility] Has Agency set up a time to come to your home?: Yes First Home Health Visit Date: 07/30/23  Functional Questionnaire: Do you need assistance with bathing/showering or dressing?: Yes (shower seat) Do you need assistance with meal preparation?:  (daughter assists) Do you need assistance with eating?: No Do you have difficulty maintaining continence: No Do you need assistance with getting out of bed/getting out of a chair/moving?:  (uses walker) Do you have difficulty managing or taking your medications?:  (daughter  provides oversight)  Follow up appointments reviewed: PCP Follow-up appointment confirmed?: Yes Date of PCP follow-up appointment?: 08/09/23 Follow-up Provider: Dr. Clarisa Crooked  @ 1120 am [facility] Follow-up appointment confirmed?: NA Do you need transportation to your follow-up appointment?: No Do you understand care options if your condition(s) worsen?: Yes-patient verbalized understanding  SDOH Interventions Today    Flowsheet Row Most Recent Value  SDOH Interventions   Food Insecurity Interventions Intervention Not Indicated  Housing Interventions Intervention Not Indicated  Transportation Interventions Intervention Not Indicated  Utilities Interventions Intervention Not Indicated       Cecilie Coffee [facility], BSN RN Care Manager/ Transition of Care [place]/ [facility] Population [facility] [phone]

## 2023-08-03 LAB — CULTURE, BLOOD (ROUTINE X 2)
Culture: NO GROWTH
Culture: NO GROWTH

## 2023-08-04 ENCOUNTER — Encounter: Payer: Self-pay | Admitting: Family Medicine

## 2023-08-08 ENCOUNTER — Other Ambulatory Visit: Payer: Self-pay | Admitting: Family Medicine

## 2023-08-09 ENCOUNTER — Ambulatory Visit: Admitting: Family Medicine

## 2023-08-09 ENCOUNTER — Encounter: Payer: Self-pay | Admitting: Family Medicine

## 2023-08-09 VITALS — BP 110/62 | HR 49 | Temp 98.5°F | Resp 16 | Ht 71.0 in | Wt 155.0 lb

## 2023-08-09 DIAGNOSIS — N3090 Cystitis, unspecified without hematuria: Secondary | ICD-10-CM | POA: Diagnosis not present

## 2023-08-09 DIAGNOSIS — G4700 Insomnia, unspecified: Secondary | ICD-10-CM

## 2023-08-09 DIAGNOSIS — Z8669 Personal history of other diseases of the nervous system and sense organs: Secondary | ICD-10-CM | POA: Diagnosis not present

## 2023-08-09 DIAGNOSIS — I4891 Unspecified atrial fibrillation: Secondary | ICD-10-CM

## 2023-08-09 DIAGNOSIS — I10 Essential (primary) hypertension: Secondary | ICD-10-CM | POA: Diagnosis not present

## 2023-08-09 DIAGNOSIS — B3742 Candidal balanitis: Secondary | ICD-10-CM | POA: Diagnosis not present

## 2023-08-09 DIAGNOSIS — J189 Pneumonia, unspecified organism: Secondary | ICD-10-CM | POA: Diagnosis not present

## 2023-08-09 LAB — CBC WITH DIFFERENTIAL/PLATELET
Basophils Absolute: 0 10*3/uL (ref 0.0–0.1)
Basophils Relative: 0.5 % (ref 0.0–3.0)
Eosinophils Absolute: 0.1 10*3/uL (ref 0.0–0.7)
Eosinophils Relative: 0.8 % (ref 0.0–5.0)
HCT: 42.4 % (ref 39.0–52.0)
Hemoglobin: 14.1 g/dL (ref 13.0–17.0)
Lymphocytes Relative: 11.1 % — ABNORMAL LOW (ref 12.0–46.0)
Lymphs Abs: 0.9 10*3/uL (ref 0.7–4.0)
MCHC: 33.4 g/dL (ref 30.0–36.0)
MCV: 92.8 fl (ref 78.0–100.0)
Monocytes Absolute: 0.8 10*3/uL (ref 0.1–1.0)
Monocytes Relative: 9.8 % (ref 3.0–12.0)
Neutro Abs: 6.5 10*3/uL (ref 1.4–7.7)
Neutrophils Relative %: 77.8 % — ABNORMAL HIGH (ref 43.0–77.0)
Platelets: 187 10*3/uL (ref 150.0–400.0)
RBC: 4.57 Mil/uL (ref 4.22–5.81)
RDW: 14.4 % (ref 11.5–15.5)
WBC: 8.4 10*3/uL (ref 4.0–10.5)

## 2023-08-09 LAB — COMPREHENSIVE METABOLIC PANEL WITH GFR
ALT: 12 U/L (ref 0–53)
AST: 20 U/L (ref 0–37)
Albumin: 4 g/dL (ref 3.5–5.2)
Alkaline Phosphatase: 54 U/L (ref 39–117)
BUN: 24 mg/dL — ABNORMAL HIGH (ref 6–23)
CO2: 34 meq/L — ABNORMAL HIGH (ref 19–32)
Calcium: 9.2 mg/dL (ref 8.4–10.5)
Chloride: 93 meq/L — ABNORMAL LOW (ref 96–112)
Creatinine, Ser: 1.28 mg/dL (ref 0.40–1.50)
GFR: 47.47 mL/min — ABNORMAL LOW (ref >60.00)
Glucose, Bld: 129 mg/dL — ABNORMAL HIGH (ref 70–99)
Potassium: 3.1 meq/L — ABNORMAL LOW (ref 3.5–5.1)
Sodium: 137 meq/L (ref 135–145)
Total Bilirubin: 0.7 mg/dL (ref 0.2–1.2)
Total Protein: 6.2 g/dL (ref 6.0–8.3)

## 2023-08-09 MED ORDER — APIXABAN 5 MG PO TABS: 5.0000 mg | ORAL_TABLET | Freq: Two times a day (BID) | ORAL | 5 refills | Status: DC

## 2023-08-09 MED ORDER — BELSOMRA 5 MG PO TABS: 5.0000 mg | ORAL_TABLET | Freq: Every evening | ORAL | 5 refills | Status: DC | PRN

## 2023-08-09 NOTE — Assessment & Plan Note (Signed)
 Family reports Coumadin  dosing has been more difficult for him as he is aged-we discussed possible alternate of Eliquis  5 mg twice daily with his weight in kilograms greater than 60 and creatinine under  1.5.  We sent in Eliquis  and he wants to change to this with the help of [facility] Coach and Coumadin  clinic if cost effective

## 2023-08-09 NOTE — Progress Notes (Signed)
 Phone [phone]   Subjective:  William Mahoney is a 89 y.o. year old very pleasant male patient who presents for transitional care management and hospital follow up for acute metabolic encephalopathy. Patient was hospitalized from 07/28/2023 to 07/30/2023. A TCM phone call was completed on 08/02/2023. Medical complexity moderate  Patient had presented to our office on 07/28/2023 with concerns for UTI as well as fatigue/generalized achiness/some confusion-flu and COVID test were negative.  Concern for UTI and was started on antibiotics with Keflex  (ultimately confirmed to have E. coli from 07/28/2023 culture with 50,000 to 100,000 colonies of E. coli which were resistant to Augmentin , ampicillin, ampicillin sulbactam but sensitive to cefazolin  and all cephalosporins as well as ciprofloxacin , imipenem, levofloxacin , nitrofurantoin, Bactrim) but cath urine culture did not show this - so possibility this was a false positive in office.  He also had candidal balanitis and was treated with topical medication.  CBC came back with elevated white blood count of 18,000 which was concerning.  Unfortunately patient had further decline and had to go to the emergency department later that day-chest x-ray showed possible infiltrate and he was placed on ceftriaxone  and azithromycin  from x-ray report "Left lower lobe rounded airspace opacity may correlate to pulmonary nodule noted on CT chest 04/01/2023. Consider repeat chest x-ray PA and lateral view for further evaluation.".  RSV was also negative in the hospital.  Blood cultures were negative.  Respiratory viral panel also negative. CT head without acute intracranial abnormality.   The ceftriaxone  would ultimately also cover UTI that was found in office on urine culture.  Sepsis was ruled out.  Leukocytosis improved during hospitalization and he had rapid improvement in metabolic encephalopathy and he was able to be discharged home with a few additional days of  antibiotics-doxycycline  but received 3 doses of ceftriaxone  before leaving the hospital. - If candidal balanitis he was continued on topical antifungal as well as restarted on Lamisil  at discharge for onychomycosis of fingernails - Did have hypokalemia and hypophosphatemia which were repleted - Hypertension was managed with home medications - BPH was managed on Flomax  and also important to prevent recurrent UTI - A-fib was managed with Coumadin    See problem oriented charting as well  Past Medical History-  Patient Active Problem List   Diagnosis Date Noted   Diabetes mellitus type 2, controlled (HCC) 03/24/2023    Priority: High   AAA (abdominal aortic aneurysm) without rupture 11/20/2019    Priority: High   Edema 11/10/2016    Priority: High   Atrial fibrillation (HCC) 12/07/2008    Priority: High   PAD (peripheral artery disease) (HCC) 11/20/2019    Priority: Medium    Chronic kidney disease (CKD), stage III (moderate) (HCC) 02/15/2019    Priority: Medium    Aortic atherosclerosis (HCC) 01/05/2019    Priority: Medium    Insomnia 10/19/2014    Priority: Medium    Gout 01/09/2014    Priority: Medium    History of cancer of rectosigmoid junction 01/04/2008    Priority: Medium    Former smoker 06/23/2007    Priority: Medium    Hyperlipidemia, unspecified 02/09/2007    Priority: Medium    Essential hypertension 12/07/2006    Priority: Medium    COPD (chronic obstructive pulmonary disease) (HCC) 12/07/2006    Priority: Medium    BPH (benign prostatic hyperplasia) 12/07/2006    Priority: Medium    Encounter for therapeutic drug monitoring 08/27/2014    Priority: Low   Allergic rhinitis 08/15/2013  Priority: Low   Arthritis of lumbar spine 01/20/2013    Priority: Low   Inguinal hernia 03/21/2010    Priority: Low   BASAL CELL CARCINOMA SKIN LOWER LIMB INCL HIP 03/21/2010    Priority: Low   Actinic keratosis 12/07/2008    Priority: Low   TMJ (temporomandibular joint  disorder) 02/09/2007    Priority: Low   URINARY INCONTINENCE 12/07/2006    Priority: Low   History of digestive system disease 12/07/2006    Priority: Low   Acute metabolic encephalopathy 07/29/2023   Leukocytosis 07/29/2023   Fever 07/29/2023   UTI (urinary tract infection) 07/29/2023   Porokeratosis 11/25/2021    Medications- reviewed and updated  A medical reconciliation was performed comparing current medicines to hospital discharge medications. Current Outpatient Medications  Medication Sig Dispense Refill   acetaminophen  (TYLENOL ) 500 MG tablet Take 1,000 mg by mouth every 6 (six) hours as needed for moderate pain (pain score 4-6).     albuterol  (VENTOLIN  HFA) 108 (90 Base) MCG/ACT inhaler Inhale 2 puffs into the lungs every 6 (six) hours as needed for wheezing or shortness of breath. 1 each 2   allopurinol  (ZYLOPRIM ) 300 MG tablet TAKE ONE-HALF TABLET BY MOUTH IN THE EVENING 45 tablet 3   amLODipine  (NORVASC ) 5 MG tablet Take 1 tablet (5 mg total) by mouth daily. For blood pressure and possible Raynauds 90 tablet 3   apixaban  (ELIQUIS ) 5 MG TABS tablet Take 1 tablet (5 mg total) by mouth 2 (two) times daily. See if affordable and if it is we will work with coumadin  clinic to transition to this. 60 tablet 5   fluocinonide  (LIDEX ) 0.05 % external solution APPLY 1 ML TO AFFECTED AREAS OF SCALP AND CHEST NIGHTLY (Patient taking differently: Apply 1 Application topically every other day. APPLY 1 ML TO AFFECTED AREAS OF SCALP AND CHEST) 60 mL 3   fluticasone  (FLONASE ) 50 MCG/ACT nasal spray Place 2 sprays into both nostrils daily. (Patient taking differently: Place 2 sprays into both nostrils daily as needed for allergies.) 48 g 3   furosemide  (LASIX ) 20 MG tablet Take 1 tablet (20 mg total) by mouth daily as needed for edema or fluid. 90 tablet 1   ketoconazole  (NIZORAL ) 2 % cream Apply 1 Application topically 2 (two) times daily. For up to 2 weeks 60 g 1   Multiple Vitamin (MULTIVITAMIN)  tablet Take 1 tablet by mouth daily.     Suvorexant  (BELSOMRA ) 5 MG TABS Take 1 tablet (5 mg total) by mouth at bedtime as needed. Trial in place of Ambien - if not effective we may switch back 30 tablet 5   tamsulosin  (FLOMAX ) 0.4 MG CAPS capsule Take 1 capsule (0.4 mg total) by mouth daily. (Patient taking differently: Take 0.4 mg by mouth at bedtime.) 90 capsule 3   terbinafine  (LAMISIL ) 250 MG tablet TAKE 1 TABLET (250 MG TOTAL) BY MOUTH DAILY. FOR TOENAIL FUNGUS/ONYCHOMYCOSIS 45 tablet 0   triamterene -hydrochlorothiazide (MAXZIDE-25) 37.5-25 MG tablet TAKE 1/2 TABLET BY MOUTH DAILY (Patient taking differently: Take 0.5 tablets by mouth See admin instructions. 1 tablet every day and on Wednesdays - takes 1.5 tablet.) 45 tablet 1   warfarin (COUMADIN ) 2.5 MG tablet TAKE 1 TABLET BY MOUTH DAILY OR AS DIRECTED BY ANTICOAGULATION CLINIC (Patient taking differently: Take 2.5-3.75 mg by mouth See admin instructions. Take 2.5mg  (1 tablet) by mouth daily except for Wednesday's and Saturday's. On Wedesday's and Saturday's, take 3.75mg  (1 and 1/2 tablet).) 110 tablet 1   No current facility-administered  medications for this visit.   Objective  Objective:  BP 110/62 (BP Location: Left Arm, Patient Position: Sitting)   Pulse (!) 49   Temp 98.5 F (36.9 C) (Temporal)   Resp 16   Ht 5\' 11"  (1.803 m)   Wt 155 lb (70.3 kg)   SpO2 97%   BMI 21.62 kg/m  Gen: NAD, resting comfortably CV: irregularly irregular  Lungs: CTAB no crackles, wheeze, rhonchi Ext: no edema Skin: warm, dry Penile exam-prior White plaques and erythema have resolved-circumcised penis even with retraction of foreskin now appears normal   Assessment and Plan:   # Hospital follow-up/TCM Assessment & Plan History of encephalopathy Patient with cephalopathy at most recent hospitalization-may have been related to pneumonia versus cystitis-had rapid improvement in the hospital with ceftriaxone  and azithromycin  and was later  transitioned to doxycycline  and he has finished his course.  He is working with physical therapy at home and I encouraged him to continue with this.  Recheck CBC and CMP.  Phosphorus was trending up and will hold off on repeat. - Likely repeat chest x-ray at follow-up in 4 to 6 weeks to make sure area not worsening with question of whether it corresponds to prior 20 mm solid lobular nodule in the left lower lobe from chest CT 04/01/2023 Community acquired pneumonia of left lower lobe of lung Appears to have resolved.  See discussion above-has completed doxycycline  and will repeat chest x-ray at follow-up Candidal balanitis Resolved on exam-discouraged use of triamcinolone  on the penis although it did seem to help rapidly improved the balanitis-would skick to topical antifungal only Cystitis I wonder if the urine culture we got was a false positive with his balanitis and potential difficulty of getting clean-catch that day in office versus catheterization not showing UTI in the hospital later that day.  Regardless he received several rounds of ceftriaxone  and would have been adequately treated - He is having urinary frequency still but could be related to diuretics and you are adjusting those Essential hypertension Furosemide  is listed as needed but he has been taking daily-advised him to stop unless having significant worsening swelling.  Move half tablet of triamterene  hydrochlorothiazide to the morning to see if that helps with nighttime urination issues Insomnia, unspecified type Long-term issues with insomnia even with Ambien  5 mg-family reports he has been taking an extra half or full tablet at 2 AM and is running out early-he then wakes up groggy in the morning (plus this could have contributed to encephalopathy).  We discussed potential memory/dementia related issues and alternative of Belsomra -he is willing to at least trial this Atrial fibrillation, unspecified type (HCC) Family reports  Coumadin  dosing has been more difficult for him as he is aged-we discussed possible alternate of Eliquis  5 mg twice daily with his weight in kilograms greater than 60 and creatinine under  1.5.  We sent in Eliquis  and he wants to change to this with the help of [facility] Coach and Coumadin  clinic if cost effective  Recommended follow up: Return in about 5 weeks (around 09/13/2023) for followup or sooner if needed.Schedule b4 you leave. Future Appointments  Date Time Provider Department Center  08/11/2023 10:00 AM [facility]INIC [facility]  09/07/2023  1:00 PM Almira Jaeger, MD [facility]  10/01/2023  3:15 PM [address] [facility] None    Lab/Order associations:   ICD-10-CM   1. History of encephalopathy  Z86.69     2. Community acquired pneumonia of left lower lobe of lung  J18.9  3. Candidal balanitis  B37.42     4. Cystitis  N30.90     5. Essential hypertension  I10 Comprehensive metabolic panel with GFR    CBC with Differential/Platelet    6. Insomnia, unspecified type  G47.00     7. Atrial fibrillation, unspecified type (HCC)  I48.91       Meds ordered this encounter  Medications   Suvorexant  (BELSOMRA ) 5 MG TABS    Sig: Take 1 tablet (5 mg total) by mouth at bedtime as needed. Trial in place of Ambien - if not effective we may switch back    Dispense:  30 tablet    Refill:  5   apixaban  (ELIQUIS ) 5 MG TABS tablet    Sig: Take 1 tablet (5 mg total) by mouth 2 (two) times daily. See if affordable and if it is we will work with coumadin  clinic to transition to this.    Dispense:  60 tablet    Refill:  5   Return precautions advised.  Clarisa Crooked, MD

## 2023-08-09 NOTE — Patient Instructions (Addendum)
 Only use lasix  as needed for increased swelling in legs - do not have to take everyday. That way you won't need to push as many fluids  Move half tablet of triamterene  hydrochlorothiazide to morning to help reduce nighttime urination  Trial belsomra /suvorexant  in place of Ambien .   If eliquis  is affordable- lets work with [facility] Coach to transition off of coumadin    Please stop by lab before you go If you have mychart- we will send your results within 3 business days of us  receiving them.  If you do not have mychart- we will call you about results within 5 business days of us  receiving them.  *please also note that you will see labs on mychart as soon as they post. I will later go in and write notes on them- will say "notes from Dr. Arlene Ben"   Recommended follow up: Return in about 5 weeks (around 09/13/2023) for followup or sooner if needed.Schedule b4 you leave.

## 2023-08-09 NOTE — Assessment & Plan Note (Signed)
 Long-term issues with insomnia even with Ambien  5 mg-family reports he has been taking an extra half or full tablet at 2 AM and is running out early-he then wakes up groggy in the morning (plus this could have contributed to encephalopathy).  We discussed potential memory/dementia related issues and alternative of Belsomra -he is willing to at least trial this

## 2023-08-09 NOTE — Assessment & Plan Note (Signed)
 Furosemide  is listed as needed but he has been taking daily-advised him to stop unless having significant worsening swelling.  Move half tablet of triamterene  hydrochlorothiazide to the morning to see if that helps with nighttime urination issues

## 2023-08-10 ENCOUNTER — Other Ambulatory Visit: Payer: Self-pay

## 2023-08-10 ENCOUNTER — Telehealth: Payer: Self-pay

## 2023-08-10 MED ORDER — BELSOMRA 5 MG PO TABS: 5.0000 mg | ORAL_TABLET | Freq: Every evening | ORAL | 5 refills | Status: DC | PRN

## 2023-08-10 MED ORDER — POTASSIUM CHLORIDE CRYS ER 10 MEQ PO TBCR: 10.0000 meq | EXTENDED_RELEASE_TABLET | Freq: Every day | ORAL | 0 refills | Status: DC

## 2023-08-10 NOTE — Telephone Encounter (Signed)
 Pt daughter came and picked up printed copy of Rx for pt.    Copied from CRM [id number]. Topic: Clinical - Prescription Issue >> Aug 10, 2023  8:26 AM Ivette P wrote: Reason for CRM: Pt Daughter called in about prescription Suvorexant  (BELSOMRA ) 5 MG TABS, [facility] does not have medication to [facility]. Kirsten would like a hand written prescription to be able to find a pharmacy willing to fulfill prescription. Would like to know if this is possible.    PT Daughter Justino Ona [phone]

## 2023-08-11 ENCOUNTER — Ambulatory Visit

## 2023-08-11 ENCOUNTER — Telehealth: Payer: Self-pay

## 2023-08-11 DIAGNOSIS — Z7901 Long term (current) use of anticoagulants: Secondary | ICD-10-CM | POA: Diagnosis not present

## 2023-08-11 DIAGNOSIS — E876 Hypokalemia: Secondary | ICD-10-CM

## 2023-08-11 LAB — POCT INR: INR: 1.4 — AB (ref 2.0–3.0)

## 2023-08-11 NOTE — Patient Instructions (Addendum)
 Pre visit review using our clinic review tool, if applicable. No additional management support is needed unless otherwise documented below in the visit note.  Increase dose today to take 2 tablets and increase dose tomorrow to take 1 1/2 tablets and then change weekly dose to take 1 1/2 tablet daily except take 1 tablet on Tuesday, Thursday, and Sunday. Recheck in 2 weeks.

## 2023-08-11 NOTE — Telephone Encounter (Signed)
 We have another visit on May 20 and I was planning on rechecking potassium at that time-I think it is okay to wait until then but if they really desire to move forward now okay to have Keba enter BMP under hypokalemia  Thank you for the update on the Eliquis  potential transition

## 2023-08-11 NOTE — Progress Notes (Signed)
 ER on 4/9 for acute encephalopathy and cystitis. Pt was placed on abx in the hospital and sent home with doxycycline  which he finished about 1 week ago. Hospital f/u with PCP reported pt/ family may want to transition to Eliquis  due to confusion/difficulty with warfarin dosing. Per Miki Alert, pt's daughter, right now she does not want to change to Eliquis  because there have already been so many changes in medications recently. She reports Eliquis  will cost $368/month but she is willing to pay that if needed in the future, but right now she does not want pt changing to Eliquis . [facility]  [facility]. She said she will see how next INR check is and decide at that time.   She also reported pt's K was low and PCP prescribed K tablets x 7 days. She is wondering if pt should return for another lab in 7 days or when PCP would want pt in to recheck K. Advised a msg would be sent to PCP requesting time for K retesting. Sent msg to PCP. Increase dose today to take 2 tablets and increase dose tomorrow to take 1 1/2 tablets and then change weekly dose to take 1 1/2 tablet daily except take 1 tablet on Tuesday, Thursday, and Sunday. Recheck in 2 weeks.

## 2023-08-11 NOTE — Telephone Encounter (Signed)
 ER on 4/9 for acute encephalopathy and cystitis. Pt was placed on abx in the hospital and sent home with doxycycline  which he finished about 1 week ago. Hospital f/u with PCP reported pt/ family may want to transition to Eliquis  due to confusion/difficulty with warfarin dosing. Per Miki Alert, pt's daughter, right now she does not want to change to Eliquis  because there have already been so many changes in medications recently. She reports Eliquis  will cost $368/month but she is willing to pay that if needed in the future, but right now she does not want pt changing to Eliquis . [facility]  [facility]. She said she will see how next INR check is and decide at that time.   She also reported pt's K was low and PCP prescribed K tablets x 7 days. She is wondering if pt should return for another lab in 7 days or when PCP would want pt in to recheck K. Advised a msg would be sent to PCP requesting time for K retesting.

## 2023-08-11 NOTE — Telephone Encounter (Signed)
 [facility] of PCP note. She said right now she thinks they will just wait until the f/u apt on 5/20. If she wants pt tested before then she will contact the office to make a lab apt and also so an order can be entered by CMA. Advised if any changes to contact the office. Miki Alert verbalized understanding.

## 2023-08-12 NOTE — Telephone Encounter (Signed)
 Future lab has already been ordered.

## 2023-08-13 ENCOUNTER — Telehealth: Payer: Self-pay | Admitting: Family Medicine

## 2023-08-13 ENCOUNTER — Ambulatory Visit: Payer: Self-pay

## 2023-08-13 NOTE — Telephone Encounter (Addendum)
 RN has attempted three calls to daughter karen. Voicemail is not set up. Please advise.          Copied from CRM [id number]. Topic: Clinical - Prescription Issue >> Aug 13, 2023 12:43 PM Jenice Mitts wrote: Reason for CRM: Patients daughter  is calling because he was prescribed a new sleeping medication and it gived him hallucinations and he is unable to sleep. Patient would like to know what is the next step. He did not take the new medication last night

## 2023-08-13 NOTE — Telephone Encounter (Signed)
 Received faxed document Home Health Certificate (Order ID [id number]), to be filled out by provider. Patient requested to send it back via Fax . Document is located in providers tray at front office.Please advise

## 2023-08-13 NOTE — Telephone Encounter (Signed)
Please see triage note and advise.  ?

## 2023-08-14 ENCOUNTER — Other Ambulatory Visit: Payer: Self-pay | Admitting: Family Medicine

## 2023-08-14 NOTE — Telephone Encounter (Signed)
 I was out of office and triage was not able to be completed. I can't recall how many Ambien  he has left- Ambien  is still not ideal but obviously new medicine isn't either- please add belsomra  to allergy list under hallucinations.   - please check and see if any Ambien  left and if he wants to go back to that (even though not ideal)

## 2023-08-16 ENCOUNTER — Other Ambulatory Visit: Payer: Self-pay

## 2023-08-16 MED ORDER — ZOLPIDEM TARTRATE 5 MG PO TABS: 5.0000 mg | ORAL_TABLET | Freq: Every evening | ORAL | 1 refills | Status: DC | PRN

## 2023-08-16 NOTE — Telephone Encounter (Signed)
 Once again this was loaded as e2c2 encounter by E2 C2-Tammy and Hosny please give feedback once again-this really needs to be corrected  I cannot send a prescription in due to how they loaded this-please send me another thread under our office

## 2023-08-16 NOTE — Telephone Encounter (Signed)
 Spoke with patients daughter, as of 10 days ago they do not have anymore ambien . However, are willing to try again if you will send in a new script. They have D/C belsomra , I have added this to patients allergy list

## 2023-08-16 NOTE — Telephone Encounter (Signed)
 Please inform for fingernail should only be 6 weeks or 45 days

## 2023-08-17 ENCOUNTER — Other Ambulatory Visit: Payer: Self-pay

## 2023-08-17 MED ORDER — ZOLPIDEM TARTRATE 5 MG PO TABS: 5.0000 mg | ORAL_TABLET | Freq: Every evening | ORAL | 2 refills | Status: DC | PRN

## 2023-08-17 NOTE — Telephone Encounter (Signed)
 It looks like this was "phoned in" on 08/13/23 but I know we were both out of the office.  I reached out to the pharmacy but kept getting prompted to leave vm, was never able to speak with a live person. Did you happen to phone this in on Friday?  Copied from CRM [id number]. Topic: Clinical - Medication Question >> Aug 16, 2023  1:43 PM [place] M wrote: Reason for CRM: Patient daughter calling back again from Friday to check on her dads medication for sleeping, he is having hallucinations and wanting to go on something else. Did not sleep well all weekend. Please call her as soon as possible #[phone]

## 2023-08-17 NOTE — Telephone Encounter (Signed)
 No I did not- see nurse phone note 08/13/23 e2c2 is loading these incorrectly- load here and send back to me- delete the phone in one listed

## 2023-08-17 NOTE — Addendum Note (Signed)
 Addended by: Arva Lathe on: 08/17/2023 11:55 AM   Modules accepted: Orders

## 2023-08-18 NOTE — Telephone Encounter (Signed)
 Forms signed and faxed back.

## 2023-08-25 ENCOUNTER — Ambulatory Visit

## 2023-08-25 DIAGNOSIS — Z7901 Long term (current) use of anticoagulants: Secondary | ICD-10-CM

## 2023-08-25 LAB — POCT INR: INR: 1.5 — AB (ref 2.0–3.0)

## 2023-08-25 NOTE — Progress Notes (Signed)
 I have reviewed and agree with note, evaluation, plan.   Tana Conch, MD

## 2023-08-25 NOTE — Progress Notes (Signed)
 ER on 4/9 for acute encephalopathy and cystitis. Pt was placed on abx in the hospital and sent home with doxycycline  which he finished several weeks ago. Hospital f/u with PCP reported pt/ family may want to transition to Eliquis  due to confusion/difficulty with warfarin dosing. Per Miki Alert, pt's daughter, right now she does not want to change to Eliquis  because there have already been so many changes in medications recently. She reports Eliquis  will cost $368/month but she is willing to pay that if needed in the future, but right now she does not want pt changing to Eliquis . [facility]  [facility]. She said she will see how next INR check is and decide at that time.   Increase dose today to take 2 tablets and increase dose tomorrow to take 1 1/2 tablets and then change weekly dose to take 1 1/2 tablet daily except take 1 tablet on Sunday. Recheck in 2 weeks.  Pt was brought to apt by his grandson today. Talked to Miki Alert, his daughter, on the phone to verify pt was taking warfarin as prescribed since last visit. She confirmed he was. Advised of new changes to dosing. Miki Alert verbalized understanding.

## 2023-08-25 NOTE — Patient Instructions (Addendum)
 Pre visit review using our clinic review tool, if applicable. No additional management support is needed unless otherwise documented below in the visit note.  Increase dose today to take 2 tablets and increase dose tomorrow to take 1 1/2 tablets and then change weekly dose to take 1 1/2 tablet daily except take 1 tablet on Sunday. Recheck in 2 weeks.

## 2023-08-29 ENCOUNTER — Other Ambulatory Visit: Payer: Self-pay | Admitting: Family Medicine

## 2023-09-07 ENCOUNTER — Ambulatory Visit: Admitting: Family Medicine

## 2023-09-07 ENCOUNTER — Ambulatory Visit: Payer: Self-pay | Admitting: Family Medicine

## 2023-09-07 ENCOUNTER — Ambulatory Visit (INDEPENDENT_AMBULATORY_CARE_PROVIDER_SITE_OTHER)

## 2023-09-07 ENCOUNTER — Encounter: Payer: Self-pay | Admitting: Family Medicine

## 2023-09-07 VITALS — BP 112/60 | HR 58 | Temp 98.0°F | Ht 71.0 in | Wt 163.4 lb

## 2023-09-07 DIAGNOSIS — J189 Pneumonia, unspecified organism: Secondary | ICD-10-CM | POA: Diagnosis not present

## 2023-09-07 DIAGNOSIS — I10 Essential (primary) hypertension: Secondary | ICD-10-CM | POA: Diagnosis not present

## 2023-09-07 DIAGNOSIS — Z5181 Encounter for therapeutic drug level monitoring: Secondary | ICD-10-CM | POA: Diagnosis not present

## 2023-09-07 DIAGNOSIS — E119 Type 2 diabetes mellitus without complications: Secondary | ICD-10-CM | POA: Diagnosis not present

## 2023-09-07 LAB — HEMOGLOBIN A1C: Hgb A1c MFr Bld: 6.5 % (ref 4.6–6.5)

## 2023-09-07 MED ORDER — WARFARIN SODIUM 2.5 MG PO TABS: ORAL_TABLET | ORAL | 1 refills | Status: DC

## 2023-09-07 MED ORDER — FLUTICASONE PROPIONATE 50 MCG/ACT NA SUSP: 2.0000 | Freq: Every day | NASAL | 3 refills | Status: AC

## 2023-09-07 NOTE — Progress Notes (Addendum)
 Phone [phone] In person visit   Subjective:   William Mahoney is a 89 y.o. year old very pleasant male patient who presents for/with See problem oriented charting Chief Complaint  Patient presents with   4 wk f/u    Pt c/o bilateral leg edema.    Past Medical History-  Patient Active Problem List   Diagnosis Date Noted   Diabetes mellitus type 2, controlled (HCC) 03/24/2023    Priority: High   AAA (abdominal aortic aneurysm) without rupture 11/20/2019    Priority: High   Edema 11/10/2016    Priority: High   Atrial fibrillation (HCC) 12/07/2008    Priority: High   PAD (peripheral artery disease) (HCC) 11/20/2019    Priority: Medium    Chronic kidney disease (CKD), stage III (moderate) (HCC) 02/15/2019    Priority: Medium    Aortic atherosclerosis (HCC) 01/05/2019    Priority: Medium    Insomnia 10/19/2014    Priority: Medium    Gout 01/09/2014    Priority: Medium    History of cancer of rectosigmoid junction 01/04/2008    Priority: Medium    Former smoker 06/23/2007    Priority: Medium    Hyperlipidemia, unspecified 02/09/2007    Priority: Medium    Essential hypertension 12/07/2006    Priority: Medium    COPD (chronic obstructive pulmonary disease) (HCC) 12/07/2006    Priority: Medium    BPH (benign prostatic hyperplasia) 12/07/2006    Priority: Medium    Encounter for therapeutic drug monitoring 08/27/2014    Priority: Low   Allergic rhinitis 08/15/2013    Priority: Low   Arthritis of lumbar spine 01/20/2013    Priority: Low   Inguinal hernia 03/21/2010    Priority: Low   BASAL CELL CARCINOMA SKIN LOWER LIMB INCL HIP 03/21/2010    Priority: Low   Actinic keratosis 12/07/2008    Priority: Low   TMJ (temporomandibular joint disorder) 02/09/2007    Priority: Low   URINARY INCONTINENCE 12/07/2006    Priority: Low   History of digestive system disease 12/07/2006    Priority: Low   Acute metabolic encephalopathy 07/29/2023   Leukocytosis 07/29/2023    Fever 07/29/2023   UTI (urinary tract infection) 07/29/2023   Porokeratosis 11/25/2021    Medications- reviewed and updated Current Outpatient Medications  Medication Sig Dispense Refill   acetaminophen  (TYLENOL ) 500 MG tablet Take 1,000 mg by mouth every 6 (six) hours as needed for moderate pain (pain score 4-6).     albuterol  (VENTOLIN  HFA) 108 (90 Base) MCG/ACT inhaler Inhale 2 puffs into the lungs every 6 (six) hours as needed for wheezing or shortness of breath. 1 each 2   allopurinol  (ZYLOPRIM ) 300 MG tablet TAKE ONE-HALF TABLET BY MOUTH IN THE EVENING 45 tablet 3   amLODipine  (NORVASC ) 5 MG tablet Take 1 tablet (5 mg total) by mouth daily. For blood pressure and possible Raynauds 90 tablet 3   fluocinonide  (LIDEX ) 0.05 % external solution APPLY 1 ML TO AFFECTED AREAS OF SCALP AND CHEST NIGHTLY (Patient taking differently: Apply 1 Application topically every other day. APPLY 1 ML TO AFFECTED AREAS OF SCALP AND CHEST) 60 mL 3   furosemide  (LASIX ) 20 MG tablet Take 1 tablet (20 mg total) by mouth daily as needed for edema or fluid. 90 tablet 1   ketoconazole  (NIZORAL ) 2 % cream Apply 1 Application topically 2 (two) times daily. For up to 2 weeks 60 g 1   Multiple Vitamin (MULTIVITAMIN) tablet Take 1 tablet by mouth daily.  tamsulosin  (FLOMAX ) 0.4 MG CAPS capsule Take 1 capsule (0.4 mg total) by mouth daily. (Patient taking differently: Take 0.4 mg by mouth at bedtime.) 90 capsule 3   terbinafine  (LAMISIL ) 250 MG tablet TAKE 1 TABLET (250 MG TOTAL) BY MOUTH DAILY. FOR TOENAIL FUNGUS/ONYCHOMYCOSIS 45 tablet 0   triamterene -hydrochlorothiazide (MAXZIDE-25) 37.5-25 MG tablet TAKE 1/2 TABLET BY MOUTH DAILY 45 tablet 1   zolpidem  (AMBIEN ) 5 MG tablet Take 1 tablet (5 mg total) by mouth at bedtime as needed for sleep (maximum one per day). 30 tablet 2   fluticasone  (FLONASE ) 50 MCG/ACT nasal spray Place 2 sprays into both nostrils daily. 48 g 3   warfarin (COUMADIN ) 2.5 MG tablet TAKE 1 TABLET  BY MOUTH DAILY OR AS DIRECTED BY ANTICOAGULATION CLINIC 110 tablet 1   No current facility-administered medications for this visit.     Objective:  BP 112/60   Pulse (!) 58   Temp 98 F (36.7 C)   Ht 5\' 11"  (1.803 m)   Wt 163 lb 6.4 oz (74.1 kg)   SpO2 96%   BMI 22.79 kg/m  Gen: NAD, resting comfortably CV: Irregular rate and mildly bradycardic  lungs: CTAB no crackles, wheeze, rhonchi Abdomen: soft/nontender/nondistended/normal bowel sounds. No rebound or guarding.  Ext: Some yellowing of middle fingernails bilaterally but improved.  Significant yellowing and thickening of toenails throughout both feet 2+ edema bilaterally worse on the left Skin: warm, dry  Diabetic foot exam was performed with the following findings:   No deformities, ulcerations, or other skin breakdown Normal sensation of 10g monofilament Intact posterior tibialis and dorsalis pedis pulses        Assessment and Plan    # History of pneumonia in left lower lobe of the lung and encephalopathy related to that versus cystitis-we discussed repeating chest x-ray today to ensure clearance of pneumonia.  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  -appetite improved- staying hydrated but we need to monitor this with edema  # Candidal balanitis-resolved on exam with triamcinolone  plus antifungal-encouraged antifungal only at last visit - no recurrence.   #Allergies- some watery eyes, itchy throat, runny nose- will trial Flonase  again- has not used lately  # Hypokalemia-we treated with short-term course of potassium at last visit-he is going to recheck today with ongoing triamterene -hydrochlorothiazide half tablet and now more frequent Lasix  every day  # Diabetes- new diagnosis based on labs 05/04/22 and 01/11/23 S: Medication:Diet controlled Lab Results  Component Value Date   HGBA1C 6.6 (H) 05/25/2023    HGBA1C 6.6 (H) 01/11/2023   HGBA1C 6.5 05/04/2022   A/P: diet controlled diabetes   # Atrial fibrillation-follows with Dr. Katheryne Pane of cardiology S: Rate controlled with no Rx Anticoagulated with Coumadin -follows in our clinic- having to increase dose A/P: appropriately anticoagulated (though adjusting coumadin ) and rate controlled- continue current medicine   #hypertension #Venous insufficiency S: medication: Amlodipine  2.5 mg--> 5 mg for help with Raynaud's, triamterene  hydrochlorothiazide 37.5-25 mg half tablet in the morning (missed some doses lately but has restarted in the last week), Lasix  20 mg as needed for edema- needing most days lately- swelling worse  A/P: blood pressure well controlled continue current medications Swelling worse off triamterene  hydrochlorothiazide- but recently restarted this and hoping swelling will come down- also on lasix  still -other option which they want to hold off on due to improvement in Raynaud for now- "Reduce amlodipine  if swelling  doesn't improve- reach out to me on that as we are restarting the triamterene  hydrochlorothiazide half tablet and that may help with fluid in legs"  #Insomnia S: Medication: Ambien  5 mg-aware of fall risk - tries to take just half -belsomra  trial horrible dreams A/P: failed besomra- continue current medications     Recommended follow up: Return in about 2 months (around 11/07/2023) for followup or sooner if needed.Schedule b4 you leave. Future Appointments  Date Time Provider Department Center  09/08/2023 10:30 AM [facility] COUMAD[facility]  CLINIC [facility]  10/01/2023  3:15 PM Evelina Hippo, MD [facility] None   Lab/Order associations:   ICD-10-CM   1. Encounter for therapeutic drug monitoring  Z51.81 warfarin (COUMADIN ) 2.5 MG tablet    2. Controlled type 2 diabetes mellitus without complication, without long-term current use of insulin (HCC)  E11.9 HgB A1c    Comprehensive metabolic panel with GFR    3. Essential  hypertension  I10 Comprehensive metabolic panel with GFR    4. Community acquired pneumonia, unspecified laterality  J18.9 DG Chest 2 View      Meds ordered this encounter  Medications   warfarin (COUMADIN ) 2.5 MG tablet    Sig: TAKE 1 TABLET BY MOUTH DAILY OR AS DIRECTED BY ANTICOAGULATION CLINIC    Dispense:  110 tablet    Refill:  1    90 DAY SUPPLY   fluticasone  (FLONASE ) 50 MCG/ACT nasal spray    Sig: Place 2 sprays into both nostrils daily.    Dispense:  48 g    Refill:  3    Return precautions advised.  Clarisa Crooked, MD

## 2023-09-07 NOTE — Patient Instructions (Addendum)
  Please stop by lab before you go If you have mychart- we will send your results within 3 business days of us  receiving them.  If you do not have mychart- we will call you about results within 5 business days of us  receiving them.  *please also note that you will see labs on mychart as soon as they post. I will later go in and write notes on them- will say "notes from Dr. Arlene Ben"   Stop by x-ray before you go  Get diabetic eye exam scheduled.  Reduce amlodipine  if swelling doesn't improve- reach out to me on that as we are restarting the triamterene  hydrochlorothiazide half tablet and that may help with fluid in legs and also elevate the legs  Recommended follow up: Return in about 2 months (around 11/07/2023) for followup or sooner if needed.Schedule b4 you leave.

## 2023-09-08 ENCOUNTER — Ambulatory Visit (INDEPENDENT_AMBULATORY_CARE_PROVIDER_SITE_OTHER)

## 2023-09-08 ENCOUNTER — Encounter: Payer: Self-pay | Admitting: Podiatry

## 2023-09-08 ENCOUNTER — Ambulatory Visit (INDEPENDENT_AMBULATORY_CARE_PROVIDER_SITE_OTHER): Admitting: Podiatry

## 2023-09-08 DIAGNOSIS — B351 Tinea unguium: Secondary | ICD-10-CM

## 2023-09-08 DIAGNOSIS — M79674 Pain in right toe(s): Secondary | ICD-10-CM

## 2023-09-08 DIAGNOSIS — N1831 Chronic kidney disease, stage 3a: Secondary | ICD-10-CM | POA: Diagnosis not present

## 2023-09-08 DIAGNOSIS — D696 Thrombocytopenia, unspecified: Secondary | ICD-10-CM | POA: Diagnosis not present

## 2023-09-08 DIAGNOSIS — Z7901 Long term (current) use of anticoagulants: Secondary | ICD-10-CM | POA: Diagnosis not present

## 2023-09-08 DIAGNOSIS — M79675 Pain in left toe(s): Secondary | ICD-10-CM | POA: Diagnosis not present

## 2023-09-08 LAB — COMPREHENSIVE METABOLIC PANEL WITH GFR
ALT: 12 U/L (ref 0–53)
AST: 22 U/L (ref 0–37)
Albumin: 4.1 g/dL (ref 3.5–5.2)
Alkaline Phosphatase: 57 U/L (ref 39–117)
BUN: 23 mg/dL (ref 6–23)
CO2: 28 meq/L (ref 19–32)
Calcium: 9.2 mg/dL (ref 8.4–10.5)
Chloride: 98 meq/L (ref 96–112)
Creatinine, Ser: 1.08 mg/dL (ref 0.40–1.50)
GFR: 58.17 mL/min — ABNORMAL LOW (ref >60.00)
Glucose, Bld: 98 mg/dL (ref 70–99)
Potassium: 3.6 meq/L (ref 3.5–5.1)
Sodium: 138 meq/L (ref 135–145)
Total Bilirubin: 0.7 mg/dL (ref 0.2–1.2)
Total Protein: 6.6 g/dL (ref 6.0–8.3)

## 2023-09-08 LAB — POCT INR: INR: 1.9 — AB (ref 2.0–3.0)

## 2023-09-08 NOTE — Progress Notes (Signed)
This patient returns to my office for at risk foot care.  This patient requires this care by a professional since this patient will be at risk due to having  PAD, CKD coagulation defect and thrombocytopenia.  Patient is taking coumadin.  This patient is unable to cut nails himself since the patient cannot reach his nails.These nails are painful walking and wearing shoes. This patient presents for at risk foot care today.  General Appearance  Alert, conversant and in no acute stress.  Vascular  Dorsalis pedis and posterior tibial  pulses are  weakly palpable  bilaterally.  Capillary return is within normal limits  bilaterally. Temperature is within normal limits  bilaterally. Venous disease feet dorsally  B/l.  Neurologic  Senn-Weinstein monofilament wire test within normal limits  bilaterally. Muscle power within normal limits bilaterally.  Nails Thick disfigured discolored nails with subungual debris  from hallux to fifth toes bilaterally. No evidence of bacterial infection or drainage bilaterally.  Orthopedic  No limitations of motion  feet .  No crepitus or effusions noted.  No bony pathology or digital deformities noted.  Skin  normotropic skin noted bilaterally.  No signs of infections or ulcers noted.     Onychomycosis  Pain in right toes  Pain in left toes  Porokeratosis left heel.    Consent was obtained for treatment procedures.   Mechanical debridement of nails 1-5  bilaterally performed with a nail nipper.  Filed with dremel without incident.    Return office visit    3  months                  Told patient to return for periodic foot care and evaluation due to potential at risk complications.   Abree Romick DPM   

## 2023-09-08 NOTE — Patient Instructions (Addendum)
 Pre visit review using our clinic review tool, if applicable. No additional management support is needed unless otherwise documented below in the visit note.  Increase dose today to take 2 tablets  and then change weekly dose to take 1 1/2 tablet daily. Recheck in 2 weeks.

## 2023-09-08 NOTE — Progress Notes (Signed)
 ER on 4/9 for acute encephalopathy and cystitis. Pt was placed on abx in the hospital and sent home with doxycycline  which he finished several weeks ago. Hospital f/u with PCP reported pt/ family may want to transition to Eliquis  due to confusion/difficulty with warfarin dosing. Per William Mahoney, pt's daughter, right now she does not want to change to Eliquis  because there have already been so many changes in medications recently. She reports Eliquis  will cost $368/month but she is willing to pay that if needed in the future, but right now she does not want pt changing to Eliquis . [facility]. She said she will see how next INR check is and decide at that time.  Increase dose today to take 2 tablets  and then change weekly dose to take 1 1/2 tablet daily. Recheck in 2 weeks.  Pt was brought to apt by his grandson today. Advised if William Mahoney, pt's daughter, has any questions to contact the coumadin  clinic.

## 2023-09-08 NOTE — Progress Notes (Signed)
 I have reviewed and agree with note, evaluation, plan.   Tana Conch, MD

## 2023-09-22 ENCOUNTER — Ambulatory Visit (INDEPENDENT_AMBULATORY_CARE_PROVIDER_SITE_OTHER)

## 2023-09-22 DIAGNOSIS — Z7901 Long term (current) use of anticoagulants: Secondary | ICD-10-CM | POA: Diagnosis not present

## 2023-09-22 LAB — POCT INR: INR: 1.9 — AB (ref 2.0–3.0)

## 2023-09-22 NOTE — Progress Notes (Signed)
 I have reviewed and agree with note, evaluation, plan.   Tana Conch, MD

## 2023-09-22 NOTE — Patient Instructions (Addendum)
 Pre visit review using our clinic review tool, if applicable. No additional management support is needed unless otherwise documented below in the visit note.  Increase dose today to take 2 tablets  and then change weekly dose to take 1 1/2 tablet daily except take 2 tablets on Monday and Thursday. Recheck in 3 weeks.

## 2023-09-22 NOTE — Progress Notes (Signed)
 Pt and his daughter still deny any changes.  Increase dose today to take 2 tablets  and then change weekly dose to take 1 1/2 tablet daily except take 2 tablets on Monday and Thursday. Recheck in 3 weeks.

## 2023-10-01 ENCOUNTER — Encounter (INDEPENDENT_AMBULATORY_CARE_PROVIDER_SITE_OTHER): Payer: Self-pay | Admitting: Otolaryngology

## 2023-10-01 ENCOUNTER — Ambulatory Visit (INDEPENDENT_AMBULATORY_CARE_PROVIDER_SITE_OTHER): Payer: Medicare Other | Admitting: Otolaryngology

## 2023-10-01 ENCOUNTER — Other Ambulatory Visit (INDEPENDENT_AMBULATORY_CARE_PROVIDER_SITE_OTHER): Payer: Self-pay | Admitting: Otolaryngology

## 2023-10-01 VITALS — BP 111/71 | HR 85 | Ht 71.0 in | Wt 162.0 lb

## 2023-10-01 DIAGNOSIS — H9202 Otalgia, left ear: Secondary | ICD-10-CM

## 2023-10-01 DIAGNOSIS — H90A32 Mixed conductive and sensorineural hearing loss, unilateral, left ear with restricted hearing on the contralateral side: Secondary | ICD-10-CM | POA: Diagnosis not present

## 2023-10-01 DIAGNOSIS — J392 Other diseases of pharynx: Secondary | ICD-10-CM

## 2023-10-01 DIAGNOSIS — M25512 Pain in left shoulder: Secondary | ICD-10-CM

## 2023-10-01 DIAGNOSIS — H7292 Unspecified perforation of tympanic membrane, left ear: Secondary | ICD-10-CM | POA: Diagnosis not present

## 2023-10-01 DIAGNOSIS — G8929 Other chronic pain: Secondary | ICD-10-CM

## 2023-10-01 MED ORDER — LIDOCAINE VISCOUS HCL 2 % MT SOLN: OROMUCOSAL | 1 refills | Status: DC

## 2023-10-01 NOTE — Progress Notes (Unsigned)
 Dear Dr. Arlene Ben, Here is my assessment for our mutual patient, William Mahoney. Thank you for allowing me the opportunity to care for your patient. Please do not hesitate to contact me should you have any other questions. Sincerely, Dr. Milon Aloe  Otolaryngology Clinic Note Referring provider: Dr. Arlene Ben HPI:  William Mahoney is a 89 y.o. male kindly referred by Dr. Arlene Ben for evaluation of left otitis media and otitis externa.  Initial visit (03/2023): Patient reports: started to have left ear pain and throbbing about a month ago, went to Dr. Waldo Guitar and then Dr. Arlene Ben. Dr. Waldo Guitar cleaned the ear, noted a TM perforation, and prescribed cortisporin drop and omnicef  on 03/16/2023. The ear was irrigated with water. He took the abx but was having diarrhea so stopped, and then saw Dr. Arlene Ben who prescribed ciprofloxacin  (took them for only 2 days). He has been irrigating the ear with hydrogen peroxide. He reports he is having a dull ache in left ear but better than before and some fullness (some kind of blockage). He currently denies ear drainage. No hearing change, but daughter reports hearing decline Ears are typically not a problem. Q-tip use sometimes. No audiogram prior No throat problems since seeing Dr. Hester Lot for throat discomfort; some dysphagia which is stable; no mental status changes or other laryngology sx including voice change, other neck masses, throat pain  Patient denies: vertigo, tinnitus Patient additionally denies: eustachian tube symptoms such as popping, crackling, sensitive to pressure changes Patient also denies barotrauma, vestibular suppressant use Prior ear surgery: no --------------------------------------- He was noted to have a left TM perforation, and given prior symptoms we treated him for AOM and seen in follow up.  Initial visit (05/03/2023): Used the drops. He is reportedly doing much better now. Maybe some left ear dull ache, but certainly no pain or  throbbing. Keeping ear dry. No fullness or drainage.  He does now reports that he is having some radiating discomfort from back of neck (unclear, he does not know exactly) to shoulder/deltoid area and now with some right ear discomfort (on the auricle) too. No other issues. No swallowing or throat complaints. Does not hurt to turn the neck. He did have audiogram today ------------------------------------------- Given pain in left deltoid area and small amount of asymmetry of palate, we discussed repeat Abx and he agreed. He returns today for re-check  05/24/2023 He is doing about a same from shoulder standpoint - still has pain mostly over deltoid and distal trapezius area (not posteriorly). He reports ear is doing ok - no significant pain. Keeping ear dry. No fullness, drainage No swallowing or throat complaints. After discussion, he would like to proceed with paper patch to see if it would help.  --------------------------------------------------------- 10/01/2023 He reports that the paper patch helped significantly - he reports that when he had it, he had improvement in fullness and ear was not painful at all. No swallowing or throat issues. Hearing did not help much.  H&N Surgery: denies Personal or FHx of bleeding dz or anesthesia difficulty: no  AP/AC: warfarin  Tobacco: quit 1980 (40 pack year history). Alcohol: no. 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 Lives in [place], [place]  PMHx: Diabetes, Dysphagia, A-fib, HTN, Insomnia, CKD, Aortic Aneurysm  Independent Review of Additional Tests or Records:  ENT Dr. Hildy Lowers  (01/2022):  throat discomfort; began with URI sx mid-sept, rx amoxicillin  with some improvement; left ear pain, and no dysphagia or other problems except for some voice change. Soft palate asymmetry and had CT/MRI;  at time of eval, did improve; TFL was normal at that point; TFL showed normal palate at that point and no asymmetry noted. Rx: Clindamycin , celestone; then lost to  f/u Ref notes Dr. Arlene Ben and Dr. Waldo Guitar (02/2023 and 03/2023): notes reviewed and in chart; left ear pain worsened in Nov, ear clogged, using acetic acid /steroid drops; no drainage; no URI sx; Dx otitis externa, non-supprative otitis media and left TM rupture, Rx: cortisporin; ear irrigated; improved after Dr. Jacinta Martinis Rx, stopped abx Noted some dysphagia (food getting stuck), random; did not want further workup  CBC 03/2023: no leukoctosis; Plt 137 CMP: generally wnl  CT Neck 01/2022 and MRI 01/2022: reviewed independently, agree with left soft palate asymmetry with enhancement along left longus coli and tonsillar fossa; no parotid or parapharyngeal space nodules; no significant retoropharyngeal edema or abscess; no other significant LN noted; mastoids clear; no NP masses noted   05/03/2023 Audiogram was independently reviewed and interpreted by me and I agree with read - AD Type A tymp; left large vol Right ear- Borderline normal to severe sensorineural hearing loss from 250 Hz - 8000 Hz. Left ear-  Mild to severe mixed hearing loss from 250 Hz - 8000 Hz. Right ear: 72% was obtained at a presentation level of 85 dBHL with contralateral masking which is deemed as  fair; Left ear: 64% was obtained at a presentation level of 85 dBHL with contralateral masking which is deemed as  poor  SNHL= Sensorineural hearing loss  CT Head 04/01/2023: independently reviewed with attention to ears - bilateral ME well aerated; some inferior opacification left mastoid; I do not see significant asymmetry in nasopharynx although left NP slightly more full. No significant mastoid erosion  04/01/2023 CBC no leukocytosis  PMH/Meds/All/SocHx/FamHx/ROS:   Past Medical History:  Diagnosis Date   BPH (benign prostatic hypertrophy)    Chronic atrial fibrillation [facility])    cardiologist--   dr berry   COPD (chronic obstructive pulmonary disease) (HCC)    Diverticulosis of colon    MODERATE LEFT SIDE   Epididymal cyst     w/ epididymalitis   Full dentures    Gout    Hand foot syndrome    secondary to chemotherapy (Xeloda)   cold hands/feet   Hearing loss    History of alcohol abuse    quit drinking in the mid 80's   History of cirrhosis of liver    alcoholic--  hx alcohol abuse -- quit drinking 1980's   History of gout    History of melanoma excision    2013--  left ear lobe and back of hand   History of pancreatitis    2008   History of rectal cancer oncologist-  dr Scherrie Curt--  no recurrence   dx Sept 2009--  Stage II (T3N0)  s/p  sigmoid colectomy & low anterior resection 04-18-2008  and chemoradiation 2010   History of shingles 07/27/2010   History of squamous cell carcinoma excision    left lower leg   Hyperlipidemia    Hypertension    Loose stools    DUE TO ANTIBIOTICS   Macular degeneration    not sure which eye   OA (osteoarthritis)    RBBB (right bundle branch block)    Renal lesion    chronic-- left side   Sciatica of right side    Urge urinary incontinence    intermittant     Past Surgical History:  Procedure Laterality Date   CARDIAC CATHETERIZATION  2001  approx.  in  [place]    CATARACT EXTRACTION W/ INTRAOCULAR LENS  IMPLANT, BILATERAL     COLONOSCOPY  last one 06-27-2012   EXPLORATORY LAPAROTOMY/  LOW ANTERIOR RESECTION/  SIGMOID COLECTOMY  04-18-2008   DR INGRAM   INCISION AND DRAINAGE ABSCESS N/A 11/02/2014   Procedure: INCISION AND DRAINAGE OF SCROTUM;  Surgeon: Andrez Banker, MD;  Location[facility];  Service: Urology;  Laterality: N/A;   INGUINAL HERNIA REPAIR Right 03/23/2014   Procedure: HERNIA REPAIR INGUINAL ADULT OPEN REPAIR RIGHT INGUINAL HERNIA REPAIR;  Surgeon: Boyce Byes, MD;  Location: [facility] OR;  Service: General;  Laterality: Right;   INSERTION OF MESH Right 03/23/2014   Procedure: INSERTION OF MESH;  Surgeon: Boyce Byes, MD;  Location: [facility] OR;  Service: General;  Laterality: Right;   LAPAROSCOPIC CHOLECYSTECTOMY  1990's   MASS  EXCISION N/A 11/02/2014   Procedure: EXCISION OF EPIDIDYMAL CYST;  Surgeon: Andrez Banker, MD;  Location: [facility];  Service: Urology;  Laterality: N/A;   TONSILLECTOMY  as child   TRANSTHORACIC ECHOCARDIOGRAM  05-11-2008   pseudonormal LV filling pattern,  ef 60-65%/  mild to moderate MV calcification no stenosis w/ mild to moderate regurg./  mild LAE and RAE/  mild TR    Family History  Problem Relation Age of Onset   Hypertension Mother    Lymphoma Father    Lymphoma Other    Stroke Other        1st degree relative   Colon cancer Neg Hx    Esophageal cancer Neg Hx    Rectal cancer Neg Hx    Stomach cancer Neg Hx      Social Connections: Moderately Isolated (07/29/2023)   Social Connection and Isolation Panel    Frequency of Communication with Friends and Family: Twice a week    Frequency of Social Gatherings with Friends and Family: Twice a week    Attends Religious Services: More than 4 times per year    Active Member of [employer] or Organizations: No    Attends Banker Meetings: Never    Marital Status: Widowed      Current Outpatient Medications:    acetaminophen  (TYLENOL ) 500 MG tablet, Take 1,000 mg by mouth every 6 (six) hours as needed for moderate pain (pain score 4-6)., Disp: , Rfl:    albuterol  (VENTOLIN  HFA) 108 (90 Base) MCG/ACT inhaler, Inhale 2 puffs into the lungs every 6 (six) hours as needed for wheezing or shortness of breath., Disp: 1 each, Rfl: 2   allopurinol  (ZYLOPRIM ) 300 MG tablet, TAKE ONE-HALF TABLET BY MOUTH IN THE EVENING, Disp: 45 tablet, Rfl: 3   amLODipine  (NORVASC ) 5 MG tablet, Take 1 tablet (5 mg total) by mouth daily. For blood pressure and possible Raynauds, Disp: 90 tablet, Rfl: 3   ammonium lactate (LAC-HYDRIN) 12 % lotion, Apply topically., Disp: , Rfl:    fluocinonide  (LIDEX ) 0.05 % external solution, APPLY 1 ML TO AFFECTED AREAS OF SCALP AND CHEST NIGHTLY (Patient taking differently: Apply 1 Application  topically every other day. APPLY 1 ML TO AFFECTED AREAS OF SCALP AND CHEST), Disp: 60 mL, Rfl: 3   fluticasone  (FLONASE ) 50 MCG/ACT nasal spray, Place 2 sprays into both nostrils daily., Disp: 48 g, Rfl: 3   furosemide  (LASIX ) 20 MG tablet, Take 1 tablet (20 mg total) by mouth daily as needed for edema or fluid., Disp: 90 tablet, Rfl: 1   ketoconazole  (NIZORAL ) 2 % cream, Apply 1 Application topically 2 (two) times daily.  For up to 2 weeks, Disp: 60 g, Rfl: 1   Multiple Vitamin (MULTIVITAMIN) tablet, Take 1 tablet by mouth daily., Disp: , Rfl:    ofloxacin  (OCUFLOX ) 0.3 % ophthalmic solution, SMARTSIG:4 Drop(s) Left Ear Morning-Night, Disp: , Rfl:    tamsulosin  (FLOMAX ) 0.4 MG CAPS capsule, Take 1 capsule (0.4 mg total) by mouth daily. (Patient taking differently: Take 0.4 mg by mouth at bedtime.), Disp: 90 capsule, Rfl: 3   terbinafine  (LAMISIL ) 250 MG tablet, TAKE 1 TABLET (250 MG TOTAL) BY MOUTH DAILY. FOR TOENAIL FUNGUS/ONYCHOMYCOSIS, Disp: 45 tablet, Rfl: 0   triamterene -hydrochlorothiazide (MAXZIDE-25) 37.5-25 MG tablet, TAKE 1/2 TABLET BY MOUTH DAILY, Disp: 45 tablet, Rfl: 1   warfarin (COUMADIN ) 2.5 MG tablet, TAKE 1 TABLET BY MOUTH DAILY OR AS DIRECTED BY ANTICOAGULATION CLINIC, Disp: 110 tablet, Rfl: 1   zolpidem  (AMBIEN ) 5 MG tablet, Take 1 tablet (5 mg total) by mouth at bedtime as needed for sleep (maximum one per day)., Disp: 30 tablet, Rfl: 2   Physical Exam:   BP 111/71 (BP Location: Left Arm, Patient Position: Sitting, Cuff Size: Normal)   Pulse 85   Ht 5' 11 (1.803 m)   Wt 162 lb (73.5 kg)   SpO2 96%   BMI 22.59 kg/m   Salient findings:  CN II-XII intact - slight palate asymmetry but clearly does raise Given history and complaints, bilateral ear microscopy was indicated and performed for evaluation with findings as below in physical exam section and in procedures Right ear: EAC clear and TM intact with well pneumatized middle ear spaces Left ear EAC clear; left TM  perforation - ~10-15% anteroinferior, clean, no debris or drainage. No cholesteatoma or migrating epithelial debris -- paper patch placed (see below) with some improvement in hearing No erythema of ears; no postauricular pain or proptosis or swelling. Weber 512: inconsistent mid, and left Rinne 512: AC > BC b/l  Anterior rhinoscopy: Septum relatively midline; no masses noted No lesions of oral cavity/oropharynx; palate elevation occurs bilaterally, but left is more sluggish than right and there is some mild asymmetry; erythema or tenderness. Given concern for carcinoma based on prior asymmetry and lack of response to abx, TFL was indicated and performed as well. Parotids without masses; palpable tongue base without abnormality No obviously palpable neck masses/lymphadenopathy/thyromegaly No respiratory distress or stridor  Seprately Identifiable Procedures:  PRE-Proecedure DIAGNOSIS: Tympanic membrane perforation - left POST-Procedure DIAGNOSIS: Same PROCEDURE: Left tympanic membrane repair for perforation with paper patch - CPT [phone] Surgeon(s) and Role: Milon Aloe, MD INDICATIONS:  William Mahoney is a 89 y.o. male with left tympanic membrane perforation. We discussed R/B/A for myringoplasty v/s tympanoplasty v/s observation and he opted for myringoplasty. I did discuss R/B/A including future infections, lack of closure, persistent symptoms and hearing loss among others and patient opted to proceed after consent  FINDINGS:  Left 15% clean perforation anteroinferior; successful paper patch myringoplasty  PROCEDURE DETAILS:  After being properly identified, the patient placed supine. Consent was obtained.  The left ear was inspected under the microscope.  Using a speculum and a curette, cerumen was removed from the external auditory canal.  With the TM fully in view, the edges of the TM perforation were roughed up and freshened with a rasp.  With the edges bleeding, a paper patch was placed  over the perforation. Two ciprodex  drops were placed to slightly moisten the patch to promote adherence. A cotton ball was then placed in the ear canal.  The patient tolerated the procedure well  COMPLICATIONS:  None apparent   Procedure Note Pre-procedure diagnosis: Palate asymmetry, history of posterior oropharyngeal enhancing lesion, concern for carcinoma Post-procedure diagnosis: Same Procedure: Transnasal Fiberoptic Laryngoscopy, CPT 31575 - Mod 25 Indication: see above Complications: None apparent EBL: 0 mL  The procedure was undertaken to further evaluation given lack of response to treatment and rule out carcinoma, with mirror exam inadequate for appropriate examination due to gag reflex and poor patient tolerance  Procedure:  Patient was identified as correct patient. Verbal consent was obtained. The nose was sprayed with oxymetazoline and 4% lidocaine . The The flexible laryngoscope was passed through the nose to view the nasal cavity, pharynx (oropharynx, hypopharynx) and larynx.  The larynx was examined at rest and during multiple phonatory tasks. Documentation was obtained and reviewed with patient. The scope was removed. The patient tolerated the procedure well.  Findings: The nasal cavity and nasopharynx did not reveal any masses or lesions except as below (see below). Palate elevates bilaterally. The tongue base, pharyngeal walls, piriform sinuses, vallecula, epiglottis and postcricoid region are normal in appearance EXCEPT: slight fullness left posterior OP and NP - no obvious erosion or tenderness on palpation with scope. The visualized portion of the subglottis and proximal trachea is widely patent. The vocal folds are mobile bilaterally. There are no lesions on the free edge of the vocal folds nor elsewhere in the larynx worrisome for malignancy.       Electronically signed by: Evelina Hippo, MD 10/01/2023 3:34 PM   Impression & Plans:  William Mahoney is a 89 y.o. male  with:  No diagnosis found.  Left ear looks stable and dry; after R/B/A discussion, did perform paper patch myringoplasty for closure attempt; we discussed tympanoplasty but given his age and co-morbidities and the fact that this is not a marginal perforation, do not think tympanoplasty would be necessary given risks  Ear pain has improved but now has some shoulder/neck pain. Unclear cause - perhaps musculoskeletal. He has had prior scans for palate asymmetry (which is mild but persists) and small enhancing area along left longus coli and posterior OP which is I think apparent on TFL today He was prior Rx with abx/steroids. I do not know if this would cause this, and we did discuss the persistent asymmetry. I recommended repeat CT but he declined despite risk of potential undiagnosed carcinoma. It is possible that perhaps this is a musculoskeletal complaint given his isolated ear complaint has improved significantly and it is primarily the shoulder that is bothering him. He also had a recent [facility] after a fall and this also looks generally reassuring - possible masked mastoiditis? No evidence of MOE.  1) Keep ear dry 2) Agree with musculoskeletal workup for shoulder pain 3) CT declined despite discussion as above; advised him to contact us  should he change his mind 4) can consider MRI for asymmetry but given age, I do not think putting him through MRI would be warranted given most Schwannomas slow growing.   Lidocaine  - 2%  F/u 3 months, sooner if any other concerns  See below regarding exact medications prescribed this encounter including dosages and route: No orders of the defined types were placed in this encounter.   Thank you for allowing me the opportunity to care for your patient. Please do not hesitate to contact me should you have any other questions.  Sincerely, Milon Aloe, MD Otolaryngologist (ENT), [facility] Phone: [phone] Fax: [phone]  10/01/2023,  3:34 PM   MDM:  Level 4 -  [address]109 Complexity/Problems addressed: mod - multiple chronic problems Data complexity: low - Morbidity: low  - Prescription Drug prescribed or managed: yes

## 2023-10-04 ENCOUNTER — Telehealth: Payer: Self-pay

## 2023-10-04 NOTE — Telephone Encounter (Signed)
 Pt has coumadin  clinic apt scheduled for 6/25 at 11 am. Coumadin  clinic nurse will not be available at that time.   LVM with Miki Alert, pt's daughter, requesting to change it to 7/2 at 11 am.

## 2023-10-06 ENCOUNTER — Telehealth (INDEPENDENT_AMBULATORY_CARE_PROVIDER_SITE_OTHER): Payer: Self-pay

## 2023-10-06 NOTE — Telephone Encounter (Signed)
 Coumadin  clinic nurse will be in on 6/25. No need to RS pt.  LVM no change is needed.

## 2023-10-11 ENCOUNTER — Encounter: Payer: Self-pay | Admitting: Family Medicine

## 2023-10-11 ENCOUNTER — Ambulatory Visit: Admitting: Family Medicine

## 2023-10-11 VITALS — BP 126/68 | HR 69 | Temp 97.3°F | Ht 71.0 in | Wt 154.8 lb

## 2023-10-11 DIAGNOSIS — E119 Type 2 diabetes mellitus without complications: Secondary | ICD-10-CM | POA: Diagnosis not present

## 2023-10-11 DIAGNOSIS — G4700 Insomnia, unspecified: Secondary | ICD-10-CM | POA: Diagnosis not present

## 2023-10-11 DIAGNOSIS — I10 Essential (primary) hypertension: Secondary | ICD-10-CM

## 2023-10-11 DIAGNOSIS — R29898 Other symptoms and signs involving the musculoskeletal system: Secondary | ICD-10-CM | POA: Diagnosis not present

## 2023-10-11 MED ORDER — TRAZODONE HCL 50 MG PO TABS: 25.0000 mg | ORAL_TABLET | Freq: Every evening | ORAL | 3 refills | Status: DC | PRN

## 2023-10-11 NOTE — Progress Notes (Signed)
 Phone [phone] In person visit   Subjective:   William Mahoney is a 89 y.o. year old very pleasant male patient who presents for/with See problem oriented charting Chief Complaint  Patient presents with   Insomnia    Past Medical History-  Patient Active Problem List   Diagnosis Date Noted   Diabetes mellitus type 2, controlled (HCC) 03/24/2023    Priority: High   AAA (abdominal aortic aneurysm) without rupture 11/20/2019    Priority: High   Edema 11/10/2016    Priority: High   Atrial fibrillation (HCC) 12/07/2008    Priority: High   PAD (peripheral artery disease) (HCC) 11/20/2019    Priority: Medium    Chronic kidney disease (CKD), stage III (moderate) (HCC) 02/15/2019    Priority: Medium    Aortic atherosclerosis (HCC) 01/05/2019    Priority: Medium    Insomnia 10/19/2014    Priority: Medium    Gout 01/09/2014    Priority: Medium    History of cancer of rectosigmoid junction 01/04/2008    Priority: Medium    Former smoker 06/23/2007    Priority: Medium    Hyperlipidemia, unspecified 02/09/2007    Priority: Medium    Essential hypertension 12/07/2006    Priority: Medium    COPD (chronic obstructive pulmonary disease) (HCC) 12/07/2006    Priority: Medium    BPH (benign prostatic hyperplasia) 12/07/2006    Priority: Medium    Encounter for therapeutic drug monitoring 08/27/2014    Priority: Low   Allergic rhinitis 08/15/2013    Priority: Low   Arthritis of lumbar spine 01/20/2013    Priority: Low   Inguinal hernia 03/21/2010    Priority: Low   BASAL CELL CARCINOMA SKIN LOWER LIMB INCL HIP 03/21/2010    Priority: Low   Actinic keratosis 12/07/2008    Priority: Low   TMJ (temporomandibular joint disorder) 02/09/2007    Priority: Low   URINARY INCONTINENCE 12/07/2006    Priority: Low   History of digestive system disease 12/07/2006    Priority: Low   Acute metabolic encephalopathy 07/29/2023   Leukocytosis 07/29/2023   Fever 07/29/2023   UTI (urinary  tract infection) 07/29/2023   Porokeratosis 11/25/2021    Medications- reviewed and updated Current Outpatient Medications  Medication Sig Dispense Refill   acetaminophen  (TYLENOL ) 500 MG tablet Take 1,000 mg by mouth every 6 (six) hours as needed for moderate pain (pain score 4-6).     albuterol  (VENTOLIN  HFA) 108 (90 Base) MCG/ACT inhaler Inhale 2 puffs into the lungs every 6 (six) hours as needed for wheezing or shortness of breath. 1 each 2   allopurinol  (ZYLOPRIM ) 300 MG tablet TAKE ONE-HALF TABLET BY MOUTH IN THE EVENING 45 tablet 3   amLODipine  (NORVASC ) 5 MG tablet Take 1 tablet (5 mg total) by mouth daily. For blood pressure and possible Raynauds 90 tablet 3   ammonium lactate (LAC-HYDRIN) 12 % lotion Apply topically.     fluocinonide  (LIDEX ) 0.05 % external solution APPLY 1 ML TO AFFECTED AREAS OF SCALP AND CHEST NIGHTLY (Patient taking differently: Apply 1 Application topically every other day. APPLY 1 ML TO AFFECTED AREAS OF SCALP AND CHEST) 60 mL 3   fluticasone  (FLONASE ) 50 MCG/ACT nasal spray Place 2 sprays into both nostrils daily. 48 g 3   furosemide  (LASIX ) 20 MG tablet Take 1 tablet (20 mg total) by mouth daily as needed for edema or fluid. 90 tablet 1   ketoconazole  (NIZORAL ) 2 % cream Apply 1 Application topically 2 (two) times daily.  For up to 2 weeks 60 g 1   Multiple Vitamin (MULTIVITAMIN) tablet Take 1 tablet by mouth daily.     ofloxacin  (OCUFLOX ) 0.3 % ophthalmic solution SMARTSIG:4 Drop(s) Left Ear Morning-Night     tamsulosin  (FLOMAX ) 0.4 MG CAPS capsule Take 1 capsule (0.4 mg total) by mouth daily. (Patient taking differently: Take 0.4 mg by mouth at bedtime.) 90 capsule 3   traZODone  (DESYREL ) 50 MG tablet Take 0.5-1 tablets (25-50 mg total) by mouth at bedtime as needed for sleep. 30 tablet 3   triamterene -hydrochlorothiazide (MAXZIDE-25) 37.5-25 MG tablet TAKE 1/2 TABLET BY MOUTH DAILY 45 tablet 1   warfarin (COUMADIN ) 2.5 MG tablet TAKE 1 TABLET BY MOUTH DAILY  OR AS DIRECTED BY ANTICOAGULATION CLINIC 110 tablet 1   zolpidem  (AMBIEN ) 5 MG tablet Take 1 tablet (5 mg total) by mouth at bedtime as needed for sleep (maximum one per day). 30 tablet 2   No current facility-administered medications for this visit.     Objective:  BP 126/68   Pulse 69   Temp (!) 97.3 F (36.3 C)   Ht 5' 11 (1.803 m)   Wt 154 lb 12.8 oz (70.2 kg)   SpO2 91%   BMI 21.59 kg/m  Gen: NAD, resting comfortably CV: RRR no murmurs rubs or gallops Lungs: CTAB no crackles, wheeze, rhonchi Abdomen: soft/nontender/nondistended/normal bowel sounds. No rebound or guarding.  Ext: no edema Skin: warm, dry Neuro:walks with cane, 5-/5 leg extension    Assessment and Plan   # Left tympanic membrane perforation-has had paper patch that failed-they are opting to monitor for now but may get replacement patch but ear has been better  #Insomnia S: Medication: Ambien  5 mg-aware of fall risk - tries to take just half but has been overusing - looks like took about 20 days to go through 30 ambien  -belsomra  trial horrible dreams -failed trazodone  as substitute in 2019 A/P: poor control but over using Ambien -  Trial trazodone  25 mg (half tablet) along with half tablet of Ambien .   Discussed 30 days of Ambien  needs to last at least 30 days  #weight loss- 8 lbs down- no ankle swelling but reports some weeping in thighs (not at night)- none noted today . More anxious with poor sleep # deconditioning/leg weakness S: during this time of poor sleep he has become less mobile and has noted difficulty raising up from chair and activity has dropped off and feels weak. Extremely hard time getting out of bed yesterday for instance A/P: set back with poor sleep with low appetite and lower activity- will attempt home health to get him moving again.  - has not seemed as sharp mentally with poor sleep -did have nodule unchanged from December- could do Ct but we have opted out previously- if further  weight loss would scan (has previously declined even with known possible cancer risk)  # Diabetes- new diagnosis based on labs 05/04/22 and 01/11/23 S: Medication:Diet controlled Lab Results  Component Value Date   HGBA1C 6.5 09/07/2023   HGBA1C 6.6 (H) 05/25/2023   HGBA1C 6.6 (H) 01/11/2023   A/P: well controlled with no medicine continue to monitor   # Atrial fibrillation-follows with Dr. Court of cardiology S: Rate controlled with no Rx Anticoagulated with Coumadin -follows in our clinic -eliquis  $400 a month in 2025 A/P: appropriately anticoagulated with coumadinand rate controlled (no prescription) - continue current medicine     #hypertension #Venous insufficiency S: medication: Amlodipine  2.5 mg--> 5 mg for help with Raynaud's, triamterene   hydrochlorothiazide 37.5-25 mg half tablet in the morning, Lasix  20 mg as needed for edema- taking since has had thigh weeping BP Readings from Last 3 Encounters:  10/11/23 126/68  10/01/23 111/71  09/07/23 112/60   A/P: well controlled continue current medications   Recommended follow up: Return in about 1 month (around 11/10/2023) for followup or sooner if needed.Schedule b4 you leave. Future Appointments  Date Time Provider Department Center  10/13/2023 11:00 AM [facility]  CLINIC [facility]  11/11/2023  1:20 PM Katrinka Garnette KIDD, MD [facility]  02/01/2024  3:00 PM Tobie Eldora NOVAK, MD [facility] None    Lab/Order associations:   ICD-10-CM   1. Muscular deconditioning  R29.898 Ambulatory referral to Home Health    2. Weakness of both lower extremities  R29.898 Ambulatory referral to Home Health    3. Insomnia, unspecified type  G47.00     4. Controlled type 2 diabetes mellitus without complication, without long-term current use of insulin (HCC)  E11.9     5. Essential hypertension  I10       Meds ordered this encounter  Medications   traZODone  (DESYREL ) 50 MG tablet    Sig: Take 0.5-1 tablets (25-50 mg total) by mouth  at bedtime as needed for sleep.    Dispense:  30 tablet    Refill:  3    Return precautions advised.  Garnette Katrinka, MD

## 2023-10-11 NOTE — Patient Instructions (Addendum)
 We have placed a referral for you today to home health- please call their # if you do not hear within a week (may be listed below or you may see mychart message within a few days with #).   Trial trazodone  25 mg (half tablet) along with half tablet of Ambien .   Trial colace up to twice daily for constipation  Bloodwork if not improving by follow up with weight loss  Recommended follow up: Return in about 1 month (around 11/10/2023) for followup or sooner if needed.Schedule b4 you leave.

## 2023-10-12 ENCOUNTER — Other Ambulatory Visit: Payer: Self-pay | Admitting: Family Medicine

## 2023-10-13 ENCOUNTER — Ambulatory Visit

## 2023-10-14 ENCOUNTER — Telehealth: Payer: Self-pay

## 2023-10-14 ENCOUNTER — Other Ambulatory Visit: Payer: Self-pay

## 2023-10-14 ENCOUNTER — Other Ambulatory Visit: Payer: Self-pay | Admitting: Family Medicine

## 2023-10-14 DIAGNOSIS — R29898 Other symptoms and signs involving the musculoskeletal system: Secondary | ICD-10-CM

## 2023-10-14 NOTE — Telephone Encounter (Signed)
 Copied from CRM [id number]. Topic: Clinical - Medication Refill >> Oct 14, 2023  4:12 PM Berneda FALCON wrote: Medication:  zolpidem  (AMBIEN ) 5 MG tablet   Has the patient contacted their pharmacy? Yes (Agent: If no, request that the patient contact the pharmacy for the refill. If patient does not wish to contact the pharmacy document the reason why and proceed with request.) (Agent: If yes, when and what did the pharmacy advise?)  This is the patient's preferred pharmacy:  [id number] [address], [place] - [address] [place] [address] Phone: [phone] Fax: [phone]  Is this the correct pharmacy for this prescription? Yes If no, delete pharmacy and type the correct one.   Has the prescription been filled recently? No  Is the patient out of the medication? Yes, patient has not slept in 3 days, and needs this urgently please!  Has the patient been seen for an appointment in the last year OR does the patient have an upcoming appointment? Yes  Can we respond through MyChart? Yes  Agent: Please be advised that Rx refills may take up to 3 business days. We ask that you follow-up with your pharmacy.

## 2023-10-14 NOTE — Telephone Encounter (Signed)
 Called and spoke with pt daughter and referral has been placed for [facility].  Copied from CRM [id number]. Topic: General - Other >> Oct 08, 2023 12:38 PM Deidre T wrote: Reason for CRM: patient daughter is requesting pt orders for patient cb number is  [phone]

## 2023-10-18 NOTE — Telephone Encounter (Signed)
 Called in Ofloxacin  drops to patient's pharmacy on file.

## 2023-10-19 NOTE — Telephone Encounter (Signed)
 He just filled 10/14/23- should not need refill

## 2023-10-20 ENCOUNTER — Ambulatory Visit (INDEPENDENT_AMBULATORY_CARE_PROVIDER_SITE_OTHER)

## 2023-10-20 DIAGNOSIS — Z7901 Long term (current) use of anticoagulants: Secondary | ICD-10-CM

## 2023-10-20 LAB — POCT INR: INR: 3.5 — AB (ref 2.0–3.0)

## 2023-10-20 NOTE — Progress Notes (Signed)
 Pt and his daughter still deny any changes.  Hold dose today and then change weekly dose to take 1 1/2 tablet daily except take 2 tablets on Sunday. Recheck in 3 weeks.

## 2023-10-20 NOTE — Patient Instructions (Addendum)
 Pre visit review using our clinic review tool, if applicable. No additional management support is needed unless otherwise documented below in the visit note.  Pt and his daughter still deny any changes.  Hold dose today and then change weekly dose to take 1 1/2 tablet daily except take 2 tablets on Sunday. Recheck in 3 weeks.

## 2023-10-20 NOTE — Addendum Note (Signed)
 Addended by: KATRINKA GARNETTE KIDD on: 10/20/2023 05:00 PM   Modules accepted: Orders

## 2023-10-21 NOTE — Progress Notes (Signed)
 I have reviewed the patient's encounter and agree with the documentation.  Worth HERO. Kennyth, MD 10/21/2023 7:49 AM

## 2023-10-28 ENCOUNTER — Telehealth: Payer: Self-pay | Admitting: Family Medicine

## 2023-10-28 ENCOUNTER — Ambulatory Visit (INDEPENDENT_AMBULATORY_CARE_PROVIDER_SITE_OTHER): Admitting: Otolaryngology

## 2023-10-28 VITALS — BP 116/79 | HR 73

## 2023-10-28 DIAGNOSIS — H7292 Unspecified perforation of tympanic membrane, left ear: Secondary | ICD-10-CM | POA: Diagnosis not present

## 2023-10-28 NOTE — Telephone Encounter (Signed)
 Received faxed document Home Health Certificate (Order ID [id number]), to be filled out by provider. Patient requested to send it back via Fax . Document is located in providers tray at front office.Please advise

## 2023-10-29 NOTE — Telephone Encounter (Signed)
Paperwork faxed back

## 2023-10-31 ENCOUNTER — Encounter (INDEPENDENT_AMBULATORY_CARE_PROVIDER_SITE_OTHER): Payer: Self-pay | Admitting: Otolaryngology

## 2023-10-31 NOTE — Progress Notes (Signed)
 OTOLARYNGOLOGY PROCEDURE VISIT  DATE OF PROCEDURE: 10/28/2023  PRE-PROCEDURE DIAGNOSIS: Tympanic membrane perforation left  POST-PROCEDURE DIAGNOSIS: Same  PROCEDURE: Left tympanic membrane repair for perforation with paper patch - CPT [id number]  Surgeon(s) and Role: Eldora Blanch, MD  INDICATIONS:  William Mahoney is a 89 y.o. male with left tympanic membrane perforation. He has failed a prior paper patch, and we did discuss that given size, he may not heal without a tympanoplasty. He reports that his ear is quite sensitive with significant improvement in sensitivity after prior patch. He does not wish to go through myringoplasty with fat (even if in office) and would like to avoid tympanoplasty. We discussed R/B/A for myringoplasty v/s tympanoplasty including the fact that paper patch would likely not lead to a complete closure. He opted for myringoplasty with paper patch. I did discuss R/B/A including future infections, lack of closure, persistent symptoms and hearing loss among others and patient opted to proceed after consent  FINDINGS:  Left 15% clean perforation anteroinferior; successful paper patch myringoplasty  PROCEDURE DETAILS:  After being properly identified, the patient was placed supine and verbal consent obtained prior to proceeding.   The left ear was inspected under the microscope.  Using a speculum and a curette, mild amount of present cerumen was removed from the external auditory canal.  With the TM fully in view, the edges of the TM perforation were roughed up and freshened with a rasp.  With the edges bleeding, a paper patch was placed over the perforation. Two ciprodex  drops were placed to slightly moisten the patch to promote adherence and a small piece of gelfoam was placed over the paper patch. A cotton ball was then placed in the ear canal.   The patient tolerated the procedure well  COMPLICATIONS:  None apparent  FOLLOW UP: 3 months, sooner as needed.

## 2023-11-02 ENCOUNTER — Other Ambulatory Visit: Payer: Self-pay | Admitting: Family Medicine

## 2023-11-09 ENCOUNTER — Other Ambulatory Visit: Payer: Self-pay | Admitting: Family Medicine

## 2023-11-10 ENCOUNTER — Ambulatory Visit

## 2023-11-10 DIAGNOSIS — Z7901 Long term (current) use of anticoagulants: Secondary | ICD-10-CM

## 2023-11-10 LAB — POCT INR: INR: 3.2 — AB (ref 2.0–3.0)

## 2023-11-10 NOTE — Progress Notes (Signed)
 I have reviewed and agree with note, evaluation, plan.   Tana Conch, MD

## 2023-11-10 NOTE — Patient Instructions (Addendum)
 Pre visit review using our clinic review tool, if applicable. No additional management support is needed unless otherwise documented below in the visit note.  Reduce dose today to take 1/2 tablet and then change weekly dose to take 1 1/2 tablet daily. Recheck in 3 weeks.

## 2023-11-10 NOTE — Progress Notes (Addendum)
 Pt and his daughter still deny any changes.  Reduce dose today to take 1/2 tablet and then change weekly dose to take 1 1/2 tablet daily. Recheck in 3 weeks.

## 2023-11-11 ENCOUNTER — Ambulatory Visit: Admitting: Family Medicine

## 2023-11-11 ENCOUNTER — Encounter: Payer: Self-pay | Admitting: Family Medicine

## 2023-11-11 VITALS — BP 120/68 | HR 63 | Temp 98.0°F | Ht 71.0 in | Wt 158.4 lb

## 2023-11-11 DIAGNOSIS — R29898 Other symptoms and signs involving the musculoskeletal system: Secondary | ICD-10-CM

## 2023-11-11 DIAGNOSIS — E119 Type 2 diabetes mellitus without complications: Secondary | ICD-10-CM | POA: Diagnosis not present

## 2023-11-11 DIAGNOSIS — I10 Essential (primary) hypertension: Secondary | ICD-10-CM | POA: Diagnosis not present

## 2023-11-11 DIAGNOSIS — I4891 Unspecified atrial fibrillation: Secondary | ICD-10-CM

## 2023-11-11 DIAGNOSIS — J449 Chronic obstructive pulmonary disease, unspecified: Secondary | ICD-10-CM

## 2023-11-11 MED ORDER — ZOLPIDEM TARTRATE 5 MG PO TABS: 5.0000 mg | ORAL_TABLET | Freq: Every evening | ORAL | 2 refills | Status: DC | PRN

## 2023-11-11 NOTE — Progress Notes (Signed)
 Phone [phone] In person visit   Subjective:   William Mahoney is a 89 y.o. year old very pleasant male patient who presents for/with See problem oriented charting Chief Complaint  Patient presents with   1 month f/u    DEE not scheduled    Past Medical History-  Patient Active Problem List   Diagnosis Date Noted   Diabetes mellitus type 2, controlled (HCC) 03/24/2023    Priority: High   AAA (abdominal aortic aneurysm) without rupture 11/20/2019    Priority: High   Edema 11/10/2016    Priority: High   Atrial fibrillation (HCC) 12/07/2008    Priority: High   PAD (peripheral artery disease) (HCC) 11/20/2019    Priority: Medium    Chronic kidney disease (CKD), stage III (moderate) (HCC) 02/15/2019    Priority: Medium    Aortic atherosclerosis (HCC) 01/05/2019    Priority: Medium    Insomnia 10/19/2014    Priority: Medium    Gout 01/09/2014    Priority: Medium    History of cancer of rectosigmoid junction 01/04/2008    Priority: Medium    Former smoker 06/23/2007    Priority: Medium    Hyperlipidemia, unspecified 02/09/2007    Priority: Medium    Essential hypertension 12/07/2006    Priority: Medium    COPD (chronic obstructive pulmonary disease) (HCC) 12/07/2006    Priority: Medium    BPH (benign prostatic hyperplasia) 12/07/2006    Priority: Medium    Encounter for therapeutic drug monitoring 08/27/2014    Priority: Low   Allergic rhinitis 08/15/2013    Priority: Low   Arthritis of lumbar spine 01/20/2013    Priority: Low   Inguinal hernia 03/21/2010    Priority: Low   BASAL CELL CARCINOMA SKIN LOWER LIMB INCL HIP 03/21/2010    Priority: Low   Actinic keratosis 12/07/2008    Priority: Low   TMJ (temporomandibular joint disorder) 02/09/2007    Priority: Low   URINARY INCONTINENCE 12/07/2006    Priority: Low   History of digestive system disease 12/07/2006    Priority: Low   Acute metabolic encephalopathy 07/29/2023   Leukocytosis 07/29/2023   Fever  07/29/2023   UTI (urinary tract infection) 07/29/2023   Porokeratosis 11/25/2021    Medications- reviewed and updated Current Outpatient Medications  Medication Sig Dispense Refill   acetaminophen  (TYLENOL ) 500 MG tablet Take 1,000 mg by mouth every 6 (six) hours as needed for moderate pain (pain score 4-6).     albuterol  (VENTOLIN  HFA) 108 (90 Base) MCG/ACT inhaler Inhale 2 puffs into the lungs every 6 (six) hours as needed for wheezing or shortness of breath. 1 each 2   allopurinol  (ZYLOPRIM ) 300 MG tablet TAKE ONE-HALF TABLET BY MOUTH IN THE EVENING 45 tablet 3   amLODipine  (NORVASC ) 5 MG tablet Take 1 tablet (5 mg total) by mouth daily. For blood pressure and possible Raynauds 90 tablet 3   ammonium lactate (LAC-HYDRIN) 12 % lotion Apply topically.     fluocinonide  (LIDEX ) 0.05 % external solution APPLY 1 ML TO AFFECTED AREAS OF SCALP AND CHEST NIGHTLY 60 mL 3   fluticasone  (FLONASE ) 50 MCG/ACT nasal spray Place 2 sprays into both nostrils daily. 48 g 3   furosemide  (LASIX ) 20 MG tablet Take 1 tablet (20 mg total) by mouth daily as needed for edema or fluid. 90 tablet 1   ketoconazole  (NIZORAL ) 2 % cream Apply 1 Application topically 2 (two) times daily. For up to 2 weeks 60 g 1   Multiple Vitamin (  MULTIVITAMIN) tablet Take 1 tablet by mouth daily.     ofloxacin  (OCUFLOX ) 0.3 % ophthalmic solution SMARTSIG:4 Drop(s) Left Ear Morning-Night     tamsulosin  (FLOMAX ) 0.4 MG CAPS capsule Take 1 capsule (0.4 mg total) by mouth daily. 90 capsule 3   traZODone  (DESYREL ) 50 MG tablet TAKE 0.5-1 TABLETS BY MOUTH AT BEDTIME AS NEEDED FOR SLEEP. 90 tablet 2   triamterene -hydrochlorothiazide (MAXZIDE-25) 37.5-25 MG tablet TAKE 1/2 TABLET BY MOUTH DAILY 45 tablet 1   warfarin (COUMADIN ) 2.5 MG tablet TAKE 1 TABLET BY MOUTH DAILY OR AS DIRECTED BY ANTICOAGULATION CLINIC 110 tablet 1   zolpidem  (AMBIEN ) 5 MG tablet Take 1 tablet (5 mg total) by mouth at bedtime as needed for sleep (maximum one per day.  11/13/23 fill date and max monthly). 30 tablet 2   No current facility-administered medications for this visit.     Objective:  BP 120/68   Pulse 63   Temp 98 F (36.7 C)   Ht 5' 11 (1.803 m)   Wt 158 lb 6.4 oz (71.8 kg)   SpO2 97%   BMI 22.09 kg/m  Gen: NAD, resting comfortably Left tympanic membrane patch in place it appears CV: regular rate no murmurs rubs or gallops Lungs: CTAB no crackles, wheeze, rhonchi Ext: trace edema Skin: warm, dry     Assessment and Plan   # Left tympanic membrane perforation repair with paper patch on 10/28/2023 with Dr. Tobie- appears to be in place   # Insomnia-last visit we were going to try half tablet of trazodone  50 mg along with half tablet of Ambien .  He reports doesn't want to try trazodone  right now but is doing ok with half tablet Ambien  for the most part- family thinks he may be taking a whole tablet. We will monitor based on refill- last was 10/14/23.  - improved- continue current medications   # Prior unintentional weight loss-thankfully has stabilized- appetite has picked up and back up 4 lbs after being down 8 lbs last visit   #muscular deconditioning- some improvements with physical therapy but only once a week.  Has not graduated yet- will continue to work with them -energy better on exercise and appetite better- all working well together -encouraged continued exercise even when gradutes from PT  # Diabetes- new diagnosis based on labs 05/04/22 and 01/11/23 S: Medication:Diet controlled Lab Results  Component Value Date   HGBA1C 6.5 09/07/2023   HGBA1C 6.6 (H) 05/25/2023   HGBA1C 6.6 (H) 01/11/2023   A/P: well controlled continue current medications   #Rash on chest - does not feel like dermatology was helpful- plans on not returning. Vaseline helps some. Still itches. Ammonium lactate and ketoconazole  helps some too  # Atrial fibrillation-follows with Dr. Court of cardiology S: Rate controlled with no Rx Anticoagulated  with Coumadin -follows in our clinic A/P: appropriately anticoagulated and rate controlled- continue current medicine    #hypertension S: medication: Amlodipine  2.5 mg--> 5 mg for help with Raynaud's, triamterene  hydrochlorothiazide 37.5-25 mg half tablet in the morning, Lasix  20 mg as needed for edema A/P: Hypertension well-controlled-continue current medication    #COPD- incidentally noted but no breathing issues- continue to monitor   Recommended follow up: Return in about 3 months (around 02/11/2024) for followup or sooner if needed.Schedule b4 you leave. Future Appointments  Date Time Provider Department Center  12/01/2023 11:00 AM [facility]  CLINIC [facility]  02/01/2024  3:00 PM Tobie Eldora NOVAK, MD [facility] None  02/11/2024  2:20 PM Katrinka Senior  O, MD [facility]    Lab/Order associations:   ICD-10-CM   1. Muscular deconditioning  R29.898     2. Controlled type 2 diabetes mellitus without complication, without long-term current use of insulin (HCC)  E11.9     3. Essential hypertension  I10     4. Atrial fibrillation, unspecified type (HCC)  I48.91     5. Chronic obstructive pulmonary disease, unspecified COPD type (HCC) Chronic J44.9       Meds ordered this encounter  Medications   zolpidem  (AMBIEN ) 5 MG tablet    Sig: Take 1 tablet (5 mg total) by mouth at bedtime as needed for sleep (maximum one per day. 11/13/23 fill date and max monthly).    Dispense:  30 tablet    Refill:  2    Return precautions advised.  Garnette Lukes, MD

## 2023-11-11 NOTE — Patient Instructions (Addendum)
 Update diabetic eye exam  Glad you are doing a little better  Recommended follow up: Return in about 3 months (around 02/11/2024) for followup or sooner if needed.Schedule b4 you leave.

## 2023-11-15 ENCOUNTER — Other Ambulatory Visit: Payer: Self-pay | Admitting: Family Medicine

## 2023-11-15 NOTE — Telephone Encounter (Signed)
Please advise refill request

## 2023-11-17 NOTE — Telephone Encounter (Signed)
 Please let them kno would prefer to discuss net in person visit

## 2023-11-17 NOTE — Telephone Encounter (Signed)
 Called and spoke with pt daughter and below message given. She is Unsure of why you don't want to refill/discuss until his next office visit in October because she does not want his fungus to get out of control without the medication.

## 2023-11-25 ENCOUNTER — Other Ambulatory Visit: Payer: Self-pay | Admitting: Family Medicine

## 2023-12-01 ENCOUNTER — Ambulatory Visit

## 2023-12-02 ENCOUNTER — Telehealth: Payer: Self-pay

## 2023-12-02 ENCOUNTER — Ambulatory Visit: Admitting: Family Medicine

## 2023-12-02 ENCOUNTER — Encounter: Payer: Self-pay | Admitting: Family Medicine

## 2023-12-02 VITALS — BP 125/80 | HR 70 | Temp 97.9°F | Ht 71.0 in | Wt 158.6 lb

## 2023-12-02 DIAGNOSIS — K1121 Acute sialoadenitis: Secondary | ICD-10-CM | POA: Diagnosis not present

## 2023-12-02 DIAGNOSIS — Z7901 Long term (current) use of anticoagulants: Secondary | ICD-10-CM | POA: Diagnosis not present

## 2023-12-02 DIAGNOSIS — E119 Type 2 diabetes mellitus without complications: Secondary | ICD-10-CM | POA: Diagnosis not present

## 2023-12-02 MED ORDER — CEPHALEXIN 500 MG PO CAPS: 500.0000 mg | ORAL_CAPSULE | Freq: Two times a day (BID) | ORAL | 0 refills | Status: DC

## 2023-12-02 NOTE — Patient Instructions (Signed)
 Please follow up if symptoms do not improve or as needed.    Parotitis  Parotitis is inflammation of one or both of the parotid glands. These glands produce saliva. They are found on each side of the face, below and in front of the earlobes. The saliva that they produce comes out of tiny openings (ducts) inside the cheeks. Parotitis may cause sudden swelling and pain (acute parotitis). It can also cause repeated episodes of swelling and pain or continued swelling that may or may not be painful (chronic parotitis). What are the causes? This condition may be caused by: Infections from bacteria. Infections from viruses, such as mumps or HIV. Blockage (obstruction) of saliva flow through the parotid glands. This can be from a stone, scar tissue, or a tumor. Diseases that cause your body's defense system (immune system) to attack healthy cells in your salivary glands. These are called autoimmune diseases. What increases the risk? You are more likely to develop this condition if: You are 3 years old or older. You do not drink enough fluids (are dehydrated). You drink too much alcohol. You have: A dry mouth. Diabetes. Gout. A long-term illness. You do not take good care of your mouth and teeth (poor oral hygiene). You have had radiation treatments to the head and neck. You take certain medicines. What are the signs or symptoms? Symptoms of this condition depend on the cause. Symptoms may include: Swelling under and in front of the ear. This may get worse after eating. Pain and tenderness over the parotid gland. This may get worse after eating. Redness and warmth of the skin over the parotid gland. Fever or chills. Pus coming from the ducts inside the mouth. Dry mouth. A bad taste in the mouth. How is this diagnosed? This condition may be diagnosed based on: Your medical history. A physical exam. Tests to find the cause of the parotitis. These may include: Doing blood tests to check  for an autoimmune disease or infections from a virus. Taking a fluid sample from the parotid gland and testing it for infection. Injecting the ducts of the parotid gland with a dye and then taking X-rays (sialogram). Having other imaging tests of the gland, such as X-rays, ultrasound, MRI, or CT scan. Checking the opening of the gland for a stone or obstruction. Placing a needle into the gland to remove tissue for a biopsy (fine needle aspiration). How is this treated? Treatment for this condition depends on the cause. Treatment may include: Antibiotic medicine for a bacterial infection. NSAIDs, such as ibuprofen, to treat pain and swelling. Drinking more fluids. Removing a stone or obstruction. Treating an underlying disease that is causing parotitis. Surgery to drain an infection, remove a growth, or remove the whole gland (parotidectomy). Treatment may not be needed if parotid swelling goes away with home care. Follow these instructions at home: Medicines  Take over-the-counter and prescription medicines only as told by your health care provider. If you were prescribed an antibiotic medicine, take it as told by your health care provider. Do not stop taking the antibiotic even if you start to feel better. Managing pain and swelling If directed, apply heat to the affected area as often as told by your health care provider. Use the heat source that your health care provider recommends, such as a moist heat pack or a heating pad. Place a towel between your skin and the heat source. Leave the heat on for 20-30 minutes. Remove the heat if your skin turns bright red.  This is especially important if you are unable to feel pain, heat, or cold. You have a greater risk of getting burned. Gargle with a mixture of salt and water 3-4 times a day or as needed. To make salt water, completely dissolve -1 tsp (3-6 g) of salt in 1 cup (237 mL) of warm water. Gently massage the parotid glands as told by  your health care provider. General instructions  Drink enough fluid to keep your urine pale yellow. Keep your mouth clean and moist. Try sucking on sour candy. This may help to make your mouth less dry by stimulating the flow of saliva. Maintain good oral health. Brush your teeth at least two times a day. Floss your teeth every day. See your dentist regularly. Do not use any products that contain nicotine or tobacco. These products include cigarettes, chewing tobacco, and vaping devices, such as e-cigarettes. If you need help quitting, ask your health care provider. Do not drink alcohol. Keep all follow-up visits. This is important. Contact a health care provider if: You have a fever or chills. You have new symptoms. Your symptoms get worse. Your symptoms do not improve with treatment. Get help right away if: You have difficulty breathing or swallowing because of the swollen gland. These symptoms may represent a serious problem that is an emergency. Do not wait to see if the symptoms will go away. Get medical help right away. Call your local emergency services (911 in the [place] Do not drive yourself to the hospital. Summary Parotitis is inflammation of one or both of the parotid glands. Symptoms include pain and swelling under and in front of the ear. They may also include a fever and a bad taste in your mouth. This condition may be treated with antibiotics, NSAIDs, increasing fluids, or surgery. In some cases, parotitis may go away on its own without treatment. You should drink plenty of fluids, maintain good oral health, and do not use products that contain nicotine or tobacco. This information is not intended to replace advice given to you by your health care provider. Make sure you discuss any questions you have with your health care provider. Document Revised: 08/16/2020 Document Reviewed: 08/16/2020 Elsevier Patient Education  2024 [employer].

## 2023-12-02 NOTE — Telephone Encounter (Signed)
 Pt NS coumadin  clinic apt yesterday. Contacted Monica, pt's daughter, and RS apt for next week. Monica reports pt has an apt today for swelling on his jaw. Pt told him it could be a cyst that is infected. Advised if any new medications to contact the coumadin  clinic. Monica verbalized understanding.  Scheduled for next week at [facility].

## 2023-12-02 NOTE — Progress Notes (Signed)
 Subjective  CC:  Chief Complaint  Patient presents with   Facial Swelling    Pt stated that he noticed a lump on the side of his Left jaw 2 days ago. It is laying by the jaw bone and it is painful to the touch     HPI: William Mahoney is a 89 y.o. male who presents to the office today to address the problems listed above in the chief complaint. Discussed the use of AI scribe software for clinical note transcription with the patient, who gave verbal consent to proceed.  History of Present Illness William Mahoney is a 89 year old male who presents with a lump from the ear to the jaw. He is accompanied by his son, William Mahoney.  He noticed a lump extending from his ear to his jaw approximately two to three days ago. The sensation is described as if it 'came from the ear down to the jaw' and is sore. The lump is painful when pressed but does not cause pain when chewing or at rest. He has been applying a warm compress to the area.  No fever or feeling unwell. 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 He is currently on Coumadin  and is scheduled for a blood check next Wednesday. He mentions that his blood has been a bit thin lately.  There is a patch in his ear that was placed about three to four weeks ago due to a hole in his ear.   Assessment  1. Acute parotitis   2. Long term (current) use of anticoagulants [Z79.01]   3. Controlled type 2 diabetes mellitus without complication, without long-term current use of insulin (HCC)      Plan  Assessment and Plan Assessment & Plan Parotiditis Swelling and soreness of the parotid gland for 2-3 days. No fever or systemic symptoms. Pain on palpation, not during mastication. No visible discharge from duct nor palpable stone. Differential includes viral or bacterial etiology or obstruction. Mild inflammation noted. Coumadin  use requires monitoring. - Prescribed antibiotics for bacterial coverage. Keflex  500  bid x 7d. Monitor coumadin  levels and bleeding closely. Pt will make coumadin  clinic aware. - Advised warm compresses for discomfort. - Recommended lemon drops to stimulate salivation and aid stone passage. - Scheduled follow-up with Doctor William Mahoney next week. Can assess need for imaging at that time if needed.  - Notified Coumadin  clinic about antibiotic use to monitor INR. - Advised regular acetaminophen  for pain, avoiding ibuprofen.  DM: control is good. Monitor sugars. Associated with sialadenitis.   Follow up: 1 week if needed No orders of the defined types were placed in this encounter.  Meds ordered this encounter  Medications   cephALEXin  (KEFLEX ) 500 MG capsule    Sig: Take 1 capsule (500 mg total) by mouth 2 (two) times daily.    Dispense:  14 capsule    Refill:  0     I reviewed the patients updated PMH, FH, and SocHx.  Patient Active Problem List   Diagnosis Date Noted   Acute metabolic encephalopathy 07/29/2023   Leukocytosis 07/29/2023   Fever 07/29/2023   UTI (urinary tract infection) 07/29/2023   Diabetes mellitus type 2, controlled (HCC) 03/24/2023   Porokeratosis 11/25/2021   AAA (abdominal aortic aneurysm) without rupture 11/20/2019   PAD (peripheral artery disease) (HCC) 11/20/2019   Chronic kidney disease (CKD), stage III (moderate) (HCC) 02/15/2019   Aortic atherosclerosis (HCC) 01/05/2019   Edema 11/10/2016  Insomnia 10/19/2014   Encounter for therapeutic drug monitoring 08/27/2014   Gout 01/09/2014   Allergic rhinitis 08/15/2013   Arthritis of lumbar spine 01/20/2013   Inguinal hernia 03/21/2010   BASAL CELL CARCINOMA SKIN LOWER LIMB INCL HIP 03/21/2010   Atrial fibrillation (HCC) 12/07/2008   Actinic keratosis 12/07/2008   History of cancer of rectosigmoid junction 01/04/2008   Former smoker 06/23/2007   Hyperlipidemia, unspecified 02/09/2007   TMJ (temporomandibular joint disorder) 02/09/2007   Essential hypertension 12/07/2006   COPD (chronic  obstructive pulmonary disease) (HCC) 12/07/2006   BPH (benign prostatic hyperplasia) 12/07/2006   URINARY INCONTINENCE 12/07/2006   History of digestive system disease 12/07/2006   Current Meds  Medication Sig   acetaminophen  (TYLENOL ) 500 MG tablet Take 1,000 mg by mouth every 6 (six) hours as needed for moderate pain (pain score 4-6).   albuterol  (VENTOLIN  HFA) 108 (90 Base) MCG/ACT inhaler Inhale 2 puffs into the lungs every 6 (six) hours as needed for wheezing or shortness of breath.   allopurinol  (ZYLOPRIM ) 300 MG tablet TAKE ONE-HALF TABLET BY MOUTH IN THE EVENING   amLODipine  (NORVASC ) 5 MG tablet Take 1 tablet (5 mg total) by mouth daily. For blood pressure and possible Raynauds   ammonium lactate (LAC-HYDRIN) 12 % lotion Apply topically.   cephALEXin  (KEFLEX ) 500 MG capsule Take 1 capsule (500 mg total) by mouth 2 (two) times daily.   fluocinonide  (LIDEX ) 0.05 % external solution APPLY 1 ML TO AFFECTED AREAS OF SCALP AND CHEST NIGHTLY   fluticasone  (FLONASE ) 50 MCG/ACT nasal spray Place 2 sprays into both nostrils daily.   furosemide  (LASIX ) 20 MG tablet Take 1 tablet (20 mg total) by mouth daily as needed for edema or fluid.   ketoconazole  (NIZORAL ) 2 % cream Apply 1 Application topically 2 (two) times daily. For up to 2 weeks   Multiple Vitamin (MULTIVITAMIN) tablet Take 1 tablet by mouth daily.   ofloxacin  (OCUFLOX ) 0.3 % ophthalmic solution SMARTSIG:4 Drop(s) Left Ear Morning-Night   tamsulosin  (FLOMAX ) 0.4 MG CAPS capsule TAKE 1 CAPSULE BY MOUTH EVERY DAY   traZODone  (DESYREL ) 50 MG tablet TAKE 0.5-1 TABLETS BY MOUTH AT BEDTIME AS NEEDED FOR SLEEP.   triamterene -hydrochlorothiazide (MAXZIDE-25) 37.5-25 MG tablet TAKE 1/2 TABLET BY MOUTH DAILY   warfarin (COUMADIN ) 2.5 MG tablet TAKE 1 TABLET BY MOUTH DAILY OR AS DIRECTED BY ANTICOAGULATION CLINIC   zolpidem  (AMBIEN ) 5 MG tablet Take 1 tablet (5 mg total) by mouth at bedtime as needed for sleep (maximum one per day. 11/13/23 fill  date and max monthly).   Allergies: Patient is allergic to belsomra  [suvorexant ]. Family History: Patient family history includes Hypertension in his mother; Lymphoma in his father and another family member; Stroke in an other family member. Social History:  Patient  reports that he quit smoking about 15 years ago. His smoking use included cigarettes. He started smoking about 70 years ago. He has a 55 pack-year smoking history. He has never used smokeless tobacco. He reports that he does not drink alcohol and does not use drugs.  Review of Systems: Constitutional: Negative for fever malaise or anorexia Cardiovascular: negative for chest pain Respiratory: negative for SOB or persistent cough Gastrointestinal: negative for abdominal pain  Objective  Vitals: BP 125/80   Pulse 70   Temp 97.9 F (36.6 C)   Ht 5' 11 (1.803 m)   Wt 158 lb 9.6 oz (71.9 kg)   SpO2 96%   BMI 22.12 kg/m  General: no acute distress , A&Ox3 HEENT:  PEERL, conjunctiva normal, neck is supple, right cervical LAD present, nontender Left tender enlarged parotid gland. No visible drainage from wharton's duct nor palpable stone.   Commons side effects, risks, benefits, and alternatives for medications and treatment plan prescribed today were discussed, and the patient expressed understanding of the given instructions. Patient is instructed to call or message via MyChart if he/she has any questions or concerns regarding our treatment plan. No barriers to understanding were identified. We discussed Red Flag symptoms and signs in detail. Patient expressed understanding regarding what to do in case of urgent or emergency type symptoms.  Medication list was reconciled, printed and provided to the patient in AVS. Patient instructions and summary information was reviewed with the patient as documented in the AVS. This note was prepared with assistance of Dragon voice recognition software. Occasional wrong-word or sound-a-like  substitutions may have occurred due to the inherent limitations of voice recognition software

## 2023-12-03 ENCOUNTER — Ambulatory Visit: Admitting: Family Medicine

## 2023-12-08 ENCOUNTER — Ambulatory Visit (INDEPENDENT_AMBULATORY_CARE_PROVIDER_SITE_OTHER)

## 2023-12-08 ENCOUNTER — Encounter: Payer: Self-pay | Admitting: Family Medicine

## 2023-12-08 ENCOUNTER — Ambulatory Visit: Admitting: Family Medicine

## 2023-12-08 VITALS — BP 128/72 | HR 60 | Temp 97.2°F | Ht 71.0 in | Wt 159.2 lb

## 2023-12-08 DIAGNOSIS — E119 Type 2 diabetes mellitus without complications: Secondary | ICD-10-CM

## 2023-12-08 DIAGNOSIS — Z7901 Long term (current) use of anticoagulants: Secondary | ICD-10-CM

## 2023-12-08 DIAGNOSIS — K1120 Sialoadenitis, unspecified: Secondary | ICD-10-CM | POA: Diagnosis not present

## 2023-12-08 DIAGNOSIS — I4891 Unspecified atrial fibrillation: Secondary | ICD-10-CM

## 2023-12-08 DIAGNOSIS — R600 Localized edema: Secondary | ICD-10-CM | POA: Diagnosis not present

## 2023-12-08 DIAGNOSIS — I10 Essential (primary) hypertension: Secondary | ICD-10-CM

## 2023-12-08 LAB — POCT INR: INR: 2 (ref 2.0–3.0)

## 2023-12-08 MED ORDER — FUROSEMIDE 20 MG PO TABS: 20.0000 mg | ORAL_TABLET | Freq: Every day | ORAL | 1 refills | Status: AC | PRN

## 2023-12-08 MED ORDER — TRIAMTERENE-HCTZ 37.5-25 MG PO TABS: 0.5000 | ORAL_TABLET | Freq: Every day | ORAL | 3 refills | Status: AC

## 2023-12-08 MED ORDER — AMOXICILLIN-POT CLAVULANATE 875-125 MG PO TABS: 1.0000 | ORAL_TABLET | Freq: Two times a day (BID) | ORAL | 0 refills | Status: DC

## 2023-12-08 NOTE — Patient Instructions (Addendum)
 Improving but lets do 1 more week of Augmentin  to be sure then recheck next week- if new or worsening symptoms particularly fever or worsening pain seek care immediately  Recommended follow up: schedule for 11 20 next week (will have to add slot at desk) on Wednesday

## 2023-12-08 NOTE — Patient Instructions (Addendum)
 Pre visit review using our clinic review tool, if applicable. No additional management support is needed unless otherwise documented below in the visit note.  Continue 1 1/2 tablet daily. Recheck in 4 weeks.

## 2023-12-08 NOTE — Progress Notes (Signed)
 Phone [phone] In person visit   Subjective:   William Mahoney is a 89 y.o. year old very pleasant male patient who presents for/with See problem oriented charting Chief Complaint  Patient presents with   Facial Swelling    Lump on left side of face x2 weeks; currently taking keflex ;     Past Medical History-  Patient Active Problem List   Diagnosis Date Noted   Diabetes mellitus type 2, controlled (HCC) 03/24/2023    Priority: High   AAA (abdominal aortic aneurysm) without rupture 11/20/2019    Priority: High   Edema 11/10/2016    Priority: High   Atrial fibrillation (HCC) 12/07/2008    Priority: High   PAD (peripheral artery disease) (HCC) 11/20/2019    Priority: Medium    Chronic kidney disease (CKD), stage III (moderate) (HCC) 02/15/2019    Priority: Medium    Aortic atherosclerosis (HCC) 01/05/2019    Priority: Medium    Insomnia 10/19/2014    Priority: Medium    Gout 01/09/2014    Priority: Medium    History of cancer of rectosigmoid junction 01/04/2008    Priority: Medium    Former smoker 06/23/2007    Priority: Medium    Hyperlipidemia, unspecified 02/09/2007    Priority: Medium    Essential hypertension 12/07/2006    Priority: Medium    COPD (chronic obstructive pulmonary disease) (HCC) 12/07/2006    Priority: Medium    BPH (benign prostatic hyperplasia) 12/07/2006    Priority: Medium    Encounter for therapeutic drug monitoring 08/27/2014    Priority: Low   Allergic rhinitis 08/15/2013    Priority: Low   Arthritis of lumbar spine 01/20/2013    Priority: Low   Inguinal hernia 03/21/2010    Priority: Low   BASAL CELL CARCINOMA SKIN LOWER LIMB INCL HIP 03/21/2010    Priority: Low   Actinic keratosis 12/07/2008    Priority: Low   TMJ (temporomandibular joint disorder) 02/09/2007    Priority: Low   URINARY INCONTINENCE 12/07/2006    Priority: Low   History of digestive system disease 12/07/2006    Priority: Low   Acute metabolic encephalopathy  07/29/2023   Leukocytosis 07/29/2023   Fever 07/29/2023   UTI (urinary tract infection) 07/29/2023   Porokeratosis 11/25/2021    Medications- reviewed and updated Current Outpatient Medications  Medication Sig Dispense Refill   acetaminophen  (TYLENOL ) 500 MG tablet Take 1,000 mg by mouth every 6 (six) hours as needed for moderate pain (pain score 4-6).     albuterol  (VENTOLIN  HFA) 108 (90 Base) MCG/ACT inhaler Inhale 2 puffs into the lungs every 6 (six) hours as needed for wheezing or shortness of breath. 1 each 2   allopurinol  (ZYLOPRIM ) 300 MG tablet TAKE ONE-HALF TABLET BY MOUTH IN THE EVENING 45 tablet 3   amLODipine  (NORVASC ) 5 MG tablet Take 1 tablet (5 mg total) by mouth daily. For blood pressure and possible Raynauds 90 tablet 3   ammonium lactate (LAC-HYDRIN) 12 % lotion Apply topically.     amoxicillin -clavulanate (AUGMENTIN ) 875-125 MG tablet Take 1 tablet by mouth 2 (two) times daily for 7 days. 14 tablet 0   fluocinonide  (LIDEX ) 0.05 % external solution APPLY 1 ML TO AFFECTED AREAS OF SCALP AND CHEST NIGHTLY 60 mL 3   fluticasone  (FLONASE ) 50 MCG/ACT nasal spray Place 2 sprays into both nostrils daily. 48 g 3   ketoconazole  (NIZORAL ) 2 % cream Apply 1 Application topically 2 (two) times daily. For up to 2 weeks 60 g  1   Multiple Vitamin (MULTIVITAMIN) tablet Take 1 tablet by mouth daily.     ofloxacin  (OCUFLOX ) 0.3 % ophthalmic solution SMARTSIG:4 Drop(s) Left Ear Morning-Night     tamsulosin  (FLOMAX ) 0.4 MG CAPS capsule TAKE 1 CAPSULE BY MOUTH EVERY DAY 90 capsule 3   traZODone  (DESYREL ) 50 MG tablet TAKE 0.5-1 TABLETS BY MOUTH AT BEDTIME AS NEEDED FOR SLEEP. 90 tablet 2   warfarin (COUMADIN ) 2.5 MG tablet TAKE 1 TABLET BY MOUTH DAILY OR AS DIRECTED BY ANTICOAGULATION CLINIC 110 tablet 1   zolpidem  (AMBIEN ) 5 MG tablet Take 1 tablet (5 mg total) by mouth at bedtime as needed for sleep (maximum one per day. 11/13/23 fill date and max monthly). 30 tablet 2   furosemide  (LASIX ) 20  MG tablet Take 1 tablet (20 mg total) by mouth daily as needed for edema or fluid. 90 tablet 1   triamterene -hydrochlorothiazide (MAXZIDE-25) 37.5-25 MG tablet Take 0.5 tablets by mouth daily. 45 tablet 3   No current facility-administered medications for this visit.     Objective:  BP 128/72 (BP Location: Left Arm, Patient Position: Sitting, Cuff Size: Normal)   Pulse 60   Temp (!) 97.2 F (36.2 C) (Temporal)   Ht 5' 11 (1.803 m)   Wt 159 lb 3.2 oz (72.2 kg)   SpO2 97%   BMI 22.20 kg/m  Gen: NAD, resting comfortably Swelling throughout left parotid gland but patient reports improved from previous.  No drainage from Stensen's duct even with palpation of parotid gland.  Only mildly tender with palpation over gland.  Oropharynx normal CV: Regular heart rate Lungs: CTAB no crackles, wheeze, rhonchi Ext: no edema Skin: warm, dry    Assessment and Plan    # Left Parotitis S: Patient presented with lump from the ear to the jaw on 12/02/2023 after noting swelling in this area for 2 to 3 days.  No fever or systemic symptoms thankfully.  With tender to palpation but not with mastication.  No visible discharge from the duct or palpable stone on exam with history of salivary gland stones and noted.  He was given Keflex  5 mg twice daily for 7 days and advised to inform Coumadin  clinic.  Advised and compresses for discomfort..  Recommended lemon drops to stimulate salivation and aid in stone passage if present.  Recommend follow-up in 1 week with PCP.  Had Coumadin  checked today and plan was to maintain current dose A/P: Left parotitis appears to be improving with Keflex .  Gland remains somewhat swollen at his age we want to be cautious certainly-we opted to trial another 7 days but switch to Augmentin  after review of up-to-date article-warned of risk of diarrhea and GI upset but he is willing to trial  # Diabetes- new diagnosis based on labs 05/04/22 and 01/11/23 S: Medication:Diet controlled Lab  Results  Component Value Date   HGBA1C 6.5 09/07/2023   HGBA1C 6.6 (H) 05/25/2023   HGBA1C 6.6 (H) 01/11/2023   A/P: Diabetes has been well-controlled-continue without medication   # Atrial fibrillation-follows with Dr. Court of cardiology S: Rate controlled with no Rx Anticoagulated with Coumadin -follows in our clinic A/P: appropriately anticoagulated  on coumadin  with INR at 2 todayand rate controlled (even without prescription)- continue current medicine.  We have reached out to Coumadin  clinic to see if they want to recheck him sooner with start of Augmentin     #hypertension #Venous insufficiency S: medication: Amlodipine  2.5 mg--> 5 mg for help with Raynaud's, triamterene  hydrochlorothiazide 37.5-25 mg half tablet  in the morning, Lasix  20 mg as needed for edema-not needing as much A/P: Hypertension well-controlled-continue current medication.  Honestly with his venous insufficiency would love to reduce amlodipine  but has been beneficial for Raynaud's-continue current medication   Recommended follow up: 1 week follow-up recommended-next Wednesday at 11:20 AM Future Appointments  Date Time Provider Department Center  01/05/2024 11:00 AM [facility]  CLINIC [facility]  02/01/2024  3:00 PM Tobie Eldora NOVAK, MD [facility] None  02/11/2024  2:20 PM Katrinka Garnette KIDD, MD [facility]    Lab/Order associations:   ICD-10-CM   1. Parotitis  K11.20     2. Lower extremity edema  R60.0 furosemide  (LASIX ) 20 MG tablet    3. Controlled type 2 diabetes mellitus without complication, without long-term current use of insulin (HCC)  E11.9     4. Atrial fibrillation, unspecified type (HCC)  I48.91     5. Essential hypertension  I10       Meds ordered this encounter  Medications   amoxicillin -clavulanate (AUGMENTIN ) 875-125 MG tablet    Sig: Take 1 tablet by mouth 2 (two) times daily for 7 days.    Dispense:  14 tablet    Refill:  0   furosemide  (LASIX ) 20 MG tablet    Sig: Take 1  tablet (20 mg total) by mouth daily as needed for edema or fluid.    Dispense:  90 tablet    Refill:  1   triamterene -hydrochlorothiazide (MAXZIDE-25) 37.5-25 MG tablet    Sig: Take 0.5 tablets by mouth daily.    Dispense:  45 tablet    Refill:  3    Return precautions advised.  Garnette Katrinka, MD

## 2023-12-08 NOTE — Progress Notes (Signed)
 Pt was diagnosed with acute parotitis on 8/14 and prescribed Keflex . No interaction with warfarin.  Pt also has apt with PCP today. Continue 1 1/2 tablet daily. Recheck in 4 weeks.

## 2023-12-08 NOTE — Progress Notes (Signed)
 I have reviewed and agree with note, evaluation, plan.   Garnette Lukes, MD

## 2023-12-15 ENCOUNTER — Encounter: Payer: Self-pay | Admitting: Family Medicine

## 2023-12-15 ENCOUNTER — Ambulatory Visit: Admitting: Family Medicine

## 2023-12-15 VITALS — BP 118/68 | HR 57 | Temp 97.2°F | Ht 71.0 in | Wt 166.6 lb

## 2023-12-15 DIAGNOSIS — M542 Cervicalgia: Secondary | ICD-10-CM

## 2023-12-15 DIAGNOSIS — G4700 Insomnia, unspecified: Secondary | ICD-10-CM

## 2023-12-15 DIAGNOSIS — I872 Venous insufficiency (chronic) (peripheral): Secondary | ICD-10-CM | POA: Diagnosis not present

## 2023-12-15 DIAGNOSIS — I4891 Unspecified atrial fibrillation: Secondary | ICD-10-CM

## 2023-12-15 DIAGNOSIS — I7300 Raynaud's syndrome without gangrene: Secondary | ICD-10-CM | POA: Diagnosis not present

## 2023-12-15 DIAGNOSIS — M1A9XX0 Chronic gout, unspecified, without tophus (tophi): Secondary | ICD-10-CM

## 2023-12-15 DIAGNOSIS — K1120 Sialoadenitis, unspecified: Secondary | ICD-10-CM

## 2023-12-15 NOTE — Patient Instructions (Addendum)
 Thrilled you are doing better- call me/reach out if recurrent issues  Verbally discussed trying amlodipine  2.5 mg to see if that helps with swelling as well is taking Lasix  several days in a row to try to reduce swelling and weight gain  Recommended follow up: Return for next already scheduled visit or sooner if needed.

## 2023-12-15 NOTE — Progress Notes (Signed)
 Phone [phone] In person visit   Subjective:   William Mahoney is a 89 y.o. year old very pleasant male patient who presents for/with See problem oriented charting Chief Complaint  Patient presents with   Facial Swelling    Discussed the use of AI scribe software for clinical note transcription with the patient, who gave verbal consent to proceed.  History of Present Illness   William Mahoney is a 89 year old male who presents for follow-up of left parotid gland swelling/parotitis  The swelling in his left parotid gland, located underneath the ear and angle ofjaw, has significantly decreased. Initially, the swelling improved after a seven-day course of Keflex  and further improved after an additional seven days of Augmentin . While the swelling is still present, it is much less tender and has reduced considerably. He completed the antibiotic course yesterday.  He describes persistent achiness on the left side, including the neck and shoulder, which has been a long-standing issue. He uses Tylenol  during the day to manage the discomfort. He has a history of likely arthritis contributing  He experiences scalp irritation affecting the entire scalp, which he manages by washing his hair daily with a medicated shampoo. He is considering reducing the frequency of washing to every other day to allow natural oils to help with the dryness-his daughter is present and encouraged this.  He experiences swelling in his legs, described as two plus edema. His calves sometimes bother him, particularly at night but no pain today or calf swelling. He is currently taking amlodipine , which helps with his hands/raynauds but may contribute to the swelling. He also takes triamterene -hydrochlorothiazide and has Lasix  available as needed with no recent lasix  use.  He has a history of Raynaud's phenomenon, which causes his hands to be 'always ice cold,' especially worsening in the winter. He is currently taking  amlodipine  to manage this condition.  He is on several medications including allopurinol  150 mg for gout prevention, tamsulosin  for bladder issues, trazodone  for sleep, Ambien  for sleep, and Coumadin  for atrial fibrillation. He previously used metoprolol  but has since discontinued it. he reports reasonable control of these issues        Past Medical History-  Patient Active Problem List   Diagnosis Date Noted   Diabetes mellitus type 2, controlled (HCC) 03/24/2023    Priority: High   AAA (abdominal aortic aneurysm) without rupture 11/20/2019    Priority: High   Edema 11/10/2016    Priority: High   Atrial fibrillation (HCC) 12/07/2008    Priority: High   PAD (peripheral artery disease) (HCC) 11/20/2019    Priority: Medium    Chronic kidney disease (CKD), stage III (moderate) (HCC) 02/15/2019    Priority: Medium    Aortic atherosclerosis (HCC) 01/05/2019    Priority: Medium    Insomnia 10/19/2014    Priority: Medium    Gout 01/09/2014    Priority: Medium    History of cancer of rectosigmoid junction 01/04/2008    Priority: Medium    Former smoker 06/23/2007    Priority: Medium    Hyperlipidemia, unspecified 02/09/2007    Priority: Medium    Essential hypertension 12/07/2006    Priority: Medium    COPD (chronic obstructive pulmonary disease) (HCC) 12/07/2006    Priority: Medium    BPH (benign prostatic hyperplasia) 12/07/2006    Priority: Medium    Encounter for therapeutic drug monitoring 08/27/2014    Priority: Low   Allergic rhinitis 08/15/2013    Priority: Low   Arthritis of lumbar  spine 01/20/2013    Priority: Low   Inguinal hernia 03/21/2010    Priority: Low   BASAL CELL CARCINOMA SKIN LOWER LIMB INCL HIP 03/21/2010    Priority: Low   Actinic keratosis 12/07/2008    Priority: Low   TMJ (temporomandibular joint disorder) 02/09/2007    Priority: Low   URINARY INCONTINENCE 12/07/2006    Priority: Low   History of digestive system disease 12/07/2006     Priority: Low   Acute metabolic encephalopathy 07/29/2023   Leukocytosis 07/29/2023   Fever 07/29/2023   UTI (urinary tract infection) 07/29/2023   Porokeratosis 11/25/2021    Medications- reviewed and updated Current Outpatient Medications  Medication Sig Dispense Refill   acetaminophen  (TYLENOL ) 500 MG tablet Take 1,000 mg by mouth every 6 (six) hours as needed for moderate pain (pain score 4-6).     albuterol  (VENTOLIN  HFA) 108 (90 Base) MCG/ACT inhaler Inhale 2 puffs into the lungs every 6 (six) hours as needed for wheezing or shortness of breath. 1 each 2   allopurinol  (ZYLOPRIM ) 300 MG tablet TAKE ONE-HALF TABLET BY MOUTH IN THE EVENING 45 tablet 3   amLODipine  (NORVASC ) 5 MG tablet Take 1 tablet (5 mg total) by mouth daily. For blood pressure and possible Raynauds 90 tablet 3   ammonium lactate (LAC-HYDRIN) 12 % lotion Apply topically.     fluocinonide  (LIDEX ) 0.05 % external solution APPLY 1 ML TO AFFECTED AREAS OF SCALP AND CHEST NIGHTLY 60 mL 3   fluticasone  (FLONASE ) 50 MCG/ACT nasal spray Place 2 sprays into both nostrils daily. 48 g 3   furosemide  (LASIX ) 20 MG tablet Take 1 tablet (20 mg total) by mouth daily as needed for edema or fluid. 90 tablet 1   ketoconazole  (NIZORAL ) 2 % cream Apply 1 Application topically 2 (two) times daily. For up to 2 weeks 60 g 1   Multiple Vitamin (MULTIVITAMIN) tablet Take 1 tablet by mouth daily.     ofloxacin  (OCUFLOX ) 0.3 % ophthalmic solution SMARTSIG:4 Drop(s) Left Ear Morning-Night     tamsulosin  (FLOMAX ) 0.4 MG CAPS capsule TAKE 1 CAPSULE BY MOUTH EVERY DAY 90 capsule 3   traZODone  (DESYREL ) 50 MG tablet TAKE 0.5-1 TABLETS BY MOUTH AT BEDTIME AS NEEDED FOR SLEEP. 90 tablet 2   triamterene -hydrochlorothiazide (MAXZIDE-25) 37.5-25 MG tablet Take 0.5 tablets by mouth daily. 45 tablet 3   warfarin (COUMADIN ) 2.5 MG tablet TAKE 1 TABLET BY MOUTH DAILY OR AS DIRECTED BY ANTICOAGULATION CLINIC 110 tablet 1   zolpidem  (AMBIEN ) 5 MG tablet Take 1  tablet (5 mg total) by mouth at bedtime as needed for sleep (maximum one per day. 11/13/23 fill date and max monthly). 30 tablet 2   No current facility-administered medications for this visit.     Objective:  BP 118/68 (BP Location: Left Arm, Patient Position: Sitting, Cuff Size: Normal)   Pulse (!) 57   Temp (!) 97.2 F (36.2 C) (Temporal)   Ht 5' 11 (1.803 m)   Wt 166 lb 9.6 oz (75.6 kg)   BMI 23.24 kg/m  Gen: NAD, resting comfortably Prior Swelling throughout left parotid gland substantially improved- only mild swelling just under angle of jaw. No longer tender with palpation over gland.   CV: irregularly irregular no murmurs rubs or gallops Lungs: CTAB no crackles, wheeze, rhonchi Ext: 2+ edema Skin: warm, dry    Assessment and Plan   #Left parotitis Significant improvement after completing Keflex  x 7 days and Augmentin  x 7 days (more significant improvement), with residual  mild swelling but decreased tenderness and swelling. - Monitor for recurrence of swelling or pain and return to care if occurs- or at least reach out through [place] - Consider a longer course of antibiotics, such as 10 days, if symptoms recur.  #Chronic venous insufficiency with lower extremity edema Contributing to 2+ edema in the legs. Amlodipine  for Raynaud's phenomenon may exacerbate edema. Blood pressure is stable, allowing for dosage adjustment. - Reduce amlodipine  to half a tablet to assess impact on swelling. not changing on med list yet - Use Lasix  as needed for edema management-may need several days of this with weight up - Continue triamterene /hydrochlorothiazide 37.5/25 mg, half tablet in the morning.  #Raynaud's phenomenon well Managed with amlodipine  5 mg, which helps with hand symptoms but may contribute to lower extremity edema. Symptoms worsen in colder weather. - Reduce amlodipine  to half a tablet and monitor symptoms. - Consider returning to full dose of amlodipine  if hand symptoms  worsen.  #Osteoarthritis of neck and shoulder Causing achiness, particularly on the left side in neck and shoulder - Use Tylenol  for pain management as needed- daughter reports helpful if he does use this  #Atrial fibrillation on anticoagulation Stable on Coumadin  for anticoagulation. No current issues with heart rate control as metoprolol  has been discontinued in past but fortunately heart rate remains controlled. - Continue Coumadin  for anticoagulation.  #Gout Well-managed on allopurinol  150 mg with no recent flares. - Continue allopurinol  150 mg daily.  #Benign prostatic hyperplasia with lower urinary tract symptoms Managed with tamsulosin  0.4 mg daily for urinary symptoms. - Continue tamsulosin  for urinary symptoms.  #Insomnia Well-managed with Ambien  5mg  - doesnt use trazodone  as alternative much. have discussed fall risk and dementia risk in past - Continue Ambien  for sleep management for now  #Scalp dermatitis Scalp appears dry, possibly due to daily hair washing. - Reduce hair washing to every other day to allow natural oils to help with dryness.     Recommended follow up: Return for next already scheduled visit or sooner if needed. Future Appointments  Date Time Provider Department Center  01/05/2024 11:00 AM [facility]  CL[facility]IC [facility]  02/01/2024  3:00 PM Tobie Eldora NOVAK, MD [facility] None  02/11/2024  2:20 PM Katrinka Garnette KIDD, MD [facility]    Lab/Order associations:   ICD-10-CM   1. Parotitis  K11.20     2. Venous insufficiency  I87.2     3. Raynaud's disease without gangrene  I73.00     4. Neck pain  M54.2     5. Atrial fibrillation, unspecified type (HCC)  I48.91     6. Chronic gout without tophus, unspecified cause, unspecified site  [id number]     7. Insomnia, unspecified type  G47.00       No orders of the defined types were placed in this encounter.   Return precautions advised.  Garnette Katrinka, MD

## 2024-01-05 ENCOUNTER — Ambulatory Visit

## 2024-01-06 ENCOUNTER — Other Ambulatory Visit: Payer: Self-pay | Admitting: Family Medicine

## 2024-01-06 NOTE — Telephone Encounter (Signed)
 Copied from CRM [id number]. Topic: Clinical - Medication Refill >> Jan 06, 2024 11:12 AM Thersia C wrote: Medication: zolpidem  (AMBIEN ) 5 MG tablet   no refills, pharmacy was trying to give other sleeping pills that patient can not take because he has a reaction  Has the patient contacted their pharmacy? Yes (Agent: If no, request that the patient contact the pharmacy for the refill. If patient does not wish to contact the pharmacy document the reason why and proceed with request.) (Agent: If yes, when and what did the pharmacy advise?)  This is the patient's preferred pharmacy:  [id number] [address], [place] - [address] [place] [address] Phone: [phone] Fax: [phone]  Is this the correct pharmacy for this prescription? Yes If no, delete pharmacy and type the correct one.   Has the prescription been filled recently? No  Is the patient out of the medication? Yes  Has the patient been seen for an appointment in the last year OR does the patient have an upcoming appointment? Yes  Can we respond through MyChart? Yes  Agent: Please be advised that Rx refills may take up to 3 business days. We ask that you follow-up with your pharmacy.

## 2024-01-19 ENCOUNTER — Ambulatory Visit

## 2024-01-19 DIAGNOSIS — Z7901 Long term (current) use of anticoagulants: Secondary | ICD-10-CM

## 2024-01-19 LAB — POCT INR: INR: 3.6 — AB (ref 2.0–3.0)

## 2024-01-19 NOTE — Progress Notes (Signed)
 I have reviewed and agree with note, evaluation, plan.   Garnette Lukes, MD

## 2024-01-19 NOTE — Patient Instructions (Addendum)
 Pre visit review using our clinic review tool, if applicable. No additional management support is needed unless otherwise documented below in the visit note.  Hold warfarin today and then change weekly dose to take 1 1/2 tablet daily except take 1 tablet on Wednesday. Recheck in 3 weeks.

## 2024-01-19 NOTE — Progress Notes (Signed)
 Pt denies any changes in diet or medications. Pt does report his daughter was recently diagnosed with kidney cancer and is receiving treatment.  Pt denies any s/s of bleeding or abnormal bruising. Advised if any s/s to go to ER. Pt verbalized understanding. Hold warfarin today and then change weekly dose to take 1 1/2 tablet daily except take 1 tablet on Wednesday. Recheck in 3 weeks.

## 2024-02-01 ENCOUNTER — Ambulatory Visit (INDEPENDENT_AMBULATORY_CARE_PROVIDER_SITE_OTHER): Admitting: Otolaryngology

## 2024-02-01 ENCOUNTER — Encounter (INDEPENDENT_AMBULATORY_CARE_PROVIDER_SITE_OTHER): Payer: Self-pay | Admitting: Otolaryngology

## 2024-02-01 VITALS — BP 114/69 | HR 74 | Ht 71.0 in

## 2024-02-01 DIAGNOSIS — M25512 Pain in left shoulder: Secondary | ICD-10-CM | POA: Diagnosis not present

## 2024-02-01 DIAGNOSIS — H7292 Unspecified perforation of tympanic membrane, left ear: Secondary | ICD-10-CM | POA: Diagnosis not present

## 2024-02-01 DIAGNOSIS — H90A32 Mixed conductive and sensorineural hearing loss, unilateral, left ear with restricted hearing on the contralateral side: Secondary | ICD-10-CM

## 2024-02-01 DIAGNOSIS — H9202 Otalgia, left ear: Secondary | ICD-10-CM

## 2024-02-01 DIAGNOSIS — G8929 Other chronic pain: Secondary | ICD-10-CM

## 2024-02-01 MED ORDER — AMOXICILLIN-POT CLAVULANATE 875-125 MG PO TABS: 1.0000 | ORAL_TABLET | Freq: Two times a day (BID) | ORAL | 0 refills | Status: DC

## 2024-02-09 ENCOUNTER — Ambulatory Visit: Admitting: Family Medicine

## 2024-02-09 ENCOUNTER — Encounter: Payer: Self-pay | Admitting: Family Medicine

## 2024-02-09 ENCOUNTER — Ambulatory Visit (INDEPENDENT_AMBULATORY_CARE_PROVIDER_SITE_OTHER)

## 2024-02-09 VITALS — BP 110/62 | HR 72 | Temp 97.6°F | Ht 71.0 in | Wt 166.4 lb

## 2024-02-09 DIAGNOSIS — K1120 Sialoadenitis, unspecified: Secondary | ICD-10-CM | POA: Diagnosis not present

## 2024-02-09 DIAGNOSIS — E119 Type 2 diabetes mellitus without complications: Secondary | ICD-10-CM | POA: Diagnosis not present

## 2024-02-09 DIAGNOSIS — Z7901 Long term (current) use of anticoagulants: Secondary | ICD-10-CM

## 2024-02-09 DIAGNOSIS — I4891 Unspecified atrial fibrillation: Secondary | ICD-10-CM | POA: Diagnosis not present

## 2024-02-09 DIAGNOSIS — I7300 Raynaud's syndrome without gangrene: Secondary | ICD-10-CM

## 2024-02-09 LAB — COMPREHENSIVE METABOLIC PANEL WITH GFR
ALT: 12 U/L (ref 0–53)
AST: 19 U/L (ref 0–37)
Albumin: 4.3 g/dL (ref 3.5–5.2)
Alkaline Phosphatase: 69 U/L (ref 39–117)
BUN: 21 mg/dL (ref 6–23)
CO2: 29 meq/L (ref 19–32)
Calcium: 9.1 mg/dL (ref 8.4–10.5)
Chloride: 102 meq/L (ref 96–112)
Creatinine, Ser: 1.04 mg/dL (ref 0.40–1.50)
GFR: 60.68 mL/min (ref >60.00)
Glucose, Bld: 115 mg/dL — ABNORMAL HIGH (ref 70–99)
Potassium: 4 meq/L (ref 3.5–5.1)
Sodium: 142 meq/L (ref 135–145)
Total Bilirubin: 0.6 mg/dL (ref 0.2–1.2)
Total Protein: 6.3 g/dL (ref 6.0–8.3)

## 2024-02-09 LAB — CBC WITH DIFFERENTIAL/PLATELET
Basophils Absolute: 0 K/uL (ref 0.0–0.1)
Basophils Relative: 0.6 % (ref 0.0–3.0)
Eosinophils Absolute: 0.1 K/uL (ref 0.0–0.7)
Eosinophils Relative: 1.4 % (ref 0.0–5.0)
HCT: 41.8 % (ref 39.0–52.0)
Hemoglobin: 13.5 g/dL (ref 13.0–17.0)
Lymphocytes Relative: 16.2 % (ref 12.0–46.0)
Lymphs Abs: 1 K/uL (ref 0.7–4.0)
MCHC: 32.3 g/dL (ref 30.0–36.0)
MCV: 92.9 fl (ref 78.0–100.0)
Monocytes Absolute: 0.7 K/uL (ref 0.1–1.0)
Monocytes Relative: 10.7 % (ref 3.0–12.0)
Neutro Abs: 4.4 K/uL (ref 1.4–7.7)
Neutrophils Relative %: 71.1 % (ref 43.0–77.0)
Platelets: 139 K/uL — ABNORMAL LOW (ref 150.0–400.0)
RBC: 4.5 Mil/uL (ref 4.22–5.81)
RDW: 14.7 % (ref 11.5–15.5)
WBC: 6.2 K/uL (ref 4.0–10.5)

## 2024-02-09 LAB — POCT INR: INR: 3.1 — AB (ref 2.0–3.0)

## 2024-02-09 LAB — HEMOGLOBIN A1C: Hgb A1c MFr Bld: 6.5 % (ref 4.6–6.5)

## 2024-02-09 MED ORDER — AMOXICILLIN-POT CLAVULANATE 875-125 MG PO TABS: 1.0000 | ORAL_TABLET | Freq: Two times a day (BID) | ORAL | 0 refills | Status: AC

## 2024-02-09 NOTE — Patient Instructions (Addendum)
 Please stop by lab before you go If you have mychart- we will send your results within 3 business days of us  receiving them.  If you do not have mychart- we will call you about results within 5 business days of us  receiving them.  *please also note that you will see labs on mychart as soon as they post. I will later go in and write notes on them- will say notes from Dr. Katrinka   sounds like parotitis may have worsened but looking better and no pain- some residual swelling- he wants to try 5 more days of Augmentin  and this was provied   Recommended follow up: Return in about 2 months (around 04/10/2024) for followup or sooner if needed.Schedule b4 you leave.

## 2024-02-09 NOTE — Progress Notes (Signed)
 Phone [phone] In person visit   Subjective:   William Mahoney is a 89 y.o. year old very pleasant male patient who presents for/with See problem oriented charting Chief Complaint  Patient presents with   Parotitis    Left sided facial pain is getting worse again;    Medical Management of Chronic Issues   Past Medical History-  Patient Active Problem List   Diagnosis Date Noted   Diabetes mellitus type 2, controlled (HCC) 03/24/2023    Priority: High   AAA (abdominal aortic aneurysm) without rupture 11/20/2019    Priority: High   Edema 11/10/2016    Priority: High   Atrial fibrillation (HCC) 12/07/2008    Priority: High   PAD (peripheral artery disease) 11/20/2019    Priority: Medium    Chronic kidney disease (CKD), stage III (moderate) (HCC) 02/15/2019    Priority: Medium    Aortic atherosclerosis 01/05/2019    Priority: Medium    Insomnia 10/19/2014    Priority: Medium    Gout 01/09/2014    Priority: Medium    History of cancer of rectosigmoid junction 01/04/2008    Priority: Medium    Former smoker 06/23/2007    Priority: Medium    Hyperlipidemia, unspecified 02/09/2007    Priority: Medium    Essential hypertension 12/07/2006    Priority: Medium    COPD (chronic obstructive pulmonary disease) (HCC) 12/07/2006    Priority: Medium    BPH (benign prostatic hyperplasia) 12/07/2006    Priority: Medium    Encounter for therapeutic drug monitoring 08/27/2014    Priority: Low   Allergic rhinitis 08/15/2013    Priority: Low   Arthritis of lumbar spine 01/20/2013    Priority: Low   Inguinal hernia 03/21/2010    Priority: Low   BASAL CELL CARCINOMA SKIN LOWER LIMB INCL HIP 03/21/2010    Priority: Low   Actinic keratosis 12/07/2008    Priority: Low   TMJ (temporomandibular joint disorder) 02/09/2007    Priority: Low   URINARY INCONTINENCE 12/07/2006    Priority: Low   History of digestive system disease 12/07/2006    Priority: Low   Acute metabolic  encephalopathy 07/29/2023   Leukocytosis 07/29/2023   Fever 07/29/2023   UTI (urinary tract infection) 07/29/2023   Porokeratosis 11/25/2021    Medications- reviewed and updated Current Outpatient Medications  Medication Sig Dispense Refill   acetaminophen  (TYLENOL ) 500 MG tablet Take 1,000 mg by mouth every 6 (six) hours as needed for moderate pain (pain score 4-6).     albuterol  (VENTOLIN  HFA) 108 (90 Base) MCG/ACT inhaler Inhale 2 puffs into the lungs every 6 (six) hours as needed for wheezing or shortness of breath. 1 each 2   allopurinol  (ZYLOPRIM ) 300 MG tablet TAKE ONE-HALF TABLET BY MOUTH IN THE EVENING 45 tablet 3   amLODipine  (NORVASC ) 5 MG tablet Take 1 tablet (5 mg total) by mouth daily. For blood pressure and possible Raynauds 90 tablet 3   ammonium lactate (LAC-HYDRIN) 12 % lotion Apply topically.     fluocinonide  (LIDEX ) 0.05 % external solution APPLY 1 ML TO AFFECTED AREAS OF SCALP AND CHEST NIGHTLY 60 mL 3   fluticasone  (FLONASE ) 50 MCG/ACT nasal spray Place 2 sprays into both nostrils daily. 48 g 3   furosemide  (LASIX ) 20 MG tablet Take 1 tablet (20 mg total) by mouth daily as needed for edema or fluid. 90 tablet 1   ketoconazole  (NIZORAL ) 2 % cream Apply 1 Application topically 2 (two) times daily. For up to 2  weeks 60 g 1   Multiple Vitamin (MULTIVITAMIN) tablet Take 1 tablet by mouth daily.     ofloxacin  (OCUFLOX ) 0.3 % ophthalmic solution SMARTSIG:4 Drop(s) Left Ear Morning-Night     tamsulosin  (FLOMAX ) 0.4 MG CAPS capsule TAKE 1 CAPSULE BY MOUTH EVERY DAY 90 capsule 3   traZODone  (DESYREL ) 50 MG tablet TAKE 0.5-1 TABLETS BY MOUTH AT BEDTIME AS NEEDED FOR SLEEP. 90 tablet 2   triamterene -hydrochlorothiazide (MAXZIDE-25) 37.5-25 MG tablet Take 0.5 tablets by mouth daily. 45 tablet 3   warfarin (COUMADIN ) 2.5 MG tablet TAKE 1 TABLET BY MOUTH DAILY OR AS DIRECTED BY ANTICOAGULATION CLINIC 110 tablet 1   zolpidem  (AMBIEN ) 5 MG tablet Take 1 tablet (5 mg total) by mouth at  bedtime as needed for sleep (maximum one per day. 11/13/23 fill date and max monthly). 30 tablet 2   amoxicillin -clavulanate (AUGMENTIN ) 875-125 MG tablet Take 1 tablet by mouth 2 (two) times daily for 10 days. 10 tablet 0   No current facility-administered medications for this visit.     Objective:  BP 110/62 (BP Location: Left Arm, Patient Position: Sitting, Cuff Size: Normal)   Pulse 72   Temp 97.6 F (36.4 C) (Temporal)   Ht 5' 11 (1.803 m)   Wt 166 lb 6.4 oz (75.5 kg)   SpO2 99%   BMI 23.21 kg/m  Gen: NAD, resting comfortably TYMPANIC MEMBRANE normal on right, on left- hole with patch noted but appears to have opening.  CV: RRR no murmurs rubs or gallops Lungs: CTAB no crackles, wheeze, rhonchi Ext: 1-2+ edema Skin: warm, dry     Assessment and Plan   # Left-sided parotitis S: At last visit 2 months ago patient had completed a course of Keflex  7 days and later Augmentin  for 7 days with more significant improvement so 2.5 mg instead of 5 mg to see if this would help with leg edema  Most recently on 02/01/2024 he was started back on Augmentin  by Dr. Tobie with ENT for what sounds like ear infection (note pending) - also felt some residual swelling along the parotid - improved but didn't resolve  he reports today- no ear pain but some swelling along angle of jaw  A/P: sounds like parotitis may have worsened but looking better and no pain- some residual swelling- he wants to try 5 more days of Augmentin  and this was provied   # Chronic venous insufficiency with lower extremity edema-last visit we reduced amlodipine  to half tablet.  Also had Lasix  available as needed and was already on triamterene  hydrochlorothiazide half tablet.  We did recognize risk of worsening Raynaud's. He ended up not makign this change- swelling stable  # Diabetes- new diagnosis based on labs 05/04/22 and 01/11/23 S: Medication:Diet controlled Lab Results  Component Value Date   HGBA1C 6.5 09/07/2023    HGBA1C 6.6 (H) 05/25/2023   HGBA1C 6.6 (H) 01/11/2023   A/P: hopefully stable- update a1c today. Continue current meds for now   # Atrial fibrillation-follows with Dr. Court of cardiology S: Rate controlled with no Rx Anticoagulated with Coumadin -follows in our clinic A/P: appropriately anticoagulated and rate controlled- continue current medicine    #hypertension S: medication: Amlodipine  2.5 mg--> 5 mg for help with Raynaud's (did not try lower dose for swelling), triamterene  hydrochlorothiazide 37.5-25 mg half tablet in the morning, Lasix  20 mg as needed for  BP Readings from Last 3 Encounters:  02/09/24 110/62  02/01/24 114/69  12/15/23 118/68  A/P: well controlled continue current medications     #  BPH S: Medication: Tamsulosin  0.4 mg. Reasonable help A/P: stable- continue current medicines     #Insomnia S: Medication: Ambien  5 mg-aware of fall risk - tries to take just half- most of time he reports -belsomra  trial horrible dreams -failed trazodone  in 2019 A/P: would like to take off if possible but he failed other medications we had tried- continue current medications for now     #Gout S: 0 flares in last several on allopurinol  150 mg with colchicine  on hand -Uric acid typically below 6 A/P:doing well- continue to monitor   #Mild thrombocytopenia-ongoing chronic issue but other cell lines normal-continue to monitor at least every 6 months - looked good last time- check again today   Recommended follow up: Return in about 2 months (around 04/10/2024) for followup or sooner if needed.Schedule b4 you leave. Future Appointments  Date Time Provider Department Center  02/09/2024 11:00 AM [facility] COUMAD[facility]  [facility]IC [facility]  05/09/2024  2:00 PM Tobie Eldora NOVAK, MD [facility] None    Lab/Order associations:   ICD-10-CM   1. Diabetes mellitus type 2, controlled (HCC)  E11.9 Comprehensive metabolic panel with GFR    CBC with Differential/Platelet    Hemoglobin A1c     2. Parotitis  K11.20     3. Atrial fibrillation, unspecified type (HCC)  I48.91     4. Raynaud's disease without gangrene  I73.00       Meds ordered this encounter  Medications   amoxicillin -clavulanate (AUGMENTIN ) 875-125 MG tablet    Sig: Take 1 tablet by mouth 2 (two) times daily for 10 days.    Dispense:  10 tablet    Refill:  0    Return precautions advised.  Garnette Lukes, MD

## 2024-02-09 NOTE — Patient Instructions (Addendum)
 Pre visit review using our clinic review tool, if applicable. No additional management support is needed unless otherwise documented below in the visit note.  Eat something green today. Change weekly dose to take 1 1/2 tablet daily except take 1 tablet on Sunday and Wednesday. Recheck in 3 weeks.

## 2024-02-09 NOTE — Progress Notes (Signed)
 Pt also had apt with PCP today. Pt was prescribed Augmentin  on 10/14 for ear infection. Pt had apt with PCP today and Augmentin  was again prescribed. No interaction with warfarin.  Eat something green today. Change weekly dose to take 1 1/2 tablet daily except take 1 tablet on Sunday and Wednesday. Recheck in 3 weeks.

## 2024-02-09 NOTE — Progress Notes (Signed)
 I have reviewed and agree with note, evaluation, plan.   Garnette Lukes, MD

## 2024-02-10 ENCOUNTER — Ambulatory Visit: Payer: Self-pay | Admitting: Family Medicine

## 2024-02-11 ENCOUNTER — Ambulatory Visit: Admitting: Family Medicine

## 2024-02-13 NOTE — Progress Notes (Signed)
 Dear Dr. Katrinka, Here is my assessment for our mutual patient, William Mahoney. Thank you for allowing me the opportunity to care for your patient. Please do not hesitate to contact me should you have any other questions. Sincerely, Dr. Eldora Blanch  Otolaryngology Clinic Note Referring provider: Dr. Katrinka HPI:  William Mahoney is a 89 y.o. male kindly referred by Dr. Katrinka for evaluation of left otitis media and otitis externa.  Initial visit (03/2023): Patient reports: started to have left ear pain and throbbing about a month ago, went to Dr. Wendolyn and then Dr. Katrinka. Dr. Wendolyn cleaned the ear, noted a TM perforation, and prescribed cortisporin drop and omnicef  on 03/16/2023. The ear was irrigated with water. He took the abx but was having diarrhea so stopped, and then saw Dr. Katrinka who prescribed ciprofloxacin  (took them for only 2 days). He has been irrigating the ear with hydrogen peroxide. He reports he is having a dull ache in left ear but better than before and some fullness (some kind of blockage). He currently denies ear drainage. No hearing change, but daughter reports hearing decline Ears are typically not a problem. Q-tip use sometimes. No audiogram prior No throat problems since seeing Dr. Lendon for throat discomfort; some dysphagia which is stable; no mental status changes or other laryngology sx including voice change, other neck masses, throat pain  Patient denies: vertigo, tinnitus Patient additionally denies: eustachian tube symptoms such as popping, crackling, sensitive to pressure changes Patient also denies barotrauma, vestibular suppressant use Prior ear surgery: no --------------------------------------- He was noted to have a left TM perforation, and given prior symptoms we treated him for AOM and seen in follow up.  05/03/2023: Used the drops. He is reportedly doing much better now. Maybe some left ear dull ache, but certainly no pain or throbbing. Keeping ear  dry. No fullness or drainage.  He does now reports that he is having some radiating discomfort from back of neck (unclear, he does not know exactly) to shoulder/deltoid area and now with some right ear discomfort (on the auricle) too. No other issues. No swallowing or throat complaints. Does not hurt to turn the neck. He did have audiogram today ------------------------------------------- Given pain in left deltoid area and small amount of asymmetry of palate, we discussed repeat Abx and he agreed. He returns today for re-check  05/24/2023 He is doing about a same from shoulder standpoint - still has pain mostly over deltoid and distal trapezius area (not posteriorly). He reports ear is doing ok - no significant pain. Keeping ear dry. No fullness, drainage No swallowing or throat complaints. After discussion, he would like to proceed with paper patch to see if it would help.  --------------------------------------------------------- 10/01/2023 He reports that the paper patch helped significantly (felt terrific) - he reports that when he had it, he had improvement in fullness and ear was not painful at all. No swallowing or throat issues. Hearing did not help much. Otherwise recently had cystitis. Continues to have chronic left shoulder pain. No significant ear discomfort. --------------------------------------------------------- 02/01/2024 Returns for follow up. He continues to report that the paper patch he undergoes provides significant relief with ear discomfort for 2-3 weeks. Otherwise, he saw Dr. Katrinka  In August who noted some left facial swelling and diagnosed with parotitis -- prescribed keflex  and augmentin , with significant improvement. He continues to have some shoulder discomfort. He denies ear drainage, vertigo. He also denies neck masses, sore throat, odynophagia, or voice change.  H&N Surgery: denies Personal or  FHx of bleeding dz or anesthesia difficulty: no  AP/AC:  warfarin  Tobacco: quit 1980 (40 pack year history). Alcohol: no. 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 Lives in [place], [place]  PMHx: Diabetes, Dysphagia, A-fib, HTN, Insomnia, CKD, Aortic Aneurysm  Independent Review of Additional Tests or Records:  ENT Dr. Terri  (01/2022):  throat discomfort; began with URI sx mid-sept, rx amoxicillin  with some improvement; left ear pain, and no dysphagia or other problems except for some voice change. Soft palate asymmetry and had CT/MRI; at time of eval, did improve; TFL was normal at that point; TFL showed normal palate at that point and no asymmetry noted. Rx: Clindamycin , celestone; then lost to f/u Ref notes Dr. Katrinka and Dr. Wendolyn (02/2023 and 03/2023): notes reviewed and in chart; left ear pain worsened in Nov, ear clogged, using acetic acid /steroid drops; no drainage; no URI sx; Dx otitis externa, non-supprative otitis media and left TM rupture, Rx: cortisporin; ear irrigated; improved after Dr. Enos Rx, stopped abx Noted some dysphagia (food getting stuck), random; did not want further workup  CBC 03/2023: no leukoctosis; Plt 137 CMP: generally wnl  CT Neck 01/2022 and MRI 01/2022: reviewed independently, agree with left soft palate asymmetry with enhancement along left longus coli and tonsillar fossa; no parotid or parapharyngeal space nodules; no significant retoropharyngeal edema or abscess; no other significant LN noted; mastoids clear; no NP masses noted   05/03/2023 Audiogram was independently reviewed and interpreted by me and I agree with read - AD Type A tymp; left large vol Right ear- Borderline normal to severe sensorineural hearing loss from 250 Hz - 8000 Hz. Left ear-  Mild to severe mixed hearing loss from 250 Hz - 8000 Hz. Right ear: 72% was obtained at a presentation level of 85 dBHL with contralateral masking which is deemed as  fair; Left ear: 64% was obtained at a presentation level of 85 dBHL with contralateral masking  which is deemed as  poor  SNHL= Sensorineural hearing loss  CT Head 04/01/2023: independently reviewed with attention to ears - bilateral ME well aerated; some inferior opacification left mastoid; I do not see significant asymmetry in nasopharynx although left NP slightly more full. No significant mastoid erosion  04/01/2023 CBC no leukocytosis  Dr. Carolee notes (11/2023) reviewed - left parotitis, improved after keflex  and augmentin . Residual mild swelling.   PMH/Meds/All/SocHx/FamHx/ROS:   Past Medical History:  Diagnosis Date   BPH (benign prostatic hypertrophy)    Chronic atrial fibrillation [facility])    cardiologist--   dr berry   COPD (chronic obstructive pulmonary disease) (HCC)    Diverticulosis of colon    MODERATE LEFT SIDE   Epididymal cyst    w/ epididymalitis   Full dentures    Gout    Hand foot syndrome    secondary to chemotherapy (Xeloda)   cold hands/feet   Hearing loss    History of alcohol abuse    quit drinking in the mid 80's   History of cirrhosis of liver    alcoholic--  hx alcohol abuse -- quit drinking 1980's   History of gout    History of melanoma excision    2013--  left ear lobe and back of hand   History of pancreatitis    2008   History of rectal cancer oncologist-  dr cloretta--  no recurrence   dx Sept 2009--  Stage II (T3N0)  s/p  sigmoid colectomy & low anterior resection 04-18-2008  and chemoradiation 2010  History of shingles 07/27/2010   History of squamous cell carcinoma excision    left lower leg   Hyperlipidemia    Hypertension    Loose stools    DUE TO ANTIBIOTICS   Macular degeneration    not sure which eye   OA (osteoarthritis)    RBBB (right bundle branch block)    Renal lesion    chronic-- left side   Sciatica of right side    Urge urinary incontinence    intermittant     Past Surgical History:  Procedure Laterality Date   CARDIAC CATHETERIZATION  2001  approx.  in [place]    CATARACT EXTRACTION W/ INTRAOCULAR LENS   IMPLANT, BILATERAL     COLONOSCOPY  last one 06-27-2012   EXPLORATORY LAPAROTOMY/  LOW ANTERIOR RESECTION/  SIGMOID COLECTOMY  04-18-2008   DR INGRAM   INCISION AND DRAINAGE ABSCESS N/A 11/02/2014   Procedure: INCISION AND DRAINAGE OF SCROTUM;  Surgeon: Morene LELON Salines, MD;  Location[facility];  Service: Urology;  Laterality: N/A;   INGUINAL HERNIA REPAIR Right 03/23/2014   Procedure: HERNIA REPAIR INGUINAL ADULT OPEN REPAIR RIGHT INGUINAL HERNIA REPAIR;  Surgeon: Elon Pacini, MD;  Location: [facility] OR;  Service: General;  Laterality: Right;   INSERTION OF MESH Right 03/23/2014   Procedure: INSERTION OF MESH;  Surgeon: Elon Pacini, MD;  Location: [facility] OR;  Service: General;  Laterality: Right;   LAPAROSCOPIC CHOLECYSTECTOMY  1990's   MASS EXCISION N/A 11/02/2014   Procedure: EXCISION OF EPIDIDYMAL CYST;  Surgeon: Morene LELON Salines, MD;  Location[facility];  Service: Urology;  Laterality: N/A;   TONSILLECTOMY  as child   TRANSTHORACIC ECHOCARDIOGRAM  05-11-2008   pseudonormal LV filling pattern,  ef 60-65%/  mild to moderate MV calcification no stenosis w/ mild to moderate regurg./  mild LAE and RAE/  mild TR    Family History  Problem Relation Age of Onset   Hypertension Mother    Lymphoma Father    Lymphoma Other    Stroke Other        1st degree relative   Colon cancer Neg Hx    Esophageal cancer Neg Hx    Rectal cancer Neg Hx    Stomach cancer Neg Hx      Social Connections: Socially Isolated (12/01/2023)   Social Connection and Isolation Panel    Frequency of Communication with Friends and Family: Once a week    Frequency of Social Gatherings with Friends and Family: Once a week    Attends Religious Services: Never    Database Administrator or Organizations: No    Attends Engineer, Structural: Not on file    Marital Status: Widowed      Current Outpatient Medications:    acetaminophen  (TYLENOL ) 500 MG tablet, Take 1,000 mg by  mouth every 6 (six) hours as needed for moderate pain (pain score 4-6)., Disp: , Rfl:    albuterol  (VENTOLIN  HFA) 108 (90 Base) MCG/ACT inhaler, Inhale 2 puffs into the lungs every 6 (six) hours as needed for wheezing or shortness of breath., Disp: 1 each, Rfl: 2   allopurinol  (ZYLOPRIM ) 300 MG tablet, TAKE ONE-HALF TABLET BY MOUTH IN THE EVENING, Disp: 45 tablet, Rfl: 3   amLODipine  (NORVASC ) 5 MG tablet, Take 1 tablet (5 mg total) by mouth daily. For blood pressure and possible Raynauds, Disp: 90 tablet, Rfl: 3   ammonium lactate (LAC-HYDRIN) 12 % lotion, Apply topically., Disp: , Rfl:  fluocinonide  (LIDEX ) 0.05 % external solution, APPLY 1 ML TO AFFECTED AREAS OF SCALP AND CHEST NIGHTLY, Disp: 60 mL, Rfl: 3   fluticasone  (FLONASE ) 50 MCG/ACT nasal spray, Place 2 sprays into both nostrils daily., Disp: 48 g, Rfl: 3   furosemide  (LASIX ) 20 MG tablet, Take 1 tablet (20 mg total) by mouth daily as needed for edema or fluid., Disp: 90 tablet, Rfl: 1   ketoconazole  (NIZORAL ) 2 % cream, Apply 1 Application topically 2 (two) times daily. For up to 2 weeks, Disp: 60 g, Rfl: 1   Multiple Vitamin (MULTIVITAMIN) tablet, Take 1 tablet by mouth daily., Disp: , Rfl:    ofloxacin  (OCUFLOX ) 0.3 % ophthalmic solution, SMARTSIG:4 Drop(s) Left Ear Morning-Night, Disp: , Rfl:    tamsulosin  (FLOMAX ) 0.4 MG CAPS capsule, TAKE 1 CAPSULE BY MOUTH EVERY DAY, Disp: 90 capsule, Rfl: 3   traZODone  (DESYREL ) 50 MG tablet, TAKE 0.5-1 TABLETS BY MOUTH AT BEDTIME AS NEEDED FOR SLEEP., Disp: 90 tablet, Rfl: 2   triamterene -hydrochlorothiazide (MAXZIDE-25) 37.5-25 MG tablet, Take 0.5 tablets by mouth daily., Disp: 45 tablet, Rfl: 3   warfarin (COUMADIN ) 2.5 MG tablet, TAKE 1 TABLET BY MOUTH DAILY OR AS DIRECTED BY ANTICOAGULATION CLINIC, Disp: 110 tablet, Rfl: 1   zolpidem  (AMBIEN ) 5 MG tablet, Take 1 tablet (5 mg total) by mouth at bedtime as needed for sleep (maximum one per day. 11/13/23 fill date and max monthly)., Disp: 30  tablet, Rfl: 2   amoxicillin -clavulanate (AUGMENTIN ) 875-125 MG tablet, Take 1 tablet by mouth 2 (two) times daily for 10 days., Disp: 10 tablet, Rfl: 0   Physical Exam:   BP 114/69 (BP Location: Right Arm, Patient Position: Sitting, Cuff Size: Large)   Pulse 74   Ht 5' 11 (1.803 m)   SpO2 94%   BMI 23.24 kg/m   Salient findings:  CN II-XII intact - except as below Given history and complaints, bilateral ear microscopy was indicated and performed for evaluation with findings as below in physical exam section and in procedures Right ear: EAC clear and TM intact with well pneumatized middle ear spaces Left ear EAC clear; left TM perforation - ~10% anteroinferior, clean, no debris or drainage. No cholesteatoma or migrating epithelial debris No erythema of ears; no postauricular pain or proptosis or swelling. See procedure note below Weber 512: inconsistent mid, and left Rinne 512: AC > BC b/l  Anterior rhinoscopy: Septum relatively midline; no masses noted No lesions of oral cavity/oropharynx; palate elevation occurs bilaterally, but left is more sluggish than right and there is some mild asymmetry; erythema or tenderness Parotids without masses; palpable tongue base without abnormality Unable to appreciate any tenderness around parotid or swelling; easily expressible saliva both parotid ducts No obviously palpable neck masses/lymphadenopathy/thyromegaly No respiratory distress or stridor  Seprately Identifiable Procedures:  PRE-PROCEDURE DIAGNOSIS: Tympanic membrane perforation left  POST-PROCEDURE DIAGNOSIS: Same  PROCEDURE: Left tympanic membrane repair for perforation with paper patch - CPT 69610-LT  Surgeon(s) and Role: Eldora Blanch, MD   INDICATIONS:  Finlay Godbee is a 89 y.o. male with left tympanic membrane perforation. He has failed a prior paper patch, and we did discuss that given size, he may not heal without a tympanoplasty. He continues to report that his ear is  quite sensitive with significant improvement in sensitivity and otalgia after prior patch. He does not wish to go through myringoplasty with fat (even if in office) and would like to avoid tympanoplasty. We discussed R/B/A for myringoplasty v/s tympanoplasty including the fact that  paper patch would likely not lead to a complete closure. He opted for myringoplasty with paper patch. I did discuss R/B/A including future infections, lack of closure, persistent symptoms and hearing loss among others and patient opted to proceed after verbal consent obtained   FINDINGS:  Left 15% clean perforation anteroinferior; successful paper patch myringoplasty  PROCEDURE DETAILS:  After being properly identified, the patient was placed supine and verbal consent obtained prior to proceeding.   The left ear was inspected under the microscope.  Using a speculum and a curette, mild amount of present cerumen was removed from the external auditory canal.  With the TM fully in view, the edges of the TM perforation were roughed up and freshened with a rasp.  With the edges bleeding, a paper patch was placed over the perforation. Two ciprodex  drops were placed to slightly moisten the patch to promote adherence and a small piece of gelfoam was placed over the paper patch. A cotton ball was then placed in the ear canal.   The patient tolerated the procedure well  COMPLICATIONS:  None apparent  Impression & Plans:  Tudor Chandley is a 89 y.o. male with:  1. Tympanic membrane perforation, left   2. Mixed conductive and sensorineural hearing loss of left ear with restricted hearing of right ear   3. Chronic left shoulder pain   4. Otalgia of left ear    Left ear looks stable and dry; unclear why his fullness improved but he reports his symptoms including ear discomfort continue to be significantly improved after paper patch and he continues to wish for it. We again discussed options: observation, repeat paper patch, v/s can  consider fat graft myringoplasty in office for a more permanent solution. He only wishes for a paper patch and would like to avoid any other procedures  Persistent left shoulder/neck pain. Unclear cause - perhaps musculoskeletal and he's working with ortho for this. He has had prior scans for palate asymmetry (which is mild but persists) and small enhancing area along left longus coli and posterior OP which is I think apparent on TFL prior. He was prior Rx with abx/steroids. I do not know if this would cause this, and we did discuss the persistent asymmetry. In addition, he was diagnosed with parotitis prior and given abx, which did help him. We discussed that although unable to appreciate any significant swelling, given symptoms, we could consider repeating abx course (as I was unable to examine him then) and because he has similar sx currently. Other thing in the DDX would be related to his prior CT findings and we should consider repeat CT but he does not wish for this, despite risk of potential undiagnosed carcinoma.   1) Keep ear dry 2) Agree with musculoskeletal workup for shoulder pain 3) CT declined despite discussion 4) Will empirically rx abx for possible inflammatory cause but if not improving, he needs to come back and we need to obtain CT 5) can consider MRI for asymmetry but given age, I do not think putting him through MRI would be warranted given most Schwannomas slow growing.  F/u 3 months -- unclear why his ear discomfort improves so much with paper patch -- perhaps just sensitivity?  Thank you for allowing me the opportunity to care for your patient. Please do not hesitate to contact me should you have any other questions.  Sincerely, Eldora Blanch, MD Otolaryngologist (ENT), [facility] Phone: [phone] Fax: [phone]  02/13/2024, 12:14 PM   I have  personally spent 30 minutes involved in face-to-face and non-face-to-face activities for this patient on  the day of the visit.  Professional time spent excludes any procedures performed but includes the following activities, in addition to those noted in the documentation: preparing to see the patient (review of outside documentation and results), performing a medically appropriate examination, counseling, ordering medications (augmentin ), documenting in the electronic health record

## 2024-02-24 ENCOUNTER — Other Ambulatory Visit: Payer: Self-pay | Admitting: Family Medicine

## 2024-03-01 ENCOUNTER — Ambulatory Visit (INDEPENDENT_AMBULATORY_CARE_PROVIDER_SITE_OTHER)

## 2024-03-01 DIAGNOSIS — Z7901 Long term (current) use of anticoagulants: Secondary | ICD-10-CM | POA: Diagnosis not present

## 2024-03-01 LAB — POCT INR: INR: 2.1 (ref 2.0–3.0)

## 2024-03-01 NOTE — Progress Notes (Signed)
 Indication: Afib Pt reported today he has been taking 1 1/2 tablets daily since last visit. Dosing was changed at last visit to 1 1/2 tablets daily except take 1 tablet on Sunday and Wednesday. He reports he has always been taking 1 1/2 tablets. Pt is in range today and closer to the low side of his range. Will update calendar to reflect the dosing pt has been taking since pt is in range today. Advised the importance of following dosing instructions. Pt verbalized understanding.  Continue 1 1/2 tablet daily. Recheck in 5 weeks.

## 2024-03-01 NOTE — Patient Instructions (Addendum)
 Pre visit review using our clinic review tool, if applicable. No additional management support is needed unless otherwise documented below in the visit note.  Continue 1 1/2 tablet daily. Recheck in 5 weeks.

## 2024-03-01 NOTE — Progress Notes (Signed)
 I have reviewed and agree with note, evaluation, plan.   Garnette Lukes, MD

## 2024-03-22 ENCOUNTER — Other Ambulatory Visit: Payer: Self-pay | Admitting: Family Medicine

## 2024-03-22 DIAGNOSIS — Z5181 Encounter for therapeutic drug level monitoring: Secondary | ICD-10-CM

## 2024-03-23 ENCOUNTER — Other Ambulatory Visit: Payer: Self-pay | Admitting: Family Medicine

## 2024-03-27 ENCOUNTER — Telehealth: Payer: Self-pay | Admitting: Family Medicine

## 2024-03-27 NOTE — Telephone Encounter (Unsigned)
 Copied from CRM [id number]. Topic: Clinical - Medication Refill >> Mar 27, 2024  9:28 AM Brittany M wrote: Medication: zolpidem  (AMBIEN ) 5 MG tablet  Has the patient contacted their pharmacy? Yes (Agent: If no, request that the patient contact the pharmacy for the refill. If patient does not wish to contact the pharmacy document the reason why and proceed with request.) (Agent: If yes, when and what did the pharmacy advise?)  This is the patient's preferred pharmacy:  [id number] [address], [place] - [address] [place] [address] Phone: [phone] Fax: [phone]  Is this the correct pharmacy for this prescription? Yes If no, delete pharmacy and type the correct one.   Has the prescription been filled recently? Yes  Is the patient out of the medication? Yes  Has the patient been seen for an appointment in the last year OR does the patient have an upcoming appointment? Yes  Can we respond through MyChart? Yes  Agent: Please be advised that Rx refills may take up to 3 business days. We ask that you follow-up with your pharmacy.

## 2024-03-28 MED ORDER — ZOLPIDEM TARTRATE 5 MG PO TABS: 5.0000 mg | ORAL_TABLET | Freq: Every evening | ORAL | 2 refills | Status: AC | PRN

## 2024-03-28 NOTE — Telephone Encounter (Signed)
 Last OV: 02/09/24  Next OV: 04/26/24  Last filled: 11/11/23  Quantity: 30 w/ 2 refills

## 2024-04-05 ENCOUNTER — Ambulatory Visit

## 2024-04-05 DIAGNOSIS — Z7901 Long term (current) use of anticoagulants: Secondary | ICD-10-CM

## 2024-04-05 LAB — POCT INR: INR: 2 (ref 2.0–3.0)

## 2024-04-05 NOTE — Progress Notes (Signed)
 I have reviewed and agree with note, evaluation, plan.   Garnette Lukes, MD

## 2024-04-05 NOTE — Progress Notes (Signed)
 Indication: Afib Continue 1 1/2 tablet daily. Recheck in 6 weeks.

## 2024-04-05 NOTE — Patient Instructions (Addendum)
 Pre visit review using our clinic review tool, if applicable. No additional management support is needed unless otherwise documented below in the visit note.  Continue 1 1/2 tablet daily. Recheck in 6 weeks.

## 2024-04-14 ENCOUNTER — Ambulatory Visit: Payer: Self-pay

## 2024-04-14 NOTE — Telephone Encounter (Signed)
 FYI Only or Action Required?: FYI only for provider: appointment scheduled on 04/18/24.  Patient was last seen in primary care on 02/09/2024 by Katrinka Garnette KIDD, MD.  Called Nurse Triage reporting Sore Throat, Cough, and Nasal Congestion.  Symptoms began yesterday.  Interventions attempted: Nothing.  Symptoms are: gradually worsening.  Triage Disposition: See PCP When Office is Open (Within 3 Days)  Patient/caregiver understands and will follow disposition?: Yes  Reason for Disposition  Lots of coughing  Answer Assessment - Initial Assessment Questions Spoke with patient's daughter, Naomi, who states patient started to have head congestion and sore throat yesterday. Today he is also coughing a lot and she expresses concern for infection. No fevers or SOB. Office visit advised. Soonest available Tuesday 12/30. Advised UC if symptoms worsen.   1. LOCATION: Where does it hurt?      Sinus pressure forehead and behind eyes  2. ONSET: When did the sinus pain start?  (e.g., hours, days)      Yesterday  3. SEVERITY: How bad is the pain?   (Scale 0-10; or none, mild, moderate or severe)     Moderate  4. RECURRENT SYMPTOM: Have you ever had sinus problems before? If Yes, ask: When was the last time? and What happened that time?      Yes, but it's been a while  5. NASAL CONGESTION: Is the nose blocked? If Yes, ask: Can you open it or must you breathe through your mouth?     Yes, able to clear nose  6. NASAL DISCHARGE: Do you have discharge from your nose? If so ask, What color?     Yes, unsure of color  7. FEVER: Do you have a fever? If Yes, ask: What is it, how was it measured, and when did it start?      No  8. OTHER SYMPTOMS: Do you have any other symptoms? (e.g., sore throat, cough, earache, difficulty breathing)     Sore throat, cough  9. PREGNANCY: Is there any chance you are pregnant? When was your last menstrual period?     NA  Protocols  used: Sinus Pain or Congestion-A-AH  Copied from CRM #[id number]. Topic: Clinical - Red Word Triage >> Apr 14, 2024  1:48 PM Brittany M wrote: Red Word that prompted transfer to Nurse Triage: Head cold and sore throat started 12/25 - open sore on jaw bone 2 weeks ago >> Apr 14, 2024  1:52 PM Brittany M wrote: Please call Daughter Allena Raring  [phone]

## 2024-04-18 ENCOUNTER — Other Ambulatory Visit: Payer: Self-pay

## 2024-04-18 ENCOUNTER — Ambulatory Visit: Admitting: Family

## 2024-04-18 ENCOUNTER — Telehealth: Payer: Self-pay | Admitting: Family Medicine

## 2024-04-18 ENCOUNTER — Encounter: Payer: Self-pay | Admitting: Family

## 2024-04-18 VITALS — BP 108/68 | HR 76 | Temp 97.2°F | Resp 16 | Ht 70.0 in | Wt 157.2 lb

## 2024-04-18 DIAGNOSIS — L0211 Cutaneous abscess of neck: Secondary | ICD-10-CM

## 2024-04-18 DIAGNOSIS — R059 Cough, unspecified: Secondary | ICD-10-CM

## 2024-04-18 MED ORDER — ALLOPURINOL 300 MG PO TABS: ORAL_TABLET | ORAL | 3 refills | Status: AC

## 2024-04-18 MED ORDER — AMOXICILLIN-POT CLAVULANATE 875-125 MG PO TABS: 1.0000 | ORAL_TABLET | Freq: Two times a day (BID) | ORAL | 0 refills | Status: DC

## 2024-04-18 NOTE — Progress Notes (Signed)
 "  Patient ID: William Mahoney, male    DOB: February 29, 1926, 89 y.o.   MRN: [id number]  Chief Complaint  Patient presents with   Mass    Says he has a lump on the bottom of his ear. Says the lump has traveled from his jaw to the ear.Noticed it about a month ago.  Discussed the use of AI scribe software for clinical note transcription with the patient, who gave verbal consent to proceed.  History of Present Illness William Mahoney is a 89 year old male who presents with a persistent lump on his neck, near his jaw. He is accompanied by his grandson.  He reports a tender lump along his jawline for about six months. It initially decreased with penicillin but never fully resolved, then recurred and has shifted along the jawline, now localized without drainage. He has completed multiple antibiotic courses (Keflex , Augmentin ), with the last Augmentin  course ending around February 19, 2024 (per review of EMR notes). The lump nearly resolved on antibiotics but returned soon after stopping them, and he has not had notable side effects from the antibiotics. Dermatology and ENT have evaluated him. ENT prescribed Augmentin  and told him there was swelling around a gland and a possible ear infection. He has a dull ear sensation without significant pain. He takes Coumadin  with regular monitoring and has not had issues or been asked to change monitoring while on the antibiotics. He denies ear pain, drainage from the lump, and gastrointestinal side effects from antibiotics.  Assessment & Plan Skin abscess Chronic abscess for six months, started as parotitis, partially resolved with antibiotics, currently tender but not draining, old small scab noted, but pt denies any seeing any drainage, does rub, pick at times. Almost 2 months since last antibiotic. Augmentin  preferred for efficacy and fewer interactions. - Prescribed Augmentin  for 7 days, twice daily, with food. - Advised daily washing with soap and water, avoid  manipulation. - Contact coumadin  nurse, Clotilda, if noting any signs of bleeding while on antibiotic.j - Instructed to monitor for worsening or lack of improvement and contact clinic if needed.  Subjective:    Outpatient Medications Prior to Visit  Medication Sig Dispense Refill   acetaminophen  (TYLENOL ) 500 MG tablet Take 1,000 mg by mouth every 6 (six) hours as needed for moderate pain (pain score 4-6).     albuterol  (VENTOLIN  HFA) 108 (90 Base) MCG/ACT inhaler Inhale 2 puffs into the lungs every 6 (six) hours as needed for wheezing or shortness of breath. 1 each 2   allopurinol  (ZYLOPRIM ) 300 MG tablet TAKE ONE-HALF TABLET BY MOUTH IN THE EVENING 45 tablet 3   amLODipine  (NORVASC ) 5 MG tablet Take 1 tablet (5 mg total) by mouth daily. For blood pressure and possible Raynauds 90 tablet 3   ammonium lactate (LAC-HYDRIN) 12 % lotion Apply topically.     fluocinonide  (LIDEX ) 0.05 % external solution APPLY 1 ML TO AFFECTED AREAS OF SCALP AND CHEST NIGHTLY 60 mL 3   fluticasone  (FLONASE ) 50 MCG/ACT nasal spray Place 2 sprays into both nostrils daily. 48 g 3   furosemide  (LASIX ) 20 MG tablet Take 1 tablet (20 mg total) by mouth daily as needed for edema or fluid. 90 tablet 1   ketoconazole  (NIZORAL ) 2 % cream Apply 1 Application topically 2 (two) times daily. For up to 2 weeks 60 g 1   Multiple Vitamin (MULTIVITAMIN) tablet Take 1 tablet by mouth daily.     tamsulosin  (FLOMAX ) 0.4 MG CAPS capsule TAKE 1 CAPSULE BY  MOUTH EVERY DAY 90 capsule 3   traZODone  (DESYREL ) 50 MG tablet TAKE 0.5-1 TABLETS BY MOUTH AT BEDTIME AS NEEDED FOR SLEEP. 90 tablet 2   triamterene -hydrochlorothiazide (MAXZIDE-25) 37.5-25 MG tablet Take 0.5 tablets by mouth daily. 45 tablet 3   warfarin (COUMADIN ) 2.5 MG tablet TAKE 1 TABLET BY MOUTH DAILY OR AS DIRECTED BY ANTICOAGULATION CLINIC 110 tablet 1   zolpidem  (AMBIEN ) 5 MG tablet Take 1 tablet (5 mg total) by mouth at bedtime as needed for sleep (maximum one per day. one  refill per month max). 30 tablet 2   ofloxacin  (OCUFLOX ) 0.3 % ophthalmic solution SMARTSIG:4 Drop(s) Left Ear Morning-Night (Patient not taking: Reported on 04/18/2024)     No facility-administered medications prior to visit.   Past Medical History:  Diagnosis Date   BPH (benign prostatic hypertrophy)    Chronic atrial fibrillation [facility])    cardiologist--   dr berry   COPD (chronic obstructive pulmonary disease) (HCC)    Diverticulosis of colon    MODERATE LEFT SIDE   Epididymal cyst    w/ epididymalitis   Full dentures    Gout    Hand foot syndrome    secondary to chemotherapy (Xeloda)   cold hands/feet   Hearing loss    History of alcohol abuse    quit drinking in the mid 80's   History of cirrhosis of liver    alcoholic--  hx alcohol abuse -- quit drinking 1980's   History of gout    History of melanoma excision    2013--  left ear lobe and back of hand   History of pancreatitis    2008   History of rectal cancer oncologist-  dr cloretta--  no recurrence   dx Sept 2009--  Stage II (T3N0)  s/p  sigmoid colectomy & low anterior resection 04-18-2008  and chemoradiation 2010   History of shingles 07/27/2010   History of squamous cell carcinoma excision    left lower leg   Hyperlipidemia    Hypertension    Loose stools    DUE TO ANTIBIOTICS   Macular degeneration    not sure which eye   OA (osteoarthritis)    RBBB (right bundle branch block)    Renal lesion    chronic-- left side   Sciatica of right side    Urge urinary incontinence    intermittant   Past Surgical History:  Procedure Laterality Date   CARDIAC CATHETERIZATION  2001  approx.  in [place]    CATARACT EXTRACTION W/ INTRAOCULAR LENS  IMPLANT, BILATERAL     COLONOSCOPY  last one 06-27-2012   EXPLORATORY LAPAROTOMY/  LOW ANTERIOR RESECTION/  SIGMOID COLECTOMY  04-18-2008   DR INGRAM   INCISION AND DRAINAGE ABSCESS N/A 11/02/2014   Procedure: INCISION AND DRAINAGE OF SCROTUM;  Surgeon: Morene LELON Salines,  MD;  Location: [facility];  Service: Urology;  Laterality: N/A;   INGUINAL HERNIA REPAIR Right 03/23/2014   Procedure: HERNIA REPAIR INGUINAL ADULT OPEN REPAIR RIGHT INGUINAL HERNIA REPAIR;  Surgeon: Elon Pacini, MD;  Location: [facility] OR;  Service: General;  Laterality: Right;   INSERTION OF MESH Right 03/23/2014   Procedure: INSERTION OF MESH;  Surgeon: Elon Pacini, MD;  Location: [facility] OR;  Service: General;  Laterality: Right;   LAPAROSCOPIC CHOLECYSTECTOMY  1990's   MASS EXCISION N/A 11/02/2014   Procedure: EXCISION OF EPIDIDYMAL CYST;  Surgeon: Morene LELON Salines, MD;  Location[facility];  Service: Urology;  Laterality: N/A;  TONSILLECTOMY  as child   TRANSTHORACIC ECHOCARDIOGRAM  05-11-2008   pseudonormal LV filling pattern,  ef 60-65%/  mild to moderate MV calcification no stenosis w/ mild to moderate regurg./  mild LAE and RAE/  mild TR   Allergies[1]    Objective:    Physical Exam Vitals and nursing note reviewed.  Constitutional:      General: He is not in acute distress.    Appearance: Normal appearance.  HENT:     Head: Normocephalic.     Right Ear: Tympanic membrane and ear canal normal.     Left Ear: Tympanic membrane and ear canal normal.     Nose:     Right Sinus: Frontal sinus tenderness present. No maxillary sinus tenderness.     Left Sinus: Frontal sinus tenderness present. No maxillary sinus tenderness.     Mouth/Throat:     Mouth: Mucous membranes are moist.     Pharynx: No pharyngeal swelling, oropharyngeal exudate, posterior oropharyngeal erythema or uvula swelling.     Tonsils: No tonsillar exudate or tonsillar abscesses.  Cardiovascular:     Rate and Rhythm: Normal rate and regular rhythm.  Pulmonary:     Effort: Pulmonary effort is normal.     Breath sounds: Normal breath sounds.  Musculoskeletal:        General: Normal range of motion.     Cervical back: Normal range of motion.  Lymphadenopathy:     Head:     Right  side of head: No preauricular or posterior auricular adenopathy.     Left side of head: No preauricular or posterior auricular adenopathy.     Cervical: Cervical adenopathy present.     Right cervical: Superficial cervical adenopathy present.     Left cervical: Superficial cervical adenopathy present.  Skin:    General: Skin is warm and dry.     Findings: Abscess (left neck, just under jaw, approx 3cm in length, 1.5cm in width, no erythema, tenderness w/palpation, firm, dry skin patch noted and small scab; see picture.) present.  Neurological:     Mental Status: He is alert and oriented to person, place, and time.  Psychiatric:        Mood and Affect: Mood normal.     BP 108/68   Pulse 76   Temp (!) 97.2 F (36.2 C) (Temporal)   Resp 16   Ht 5' 10 (1.778 m)   Wt 157 lb 3.2 oz (71.3 kg)   SpO2 96%   BMI 22.56 kg/m  Wt Readings from Last 3 Encounters:  04/18/24 157 lb 3.2 oz (71.3 kg)  02/09/24 166 lb 6.4 oz (75.5 kg)  12/15/23 166 lb 9.6 oz (75.6 kg)      Juliahna Wiswell, NP     [1]  Allergies Allergen Reactions   Belsomra  [Suvorexant ] Other (See Comments)    Hallucinations    "

## 2024-04-18 NOTE — Telephone Encounter (Signed)
" °  Encourage patient to contact the pharmacy for refills or they can request refills through [facility]  LAST APPOINTMENT DATE:  02/09/24  NEXT APPOINTMENT DATE: 04/26/24  MEDICATION: allopurinol  (ZYLOPRIM ) 300 MG tablet   Is the patient out of medication?   PHARMACY:  [id number] [place], [place] - [address] Phone: [phone]  Fax: [phone]      Let patient know to contact pharmacy at the end of the day to make sure medication is ready.  Please notify patient to allow 48-72 hours to process  "

## 2024-04-26 ENCOUNTER — Encounter: Payer: Self-pay | Admitting: Family Medicine

## 2024-04-26 ENCOUNTER — Ambulatory Visit: Admitting: Family Medicine

## 2024-04-26 VITALS — BP 108/70 | HR 69 | Temp 98.2°F | Ht 70.0 in | Wt 155.0 lb

## 2024-04-26 DIAGNOSIS — E1122 Type 2 diabetes mellitus with diabetic chronic kidney disease: Secondary | ICD-10-CM | POA: Diagnosis not present

## 2024-04-26 DIAGNOSIS — I4891 Unspecified atrial fibrillation: Secondary | ICD-10-CM

## 2024-04-26 DIAGNOSIS — N1831 Chronic kidney disease, stage 3a: Secondary | ICD-10-CM | POA: Diagnosis not present

## 2024-04-26 DIAGNOSIS — L0211 Cutaneous abscess of neck: Secondary | ICD-10-CM | POA: Diagnosis not present

## 2024-04-26 DIAGNOSIS — J449 Chronic obstructive pulmonary disease, unspecified: Secondary | ICD-10-CM

## 2024-04-26 NOTE — Patient Instructions (Addendum)
 Bring all medicines to next visit  No labs today- next visit will plan on  Do not pick at those skin areas- it is going to make them worse! Keep it moist with antibiotic topical- if worsens please see us  back or if other side worsens see us  back but looks stable to improving for now  Recommended follow up: Return in about 2 months (around 06/24/2024) for followup or sooner if needed.Schedule b4 you leave.

## 2024-04-26 NOTE — Progress Notes (Signed)
 " Phone [phone] In person visit   Subjective:   William Mahoney is a 89 y.o. year old very pleasant male patient who presents for/with See problem oriented charting Chief Complaint  Patient presents with   Medical Management of Chronic Issues    2 month follow up; would like to talk about lump on side of neck and his ears; they have noticed more lower extremity swelling lately; concerned about flaky skin and bumps on scalp;     Past Medical History-  Patient Active Problem List   Diagnosis Date Noted   Diabetes mellitus type 2, controlled (HCC) 03/24/2023    Priority: High   AAA (abdominal aortic aneurysm) without rupture 11/20/2019    Priority: High   Edema 11/10/2016    Priority: High   Atrial fibrillation (HCC) 12/07/2008    Priority: High   PAD (peripheral artery disease) 11/20/2019    Priority: Medium    Chronic kidney disease (CKD), stage III (moderate) (HCC) 02/15/2019    Priority: Medium    Aortic atherosclerosis 01/05/2019    Priority: Medium    Insomnia 10/19/2014    Priority: Medium    Gout 01/09/2014    Priority: Medium    History of cancer of rectosigmoid junction 01/04/2008    Priority: Medium    Former smoker 06/23/2007    Priority: Medium    Hyperlipidemia, unspecified 02/09/2007    Priority: Medium    Essential hypertension 12/07/2006    Priority: Medium    COPD (chronic obstructive pulmonary disease) (HCC) 12/07/2006    Priority: Medium    BPH (benign prostatic hyperplasia) 12/07/2006    Priority: Medium    Encounter for therapeutic drug monitoring 08/27/2014    Priority: Low   Allergic rhinitis 08/15/2013    Priority: Low   Arthritis of lumbar spine 01/20/2013    Priority: Low   Inguinal hernia 03/21/2010    Priority: Low   BASAL CELL CARCINOMA SKIN LOWER LIMB INCL HIP 03/21/2010    Priority: Low   Actinic keratosis 12/07/2008    Priority: Low   TMJ (temporomandibular joint disorder) 02/09/2007    Priority: Low   URINARY INCONTINENCE  12/07/2006    Priority: Low   History of digestive system disease 12/07/2006    Priority: Low   Type 2 diabetes mellitus with stage 3a chronic kidney disease, without long-term current use of insulin (HCC) 04/26/2024   Acute metabolic encephalopathy 07/29/2023   Leukocytosis 07/29/2023   Fever 07/29/2023   UTI (urinary tract infection) 07/29/2023   Porokeratosis 11/25/2021    Medications- reviewed and updated Current Outpatient Medications  Medication Sig Dispense Refill   acetaminophen  (TYLENOL ) 500 MG tablet Take 1,000 mg by mouth every 6 (six) hours as needed for moderate pain (pain score 4-6).     albuterol  (VENTOLIN  HFA) 108 (90 Base) MCG/ACT inhaler Inhale 2 puffs into the lungs every 6 (six) hours as needed for wheezing or shortness of breath. 1 each 2   allopurinol  (ZYLOPRIM ) 300 MG tablet TAKE ONE-HALF TABLET BY MOUTH IN THE EVENING 45 tablet 3   amLODipine  (NORVASC ) 5 MG tablet Take 1 tablet (5 mg total) by mouth daily. For blood pressure and possible Raynauds 90 tablet 3   ammonium lactate (LAC-HYDRIN) 12 % lotion Apply topically.     fluocinonide  (LIDEX ) 0.05 % external solution APPLY 1 ML TO AFFECTED AREAS OF SCALP AND CHEST NIGHTLY 60 mL 3   fluticasone  (FLONASE ) 50 MCG/ACT nasal spray Place 2 sprays into both nostrils daily. 48 g 3  furosemide  (LASIX ) 20 MG tablet Take 1 tablet (20 mg total) by mouth daily as needed for edema or fluid. 90 tablet 1   ketoconazole  (NIZORAL ) 2 % cream Apply 1 Application topically 2 (two) times daily. For up to 2 weeks 60 g 1   Multiple Vitamin (MULTIVITAMIN) tablet Take 1 tablet by mouth daily.     tamsulosin  (FLOMAX ) 0.4 MG CAPS capsule TAKE 1 CAPSULE BY MOUTH EVERY DAY 90 capsule 3   traZODone  (DESYREL ) 50 MG tablet TAKE 0.5-1 TABLETS BY MOUTH AT BEDTIME AS NEEDED FOR SLEEP. 90 tablet 2   triamterene -hydrochlorothiazide (MAXZIDE-25) 37.5-25 MG tablet Take 0.5 tablets by mouth daily. 45 tablet 3   warfarin (COUMADIN ) 2.5 MG tablet TAKE 1  TABLET BY MOUTH DAILY OR AS DIRECTED BY ANTICOAGULATION CLINIC 110 tablet 1   zolpidem  (AMBIEN ) 5 MG tablet Take 1 tablet (5 mg total) by mouth at bedtime as needed for sleep (maximum one per day. one refill per month max). 30 tablet 2   No current facility-administered medications for this visit.     Objective:  BP 108/70 (BP Location: Left Arm, Patient Position: Sitting, Cuff Size: Normal)   Pulse 69   Temp 98.2 F (36.8 C) (Temporal)   Ht 5' 10 (1.778 m)   Wt 155 lb (70.3 kg)   SpO2 94%   BMI 22.24 kg/m  Gen: NAD, resting comfortably CV: RRR no murmurs rubs or gallops Lungs: CTAB no crackles, wheeze, rhonchi Ext: trace edema Skin: warm, dry     Assessment and Plan   #weight loss- edema down - don't want him to lose anymore weight though- may need further workup if continues to lose. Suspect daughter's illness and stress from that could contribute. Son brought him today. She has urethral carcinoma that is very aggressive.   # Abscess neck S: Patient with history of peritonitis requiring antibiotics-was seen on 04/18/2024 as he felt like may be gradually worsening over the last month-was placed on Augmentin  twice daily for 7 days by one of my colleagues and I was out of the office A/P: was along jawline- appears to have healed from infection perspective- still some induration which suspect will gradually go down- if new or worsening symptom(s) return to see us   - behind his right ear feel very small likely lymph node and he's going to monitor that as well   # Skin changes-more flaky skin and several bumps on scalp- appears to have some extensive skin damage- he is going to ask dermatology   # Diabetes- new diagnosis based on labs 05/04/22 and 01/11/23 S: Medication:Diet controlled Lab Results  Component Value Date   HGBA1C 6.5 02/09/2024   HGBA1C 6.5 09/07/2023   HGBA1C 6.6 (H) 05/25/2023   A/P: too soon for repeat but ahs been controlled- continue current medications   #  Atrial fibrillation-follows with Dr. Court of cardiology S: Rate controlled with no Rx Anticoagulated with Coumadin -follows in our clinic -eliquis  $400 a month in 2025 A/P: rate controlled without medicine. Appropriately anticoagulated with coumadin    #Aortic aneurysm-continues to have at least yearly checks most recently October 2023 with annual recheck planned  -Thankfully stable on CT chest abdomen pelvis December 2024 -We offered updated ultrasound AAA today- with his daughter situation we opted to wait until next visit to order this - if we do a CT hopefully they can also look at the lung nodule he has had    #hypertension #Venous insufficiency S: medication: Amlodipine  2.5 mg--> 5 mg for help  with Raynaud's, triamterene  hydrochlorothiazide 37.5-25 mg half tablet in the morning, Lasix  20 mg as needed for edema- taking pretty regularly but controlling swelling BP Readings from Last 3 Encounters:  04/26/24 108/70  04/18/24 108/68  02/09/24 110/62  A/P: blood pressure well controlled continue current medications  Venous insuffiencey stable    #BPH S: Medication: Tamsulosin  0.4 mg A/P: reasonable control - continue current medications     #Insomnia S: Medication: Ambien  5 mg-aware of fall risk - tries to take just half -belsomra  trial horrible dreams -failed trazodone  in 2019 retrial last year but he reverted to ambien  A/P: imperfect control but wants to continue current regimen- thankfully no falls    #Gout S: no flares recently on allopurinol  150 mg with colchicine  on hand -Uric acid typically below 6 A/P:doing well- continue current medications     #CKD III- #s actually slightly better lats visit but usually GFR in 40's or 50's- continue to monitor- not due today  #Mild thrombocytopenia-ongoing chronic issue but other cell lines normal-continue to monitor at least every 6 months- opted for next visit  -Pathology smear review reassuring 02/20/2022  #COPD- no shortness of  breath or wheeze. Some mild intermittent nighttime cough if throat gets dry- will monitor  Recommended follow up: Return in about 2 months (around 06/24/2024) for followup or sooner if needed.Schedule b4 you leave. Future Appointments  Date Time Provider Department Center  05/09/2024  2:00 PM Tobie Eldora NOVAK, MD [facility] None  05/17/2024 11:00 AM [facility] COUMADIN  CLINIC [facility]    Lab/Order associations:   ICD-10-CM   1. Type 2 diabetes mellitus with stage 3a chronic kidney disease, without long-term current use of insulin (HCC)  E11.22    N18.31     2. Chronic obstructive pulmonary disease, unspecified COPD type (HCC)  J44.9     3. Atrial fibrillation, unspecified type (HCC)  I48.91     4. Stage 3a chronic kidney disease (HCC)  N18.31       No orders of the defined types were placed in this encounter.   Return precautions advised.  Garnette Lukes, MD  "

## 2024-05-09 ENCOUNTER — Ambulatory Visit (INDEPENDENT_AMBULATORY_CARE_PROVIDER_SITE_OTHER): Admitting: Otolaryngology

## 2024-05-17 ENCOUNTER — Ambulatory Visit

## 2024-05-17 ENCOUNTER — Telehealth: Payer: Self-pay

## 2024-05-17 NOTE — Telephone Encounter (Signed)
 Pt NS coumadin  clinic apt today. Contacted pt and he reports he forgot and also reported he lost his daughter last week to cancer. Offered pt condolences. RS coumadin  clinic apt. Advised if anything is needed to contact the coumadin  clinic. Pt verbalized understanding and was appreciative of the call.

## 2024-05-18 NOTE — Telephone Encounter (Signed)
 LVM with condolences.

## 2024-05-24 ENCOUNTER — Ambulatory Visit

## 2024-05-24 DIAGNOSIS — Z7901 Long term (current) use of anticoagulants: Secondary | ICD-10-CM

## 2024-05-24 LAB — POCT INR: INR: 2.6 (ref 2.0–3.0)

## 2024-05-24 NOTE — Progress Notes (Signed)
 I have reviewed and agree with note, evaluation, plan.   Garnette Lukes, MD

## 2024-05-24 NOTE — Patient Instructions (Addendum)
 Pre visit review using our clinic review tool, if applicable. No additional management support is needed unless otherwise documented below in the visit note.  Continue 1 1/2 tablet daily. Recheck in 6 weeks.

## 2024-05-24 NOTE — Progress Notes (Signed)
 Indication: Afib Pt lost his daughter to cancer recently. Continue 1 1/2 tablet daily. Recheck in 6 weeks.

## 2024-06-01 ENCOUNTER — Ambulatory Visit: Admitting: Family Medicine

## 2024-06-26 ENCOUNTER — Ambulatory Visit: Admitting: Family Medicine

## 2024-07-05 ENCOUNTER — Ambulatory Visit
